# Patient Record
Sex: Female | Born: 1955 | Race: Black or African American | Hispanic: No | Marital: Single | Smoking: Never smoker
Health system: Southern US, Community
[De-identification: ages and names within clinical notes are randomized; demographics above are authoritative.]

## PROBLEM LIST (undated history)

## (undated) DIAGNOSIS — M1990 Unspecified osteoarthritis, unspecified site: Secondary | ICD-10-CM

## (undated) DIAGNOSIS — I10 Essential (primary) hypertension: Secondary | ICD-10-CM

## (undated) DIAGNOSIS — E119 Type 2 diabetes mellitus without complications: Secondary | ICD-10-CM

## (undated) HISTORY — PX: BREAST BIOPSY: SHX20

## (undated) HISTORY — PX: EYE SURGERY: SHX253

## (undated) HISTORY — PX: CHOLECYSTECTOMY: SHX55

## (undated) HISTORY — PX: HERNIA REPAIR: SHX51

---

## 1998-07-25 ENCOUNTER — Other Ambulatory Visit: Admission: RE | Admit: 1998-07-25 | Discharge: 1998-07-25 | Payer: Self-pay | Admitting: Obstetrics & Gynecology

## 2005-06-15 ENCOUNTER — Other Ambulatory Visit: Admission: RE | Admit: 2005-06-15 | Discharge: 2005-06-15 | Payer: Self-pay | Admitting: Internal Medicine

## 2006-06-17 ENCOUNTER — Other Ambulatory Visit: Admission: RE | Admit: 2006-06-17 | Discharge: 2006-06-17 | Payer: Self-pay | Admitting: Internal Medicine

## 2006-12-13 ENCOUNTER — Encounter: Admission: RE | Admit: 2006-12-13 | Discharge: 2006-12-13 | Payer: Self-pay | Admitting: Internal Medicine

## 2007-06-22 ENCOUNTER — Encounter: Admission: RE | Admit: 2007-06-22 | Discharge: 2007-06-22 | Payer: Self-pay | Admitting: Internal Medicine

## 2007-07-11 ENCOUNTER — Other Ambulatory Visit: Admission: RE | Admit: 2007-07-11 | Discharge: 2007-07-11 | Payer: Self-pay | Admitting: Internal Medicine

## 2008-06-25 ENCOUNTER — Encounter: Admission: RE | Admit: 2008-06-25 | Discharge: 2008-06-25 | Payer: Self-pay | Admitting: Internal Medicine

## 2008-07-11 ENCOUNTER — Other Ambulatory Visit: Admission: RE | Admit: 2008-07-11 | Discharge: 2008-07-11 | Payer: Self-pay | Admitting: Internal Medicine

## 2009-06-26 ENCOUNTER — Encounter: Admission: RE | Admit: 2009-06-26 | Discharge: 2009-06-26 | Payer: Self-pay | Admitting: Internal Medicine

## 2010-07-03 ENCOUNTER — Encounter: Admission: RE | Admit: 2010-07-03 | Discharge: 2010-07-03 | Payer: Self-pay | Admitting: Internal Medicine

## 2010-07-22 ENCOUNTER — Other Ambulatory Visit: Admission: RE | Admit: 2010-07-22 | Discharge: 2010-07-22 | Payer: Self-pay | Admitting: Internal Medicine

## 2011-05-16 ENCOUNTER — Inpatient Hospital Stay (INDEPENDENT_AMBULATORY_CARE_PROVIDER_SITE_OTHER)
Admission: RE | Admit: 2011-05-16 | Discharge: 2011-05-16 | Disposition: A | Discharge status: Home / Self Care | Payer: Self-pay | Source: Ambulatory Visit | Attending: Emergency Medicine | Admitting: Emergency Medicine

## 2011-05-16 ENCOUNTER — Ambulatory Visit (INDEPENDENT_AMBULATORY_CARE_PROVIDER_SITE_OTHER): Payer: Self-pay

## 2011-05-16 DIAGNOSIS — S7000XA Contusion of unspecified hip, initial encounter: Secondary | ICD-10-CM

## 2011-05-16 DIAGNOSIS — W19XXXA Unspecified fall, initial encounter: Secondary | ICD-10-CM

## 2011-06-22 ENCOUNTER — Other Ambulatory Visit: Payer: Self-pay | Admitting: Internal Medicine

## 2011-06-22 DIAGNOSIS — Z1231 Encounter for screening mammogram for malignant neoplasm of breast: Secondary | ICD-10-CM

## 2011-07-28 ENCOUNTER — Ambulatory Visit
Admission: RE | Admit: 2011-07-28 | Discharge: 2011-07-28 | Disposition: A | Discharge status: Home / Self Care | Payer: 59 | Source: Ambulatory Visit | Attending: Internal Medicine | Admitting: Internal Medicine

## 2011-07-28 DIAGNOSIS — Z1231 Encounter for screening mammogram for malignant neoplasm of breast: Secondary | ICD-10-CM

## 2012-06-19 ENCOUNTER — Other Ambulatory Visit: Payer: Self-pay | Admitting: Internal Medicine

## 2012-06-19 DIAGNOSIS — Z1231 Encounter for screening mammogram for malignant neoplasm of breast: Secondary | ICD-10-CM

## 2012-07-28 ENCOUNTER — Ambulatory Visit
Admission: RE | Admit: 2012-07-28 | Discharge: 2012-07-28 | Disposition: A | Discharge status: Home / Self Care | Payer: 59 | Source: Ambulatory Visit | Attending: Internal Medicine | Admitting: Internal Medicine

## 2012-07-28 DIAGNOSIS — Z1231 Encounter for screening mammogram for malignant neoplasm of breast: Secondary | ICD-10-CM

## 2012-08-17 ENCOUNTER — Other Ambulatory Visit: Payer: Self-pay | Admitting: Internal Medicine

## 2012-08-17 DIAGNOSIS — R928 Other abnormal and inconclusive findings on diagnostic imaging of breast: Secondary | ICD-10-CM

## 2012-08-25 ENCOUNTER — Other Ambulatory Visit: Payer: Self-pay | Admitting: Internal Medicine

## 2012-08-25 ENCOUNTER — Ambulatory Visit
Admission: RE | Admit: 2012-08-25 | Discharge: 2012-08-25 | Disposition: A | Discharge status: Home / Self Care | Payer: 59 | Source: Ambulatory Visit | Attending: Internal Medicine | Admitting: Internal Medicine

## 2012-08-25 DIAGNOSIS — R928 Other abnormal and inconclusive findings on diagnostic imaging of breast: Secondary | ICD-10-CM

## 2012-09-07 ENCOUNTER — Ambulatory Visit
Admission: RE | Admit: 2012-09-07 | Discharge: 2012-09-07 | Disposition: A | Discharge status: Home / Self Care | Payer: 59 | Source: Ambulatory Visit | Attending: Internal Medicine | Admitting: Internal Medicine

## 2012-09-07 ENCOUNTER — Other Ambulatory Visit: Payer: Self-pay | Admitting: Diagnostic Radiology

## 2012-09-07 DIAGNOSIS — R928 Other abnormal and inconclusive findings on diagnostic imaging of breast: Secondary | ICD-10-CM

## 2013-07-31 ENCOUNTER — Other Ambulatory Visit: Payer: Self-pay

## 2013-07-31 DIAGNOSIS — Z1231 Encounter for screening mammogram for malignant neoplasm of breast: Secondary | ICD-10-CM

## 2013-09-03 ENCOUNTER — Ambulatory Visit
Admission: RE | Admit: 2013-09-03 | Discharge: 2013-09-03 | Disposition: A | Discharge status: Home / Self Care | Payer: 59 | Source: Ambulatory Visit

## 2013-09-03 ENCOUNTER — Other Ambulatory Visit: Payer: Self-pay | Admitting: Internal Medicine

## 2013-09-03 ENCOUNTER — Other Ambulatory Visit (HOSPITAL_COMMUNITY)
Admission: RE | Admit: 2013-09-03 | Discharge: 2013-09-03 | Disposition: A | Discharge status: Home / Self Care | Payer: 59 | Source: Ambulatory Visit | Attending: Internal Medicine | Admitting: Internal Medicine

## 2013-09-03 DIAGNOSIS — Z1231 Encounter for screening mammogram for malignant neoplasm of breast: Secondary | ICD-10-CM

## 2013-09-03 DIAGNOSIS — Z01419 Encounter for gynecological examination (general) (routine) without abnormal findings: Secondary | ICD-10-CM | POA: Insufficient documentation

## 2013-09-03 DIAGNOSIS — Z1151 Encounter for screening for human papillomavirus (HPV): Secondary | ICD-10-CM | POA: Insufficient documentation

## 2013-09-04 ENCOUNTER — Ambulatory Visit: Payer: 59

## 2014-08-08 ENCOUNTER — Other Ambulatory Visit: Payer: Self-pay

## 2014-08-08 DIAGNOSIS — Z1231 Encounter for screening mammogram for malignant neoplasm of breast: Secondary | ICD-10-CM

## 2014-09-05 ENCOUNTER — Ambulatory Visit
Admission: RE | Admit: 2014-09-05 | Discharge: 2014-09-05 | Disposition: A | Discharge status: Home / Self Care | Payer: 59 | Source: Ambulatory Visit

## 2014-09-05 DIAGNOSIS — Z1231 Encounter for screening mammogram for malignant neoplasm of breast: Secondary | ICD-10-CM

## 2015-08-11 ENCOUNTER — Other Ambulatory Visit: Payer: Self-pay

## 2015-08-11 DIAGNOSIS — Z1231 Encounter for screening mammogram for malignant neoplasm of breast: Secondary | ICD-10-CM

## 2015-09-08 ENCOUNTER — Ambulatory Visit
Admission: RE | Admit: 2015-09-08 | Discharge: 2015-09-08 | Disposition: A | Discharge status: Home / Self Care | Payer: 59 | Source: Ambulatory Visit

## 2015-09-08 DIAGNOSIS — Z1231 Encounter for screening mammogram for malignant neoplasm of breast: Secondary | ICD-10-CM

## 2015-10-23 ENCOUNTER — Other Ambulatory Visit: Payer: Self-pay | Admitting: Gastroenterology

## 2015-12-02 ENCOUNTER — Ambulatory Visit (HOSPITAL_COMMUNITY): Admission: RE | Admit: 2015-12-02 | Payer: 59 | Source: Ambulatory Visit | Admitting: Gastroenterology

## 2015-12-02 ENCOUNTER — Encounter (HOSPITAL_COMMUNITY): Admission: RE | Payer: Self-pay | Source: Ambulatory Visit

## 2015-12-02 SURGERY — COLONOSCOPY WITH PROPOFOL
Anesthesia: Monitor Anesthesia Care

## 2016-08-03 ENCOUNTER — Other Ambulatory Visit: Payer: Self-pay | Admitting: Internal Medicine

## 2016-08-03 DIAGNOSIS — Z1231 Encounter for screening mammogram for malignant neoplasm of breast: Secondary | ICD-10-CM

## 2016-09-09 ENCOUNTER — Ambulatory Visit
Admission: RE | Admit: 2016-09-09 | Discharge: 2016-09-09 | Disposition: A | Discharge status: Home / Self Care | Payer: 59 | Source: Ambulatory Visit | Attending: Internal Medicine | Admitting: Internal Medicine

## 2016-09-09 DIAGNOSIS — Z1231 Encounter for screening mammogram for malignant neoplasm of breast: Secondary | ICD-10-CM

## 2016-10-19 ENCOUNTER — Other Ambulatory Visit: Payer: Self-pay | Admitting: Internal Medicine

## 2016-10-19 ENCOUNTER — Other Ambulatory Visit (HOSPITAL_COMMUNITY)
Admission: RE | Admit: 2016-10-19 | Discharge: 2016-10-19 | Disposition: A | Discharge status: Home / Self Care | Payer: 59 | Source: Ambulatory Visit | Attending: Internal Medicine | Admitting: Internal Medicine

## 2016-10-19 DIAGNOSIS — Z23 Encounter for immunization: Secondary | ICD-10-CM | POA: Diagnosis not present

## 2016-10-19 DIAGNOSIS — Z124 Encounter for screening for malignant neoplasm of cervix: Secondary | ICD-10-CM | POA: Diagnosis present

## 2016-10-19 DIAGNOSIS — E1165 Type 2 diabetes mellitus with hyperglycemia: Secondary | ICD-10-CM | POA: Diagnosis not present

## 2016-10-19 DIAGNOSIS — Z1151 Encounter for screening for human papillomavirus (HPV): Secondary | ICD-10-CM | POA: Diagnosis present

## 2016-10-19 DIAGNOSIS — Z0000 Encounter for general adult medical examination without abnormal findings: Secondary | ICD-10-CM | POA: Diagnosis not present

## 2016-10-22 LAB — CYTOLOGY - PAP
Diagnosis: NEGATIVE
HPV (WINDOPATH): NOT DETECTED

## 2016-11-02 ENCOUNTER — Other Ambulatory Visit: Payer: Self-pay | Admitting: Gastroenterology

## 2016-11-25 DIAGNOSIS — E1165 Type 2 diabetes mellitus with hyperglycemia: Secondary | ICD-10-CM | POA: Diagnosis not present

## 2016-11-25 DIAGNOSIS — Z800 Family history of malignant neoplasm of digestive organs: Secondary | ICD-10-CM | POA: Diagnosis not present

## 2016-11-25 DIAGNOSIS — Z1211 Encounter for screening for malignant neoplasm of colon: Secondary | ICD-10-CM | POA: Diagnosis not present

## 2016-11-30 DIAGNOSIS — H25812 Combined forms of age-related cataract, left eye: Secondary | ICD-10-CM | POA: Diagnosis not present

## 2016-11-30 DIAGNOSIS — E119 Type 2 diabetes mellitus without complications: Secondary | ICD-10-CM | POA: Diagnosis not present

## 2016-11-30 DIAGNOSIS — H2701 Aphakia, right eye: Secondary | ICD-10-CM | POA: Diagnosis not present

## 2016-12-07 ENCOUNTER — Ambulatory Visit (HOSPITAL_COMMUNITY): Admit: 2016-12-07 | Payer: 59 | Admitting: Gastroenterology

## 2016-12-07 ENCOUNTER — Encounter (HOSPITAL_COMMUNITY): Payer: Self-pay

## 2016-12-07 SURGERY — COLONOSCOPY WITH PROPOFOL
Anesthesia: Monitor Anesthesia Care

## 2017-01-05 DIAGNOSIS — Z800 Family history of malignant neoplasm of digestive organs: Secondary | ICD-10-CM | POA: Diagnosis not present

## 2017-01-05 DIAGNOSIS — Z1211 Encounter for screening for malignant neoplasm of colon: Secondary | ICD-10-CM | POA: Diagnosis not present

## 2017-01-05 DIAGNOSIS — D126 Benign neoplasm of colon, unspecified: Secondary | ICD-10-CM | POA: Diagnosis not present

## 2017-01-05 DIAGNOSIS — K635 Polyp of colon: Secondary | ICD-10-CM | POA: Diagnosis not present

## 2017-04-20 DIAGNOSIS — I10 Essential (primary) hypertension: Secondary | ICD-10-CM | POA: Diagnosis not present

## 2017-04-20 DIAGNOSIS — E1165 Type 2 diabetes mellitus with hyperglycemia: Secondary | ICD-10-CM | POA: Diagnosis not present

## 2017-04-20 DIAGNOSIS — E782 Mixed hyperlipidemia: Secondary | ICD-10-CM | POA: Diagnosis not present

## 2017-06-21 ENCOUNTER — Other Ambulatory Visit: Payer: Self-pay | Admitting: Internal Medicine

## 2017-06-21 DIAGNOSIS — Z1231 Encounter for screening mammogram for malignant neoplasm of breast: Secondary | ICD-10-CM

## 2017-09-13 ENCOUNTER — Ambulatory Visit
Admission: RE | Admit: 2017-09-13 | Discharge: 2017-09-13 | Disposition: A | Discharge status: Home / Self Care | Payer: 59 | Source: Ambulatory Visit | Attending: Internal Medicine | Admitting: Internal Medicine

## 2017-09-13 DIAGNOSIS — Z1231 Encounter for screening mammogram for malignant neoplasm of breast: Secondary | ICD-10-CM

## 2017-10-20 DIAGNOSIS — Z0000 Encounter for general adult medical examination without abnormal findings: Secondary | ICD-10-CM | POA: Diagnosis not present

## 2017-10-20 DIAGNOSIS — I10 Essential (primary) hypertension: Secondary | ICD-10-CM | POA: Diagnosis not present

## 2017-10-20 DIAGNOSIS — Z13220 Encounter for screening for lipoid disorders: Secondary | ICD-10-CM | POA: Diagnosis not present

## 2017-10-20 DIAGNOSIS — E1165 Type 2 diabetes mellitus with hyperglycemia: Secondary | ICD-10-CM | POA: Diagnosis not present

## 2017-10-23 DIAGNOSIS — Z0000 Encounter for general adult medical examination without abnormal findings: Secondary | ICD-10-CM | POA: Diagnosis not present

## 2017-12-01 DIAGNOSIS — H401122 Primary open-angle glaucoma, left eye, moderate stage: Secondary | ICD-10-CM | POA: Diagnosis not present

## 2017-12-01 DIAGNOSIS — E119 Type 2 diabetes mellitus without complications: Secondary | ICD-10-CM | POA: Diagnosis not present

## 2017-12-01 DIAGNOSIS — H2701 Aphakia, right eye: Secondary | ICD-10-CM | POA: Diagnosis not present

## 2018-04-20 DIAGNOSIS — E7800 Pure hypercholesterolemia, unspecified: Secondary | ICD-10-CM | POA: Diagnosis not present

## 2018-04-20 DIAGNOSIS — E1169 Type 2 diabetes mellitus with other specified complication: Secondary | ICD-10-CM | POA: Diagnosis not present

## 2018-04-20 DIAGNOSIS — I10 Essential (primary) hypertension: Secondary | ICD-10-CM | POA: Diagnosis not present

## 2018-09-28 ENCOUNTER — Other Ambulatory Visit: Payer: Self-pay | Admitting: Internal Medicine

## 2018-09-28 DIAGNOSIS — Z1231 Encounter for screening mammogram for malignant neoplasm of breast: Secondary | ICD-10-CM

## 2018-09-29 ENCOUNTER — Ambulatory Visit
Admission: RE | Admit: 2018-09-29 | Discharge: 2018-09-29 | Disposition: A | Discharge status: Home / Self Care | Payer: 59 | Source: Ambulatory Visit | Attending: Internal Medicine | Admitting: Internal Medicine

## 2018-09-29 DIAGNOSIS — Z1231 Encounter for screening mammogram for malignant neoplasm of breast: Secondary | ICD-10-CM | POA: Diagnosis not present

## 2018-11-16 DIAGNOSIS — E1169 Type 2 diabetes mellitus with other specified complication: Secondary | ICD-10-CM | POA: Diagnosis not present

## 2018-11-16 DIAGNOSIS — I10 Essential (primary) hypertension: Secondary | ICD-10-CM | POA: Diagnosis not present

## 2018-12-06 DIAGNOSIS — H2701 Aphakia, right eye: Secondary | ICD-10-CM | POA: Diagnosis not present

## 2018-12-06 DIAGNOSIS — H25812 Combined forms of age-related cataract, left eye: Secondary | ICD-10-CM | POA: Diagnosis not present

## 2018-12-06 DIAGNOSIS — H402222 Chronic angle-closure glaucoma, left eye, moderate stage: Secondary | ICD-10-CM | POA: Diagnosis not present

## 2018-12-12 DIAGNOSIS — H402222 Chronic angle-closure glaucoma, left eye, moderate stage: Secondary | ICD-10-CM | POA: Diagnosis not present

## 2019-01-18 DIAGNOSIS — R222 Localized swelling, mass and lump, trunk: Secondary | ICD-10-CM | POA: Diagnosis not present

## 2019-01-19 DIAGNOSIS — R222 Localized swelling, mass and lump, trunk: Secondary | ICD-10-CM | POA: Diagnosis not present

## 2019-08-08 ENCOUNTER — Other Ambulatory Visit: Payer: Self-pay | Admitting: Internal Medicine

## 2019-08-08 DIAGNOSIS — Z1231 Encounter for screening mammogram for malignant neoplasm of breast: Secondary | ICD-10-CM

## 2019-08-29 ENCOUNTER — Other Ambulatory Visit: Payer: Self-pay

## 2019-08-29 DIAGNOSIS — Z20822 Contact with and (suspected) exposure to covid-19: Secondary | ICD-10-CM

## 2019-09-01 LAB — NOVEL CORONAVIRUS, NAA: SARS-CoV-2, NAA: NOT DETECTED

## 2019-10-02 ENCOUNTER — Ambulatory Visit: Payer: 59

## 2019-10-19 ENCOUNTER — Ambulatory Visit
Admission: RE | Admit: 2019-10-19 | Discharge: 2019-10-19 | Disposition: A | Discharge status: Home / Self Care | Payer: Medicare Other | Source: Ambulatory Visit | Attending: Internal Medicine | Admitting: Internal Medicine

## 2019-10-19 ENCOUNTER — Other Ambulatory Visit: Payer: Self-pay

## 2019-10-19 DIAGNOSIS — Z1231 Encounter for screening mammogram for malignant neoplasm of breast: Secondary | ICD-10-CM

## 2020-10-08 ENCOUNTER — Other Ambulatory Visit: Payer: Self-pay | Admitting: Internal Medicine

## 2020-10-08 DIAGNOSIS — Z1231 Encounter for screening mammogram for malignant neoplasm of breast: Secondary | ICD-10-CM

## 2020-11-25 ENCOUNTER — Other Ambulatory Visit: Payer: Self-pay

## 2020-11-25 ENCOUNTER — Ambulatory Visit
Admission: RE | Admit: 2020-11-25 | Discharge: 2020-11-25 | Disposition: A | Discharge status: Home / Self Care | Payer: Medicare Other | Source: Ambulatory Visit | Attending: Internal Medicine | Admitting: Internal Medicine

## 2020-11-25 DIAGNOSIS — Z1231 Encounter for screening mammogram for malignant neoplasm of breast: Secondary | ICD-10-CM

## 2021-01-06 ENCOUNTER — Ambulatory Visit
Admission: RE | Admit: 2021-01-06 | Discharge: 2021-01-06 | Disposition: A | Discharge status: Home / Self Care | Payer: Medicare Other | Source: Ambulatory Visit | Attending: Internal Medicine | Admitting: Internal Medicine

## 2021-01-06 ENCOUNTER — Other Ambulatory Visit: Payer: Self-pay | Admitting: Internal Medicine

## 2021-01-06 DIAGNOSIS — M5441 Lumbago with sciatica, right side: Secondary | ICD-10-CM

## 2021-01-12 ENCOUNTER — Other Ambulatory Visit: Payer: Self-pay | Admitting: Internal Medicine

## 2021-01-12 DIAGNOSIS — M5442 Lumbago with sciatica, left side: Secondary | ICD-10-CM

## 2021-01-14 ENCOUNTER — Ambulatory Visit
Admission: RE | Admit: 2021-01-14 | Discharge: 2021-01-14 | Disposition: A | Discharge status: Home / Self Care | Payer: Medicare Other | Source: Ambulatory Visit | Attending: Internal Medicine | Admitting: Internal Medicine

## 2021-01-14 DIAGNOSIS — M5442 Lumbago with sciatica, left side: Secondary | ICD-10-CM

## 2021-02-17 ENCOUNTER — Ambulatory Visit: Payer: Medicare Other | Attending: Neurosurgery | Admitting: Physical Therapy

## 2021-02-17 ENCOUNTER — Other Ambulatory Visit: Payer: Self-pay

## 2021-02-17 ENCOUNTER — Encounter: Payer: Self-pay | Admitting: Physical Therapy

## 2021-02-17 DIAGNOSIS — M6281 Muscle weakness (generalized): Secondary | ICD-10-CM | POA: Diagnosis present

## 2021-02-17 DIAGNOSIS — G8929 Other chronic pain: Secondary | ICD-10-CM

## 2021-02-17 DIAGNOSIS — M5442 Lumbago with sciatica, left side: Secondary | ICD-10-CM | POA: Insufficient documentation

## 2021-02-17 DIAGNOSIS — M5441 Lumbago with sciatica, right side: Secondary | ICD-10-CM | POA: Insufficient documentation

## 2021-02-17 DIAGNOSIS — R262 Difficulty in walking, not elsewhere classified: Secondary | ICD-10-CM

## 2021-02-17 DIAGNOSIS — R252 Cramp and spasm: Secondary | ICD-10-CM | POA: Diagnosis present

## 2021-02-17 NOTE — Patient Instructions (Signed)
Access Code: [email or web address] URL: [email or web address] Date: 02/17/2021 Prepared by: Amador Cunas  Exercises Supine Posterior Pelvic Tilt - 1 x daily - 7 x weekly - 2 sets - 10 reps - 3 sec hold Supine Bridge - 1 x daily - 7 x weekly - 3 sets - 10 reps - 3 sec hold Hooklying Clamshell with Resistance - 1 x daily - 7 x weekly - 3 sets - 10 reps Supine 90/90 Alternating Toe Touch - 1 x daily - 7 x weekly - 3 sets - 10 reps

## 2021-02-17 NOTE — Therapy (Signed)
[place] [address]. [place], [place], [address] Phone: [phone]   Fax:  [phone]  Physical Therapy Evaluation  Patient Details  Name: Olivia Shah MRN: [id number] Date of Birth: 03-26-1956 Referring Provider (PT): Rico Ala Date: 02/17/2021   PT End of Session - 02/17/21 0915    Visit Number 1    Date for PT Re-Evaluation 04/19/21    PT Start Time 0837    PT Stop Time 0915    PT Time Calculation (min) 38 min    Activity Tolerance Patient tolerated treatment well    Behavior During Therapy [facility] for tasks assessed/performed           History reviewed. No pertinent past medical history.  Past Surgical History:  Procedure Laterality Date  . BREAST BIOPSY Right     There were no vitals filed for this visit.    Subjective Assessment - 02/17/21 0838    Subjective Pt reports that she has had LBP since last summer 2021 on a trip to [place]; did a lot of walking and has been having back pain since then. Pt reports that back pain feels sore and tight when she walks almost like she has been working out. States she has intermittent radiating pain in BLE, denies N/T and weakness. Pt reports that back pain bothers her most when up and walking, states no trouble with sitting. 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 Had MRI which she reports showed arthritis and spinal stenosis. States she would like to avoid surgery. Managing pain with ibuprofen and tylenol.    Pertinent History HTN    Limitations Standing;Walking    How long can you sit comfortably? unlimited    Diagnostic tests MRI    Patient Stated Goals get rid of the pain    Currently in Pain? Yes    Pain Score 4     Pain Location Back    Pain Orientation Lower    Pain Descriptors / Indicators Tightness    Pain Type Chronic pain    Pain Radiating Towards posterior  BLE    Pain Onset More than a month ago    Pain Frequency Constant    Aggravating Factors  prolonged standing/walking    Pain Relieving Factors sitting, rest              [facility] PT Assessment - 02/17/21 0001      Assessment   Medical Diagnosis LBP with radiating pain    Referring Provider (PT) Saintclair Halsted    Onset Date/Surgical Date --   July 2021   Next MD Visit --   early June 2022   Prior Therapy none      Precautions   Precautions None      Restrictions   Weight Bearing Restrictions No      Balance Screen   Has the patient fallen in the past 6 months No    Has the patient had a decrease in activity level because of a fear of falling?  No    Is the patient reluctant to leave their home because of a fear of falling?  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; mostly sitting, some walking around [employer]    Leisure walking      Observation/Other Assessments   Focus on Therapeutic Outcomes (FOTO)  53%      Sensation   Light Touch Appears Intact      Functional Tests   Functional tests Sit to Stand      Sit to Stand   Comments some difficulty with eccentric control      Posture/Postural Control   Posture/Postural Control Postural limitations    Postural Limitations Rounded Shoulders;Forward head      ROM / Strength   AROM / PROM / Strength AROM;Strength      AROM   Overall AROM Comments cervical AROM WFL but endorses some pain with cervical rotation    AROM Assessment Site Lumbar    Lumbar Flexion WFL, reports relief of pain    Lumbar Extension 75% limited    Lumbar - Right Side Bend 25% limited    Lumbar - Left Side Bend 25% limited    Lumbar - Right Rotation 50% limited    Lumbar - Left Rotation 50% limited      Strength    Strength Assessment Site Hip;Knee    Right/Left Hip Right;Left    Right Hip Flexion 5/5    Right Hip Extension 4/5    Right Hip ABduction 4/5    Left Hip Flexion 5/5    Left Hip Extension 4/5    Left Hip ABduction 4/5    Right/Left Knee Right;Left    Right Knee Flexion 5/5    Right Knee Extension 5/5    Left Knee Flexion 5/5    Left Knee Extension 5/5      Flexibility   Soft Tissue Assessment /Muscle Length yes    Hamstrings WFL    ITB WFL    Piriformis WFL      Palpation   Palpation comment ttp lumbar paraspinals      Ambulation/Gait   Gait Comments antalgic gait with widened BOS and decreased step length                      Objective measurements completed on examination: See above findings.       [place] Adult PT Treatment/Exercise - 02/17/21 0001      Exercises   Exercises Lumbar      Lumbar Exercises: Supine   Pelvic Tilt 10 reps    Bridge 10 reps    Bridge Limitations with PPT    Other Supine Lumbar Exercises clamshell with red TB    Other Supine Lumbar Exercises supine marching                  PT Education - 02/17/21 0915    Education Details Pt educated on POC and HEP    Person(s) Educated Patient    Methods Explanation;Demonstration;Handout    Comprehension Verbalized understanding;Returned demonstration            PT Short Term Goals - 02/17/21 0955      PT SHORT TERM GOAL #1   Title Pt will be I with initial HEP    Time 2    Period Weeks    Status New    Target Date 03/03/21             PT Long Term Goals - 02/17/21 0955      PT LONG TERM  GOAL #1   Title Pt will report 50% reduction in LBP    Time 8    Period Weeks    Status New    Target Date 04/14/21      PT LONG TERM GOAL #2   Title Pt will report resolution of BLE radiating pain    Time 8    Period Weeks    Status New    Target Date 04/14/21      PT LONG TERM GOAL #3   Title Pt will demo lumbar AROM <25% limited with no increase in LBP     Time 8    Period Weeks    Status New    Target Date 04/14/21      PT LONG TERM GOAL #4   Title Pt will report able to walk >20 minutes with no increase in LBP    Time 8    Period Weeks    Status New    Target Date 04/14/21      PT LONG TERM GOAL #5   Title Pt will demo increase in FOTO score to 68%    Baseline 53%    Time 8    Period Weeks    Status New    Target Date 04/14/21                  Plan - 02/17/21 0932    Clinical Impression Statement Pt presents to clinic with reports of chronic LBP with BLE radiating pain present since last July 2021. Pt MRI demos mod-severe stenosis T10-L4 with some signal changes at the cord level. MD recommended potential decompressive laminectomy; pt would like to avoid surgery if possible. Intact sensation BLE, no reports of weakness in LEs. Does demo hip abd/ext weakness, otherwise WFL BLE MMT. Lumbar flexion WFL, mod limitations otherwise in lumbar AROM with extension most limited. Demos core weakness/instability. Also notes occasional pain with cervical rotation. Pt would benefit from skilled PT to address the above impairments.    Personal Factors and Comorbidities Comorbidity 1    Comorbidities HTN    Examination-Activity Limitations Stairs;Stand;Lift;Locomotion Level    Examination-Participation Restrictions Community Activity;Interpersonal Relationship    Stability/Clinical Decision Making Stable/Uncomplicated    Clinical Decision Making Low    Rehab Potential Good    PT Frequency 2x / week    PT Duration 8 weeks    PT Treatment/Interventions ADLs/Self Care Home Management;Electrical Stimulation;Iontophoresis 4mg /ml Dexamethasone;Moist Heat;Neuromuscular re-education;Balance training;Therapeutic exercise;Therapeutic activities;Functional mobility training;Stair training;Gait training;Patient/family education;Manual techniques;Dry needling;Passive range of motion    PT Next Visit Plan review/progress HEP, gentle progression to TE,  core/lumbar stab, hip strengthening, manual/modalities as indicated    PT Home Exercise Plan see pt instructions    Consulted and Agree with Plan of Care Patient           Patient will benefit from skilled therapeutic intervention in order to improve the following deficits and impairments:  Abnormal gait,Decreased range of motion,Difficulty walking,Decreased endurance,Increased muscle spasms,Decreased activity tolerance,Pain,Decreased mobility,Decreased strength  Visit Diagnosis: Chronic bilateral low back pain with bilateral sciatica  Difficulty in walking, not elsewhere classified  Muscle weakness (generalized)  Cramp and spasm     Problem List There are no problems to display for this patient.  Amador Cunas, PT, DPT Donald Prose Lynisha Osuch 02/17/2021, 10:07 AM  [place]. [place], [place], [address] Phone: [phone]   Fax:  [phone]  Name: Olivia Shah MRN: [id number] Date of Birth: [birth year]

## 2021-03-03 ENCOUNTER — Ambulatory Visit: Payer: Medicare Other | Attending: Neurosurgery | Admitting: Physical Therapy

## 2021-03-03 ENCOUNTER — Other Ambulatory Visit: Payer: Self-pay

## 2021-03-03 ENCOUNTER — Encounter: Payer: Self-pay | Admitting: Physical Therapy

## 2021-03-03 DIAGNOSIS — R252 Cramp and spasm: Secondary | ICD-10-CM | POA: Diagnosis not present

## 2021-03-03 DIAGNOSIS — G8929 Other chronic pain: Secondary | ICD-10-CM | POA: Insufficient documentation

## 2021-03-03 DIAGNOSIS — M5442 Lumbago with sciatica, left side: Secondary | ICD-10-CM | POA: Insufficient documentation

## 2021-03-03 DIAGNOSIS — M6281 Muscle weakness (generalized): Secondary | ICD-10-CM | POA: Diagnosis present

## 2021-03-03 DIAGNOSIS — R262 Difficulty in walking, not elsewhere classified: Secondary | ICD-10-CM | POA: Insufficient documentation

## 2021-03-03 DIAGNOSIS — M5441 Lumbago with sciatica, right side: Secondary | ICD-10-CM | POA: Diagnosis present

## 2021-03-03 NOTE — Therapy (Signed)
[place]. [place], [place], [address] Phone: [phone]   Fax:  [phone]  Physical Therapy Treatment  Patient Details  Name: Olivia Shah MRN: [id number] Date of Birth: March 30, 1956 Referring Provider (PT): Rico Ala Date: 03/03/2021   PT End of Session - 03/03/21 1009    Visit Number 2    Date for PT Re-Evaluation 04/19/21    PT Start Time 0928    PT Stop Time 1010    PT Time Calculation (min) 42 min    Activity Tolerance Patient tolerated treatment well    Behavior During Therapy [facility] for tasks assessed/performed           History reviewed. No pertinent past medical history.  Past Surgical History:  Procedure Laterality Date  . BREAST BIOPSY Right     There were no vitals filed for this visit.   Subjective Assessment - 03/03/21 0929    Subjective "So, so"    Currently in Pain? No/denies                             [facility] Adult PT Treatment/Exercise - 03/03/21 0001      Lumbar Exercises: Stretches   Passive Hamstring Stretch Left;Right;3 reps;10 seconds;20 seconds    Single Knee to Chest Stretch Left;Right;2 reps;10 seconds    Lower Trunk Rotation 1 rep;10 seconds    Piriformis Stretch Right;Left;2 reps;10 seconds      Lumbar Exercises: Aerobic   Nustep L3 x 6 min      Lumbar Exercises: Standing   Row Theraband;20 reps;Strengthening;Both    Theraband Level (Row) Level 2 (Red)    Shoulder Extension Theraband;20 reps;Both;Strengthening    Theraband Level (Shoulder Extension) Level 2 (Red)      Lumbar Exercises: Seated   Long Arc Quad on Chair Strengthening;Both;2 sets;10 reps    LAQ on Chair Weights (lbs) 2    Sit to Stand 5 reps   x2   Other Seated Lumbar Exercises HS curls red 2x10      Lumbar Exercises: Supine   Bridge 10 reps                    PT Short Term Goals - 03/03/21 1010      PT SHORT TERM GOAL #1   Title Pt will be I with  initial HEP    Status Partially Met             PT Long Term Goals - 02/17/21 0955      PT LONG TERM GOAL #1   Title Pt will report 50% reduction in LBP    Time 8    Period Weeks    Status New    Target Date 04/14/21      PT LONG TERM GOAL #2   Title Pt will report resolution of BLE radiating pain    Time 8    Period Weeks    Status New    Target Date 04/14/21      PT LONG TERM GOAL #3   Title Pt will demo lumbar AROM <25% limited with no increase in LBP    Time 8    Period Weeks    Status New    Target Date 04/14/21      PT LONG TERM GOAL #4   Title Pt will report able to walk >20 minutes with no increase in LBP  Time 8    Period Weeks    Status New    Target Date 04/14/21      PT LONG TERM GOAL #5   Title Pt will demo increase in FOTO score to 68%    Baseline 53%    Time 8    Period Weeks    Status New    Target Date 04/14/21                 Plan - 03/03/21 1010    Clinical Impression Statement Pt tolerated an initial progression well. Postural cues needed with standing rows and extension, cues also needed for pacing not to perform exercises too fast. Pt has bilateral knee valgus with sit to stands and with gait. Some difficulty noted with sit to stand not using UE. Weakness and little ROM noted with supine bridges.    Personal Factors and Comorbidities Comorbidity 1    Comorbidities HTN    Examination-Activity Limitations Stairs;Stand;Lift;Locomotion Level    Examination-Participation Restrictions Community Activity;Interpersonal Relationship    Stability/Clinical Decision Making Stable/Uncomplicated    Rehab Potential Good    PT Frequency 2x / week    PT Duration 8 weeks    PT Treatment/Interventions ADLs/Self Care Home Management;Electrical Stimulation;Iontophoresis 35m/ml Dexamethasone;Moist Heat;Neuromuscular re-education;Balance training;Therapeutic exercise;Therapeutic activities;Functional mobility training;Stair training;Gait  training;Patient/family education;Manual techniques;Dry needling;Passive range of motion    PT Next Visit Plan gentle progression to TE, core/lumbar stab, hip strengthening, manual/modalities as indicated           Patient will benefit from skilled therapeutic intervention in order to improve the following deficits and impairments:  Abnormal gait,Decreased range of motion,Difficulty walking,Decreased endurance,Increased muscle spasms,Decreased activity tolerance,Pain,Decreased mobility,Decreased strength  Visit Diagnosis: Cramp and spasm  Muscle weakness (generalized)  Difficulty in walking, not elsewhere classified  Chronic bilateral low back pain with bilateral sciatica     Problem List There are no problems to display for this patient.   RScot Jun6/03/2021, 10:13 AM  [place] [address]Phone: [phone]  Fax:  [phone] Name: Olivia OVERHOLTMRN: [id number] of Birth: 703-26-1957

## 2021-03-05 ENCOUNTER — Encounter: Payer: Self-pay | Admitting: Physical Therapy

## 2021-03-05 ENCOUNTER — Other Ambulatory Visit: Payer: Self-pay

## 2021-03-05 ENCOUNTER — Ambulatory Visit: Payer: Medicare Other | Admitting: Physical Therapy

## 2021-03-05 DIAGNOSIS — R262 Difficulty in walking, not elsewhere classified: Secondary | ICD-10-CM

## 2021-03-05 DIAGNOSIS — M5442 Lumbago with sciatica, left side: Secondary | ICD-10-CM

## 2021-03-05 DIAGNOSIS — R252 Cramp and spasm: Secondary | ICD-10-CM | POA: Diagnosis not present

## 2021-03-05 DIAGNOSIS — M6281 Muscle weakness (generalized): Secondary | ICD-10-CM

## 2021-03-05 DIAGNOSIS — G8929 Other chronic pain: Secondary | ICD-10-CM

## 2021-03-05 NOTE — Therapy (Signed)
[place]. [place], [place], [address] Phone: [phone]   Fax:  [phone]  Physical Therapy Treatment  Patient Details  Name: Olivia Shah MRN: [id number] Date of Birth: 05/20/1956 Referring Provider (PT): Rico Ala Date: 03/05/2021   PT End of Session - 03/05/21 1741     Visit Number 3    Date for PT Re-Evaluation 04/19/21    PT Start Time 1640    PT Stop Time 6962    PT Time Calculation (min) 47 min    Activity Tolerance Patient tolerated treatment well    Behavior During Therapy [facility] for tasks assessed/performed             History reviewed. No pertinent past medical history.  Past Surgical History:  Procedure Laterality Date   BREAST BIOPSY Right     There were no vitals filed for this visit.   Subjective Assessment - 03/05/21 1642     Subjective Patient reports that she felt really good after the last treatment, less back paina nd moving better, reports that today with sitting at work she has been having pain in the back of both thighs    Currently in Pain? Yes    Pain Score 7     Pain Location Leg    Pain Orientation Left;Right;Upper;Mid;Posterior    Pain Descriptors / Indicators Aching;Sore    Pain Relieving Factors the exercises seemed to help                               [facility] Adult PT Treatment/Exercise - 03/05/21 0001       Lumbar Exercises: Stretches   Active Hamstring Stretch Right;Left;1 rep;30 seconds    Piriformis Stretch Right;Left;2 reps;30 seconds    Piriformis Stretch Limitations seated    Other Lumbar Stretch Exercise seated fwd/lat flexion with pball x5 with 5 sec hold      Lumbar Exercises: Aerobic   Nustep level 4 x 6 minutes      Lumbar Exercises: Machines for Strengthening   Cybex Knee Extension 5# 1x10    Cybex Knee Flexion 15# 2x10      Lumbar Exercises: Standing   Heel Raises 15 reps    Heel Raises Limitations 3#    Row  Theraband;20 reps;Strengthening;Both    Theraband Level (Row) Level 2 (Red)    Shoulder Extension Theraband;20 reps;Both;Strengthening    Theraband Level (Shoulder Extension) Level 2 (Red)    Other Standing Lumbar Exercises standing hip abd/ext/flex 3# x10 BLE      Lumbar Exercises: Seated   Long Arc Quad on Chair Both;1 set;10 reps    LAQ on Chair Weights (lbs) 3    Sit to Stand 20 reps   2x10 with 3# dumbbell chest press and OHP   Other Seated Lumbar Exercises seated marches 2x10 3#    Other Seated Lumbar Exercises iso abs x15 3 sec hold with pball                      PT Short Term Goals - 03/03/21 1010       PT SHORT TERM GOAL #1   Title Pt will be I with initial HEP    Status Partially Met               PT Long Term Goals - 03/05/21 1723       PT LONG TERM  GOAL #1   Title Pt will report 50% reduction in LBP    Baseline reports 10% better    Time 8    Period Weeks    Status On-going      PT LONG TERM GOAL #2   Title Pt will report resolution of BLE radiating pain    Baseline reports still having BLE radiating pain to posterior thigh; stops at knees    Time 8    Period Weeks    Status On-going      PT LONG TERM GOAL #3   Title Pt will demo lumbar AROM <25% limited with no increase in LBP    Time 8    Period Weeks    Status On-going      PT LONG TERM GOAL #4   Title Pt will report able to walk >20 minutes with no increase in LBP    Baseline reports walking about 5 minutes now    Time 8    Period Weeks    Status On-going      PT LONG TERM GOAL #5   Title Pt will demo increase in FOTO score to 68%    Baseline 53%    Time 8    Period Weeks    Status New                   Plan - 03/05/21 1741     Clinical Impression Statement Pt tolerated progression of TE well. No c/o of increased LBP this rx. Did have mild increased knee pain with machine knee extensions, so modified to LAQ with ankle weights. Able to tolerate increased number  of reps/weight with STS. Cues to avoid compensation with standing hip ex's. Continue to progress to tolerance.    PT Treatment/Interventions ADLs/Self Care Home Management;Electrical Stimulation;Iontophoresis 64m/ml Dexamethasone;Moist Heat;Neuromuscular re-education;Balance training;Therapeutic exercise;Therapeutic activities;Functional mobility training;Stair training;Gait training;Patient/family education;Manual techniques;Dry needling;Passive range of motion    PT Next Visit Plan gentle progression of TE, core/lumbar stab, hip strengthening, manual/modalities as indicated    Consulted and Agree with Plan of Care Patient             Patient will benefit from skilled therapeutic intervention in order to improve the following deficits and impairments:  Abnormal gait, Decreased range of motion, Difficulty walking, Decreased endurance, Increased muscle spasms, Decreased activity tolerance, Pain, Decreased mobility, Decreased strength  Visit Diagnosis: Cramp and spasm  Muscle weakness (generalized)  Difficulty in walking, not elsewhere classified  Chronic bilateral low back pain with bilateral sciatica     Problem List There are no problems to display for this patient.  AAmador Cunas PT, DPT ADonald ProseSugg 03/05/2021, 5:46 PM  [place] [address]Phone: [phone]  Fax:  [phone] Name: Olivia CINQUEMANIMRN: [id number] of Birth: 7Jul 03, 1957

## 2021-03-10 ENCOUNTER — Ambulatory Visit: Payer: Medicare Other | Admitting: Physical Therapy

## 2021-03-10 ENCOUNTER — Encounter: Payer: Self-pay | Admitting: Physical Therapy

## 2021-03-10 ENCOUNTER — Other Ambulatory Visit: Payer: Self-pay

## 2021-03-10 DIAGNOSIS — M5442 Lumbago with sciatica, left side: Secondary | ICD-10-CM

## 2021-03-10 DIAGNOSIS — R252 Cramp and spasm: Secondary | ICD-10-CM | POA: Diagnosis not present

## 2021-03-10 DIAGNOSIS — G8929 Other chronic pain: Secondary | ICD-10-CM

## 2021-03-10 DIAGNOSIS — R262 Difficulty in walking, not elsewhere classified: Secondary | ICD-10-CM

## 2021-03-10 NOTE — Therapy (Signed)
[place]. [place], [place], [address] Phone: [phone]   Fax:  [phone]  Physical Therapy Treatment  Patient Details  Name: Olivia Shah MRN: [id number] Date of Birth: 1956-06-13 Referring Provider (PT): Saintclair Halsted   Encounter Date: 03/10/2021   PT End of Session - 03/10/21 1007     Visit Number 4    Date for PT Re-Evaluation 04/19/21    PT Start Time 0930    PT Stop Time 1011    PT Time Calculation (min) 41 min    Activity Tolerance Patient tolerated treatment well    Behavior During Therapy [facility] for tasks assessed/performed             History reviewed. No pertinent past medical history.  Past Surgical History:  Procedure Laterality Date   BREAST BIOPSY Right     There were no vitals filed for this visit.   Subjective Assessment - 03/10/21 0933     Subjective Doing ok, feels a stretch on the back of her legs.    Currently in Pain? No/denies                               [facility] Adult PT Treatment/Exercise - 03/10/21 0001       Lumbar Exercises: Stretches   Active Hamstring Stretch Left;Right;3 reps;10 seconds;20 seconds      Lumbar Exercises: Aerobic   Nustep level 4 x 6 minutes      Lumbar Exercises: Machines for Strengthening   Cybex Knee Extension 5# 1x10    Cybex Knee Flexion 15# 2x10      Lumbar Exercises: Standing   Other Standing Lumbar Exercises Resisted gait froant & back 20lb x5 each      Lumbar Exercises: Seated   Sit to Stand 10 reps   x2 UE pushes from legs   Other Seated Lumbar Exercises HS curls red x15                      PT Short Term Goals - 03/03/21 1010       PT SHORT TERM GOAL #1   Title Pt will be I with initial HEP    Status Partially Met               PT Long Term Goals - 03/05/21 1723       PT LONG TERM GOAL #1   Title Pt will report 50% reduction in LBP    Baseline reports 10% better    Time 8    Period  Weeks    Status On-going      PT LONG TERM GOAL #2   Title Pt will report resolution of BLE radiating pain    Baseline reports still having BLE radiating pain to posterior thigh; stops at knees    Time 8    Period Weeks    Status On-going      PT LONG TERM GOAL #3   Title Pt will demo lumbar AROM <25% limited with no increase in LBP    Time 8    Period Weeks    Status On-going      PT LONG TERM GOAL #4   Title Pt will report able to walk >20 minutes with no increase in LBP    Baseline reports walking about 5 minutes now    Time 8    Period Weeks  Status On-going      PT LONG TERM GOAL #5   Title Pt will demo increase in FOTO score to 68%    Baseline 53%    Time 8    Period Weeks    Status New                   Plan - 03/10/21 1008     Clinical Impression Statement Pt did well with a progressed session. Some weakness noted with step ups. Cues needed to control the eccentric phase with resisted gait. PT has a better tolerance to leg extensions on machine increasing her number of sets. She continues to report a pull in the back of her legs with walking.    Personal Factors and Comorbidities Comorbidity 1    Comorbidities HTN    Examination-Activity Limitations Stairs;Stand;Lift;Locomotion Level    Examination-Participation Restrictions Community Activity;Interpersonal Relationship    Stability/Clinical Decision Making Stable/Uncomplicated    Rehab Potential Good    PT Frequency 2x / week    PT Treatment/Interventions ADLs/Self Care Home Management;Electrical Stimulation;Iontophoresis 65m/ml Dexamethasone;Moist Heat;Neuromuscular re-education;Balance training;Therapeutic exercise;Therapeutic activities;Functional mobility training;Stair training;Gait training;Patient/family education;Manual techniques;Dry needling;Passive range of motion    PT Next Visit Plan gentle progression of TE, core/lumbar stab, hip strengthening, manual/modalities as indicated              Patient will benefit from skilled therapeutic intervention in order to improve the following deficits and impairments:  Abnormal gait, Decreased range of motion, Difficulty walking, Decreased endurance, Increased muscle spasms, Decreased activity tolerance, Pain, Decreased mobility, Decreased strength  Visit Diagnosis: Chronic bilateral low back pain with bilateral sciatica  Difficulty in walking, not elsewhere classified  Cramp and spasm     Problem List There are no problems to display for this patient.   RScot Jun PTA 03/10/2021, 10:11 AM  [place] [address]Phone: [phone]  Fax:  [phone] Name: Olivia TULLOCHMRN: [id number] of Birth: [birth year]

## 2021-03-12 ENCOUNTER — Other Ambulatory Visit: Payer: Self-pay

## 2021-03-12 ENCOUNTER — Ambulatory Visit: Payer: Medicare Other | Admitting: Physical Therapy

## 2021-03-12 DIAGNOSIS — M6281 Muscle weakness (generalized): Secondary | ICD-10-CM

## 2021-03-12 DIAGNOSIS — R262 Difficulty in walking, not elsewhere classified: Secondary | ICD-10-CM

## 2021-03-12 DIAGNOSIS — M5442 Lumbago with sciatica, left side: Secondary | ICD-10-CM

## 2021-03-12 DIAGNOSIS — G8929 Other chronic pain: Secondary | ICD-10-CM

## 2021-03-12 DIAGNOSIS — R252 Cramp and spasm: Secondary | ICD-10-CM | POA: Diagnosis not present

## 2021-03-12 NOTE — Therapy (Signed)
[place]. [place], [place], [address] Phone: [phone]   Fax:  [phone]  Physical Therapy Treatment  Patient Details  Name: ARRYANNA HOLQUIN MRN: [id number] Date of Birth: [birth year] Referring Provider (PT): Rico Ala Date: 03/12/2021   PT End of Session - 03/12/21 1738     Visit Number 5    Date for PT Re-Evaluation 04/19/21    PT Start Time 6237    PT Stop Time 1742    PT Time Calculation (min) 47 min             No past medical history on file.  Past Surgical History:  Procedure Laterality Date   BREAST BIOPSY Right     There were no vitals filed for this visit.   Subjective Assessment - 03/12/21 1700     Subjective PT is really helping, tiny bit of pain in pain of legs    Currently in Pain? No/denies                               [facility] Adult PT Treatment/Exercise - 03/12/21 0001       Lumbar Exercises: Aerobic   Nustep L 5 6 min      Lumbar Exercises: Machines for Strengthening   Cybex Knee Extension 5# 2 sets 10    Cybex Knee Flexion 15# 2x10      Lumbar Exercises: Standing   Heel Raises 20 reps   black bar   Row Theraband;20 reps;Strengthening;Both    Theraband Level (Row) Level 2 (Red)    Shoulder Extension Theraband;20 reps;Both;Strengthening    Theraband Level (Shoulder Extension) Level 2 (Red)    Other Standing Lumbar Exercises standing hip abd/ext/flex 3# x15 BLE    Other Standing Lumbar Exercises Resisted gait front& back 20lb x5 each . 3 x each way   3# mod dead lift 3 sets 5     Lumbar Exercises: Seated   Sit to Stand 10 reps   without UE and wt ball   Other Seated Lumbar Exercises tband trunk ext 15x    Other Seated Lumbar Exercises iso abdominals 10x 3 sec hold                      PT Short Term Goals - 03/12/21 1729       PT SHORT TERM GOAL #1   Title Pt will be I with initial HEP    Status Achieved                PT Long Term Goals - 03/12/21 1729       PT LONG TERM GOAL #1   Title Pt will report 50% reduction in LBP    Baseline pt reports 100% in back and post legs 10-20% better    Status Partially Met      PT LONG TERM GOAL #2   Title Pt will report resolution of BLE radiating pain    Status Partially Met      PT LONG TERM GOAL #3   Title Pt will demo lumbar AROM <25% limited with no increase in LBP    Status Achieved      PT LONG TERM GOAL #4   Title Pt will report able to walk >20 minutes with no increase in LBP    Status Partially Met  Plan - 03/12/21 1741     Clinical Impression Statement pt needed postural cuing throughout session as she tends to hold herself in fwd flexion. weaknes noted in hips and needed cues to engage core. pt is progressing towards goals and sees benefits of PT    PT Treatment/Interventions ADLs/Self Care Home Management;Electrical Stimulation;Iontophoresis 59m/ml Dexamethasone;Moist Heat;Neuromuscular re-education;Balance training;Therapeutic exercise;Therapeutic activities;Functional mobility training;Stair training;Gait training;Patient/family education;Manual techniques;Dry needling;Passive range of motion    PT Next Visit Plan porgress HEP, core and hip strength             Patient will benefit from skilled therapeutic intervention in order to improve the following deficits and impairments:  Abnormal gait, Decreased range of motion, Difficulty walking, Decreased endurance, Increased muscle spasms, Decreased activity tolerance, Pain, Decreased mobility, Decreased strength  Visit Diagnosis: Chronic bilateral low back pain with bilateral sciatica  Difficulty in walking, not elsewhere classified  Muscle weakness (generalized)     Problem List There are no problems to display for this patient.   Mariadejesus Cade,ANGIE PTA 03/12/2021, 5:44 PM  [place] [address]Phone: [phone]  Fax:  [phone] Name: JSAMAYAH NOVINGERMRN: [id number] of Birth: [birth year]

## 2021-03-17 ENCOUNTER — Encounter: Payer: Self-pay | Admitting: Physical Therapy

## 2021-03-17 ENCOUNTER — Ambulatory Visit: Payer: Medicare Other | Admitting: Physical Therapy

## 2021-03-17 ENCOUNTER — Other Ambulatory Visit: Payer: Self-pay

## 2021-03-17 ENCOUNTER — Encounter: Payer: Medicare Other | Admitting: Physical Therapy

## 2021-03-17 DIAGNOSIS — R252 Cramp and spasm: Secondary | ICD-10-CM | POA: Diagnosis not present

## 2021-03-17 DIAGNOSIS — M5442 Lumbago with sciatica, left side: Secondary | ICD-10-CM

## 2021-03-17 DIAGNOSIS — G8929 Other chronic pain: Secondary | ICD-10-CM

## 2021-03-17 DIAGNOSIS — R262 Difficulty in walking, not elsewhere classified: Secondary | ICD-10-CM

## 2021-03-17 DIAGNOSIS — M6281 Muscle weakness (generalized): Secondary | ICD-10-CM

## 2021-03-17 NOTE — Therapy (Signed)
[place]. [place], [place], [address] Phone: [phone]   Fax:  [phone]  Physical Therapy Treatment  Patient Details  Name: Olivia Shah MRN: [id number] Date of Birth: May 30, 1956 Referring Provider (PT): Rico Ala Date: 03/17/2021   PT End of Session - 03/17/21 1142     Visit Number 6    Date for PT Re-Evaluation 04/19/21    PT Start Time 1100    PT Stop Time 1144    PT Time Calculation (min) 44 min    Activity Tolerance Patient tolerated treatment well    Behavior During Therapy [facility] for tasks assessed/performed             History reviewed. No pertinent past medical history.  Past Surgical History:  Procedure Laterality Date   BREAST BIOPSY Right     There were no vitals filed for this visit.   Subjective Assessment - 03/17/21 1103     Subjective "Believe it or not, I am getting better"    Currently in Pain? No/denies                               [facility] Adult PT Treatment/Exercise - 03/17/21 0001       Lumbar Exercises: Aerobic   Nustep L 5 6 min      Lumbar Exercises: Machines for Strengthening   Cybex Knee Extension 5# 2 sets 12    Cybex Knee Flexion 15# 2x12      Lumbar Exercises: Standing   Row Theraband;20 reps;Strengthening;Both    Theraband Level (Row) Level 3 (Green)    Shoulder Extension 20 reps;Both;Strengthening    Shoulder Extension Limitations 5    Other Standing Lumbar Exercises standing hip abd/ext/ 3# x10 BLE      Lumbar Exercises: Seated   Sit to Stand 5 reps   yellow ball x3, one set w/ chest press   Other Seated Lumbar Exercises ball squeezes 2x10                      PT Short Term Goals - 03/12/21 1729       PT SHORT TERM GOAL #1   Title Pt will be I with initial HEP    Status Achieved               PT Long Term Goals - 03/12/21 1729       PT LONG TERM GOAL #1   Title Pt will report 50% reduction  in LBP    Baseline pt reports 100% in back and post legs 10-20% better    Status Partially Met      PT LONG TERM GOAL #2   Title Pt will report resolution of BLE radiating pain    Status Partially Met      PT LONG TERM GOAL #3   Title Pt will demo lumbar AROM <25% limited with no increase in LBP    Status Achieved      PT LONG TERM GOAL #4   Title Pt will report able to walk >20 minutes with no increase in LBP    Status Partially Met                   Plan - 03/17/21 1142     Clinical Impression Statement Pt did well overall in today's session. Some weakness noted with sit to stands. Cues needed  to prevent forward trunk flexion with standing shoulder extensions. Cues for full ROM with seated curls and extensions. Some hip weakness noted with standing abduction.    Personal Factors and Comorbidities Comorbidity 1    Comorbidities HTN    Examination-Activity Limitations Stairs;Stand;Lift;Locomotion Level    Examination-Participation Restrictions Community Activity;Interpersonal Relationship    Stability/Clinical Decision Making Stable/Uncomplicated    Rehab Potential Good    PT Frequency 2x / week    PT Duration 8 weeks    PT Treatment/Interventions ADLs/Self Care Home Management;Electrical Stimulation;Iontophoresis 73m/ml Dexamethasone;Moist Heat;Neuromuscular re-education;Balance training;Therapeutic exercise;Therapeutic activities;Functional mobility training;Stair training;Gait training;Patient/family education;Manual techniques;Dry needling;Passive range of motion             Patient will benefit from skilled therapeutic intervention in order to improve the following deficits and impairments:  Abnormal gait, Decreased range of motion, Difficulty walking, Decreased endurance, Increased muscle spasms, Decreased activity tolerance, Pain, Decreased mobility, Decreased strength  Visit Diagnosis: Chronic bilateral low back pain with bilateral sciatica  Muscle weakness  (generalized)  Cramp and spasm  Difficulty in walking, not elsewhere classified     Problem List There are no problems to display for this patient.   RScot Jun PTA 03/17/2021, 11:46 AM  [place] [address]Phone: [phone]  Fax:  [phone] Name: JLILYAHNA SIRMONMRN: [id number] of Birth: [birth year]

## 2021-03-19 ENCOUNTER — Other Ambulatory Visit: Payer: Self-pay

## 2021-03-19 ENCOUNTER — Ambulatory Visit: Payer: Medicare Other | Admitting: Physical Therapy

## 2021-03-19 ENCOUNTER — Encounter: Payer: Self-pay | Admitting: Physical Therapy

## 2021-03-19 DIAGNOSIS — M6281 Muscle weakness (generalized): Secondary | ICD-10-CM

## 2021-03-19 DIAGNOSIS — R252 Cramp and spasm: Secondary | ICD-10-CM | POA: Diagnosis not present

## 2021-03-19 DIAGNOSIS — R262 Difficulty in walking, not elsewhere classified: Secondary | ICD-10-CM

## 2021-03-19 DIAGNOSIS — G8929 Other chronic pain: Secondary | ICD-10-CM

## 2021-03-19 NOTE — Therapy (Signed)
[place]. [place], [place], [address] Phone: [phone]   Fax:  [phone]  Physical Therapy Treatment  Patient Details  Name: Olivia Shah MRN: [id number] Date of Birth: 31-Aug-1956 Referring Provider (PT): Rico Ala Date: 03/19/2021   PT End of Session - 03/19/21 1743     Visit Number 7    Date for PT Re-Evaluation 04/19/21    PT Start Time 6219    PT Stop Time 1740    PT Time Calculation (min) 45 min    Activity Tolerance Patient tolerated treatment well    Behavior During Therapy [facility] for tasks assessed/performed             History reviewed. No pertinent past medical history.  Past Surgical History:  Procedure Laterality Date   BREAST BIOPSY Right     There were no vitals filed for this visit.   Subjective Assessment - 03/19/21 1654     Subjective Pt reports she is doing well.    Currently in Pain? Yes    Pain Score 5     Pain Location Leg    Pain Orientation Right;Left                               [facility] Adult PT Treatment/Exercise - 03/19/21 0001       Lumbar Exercises: Stretches   Active Hamstring Stretch Right;Left;2 reps;30 seconds      Lumbar Exercises: Aerobic   Nustep L 5 7 min      Lumbar Exercises: Standing   Row Theraband;Strengthening;Both   2x12   Theraband Level (Row) Level 3 (Green)    Shoulder Extension 20 reps;Both;Strengthening    Theraband Level (Shoulder Extension) Level 3 (Green)    Other Standing Lumbar Exercises standing hip abd/ext/  x10 BLE   with red tband for hip extension     Lumbar Exercises: Seated   Sit to Stand 20 reps   3# with chest press and OHP   Other Seated Lumbar Exercises tband trunk ext 20x    Other Seated Lumbar Exercises iso abdominals 2x12 5 sec hold, seated clams with green band 2x12, diagonal with yellow ball 2x10, scapular retraction 2x12 seated marching lifting opposite arm and leg 2x12   cues for  keeping shoulders from hiking throughout exercises                     PT Short Term Goals - 03/12/21 1729       PT SHORT TERM GOAL #1   Title Pt will be I with initial HEP    Status Achieved               PT Long Term Goals - 03/19/21 1742       PT LONG TERM GOAL #1   Title Pt will report 50% reduction in LBP    Baseline pt reports 100% in back and post legs 10-20% better    Time 8    Period Weeks    Status Partially Met      PT LONG TERM GOAL #2   Title Pt will report resolution of BLE radiating pain    Baseline reports still having BLE radiating pain to posterior thigh; stops at knees    Time 8    Period Weeks    Status Partially Met      PT LONG TERM GOAL #3  Title Pt will demo lumbar AROM <25% limited with no increase in LBP    Time 8    Period Weeks    Status Achieved      PT LONG TERM GOAL #4   Title Pt will report able to walk >20 minutes with no increase in LBP    Time 8    Period Weeks    Status Partially Met      PT LONG TERM GOAL #5   Title Pt will demo increase in FOTO score to 68%    Baseline 53%    Time 8    Period Weeks    Status On-going                   Plan - 03/19/21 1739     Clinical Impression Statement Pt did well overall and felt good following treatment. She has pain in the back of her legs while standing but a seated hamstring stretch helped with the pain. Pt continued to progress strengthening exercises. She required cues for preventing shoulder hiking while performing seated exercises. Hip abduction was noted while doing standing abduction.    PT Treatment/Interventions ADLs/Self Care Home Management;Electrical Stimulation;Iontophoresis 72m/ml Dexamethasone;Moist Heat;Neuromuscular re-education;Balance training;Therapeutic exercise;Therapeutic activities;Functional mobility training;Stair training;Gait training;Patient/family education;Manual techniques;Dry needling;Passive range of motion    PT Next Visit  Plan porgress HEP, core and hip strength    PT Home Exercise Plan see pt instructions             Patient will benefit from skilled therapeutic intervention in order to improve the following deficits and impairments:  Abnormal gait, Decreased range of motion, Difficulty walking, Decreased endurance, Increased muscle spasms, Decreased activity tolerance, Pain, Decreased mobility, Decreased strength  Visit Diagnosis: Chronic bilateral low back pain with bilateral sciatica  Muscle weakness (generalized)  Cramp and spasm  Difficulty in walking, not elsewhere classified     Problem List There are no problems to display for this patient.   EDawayne Cirri SPTA 03/19/2021, 5:45 PM  [place] [address]Phone: [phone]  Fax:  [phone] Name: Olivia LAHMANMRN: [id number] of Birth: 7August 15, 1957

## 2021-03-24 ENCOUNTER — Other Ambulatory Visit: Payer: Self-pay

## 2021-03-24 ENCOUNTER — Ambulatory Visit: Payer: Medicare Other | Admitting: Physical Therapy

## 2021-03-24 ENCOUNTER — Encounter: Payer: Self-pay | Admitting: Physical Therapy

## 2021-03-24 DIAGNOSIS — R252 Cramp and spasm: Secondary | ICD-10-CM | POA: Diagnosis not present

## 2021-03-24 DIAGNOSIS — M6281 Muscle weakness (generalized): Secondary | ICD-10-CM

## 2021-03-24 DIAGNOSIS — G8929 Other chronic pain: Secondary | ICD-10-CM

## 2021-03-24 DIAGNOSIS — R262 Difficulty in walking, not elsewhere classified: Secondary | ICD-10-CM

## 2021-03-24 NOTE — Therapy (Signed)
[place]. [place], [place], [address] Phone: [phone]   Fax:  [phone]  Physical Therapy Treatment  Patient Details  Name: Olivia Shah MRN: [id number] Date of Birth: Oct 15, 1955 Referring Provider (PT): Saintclair Halsted   Encounter Date: 03/24/2021   PT End of Session - 03/24/21 1016     Visit Number 8    Date for PT Re-Evaluation 04/19/21    PT Start Time 0930    PT Stop Time 1011    PT Time Calculation (min) 41 min    Activity Tolerance Patient tolerated treatment well    Behavior During Therapy [facility] for tasks assessed/performed             History reviewed. No pertinent past medical history.  Past Surgical History:  Procedure Laterality Date   BREAST BIOPSY Right     There were no vitals filed for this visit.   Subjective Assessment - 03/24/21 0930     Subjective Pt reports she is doing well. Her pain has been about the same.    Currently in Pain? Yes    Pain Score 3     Pain Location Leg                               [facility] Adult PT Treatment/Exercise - 03/24/21 0001       Lumbar Exercises: Stretches   Passive Hamstring Stretch 3 reps;30 seconds   bottom leg straight     Lumbar Exercises: Aerobic   Nustep L 5 7 min      Lumbar Exercises: Standing   Other Standing Lumbar Exercises forward step ups with hip extension x5 B lateral step ups with hip abduction x10    Other Standing Lumbar Exercises Resisted gait fwd/bwd 30#, lateral 20#      Lumbar Exercises: Seated   Sit to Stand 20 reps   yellow ball OHP 1 set, 1 set chest press   Other Seated Lumbar Exercises Hor. Abd with yellow band 2x10    Other Seated Lumbar Exercises diagonals with yellow ball 2x10 on dyna disc      Lumbar Exercises: Supine   Bridge 20 reps                      PT Short Term Goals - 03/12/21 1729       PT SHORT TERM GOAL #1   Title Pt will be I with initial HEP    Status  Achieved               PT Long Term Goals - 03/19/21 1742       PT LONG TERM GOAL #1   Title Pt will report 50% reduction in LBP    Baseline pt reports 100% in back and post legs 10-20% better    Time 8    Period Weeks    Status Partially Met      PT LONG TERM GOAL #2   Title Pt will report resolution of BLE radiating pain    Baseline reports still having BLE radiating pain to posterior thigh; stops at knees    Time 8    Period Weeks    Status Partially Met      PT LONG TERM GOAL #3   Title Pt will demo lumbar AROM <25% limited with no increase in LBP    Time 8    Period Weeks  Status Achieved      PT LONG TERM GOAL #4   Title Pt will report able to walk >20 minutes with no increase in LBP    Time 8    Period Weeks    Status Partially Met      PT LONG TERM GOAL #5   Title Pt will demo increase in FOTO score to 68%    Baseline 53%    Time 8    Period Weeks    Status On-going                   Plan - 03/24/21 1012     Clinical Impression Statement Pt continued to progress hip and core strenghtening exercises with noted weakness in hip extensors and abductors. She continues to have pain in the back of her legs while standing. Pt required cueing to decrease shoulder hiking during horizontal abduction. She reported feeling well following treatment.    PT Treatment/Interventions ADLs/Self Care Home Management;Electrical Stimulation;Iontophoresis 10m/ml Dexamethasone;Moist Heat;Neuromuscular re-education;Balance training;Therapeutic exercise;Therapeutic activities;Functional mobility training;Stair training;Gait training;Patient/family education;Manual techniques;Dry needling;Passive range of motion    PT Next Visit Plan porgress HEP, core and hip strength    PT Home Exercise Plan see pt instructions             Patient will benefit from skilled therapeutic intervention in order to improve the following deficits and impairments:  Abnormal gait,  Decreased range of motion, Difficulty walking, Decreased endurance, Increased muscle spasms, Decreased activity tolerance, Pain, Decreased mobility, Decreased strength  Visit Diagnosis: Chronic bilateral low back pain with bilateral sciatica  Muscle weakness (generalized)  Cramp and spasm  Difficulty in walking, not elsewhere classified     Problem List There are no problems to display for this patient.   EDawayne Cirri SPTA 03/24/2021, 10:17 AM  [place] [address]Phone: [phone]  Fax:  [phone] Name: Olivia WIRKKALAMRN: [id number] of Birth: 720-Nov-1957

## 2021-03-25 ENCOUNTER — Other Ambulatory Visit: Payer: Self-pay | Admitting: *Deleted

## 2021-03-25 DIAGNOSIS — D496 Neoplasm of unspecified behavior of brain: Secondary | ICD-10-CM

## 2021-03-25 NOTE — Progress Notes (Signed)
Per Dr Jaynee Eagles, referral order placed for Dr Zada Finders.

## 2021-03-27 ENCOUNTER — Ambulatory Visit: Payer: Medicare Other | Attending: Neurosurgery | Admitting: Physical Therapy

## 2021-03-27 ENCOUNTER — Encounter: Payer: Self-pay | Admitting: Physical Therapy

## 2021-03-27 ENCOUNTER — Other Ambulatory Visit: Payer: Self-pay

## 2021-03-27 DIAGNOSIS — M5442 Lumbago with sciatica, left side: Secondary | ICD-10-CM | POA: Diagnosis present

## 2021-03-27 DIAGNOSIS — D320 Benign neoplasm of cerebral meninges: Secondary | ICD-10-CM | POA: Insufficient documentation

## 2021-03-27 DIAGNOSIS — R262 Difficulty in walking, not elsewhere classified: Secondary | ICD-10-CM | POA: Diagnosis present

## 2021-03-27 DIAGNOSIS — M5441 Lumbago with sciatica, right side: Secondary | ICD-10-CM | POA: Insufficient documentation

## 2021-03-27 DIAGNOSIS — G8929 Other chronic pain: Secondary | ICD-10-CM | POA: Diagnosis present

## 2021-03-27 DIAGNOSIS — M6281 Muscle weakness (generalized): Secondary | ICD-10-CM

## 2021-03-27 DIAGNOSIS — R252 Cramp and spasm: Secondary | ICD-10-CM

## 2021-03-27 NOTE — Therapy (Signed)
[place]. [place], [place], [address] Phone: [phone]   Fax:  [phone]  Physical Therapy Treatment  Patient Details  Name: LAYLAA GUEVARRA MRN: [id number] Date of Birth: [birth year] Referring Provider (PT): Rico Ala Date: 03/27/2021   PT End of Session - 03/27/21 1109     Visit Number 9    Date for PT Re-Evaluation 04/19/21    PT Start Time 0927    PT Stop Time 1012    PT Time Calculation (min) 45 min    Activity Tolerance Patient tolerated treatment well    Behavior During Therapy [facility] for tasks assessed/performed             History reviewed. No pertinent past medical history.  Past Surgical History:  Procedure Laterality Date   BREAST BIOPSY Right     There were no vitals filed for this visit.   Subjective Assessment - 03/27/21 0930     Subjective Pt reports she is doing well with only a little bit of pain.    Currently in Pain? Yes    Pain Score 3     Pain Location Leg    Pain Orientation Right;Left                               [facility] Adult PT Treatment/Exercise - 03/27/21 0001       Lumbar Exercises: Stretches   Active Hamstring Stretch Right;Left;2 reps;30 seconds   seated     Lumbar Exercises: Aerobic   Recumbent Bike L1.5 x 6 min      Lumbar Exercises: Machines for Strengthening   Other Lumbar Machine Exercise Rows 2x10 10#, lumbar ext 2x10    Other Lumbar Machine Exercise AR 5# 2x10      Lumbar Exercises: Standing   Other Standing Lumbar Exercises lateral steps with red theraband 2x10, hip abduction 2x10 yellow theraband      Lumbar Exercises: Seated   Sit to Stand 15 reps   on airex   Other Seated Lumbar Exercises Hor. abd red theraband 2x10    Other Seated Lumbar Exercises clams 2x10 red theraband      Lumbar Exercises: Supine   Bridge 20 reps      Manual Therapy   Manual Therapy Passive ROM    Manual therapy comments LE, hamstring,  ITB, pirifomris                      PT Short Term Goals - 03/12/21 1729       PT SHORT TERM GOAL #1   Title Pt will be I with initial HEP    Status Achieved               PT Long Term Goals - 03/27/21 1105       PT LONG TERM GOAL #1   Title Pt will report 50% reduction in LBP    Baseline pt reports 100% in back and post legs 10-20% better    Time 8    Period Weeks    Status Partially Met      PT LONG TERM GOAL #2   Title Pt will report resolution of BLE radiating pain    Baseline reports still having BLE radiating pain to posterior thigh; stops at knees    Time 8    Period Weeks    Status Partially Met  PT LONG TERM GOAL #3   Title Pt will demo lumbar AROM <25% limited with no increase in LBP    Time 8    Period Weeks    Status Achieved      PT LONG TERM GOAL #4   Title Pt will report able to walk >20 minutes with no increase in LBP    Baseline reports walking about 5 minutes now    Time 8    Period Weeks    Status Partially Met      PT LONG TERM GOAL #5   Title Pt will demo increase in FOTO score to 68%    Baseline 53%    Time 8    Period Weeks    Status On-going                   Plan - 03/27/21 1106     Clinical Impression Statement Pt continued to progress strengthening exercises. She continues to have pain in the back of her legs that was relieved with hamstring stretching. Pt tighter on R than L.  She continues to have weakness in her hip abductors and extensors. Following treatment pt reported her pain decreased.    PT Treatment/Interventions ADLs/Self Care Home Management;Electrical Stimulation;Iontophoresis 58m/ml Dexamethasone;Moist Heat;Neuromuscular re-education;Balance training;Therapeutic exercise;Therapeutic activities;Functional mobility training;Stair training;Gait training;Patient/family education;Manual techniques;Dry needling;Passive range of motion    PT Next Visit Plan porgress HEP, core and hip strength     PT Home Exercise Plan see pt instructions             Patient will benefit from skilled therapeutic intervention in order to improve the following deficits and impairments:  Abnormal gait, Decreased range of motion, Difficulty walking, Decreased endurance, Increased muscle spasms, Decreased activity tolerance, Pain, Decreased mobility, Decreased strength  Visit Diagnosis: Chronic bilateral low back pain with bilateral sciatica  Muscle weakness (generalized)  Cramp and spasm  Difficulty in walking, not elsewhere classified     Problem List There are no problems to display for this patient.   EDawayne Cirri SPTA 03/27/2021, 11:10 AM  [place] [address]Phone: [phone]  Fax:  [phone] Name: JKAWENA LYDAYMRN: [id number] of Birth: 7August 16, 1957

## 2021-03-31 ENCOUNTER — Ambulatory Visit: Payer: Medicare Other | Admitting: Physical Therapy

## 2021-04-02 ENCOUNTER — Encounter: Payer: Self-pay | Admitting: Physical Therapy

## 2021-04-02 ENCOUNTER — Other Ambulatory Visit: Payer: Self-pay

## 2021-04-02 ENCOUNTER — Ambulatory Visit: Payer: Medicare Other | Admitting: Physical Therapy

## 2021-04-02 DIAGNOSIS — M5442 Lumbago with sciatica, left side: Secondary | ICD-10-CM | POA: Diagnosis not present

## 2021-04-02 DIAGNOSIS — M6281 Muscle weakness (generalized): Secondary | ICD-10-CM

## 2021-04-02 DIAGNOSIS — R252 Cramp and spasm: Secondary | ICD-10-CM

## 2021-04-02 DIAGNOSIS — R262 Difficulty in walking, not elsewhere classified: Secondary | ICD-10-CM

## 2021-04-02 DIAGNOSIS — G8929 Other chronic pain: Secondary | ICD-10-CM

## 2021-04-02 NOTE — Therapy (Signed)
[place]. [place], [place], [address] Phone: [phone]   Fax:  [phone]  Physical Therapy Treatment Progress Note Reporting Period 02/17/2021 to 04/02/2021  See note below for Objective Data and Assessment of Progress/Goals.     Patient Details  Name: Olivia Shah MRN: [id number] Date of Birth: 07-11-1956 Referring Provider (PT): Rico Ala Date: 04/02/2021   PT End of Session - 04/02/21 1722     Visit Number 10    Date for PT Re-Evaluation 04/19/21    PT Start Time 1642    PT Stop Time 1724    PT Time Calculation (min) 42 min    Activity Tolerance Patient tolerated treatment well    Behavior During Therapy [facility] for tasks assessed/performed             History reviewed. No pertinent past medical history.  Past Surgical History:  Procedure Laterality Date   BREAST BIOPSY Right     There were no vitals filed for this visit.   Subjective Assessment - 04/02/21 1645     Subjective Pt states no new changes                               [facility] Adult PT Treatment/Exercise - 04/02/21 0001       Lumbar Exercises: Aerobic   Recumbent Bike L2 x 5 min    Nustep L5 x 6 min      Lumbar Exercises: Machines for Strengthening   Cybex Lumbar Extension black tb 2x10    Cybex Knee Extension 5# 2x10 BLE    Cybex Knee Flexion 20# 2x10 BLE    Other Lumbar Machine Exercise rows and lats 20# 2x10    Other Lumbar Machine Exercise standing shoulder ext 5# 2x10      Lumbar Exercises: Standing   Heel Raises 15 reps                      PT Short Term Goals - 03/12/21 1729       PT SHORT TERM GOAL #1   Title Pt will be I with initial HEP    Status Achieved               PT Long Term Goals - 04/02/21 1722       PT LONG TERM GOAL #1   Title Pt will report 50% reduction in LBP    Baseline pt reports 100% in back and post legs 30-40% better    Time 8     Period Weeks    Status Partially Met      PT LONG TERM GOAL #2   Title Pt will report resolution of BLE radiating pain    Baseline reports still having some radiating pain but decreased severity    Time 8    Period Weeks    Status Partially Met      PT LONG TERM GOAL #3   Title Pt will demo lumbar AROM <25% limited with no increase in LBP    Time 8    Period Weeks    Status Achieved      PT LONG TERM GOAL #4   Title Pt will report able to walk >20 minutes with no increase in LBP    Baseline reports not limited by back pain now; limited by legs    Time 8    Period Weeks  Status Partially Met      PT LONG TERM GOAL #5   Title Pt will demo increase in FOTO score to 68%    Baseline 53%    Time 8    Period Weeks    Status On-going                   Plan - 04/02/21 1724     Clinical Impression Statement Pt is making progress toward LTGs. Demos mild leg pain with exercise and reports some residual BLE radiating pain. Pt states that overall she feels improvement in her pain. Demos core and hip weakness. Cues for form with standing AR press and shoulder extensions. Continue to progress to tolerance.    PT Treatment/Interventions ADLs/Self Care Home Management;Electrical Stimulation;Iontophoresis 69m/ml Dexamethasone;Moist Heat;Neuromuscular re-education;Balance training;Therapeutic exercise;Therapeutic activities;Functional mobility training;Stair training;Gait training;Patient/family education;Manual techniques;Dry needling;Passive range of motion    PT Next Visit Plan porgress HEP, core and hip strength    Consulted and Agree with Plan of Care Patient             Patient will benefit from skilled therapeutic intervention in order to improve the following deficits and impairments:  Abnormal gait, Decreased range of motion, Difficulty walking, Decreased endurance, Increased muscle spasms, Decreased activity tolerance, Pain, Decreased mobility, Decreased  strength  Visit Diagnosis: Chronic bilateral low back pain with bilateral sciatica  Muscle weakness (generalized)  Cramp and spasm  Difficulty in walking, not elsewhere classified     Problem List There are no problems to display for this patient.  AAmador Cunas PT, DPT ADonald ProseSugg 04/02/2021, 5:27 PM  [place] [address]Phone: [phone]  Fax:  [phone] Name: Olivia HEARNMRN: [id number] of Birth: 7Aug 12, 1957

## 2021-04-03 ENCOUNTER — Encounter: Payer: Self-pay | Admitting: Physical Therapy

## 2021-04-03 ENCOUNTER — Ambulatory Visit: Payer: Medicare Other | Admitting: Physical Therapy

## 2021-04-03 DIAGNOSIS — M5442 Lumbago with sciatica, left side: Secondary | ICD-10-CM

## 2021-04-03 DIAGNOSIS — G8929 Other chronic pain: Secondary | ICD-10-CM

## 2021-04-03 DIAGNOSIS — R262 Difficulty in walking, not elsewhere classified: Secondary | ICD-10-CM

## 2021-04-03 DIAGNOSIS — R252 Cramp and spasm: Secondary | ICD-10-CM

## 2021-04-03 DIAGNOSIS — M6281 Muscle weakness (generalized): Secondary | ICD-10-CM

## 2021-04-03 NOTE — Therapy (Signed)
[place]. [place], [place], [address] Phone: [phone]   Fax:  [phone]  Physical Therapy Treatment  Patient Details  Name: Olivia Shah MRN: [id number] Date of Birth: 02-06-1956 Referring Provider (PT): Saintclair Halsted   Encounter Date: 04/03/2021   PT End of Session - 04/03/21 1016     Visit Number 11    Date for PT Re-Evaluation 04/19/21    PT Start Time 0930    PT Stop Time 1011    PT Time Calculation (min) 41 min    Activity Tolerance Patient tolerated treatment well    Behavior During Therapy [facility] for tasks assessed/performed             History reviewed. No pertinent past medical history.  Past Surgical History:  Procedure Laterality Date   BREAST BIOPSY Right     There were no vitals filed for this visit.   Subjective Assessment - 04/03/21 0932     Subjective Pt states she is doing well but has some pain in her legs    Currently in Pain? Yes    Pain Score 3     Pain Location Leg    Pain Orientation Left;Right                               [facility] Adult PT Treatment/Exercise - 04/03/21 0001       Lumbar Exercises: Stretches   Active Hamstring Stretch Right;Left;2 reps;30 seconds    Passive Hamstring Stretch 3 reps;30 seconds   bottom leg straight     Lumbar Exercises: Aerobic   Recumbent Bike L2 x 6 min      Lumbar Exercises: Machines for Strengthening   Cybex Lumbar Extension black tb 2x10    Other Lumbar Machine Exercise Rows 2x10 10#, lumbar ext 2x10    Other Lumbar Machine Exercise AR press 10# x12 each, shoulder extension 5# 2x12      Lumbar Exercises: Standing   Other Standing Lumbar Exercises lateral step over 2 1/2 foam 2# x5, backward step overs x5      Lumbar Exercises: Seated   Sit to Stand 20 reps   yellow ball OHP 1 set, 1 set chest press   Other Seated Lumbar Exercises Hor. abd red theraband 2x10                      PT Short Term  Goals - 03/12/21 1729       PT SHORT TERM GOAL #1   Title Pt will be I with initial HEP    Status Achieved               PT Long Term Goals - 04/03/21 1011       PT LONG TERM GOAL #1   Title Pt will report 50% reduction in LBP    Baseline pt reports 100% in back and post legs 30-40% better    Time 8    Period Weeks    Status Partially Met      PT LONG TERM GOAL #2   Title Pt will report resolution of BLE radiating pain    Baseline reports still having some radiating pain but decreased severity    Time 8    Period Weeks    Status Partially Met      PT LONG TERM GOAL #3   Title Pt will demo lumbar AROM <25% limited  with no increase in LBP    Time 8    Period Weeks    Status Achieved      PT LONG TERM GOAL #4   Title Pt will report able to walk >20 minutes with no increase in LBP    Baseline reports not limited by back pain now; limited by legs    Time 8    Period Weeks    Status Partially Met      PT LONG TERM GOAL #5   Title Pt will demo increase in FOTO score to 68%    Baseline 53%    Time 8    Period Weeks    Status On-going                   Plan - 04/03/21 1019     Clinical Impression Statement Pt reports she is doing well but is having pain in the back of her legs. She reports that that pain starts when she is exercising and is relieved when doing hamstring stretching. Educated pt that the pain she is feeling might be her legs working since it is relieved after stretching. A seated hamstring stretch was given to the pt for at home. She continues to have hip weakness throughout standing exercises.    PT Treatment/Interventions ADLs/Self Care Home Management;Electrical Stimulation;Iontophoresis 62m/ml Dexamethasone;Moist Heat;Neuromuscular re-education;Balance training;Therapeutic exercise;Therapeutic activities;Functional mobility training;Stair training;Gait training;Patient/family education;Manual techniques;Dry needling;Passive range of motion     PT Next Visit Plan porgress HEP, core and hip strength    PT Home Exercise Plan see pt instructions, given seated hamstring stretch             Patient will benefit from skilled therapeutic intervention in order to improve the following deficits and impairments:  Abnormal gait, Decreased range of motion, Difficulty walking, Decreased endurance, Increased muscle spasms, Decreased activity tolerance, Pain, Decreased mobility, Decreased strength  Visit Diagnosis: Chronic bilateral low back pain with bilateral sciatica  Muscle weakness (generalized)  Cramp and spasm  Difficulty in walking, not elsewhere classified     Problem List There are no problems to display for this patient.   EDawayne Cirri SWoodstock7/04/2021, 10:26 AM  [place] [address]Phone: [phone]  Fax:  [phone] Name: Olivia FRAISERMRN: [id number] of Birth: 7Apr 22, 1957

## 2021-04-07 ENCOUNTER — Other Ambulatory Visit (HOSPITAL_COMMUNITY): Payer: Self-pay | Admitting: Neurological Surgery

## 2021-04-07 ENCOUNTER — Other Ambulatory Visit: Payer: Self-pay | Admitting: Neurological Surgery

## 2021-04-07 ENCOUNTER — Encounter: Payer: Self-pay | Admitting: Physical Therapy

## 2021-04-07 ENCOUNTER — Ambulatory Visit: Payer: Medicare Other | Admitting: Physical Therapy

## 2021-04-07 ENCOUNTER — Other Ambulatory Visit: Payer: Self-pay

## 2021-04-07 DIAGNOSIS — G8929 Other chronic pain: Secondary | ICD-10-CM

## 2021-04-07 DIAGNOSIS — R252 Cramp and spasm: Secondary | ICD-10-CM

## 2021-04-07 DIAGNOSIS — M5441 Lumbago with sciatica, right side: Secondary | ICD-10-CM

## 2021-04-07 DIAGNOSIS — D320 Benign neoplasm of cerebral meninges: Secondary | ICD-10-CM

## 2021-04-07 DIAGNOSIS — M5442 Lumbago with sciatica, left side: Secondary | ICD-10-CM | POA: Diagnosis not present

## 2021-04-07 DIAGNOSIS — R262 Difficulty in walking, not elsewhere classified: Secondary | ICD-10-CM

## 2021-04-07 DIAGNOSIS — M6281 Muscle weakness (generalized): Secondary | ICD-10-CM

## 2021-04-07 NOTE — Therapy (Signed)
[place]. [place], [place], [address] Phone: [phone]   Fax:  [phone]  Physical Therapy Treatment  Patient Details  Name: Olivia Shah MRN: [id number] Date of Birth: 29-Oct-1955 Referring Provider (PT): Rico Ala Date: 04/07/2021   PT End of Session - 04/07/21 1009     Visit Number 12    Date for PT Re-Evaluation 04/19/21    PT Start Time 0930    PT Stop Time 1013    PT Time Calculation (min) 43 min    Activity Tolerance Patient tolerated treatment well    Behavior During Therapy [facility] for tasks assessed/performed             History reviewed. No pertinent past medical history.  Past Surgical History:  Procedure Laterality Date   BREAST BIOPSY Right     There were no vitals filed for this visit.   Subjective Assessment - 04/07/21 0932     Subjective Fine, still pain it the back of leg    Currently in Pain? Yes    Pain Score 5     Pain Location Leg    Pain Orientation Left;Upper;Posterior                               [facility] Adult PT Treatment/Exercise - 04/07/21 0001       Ambulation/Gait   Ambulation/Gait Yes    Ambulation Distance (Feet) 200 Feet    Assistive device None    Gait Pattern Lateral trunk lean to left    Ambulation Surface Unlevel;Outdoor;Paved    Gait Comments outside up and down slope.      Lumbar Exercises: Stretches   Active Hamstring Stretch Right;Left;2 reps;20 seconds      Lumbar Exercises: Machines for Strengthening   Cybex Knee Extension 5# 2x10 BLE    Cybex Knee Flexion 20# 2x10 BLE    Other Lumbar Machine Exercise Rows & Lats 20lb 2x10      Lumbar Exercises: Standing   Shoulder Extension Power Tower;20 reps;Both    Shoulder Extension Limitations 5    Other Standing Lumbar Exercises $in step up x10 each HHA x2      Lumbar Exercises: Seated   Sit to Stand 10 reps   x2, 2nd set with red ball                      PT Short Term Goals - 03/12/21 1729       PT SHORT TERM GOAL #1   Title Pt will be I with initial HEP    Status Achieved               PT Long Term Goals - 04/03/21 1011       PT LONG TERM GOAL #1   Title Pt will report 50% reduction in LBP    Baseline pt reports 100% in back and post legs 30-40% better    Time 8    Period Weeks    Status Partially Met      PT LONG TERM GOAL #2   Title Pt will report resolution of BLE radiating pain    Baseline reports still having some radiating pain but decreased severity    Time 8    Period Weeks    Status Partially Met      PT LONG TERM GOAL #3   Title Pt will demo lumbar  AROM <25% limited with no increase in LBP    Time 8    Period Weeks    Status Achieved      PT LONG TERM GOAL #4   Title Pt will report able to walk >20 minutes with no increase in LBP    Baseline reports not limited by back pain now; limited by legs    Time 8    Period Weeks    Status Partially Met      PT LONG TERM GOAL #5   Title Pt will demo increase in FOTO score to 68%    Baseline 53%    Time 8    Period Weeks    Status On-going                   Plan - 04/07/21 1011     Clinical Impression Statement Progressed to outdoor ambulation with increase fatigue. She needed 3 standing rest breaks during the up hill section. Some increase in hamstring discomfort that was relieved by stretching. Increase resistance tolerated with seated rows and lats. Tactile cue's for posture needed with standing shoulder extensions.    Personal Factors and Comorbidities Comorbidity 1    Comorbidities HTN    Examination-Activity Limitations Stairs;Stand;Lift;Locomotion Level    Examination-Participation Restrictions Community Activity;Interpersonal Relationship    Rehab Potential Good    PT Frequency 2x / week    PT Duration 8 weeks    PT Treatment/Interventions ADLs/Self Care Home Management;Electrical Stimulation;Iontophoresis 80m/ml  Dexamethasone;Moist Heat;Neuromuscular re-education;Balance training;Therapeutic exercise;Therapeutic activities;Functional mobility training;Stair training;Gait training;Patient/family education;Manual techniques;Dry needling;Passive range of motion    PT Next Visit Plan porgress HEP, core and hip strength             Patient will benefit from skilled therapeutic intervention in order to improve the following deficits and impairments:  Abnormal gait, Decreased range of motion, Difficulty walking, Decreased endurance, Increased muscle spasms, Decreased activity tolerance, Pain, Decreased mobility, Decreased strength  Visit Diagnosis: Cramp and spasm  Muscle weakness (generalized)  Chronic bilateral low back pain with bilateral sciatica  Difficulty in walking, not elsewhere classified     Problem List There are no problems to display for this patient.   RScot Jun7/08/2021, 10:12 AM  [place] [address]Phone: [phone]  Fax:  [phone] Name: Olivia MCHANEYMRN: [id number] of Birth: 715-Aug-1957

## 2021-04-09 ENCOUNTER — Other Ambulatory Visit: Payer: Self-pay

## 2021-04-09 ENCOUNTER — Encounter: Payer: Self-pay | Admitting: Physical Therapy

## 2021-04-09 ENCOUNTER — Ambulatory Visit: Payer: Medicare Other | Admitting: Physical Therapy

## 2021-04-09 DIAGNOSIS — M5442 Lumbago with sciatica, left side: Secondary | ICD-10-CM | POA: Diagnosis not present

## 2021-04-09 DIAGNOSIS — R262 Difficulty in walking, not elsewhere classified: Secondary | ICD-10-CM

## 2021-04-09 DIAGNOSIS — R252 Cramp and spasm: Secondary | ICD-10-CM

## 2021-04-09 DIAGNOSIS — G8929 Other chronic pain: Secondary | ICD-10-CM

## 2021-04-09 DIAGNOSIS — M6281 Muscle weakness (generalized): Secondary | ICD-10-CM

## 2021-04-09 NOTE — Therapy (Signed)
[place]. [place], [place], [address] Phone: [phone]   Fax:  [phone]  Physical Therapy Treatment  Patient Details  Name: Olivia Shah MRN: [id number] Date of Birth: 11-09-1955 Referring Provider (PT): Rico Ala Date: 04/09/2021   PT End of Session - 04/09/21 1730     Visit Number 13    Date for PT Re-Evaluation 04/19/21    PT Start Time 1645    PT Stop Time 1728    PT Time Calculation (min) 43 min    Activity Tolerance Patient tolerated treatment well    Behavior During Therapy [facility] for tasks assessed/performed             History reviewed. No pertinent past medical history.  Past Surgical History:  Procedure Laterality Date   BREAST BIOPSY Right     There were no vitals filed for this visit.   Subjective Assessment - 04/09/21 1651     Subjective Pt reports increase pain in back of legs today.    Currently in Pain? Yes    Pain Score 5     Pain Location Leg    Pain Orientation Right;Left;Posterior                               [facility] Adult PT Treatment/Exercise - 04/09/21 0001       Lumbar Exercises: Stretches   Active Hamstring Stretch Right;Left;2 reps;20 seconds    Gastroc Stretch Right;Left;1 rep;20 seconds      Lumbar Exercises: Aerobic   Nustep L5 x 6 min      Lumbar Exercises: Machines for Strengthening   Cybex Lumbar Extension black tb 2x10    Cybex Knee Extension 5# 2x10 BLE    Cybex Knee Flexion 20# 2x10 BLE    Other Lumbar Machine Exercise Rows & Lats 20lb 2x10    Other Lumbar Machine Exercise AR press 10# x12 each, shoulder extension 5# 2x12      Lumbar Exercises: Standing   Heel Raises 15 reps      Lumbar Exercises: Seated   Sit to Stand 20 reps   2x10 with yellow ball chest press and OHP   Other Seated Lumbar Exercises seated iso abs with pball x15 3 sec hold                      PT Short Term Goals - 03/12/21 1729        PT SHORT TERM GOAL #1   Title Pt will be I with initial HEP    Status Achieved               PT Long Term Goals - 04/03/21 1011       PT LONG TERM GOAL #1   Title Pt will report 50% reduction in LBP    Baseline pt reports 100% in back and post legs 30-40% better    Time 8    Period Weeks    Status Partially Met      PT LONG TERM GOAL #2   Title Pt will report resolution of BLE radiating pain    Baseline reports still having some radiating pain but decreased severity    Time 8    Period Weeks    Status Partially Met      PT LONG TERM GOAL #3   Title Pt will demo lumbar AROM <25% limited with no  increase in LBP    Time 8    Period Weeks    Status Achieved      PT LONG TERM GOAL #4   Title Pt will report able to walk >20 minutes with no increase in LBP    Baseline reports not limited by back pain now; limited by legs    Time 8    Period Weeks    Status Partially Met      PT LONG TERM GOAL #5   Title Pt will demo increase in FOTO score to 68%    Baseline 53%    Time 8    Period Weeks    Status On-going                   Plan - 04/09/21 1731     Clinical Impression Statement Pt demos improved tolerance to exercise overall. Requires cues for form with standing shoulder ext for upright posture and with seated rows to avoid postural sway. LE pain relieved with hamstring/piriformis stretching. Discussed plans to continue PT for a few more weeks; pt has surgery scheduled for tumor resection 05/18/21.    PT Treatment/Interventions ADLs/Self Care Home Management;Electrical Stimulation;Iontophoresis 53m/ml Dexamethasone;Moist Heat;Neuromuscular re-education;Balance training;Therapeutic exercise;Therapeutic activities;Functional mobility training;Stair training;Gait training;Patient/family education;Manual techniques;Dry needling;Passive range of motion    PT Next Visit Plan porgress HEP, core and hip strength    Consulted and Agree with Plan of Care Patient              Patient will benefit from skilled therapeutic intervention in order to improve the following deficits and impairments:  Abnormal gait, Decreased range of motion, Difficulty walking, Decreased endurance, Increased muscle spasms, Decreased activity tolerance, Pain, Decreased mobility, Decreased strength  Visit Diagnosis: Cramp and spasm  Muscle weakness (generalized)  Chronic bilateral low back pain with bilateral sciatica  Difficulty in walking, not elsewhere classified     Problem List There are no problems to display for this patient.  AAmador Cunas PT, DPT ADonald ProseSugg 04/09/2021, 5:33 PM  [place] [address]Phone: [phone]  Fax:  [phone] Name: Olivia YASSINMRN: [id number] of Birth: [birth year]

## 2021-04-14 ENCOUNTER — Other Ambulatory Visit: Payer: Self-pay

## 2021-04-14 ENCOUNTER — Encounter: Payer: Self-pay | Admitting: Physical Therapy

## 2021-04-14 ENCOUNTER — Ambulatory Visit: Payer: Medicare Other | Admitting: Physical Therapy

## 2021-04-14 DIAGNOSIS — R252 Cramp and spasm: Secondary | ICD-10-CM

## 2021-04-14 DIAGNOSIS — R262 Difficulty in walking, not elsewhere classified: Secondary | ICD-10-CM

## 2021-04-14 DIAGNOSIS — M5442 Lumbago with sciatica, left side: Secondary | ICD-10-CM | POA: Diagnosis not present

## 2021-04-14 DIAGNOSIS — G8929 Other chronic pain: Secondary | ICD-10-CM

## 2021-04-14 DIAGNOSIS — M6281 Muscle weakness (generalized): Secondary | ICD-10-CM

## 2021-04-14 NOTE — Therapy (Signed)
[place]. [place], [place], [address] Phone: [phone]   Fax:  [phone]  Physical Therapy Treatment  Patient Details  Name: Olivia Shah MRN: [id number] Date of Birth: [birth year] Referring Provider (PT): Rico Ala Date: 04/14/2021   PT End of Session - 04/14/21 1010     Visit Number 14    Date for PT Re-Evaluation 04/19/21    PT Start Time 0930    PT Stop Time 1012    PT Time Calculation (min) 42 min    Activity Tolerance Patient tolerated treatment well    Behavior During Therapy [facility] for tasks assessed/performed             History reviewed. No pertinent past medical history.  Past Surgical History:  Procedure Laterality Date   BREAST BIOPSY Right     There were no vitals filed for this visit.   Subjective Assessment - 04/14/21 0933     Subjective "I feel pretty good" had a little pain in the back this morning    Currently in Pain? Yes    Pain Score 2     Pain Location Back    Pain Orientation Left;Right                               [facility] Adult PT Treatment/Exercise - 04/14/21 0001       Lumbar Exercises: Stretches   Active Hamstring Stretch Right;Left;2 reps;20 seconds      Lumbar Exercises: Aerobic   Nustep L5 x 6 min      Lumbar Exercises: Machines for Strengthening   Cybex Knee Extension 5# 2x10 BLE    Cybex Knee Flexion 20# 2x10 BLE    Other Lumbar Machine Exercise Rows & Lats 25lb 2x10      Lumbar Exercises: Standing   Shoulder Extension Power Tower;20 reps;Both;Strengthening    Shoulder Extension Limitations 5    Other Standing Lumbar Exercises 4in step up x5 each      Lumbar Exercises: Seated   Sit to Stand 10 reps   x2, LE on airex                     PT Short Term Goals - 03/12/21 1729       PT SHORT TERM GOAL #1   Title Pt will be I with initial HEP    Status Achieved               PT Long Term Goals -  04/14/21 1011       PT LONG TERM GOAL #1   Title Pt will report 50% reduction in LBP    Status Partially Met      PT LONG TERM GOAL #2   Title Pt will report resolution of BLE radiating pain    Status Partially Met                   Plan - 04/14/21 1012     Clinical Impression Statement No issues with today's activity. Increase resistance tolerated with seated rows and lats. Cues for core engagement needed with shoulder extensions. Some difficulty on airex with sit to stands. No reports of increase pain reported.    Personal Factors and Comorbidities Comorbidity 1    Comorbidities HTN    Examination-Activity Limitations Stairs;Stand;Lift;Locomotion Level    Examination-Participation Restrictions Community Activity;Interpersonal Relationship    Stability/Clinical Decision  Making Stable/Uncomplicated    Rehab Potential Good    PT Frequency 2x / week    PT Duration 8 weeks    PT Treatment/Interventions ADLs/Self Care Home Management;Electrical Stimulation;Iontophoresis 81m/ml Dexamethasone;Moist Heat;Neuromuscular re-education;Balance training;Therapeutic exercise;Therapeutic activities;Functional mobility training;Stair training;Gait training;Patient/family education;Manual techniques;Dry needling;Passive range of motion    PT Next Visit Plan porgress HEP, core and hip strength             Patient will benefit from skilled therapeutic intervention in order to improve the following deficits and impairments:  Abnormal gait, Decreased range of motion, Difficulty walking, Decreased endurance, Increased muscle spasms, Decreased activity tolerance, Pain, Decreased mobility, Decreased strength  Visit Diagnosis: Muscle weakness (generalized)  Cramp and spasm  Chronic bilateral low back pain with bilateral sciatica  Difficulty in walking, not elsewhere classified     Problem List There are no problems to display for this patient.   RScot Jun PTA 04/14/2021,  10:15 AM  [address] [place] [address]Phone: [phone]  Fax:  [phone] Name: Olivia BRASSFIELDMRN: [id number] of Birth: 703-04-1956

## 2021-04-17 ENCOUNTER — Ambulatory Visit: Payer: Medicare Other | Admitting: Physical Therapy

## 2021-04-21 ENCOUNTER — Ambulatory Visit: Payer: Medicare Other | Admitting: Physical Therapy

## 2021-04-21 ENCOUNTER — Encounter: Payer: Self-pay | Admitting: Physical Therapy

## 2021-04-21 ENCOUNTER — Other Ambulatory Visit: Payer: Self-pay

## 2021-04-21 ENCOUNTER — Ambulatory Visit (HOSPITAL_COMMUNITY)
Admission: RE | Admit: 2021-04-21 | Discharge: 2021-04-21 | Disposition: A | Discharge status: Home / Self Care | Payer: Medicare Other | Source: Ambulatory Visit | Attending: Neurological Surgery | Admitting: Neurological Surgery

## 2021-04-21 DIAGNOSIS — M5441 Lumbago with sciatica, right side: Secondary | ICD-10-CM

## 2021-04-21 DIAGNOSIS — R252 Cramp and spasm: Secondary | ICD-10-CM

## 2021-04-21 DIAGNOSIS — R262 Difficulty in walking, not elsewhere classified: Secondary | ICD-10-CM

## 2021-04-21 DIAGNOSIS — D320 Benign neoplasm of cerebral meninges: Secondary | ICD-10-CM

## 2021-04-21 DIAGNOSIS — M5442 Lumbago with sciatica, left side: Secondary | ICD-10-CM | POA: Diagnosis not present

## 2021-04-21 DIAGNOSIS — G8929 Other chronic pain: Secondary | ICD-10-CM

## 2021-04-21 DIAGNOSIS — M6281 Muscle weakness (generalized): Secondary | ICD-10-CM

## 2021-04-21 MED ORDER — GADOBUTROL 1 MMOL/ML IV SOLN
8.5000 mL | Freq: Once | INTRAVENOUS | Status: AC | PRN
  Administered 2021-04-21: 8.5 mL via INTRAVENOUS

## 2021-04-21 NOTE — Therapy (Signed)
[place]. [place], [place], [address] Phone: [phone]   Fax:  [phone]  Physical Therapy Treatment  Patient Details  Name: Olivia Shah MRN: [id number] Date of Birth: 13-Feb-1956 Referring Provider (PT): Rico Ala Date: 04/21/2021   PT End of Session - 04/21/21 1012     Visit Number 15    PT Start Time 0927    PT Stop Time 1013    PT Time Calculation (min) 46 min             History reviewed. No pertinent past medical history.  Past Surgical History:  Procedure Laterality Date   BREAST BIOPSY Right     There were no vitals filed for this visit.   Subjective Assessment - 04/21/21 0929     Subjective "I feel pretty good"  a little pain in the back of legs when she walks    Currently in Pain? Yes    Pain Score 4     Pain Location --   HS area   Pain Orientation Right;Left    Multiple Pain Sites No                               [facility] Adult PT Treatment/Exercise - 04/21/21 0001       Ambulation/Gait   Ambulation/Gait Yes    Assistive device None    Gait Pattern Lateral trunk lean to left    Ambulation Surface Unlevel;Outdoor    Gait Comments outside around [place] in the back, up and slope      Lumbar Exercises: Stretches   Active Hamstring Stretch 4 reps;10 seconds;20 seconds      Lumbar Exercises: Aerobic   Nustep L5 x 6 min      Lumbar Exercises: Machines for Strengthening   Cybex Knee Extension 5# 2x12 BLE    Cybex Knee Flexion 20# 2x12 BLE    Other Lumbar Machine Exercise Rows & Lats 25lb 2x10    Other Lumbar Machine Exercise Shoulder Ext 5lb 2x10                      PT Short Term Goals - 03/12/21 1729       PT SHORT TERM GOAL #1   Title Pt will be I with initial HEP    Status Achieved               PT Long Term Goals - 04/21/21 1018       PT LONG TERM GOAL #1   Title Pt will report 50% reduction in LBP    Status  Partially Met      PT LONG TERM GOAL #2   Title Pt will report resolution of BLE radiating pain    Status Partially Met      PT LONG TERM GOAL #3   Title Pt will demo lumbar AROM <25% limited with no increase in LBP    Status Achieved      PT LONG TERM GOAL #4   Title Pt will report able to walk >20 minutes with no increase in LBP    Status On-going                   Plan - 04/21/21 1013     Clinical Impression Statement Did more outdoor ambulation, again pt reports a pull in her Hs with ambulation. Some increase fatigue noted  with uphill ambulation. Tactile cue for posture needed with seated lat pull and standing shoulder extensions. Good HS flexibility with supine passive HS stretches.    Personal Factors and Comorbidities Comorbidity 1    Comorbidities HTN    Examination-Activity Limitations Stairs;Stand;Lift;Locomotion Level    Examination-Participation Restrictions Community Activity;Interpersonal Relationship    Stability/Clinical Decision Making Stable/Uncomplicated    Rehab Potential Good    PT Frequency 2x / week    PT Duration 8 weeks    PT Treatment/Interventions ADLs/Self Care Home Management;Electrical Stimulation;Iontophoresis 42m/ml Dexamethasone;Moist Heat;Neuromuscular re-education;Balance training;Therapeutic exercise;Therapeutic activities;Functional mobility training;Stair training;Gait training;Patient/family education;Manual techniques;Dry needling;Passive range of motion    PT Next Visit Plan porgress HEP, core and hip strength             Patient will benefit from skilled therapeutic intervention in order to improve the following deficits and impairments:  Abnormal gait, Decreased range of motion, Difficulty walking, Decreased endurance, Increased muscle spasms, Decreased activity tolerance, Pain, Decreased mobility, Decreased strength  Visit Diagnosis: Chronic bilateral low back pain with bilateral sciatica  Cramp and spasm  Muscle weakness  (generalized)  Difficulty in walking, not elsewhere classified     Problem List There are no problems to display for this patient.   RScot Jun7/26/2022, 10:19 AM  [place] [address]Phone: [phone]  Fax:  [phone] Name: Olivia PARDIMRN: [id number] of Birth: 730-Mar-1957

## 2021-04-24 ENCOUNTER — Ambulatory Visit: Payer: Medicare Other | Admitting: Physical Therapy

## 2021-04-24 ENCOUNTER — Encounter: Payer: Self-pay | Admitting: Physical Therapy

## 2021-04-24 ENCOUNTER — Other Ambulatory Visit: Payer: Self-pay

## 2021-04-24 DIAGNOSIS — G8929 Other chronic pain: Secondary | ICD-10-CM

## 2021-04-24 DIAGNOSIS — R252 Cramp and spasm: Secondary | ICD-10-CM

## 2021-04-24 DIAGNOSIS — M5442 Lumbago with sciatica, left side: Secondary | ICD-10-CM

## 2021-04-24 DIAGNOSIS — R262 Difficulty in walking, not elsewhere classified: Secondary | ICD-10-CM

## 2021-04-24 DIAGNOSIS — M6281 Muscle weakness (generalized): Secondary | ICD-10-CM

## 2021-04-24 NOTE — Therapy (Signed)
[place]. [place], [place], [address] Phone: [phone]   Fax:  [phone]  Physical Therapy Treatment  Patient Details  Name: Olivia Shah MRN: [id number] Date of Birth: 1956/06/25 Referring Provider (PT): Rico Ala Date: 04/24/2021   PT End of Session - 04/24/21 0925     Visit Number 16    Date for PT Re-Evaluation 05/22/21    PT Start Time 0845    PT Stop Time 0930    PT Time Calculation (min) 45 min    Activity Tolerance Patient tolerated treatment well    Behavior During Therapy [facility] for tasks assessed/performed             History reviewed. No pertinent past medical history.  Past Surgical History:  Procedure Laterality Date   BREAST BIOPSY Right     There were no vitals filed for this visit.   Subjective Assessment - 04/24/21 0843     Subjective Yesterday was pretty bad, pulling sensation in the hamstring when she walks.    Limitations Standing;Walking    Currently in Pain? No/denies                               [facility] Adult PT Treatment/Exercise - 04/24/21 0001       Lumbar Exercises: Stretches   Gastroc Stretch Right;Left;4 reps;10 seconds      Lumbar Exercises: Aerobic   Recumbent Bike L2 x 5 min    Nustep L5 x 5 min      Lumbar Exercises: Machines for Strengthening   Other Lumbar Machine Exercise Rows & Lats 20lb 2x12      Lumbar Exercises: Standing   Row Strengthening;20 reps;Both    Row Limitations 10    Shoulder Extension Power Tower;Both;Strengthening;15 reps   x2   Shoulder Extension Limitations 5    Other Standing Lumbar Exercises 4in step up x5 each      Lumbar Exercises: Seated   Sit to Stand 20 reps   OHP yellow ball                     PT Short Term Goals - 03/12/21 1729       PT SHORT TERM GOAL #1   Title Pt will be I with initial HEP    Status Achieved               PT Long Term Goals - 04/21/21 1018        PT LONG TERM GOAL #1   Title Pt will report 50% reduction in LBP    Status Partially Met      PT LONG TERM GOAL #2   Title Pt will report resolution of BLE radiating pain    Status Partially Met      PT LONG TERM GOAL #3   Title Pt will demo lumbar AROM <25% limited with no increase in LBP    Status Achieved      PT LONG TERM GOAL #4   Title Pt will report able to walk >20 minutes with no increase in LBP    Status On-going                   Plan - 04/24/21 0926     Clinical Impression Statement Added more aerobic warm up for LE endurance. Tactile cues needed to maintain erect posture with shoulder extensions. Pt did  report a pull in the LE with standing rows. Pt reports a good stretch with gastroc stretch utilizing black bar. No report of pain. Some fatigue with sit to stands    Personal Factors and Comorbidities Comorbidity 1    Comorbidities HTN    Examination-Activity Limitations Stairs;Stand;Lift;Locomotion Level    Examination-Participation Restrictions Community Activity;Interpersonal Relationship    Stability/Clinical Decision Making Stable/Uncomplicated    Rehab Potential Good    PT Frequency 2x / week    PT Duration 8 weeks    PT Treatment/Interventions ADLs/Self Care Home Management;Electrical Stimulation;Iontophoresis 30m/ml Dexamethasone;Moist Heat;Neuromuscular re-education;Balance training;Therapeutic exercise;Therapeutic activities;Functional mobility training;Stair training;Gait training;Patient/family education;Manual techniques;Dry needling;Passive range of motion    PT Next Visit Plan core and hip strength             Patient will benefit from skilled therapeutic intervention in order to improve the following deficits and impairments:  Abnormal gait, Decreased range of motion, Difficulty walking, Decreased endurance, Increased muscle spasms, Decreased activity tolerance, Pain, Decreased mobility, Decreased strength  Visit  Diagnosis: Chronic bilateral low back pain with bilateral sciatica  Cramp and spasm  Muscle weakness (generalized)  Difficulty in walking, not elsewhere classified     Problem List There are no problems to display for this patient.   RScot Jun7/29/2022, 9:29 AM  [place] [address]Phone: [phone]  Fax:  [phone] Name: Olivia SHACKETTMRN: [id number] of Birth: 7August 02, 1957

## 2021-04-28 ENCOUNTER — Ambulatory Visit: Payer: Medicare Other | Attending: Neurosurgery | Admitting: Physical Therapy

## 2021-04-28 ENCOUNTER — Other Ambulatory Visit: Payer: Self-pay

## 2021-04-28 ENCOUNTER — Encounter: Payer: Self-pay | Admitting: Physical Therapy

## 2021-04-28 DIAGNOSIS — R262 Difficulty in walking, not elsewhere classified: Secondary | ICD-10-CM | POA: Diagnosis present

## 2021-04-28 DIAGNOSIS — M5442 Lumbago with sciatica, left side: Secondary | ICD-10-CM | POA: Insufficient documentation

## 2021-04-28 DIAGNOSIS — M5441 Lumbago with sciatica, right side: Secondary | ICD-10-CM | POA: Diagnosis present

## 2021-04-28 DIAGNOSIS — M6281 Muscle weakness (generalized): Secondary | ICD-10-CM | POA: Diagnosis present

## 2021-04-28 DIAGNOSIS — G8929 Other chronic pain: Secondary | ICD-10-CM

## 2021-04-28 DIAGNOSIS — R252 Cramp and spasm: Secondary | ICD-10-CM | POA: Insufficient documentation

## 2021-04-28 NOTE — Therapy (Signed)
[place]. [place], [place], [address] Phone: [phone]   Fax:  [phone]  Physical Therapy Treatment  Patient Details  Name: Olivia Shah MRN: [id number] Date of Birth: 1956-03-10 Referring Provider (PT): Rico Ala Date: 04/28/2021   PT End of Session - 04/28/21 1010     Visit Number 17    Date for PT Re-Evaluation 05/22/21    PT Start Time 0930    PT Stop Time 1011    PT Time Calculation (min) 41 min    Activity Tolerance Patient tolerated treatment well    Behavior During Therapy [facility] for tasks assessed/performed             History reviewed. No pertinent past medical history.  Past Surgical History:  Procedure Laterality Date   BREAST BIOPSY Right     There were no vitals filed for this visit.   Subjective Assessment - 04/28/21 0930     Subjective "Feeling good" Some pain in the back of her legs but not as bad    Currently in Pain? Yes    Pain Score 4     Pain Orientation Right;Left;Upper;Posterior                               [facility] Adult PT Treatment/Exercise - 04/28/21 0001       Lumbar Exercises: Stretches   Passive Hamstring Stretch Left;Right;4 reps;10 seconds;20 seconds    Single Knee to Chest Stretch Left;Right;2 reps;10 seconds    Piriformis Stretch Right;Left;3 reps;10 seconds      Lumbar Exercises: Aerobic   Recumbent Bike L2 x 4 min    Nustep L4 x 5 min      Lumbar Exercises: Machines for Strengthening   Cybex Knee Flexion 20# 2x12 BLE    Other Lumbar Machine Exercise Rows & Lats 20lb 2x12      Lumbar Exercises: Standing   Row Strengthening;20 reps;Both    Row Limitations 10      Lumbar Exercises: Seated   Sit to Stand 10 reps   holding blue ball     Lumbar Exercises: Supine   Bridge Compliant;20 reps;2 seconds                      PT Short Term Goals - 03/12/21 1729       PT SHORT TERM GOAL #1   Title Pt will be  I with initial HEP    Status Achieved               PT Long Term Goals - 04/21/21 1018       PT LONG TERM GOAL #1   Title Pt will report 50% reduction in LBP    Status Partially Met      PT LONG TERM GOAL #2   Title Pt will report resolution of BLE radiating pain    Status Partially Met      PT LONG TERM GOAL #3   Title Pt will demo lumbar AROM <25% limited with no increase in LBP    Status Achieved      PT LONG TERM GOAL #4   Title Pt will report able to walk >20 minutes with no increase in LBP    Status On-going                   Plan - 04/28/21 1011  Clinical Impression Statement All interventions completed well. Added supine bridges but will little hip elevation, cues to engage core given before each rep. Some LE weakness noted with step ups L>R, pt unable to achieve full TKE when stepping up with LLE. Tactile cues for posture needed with standing rows.    Personal Factors and Comorbidities Comorbidity 1    Examination-Activity Limitations Stairs;Stand;Lift;Locomotion Level    Examination-Participation Restrictions Community Activity;Interpersonal Relationship    Stability/Clinical Decision Making Stable/Uncomplicated    Rehab Potential Good    PT Frequency 2x / week    PT Duration 8 weeks    PT Treatment/Interventions ADLs/Self Care Home Management;Electrical Stimulation;Iontophoresis 61m/ml Dexamethasone;Moist Heat;Neuromuscular re-education;Balance training;Therapeutic exercise;Therapeutic activities;Functional mobility training;Stair training;Gait training;Patient/family education;Manual techniques;Dry needling;Passive range of motion    PT Next Visit Plan core and hip strength             Patient will benefit from skilled therapeutic intervention in order to improve the following deficits and impairments:  Abnormal gait, Decreased range of motion, Difficulty walking, Decreased endurance, Increased muscle spasms, Decreased activity tolerance,  Pain, Decreased mobility, Decreased strength  Visit Diagnosis: Cramp and spasm  Muscle weakness (generalized)  Difficulty in walking, not elsewhere classified  Chronic bilateral low back pain with bilateral sciatica     Problem List There are no problems to display for this patient.   RScot Jun8/10/2020, 10:13 AM  [place] [address]Phone: [phone]  Fax:  [phone] Name: Olivia CARDERMRN: [id number] of Birth: 7May 12, 1957

## 2021-04-30 ENCOUNTER — Other Ambulatory Visit: Payer: Self-pay

## 2021-04-30 ENCOUNTER — Ambulatory Visit: Payer: Medicare Other | Admitting: Physical Therapy

## 2021-04-30 DIAGNOSIS — R252 Cramp and spasm: Secondary | ICD-10-CM

## 2021-04-30 DIAGNOSIS — M6281 Muscle weakness (generalized): Secondary | ICD-10-CM

## 2021-04-30 DIAGNOSIS — G8929 Other chronic pain: Secondary | ICD-10-CM

## 2021-04-30 DIAGNOSIS — R262 Difficulty in walking, not elsewhere classified: Secondary | ICD-10-CM

## 2021-04-30 NOTE — Therapy (Signed)
[place]. [place], [place], [address] Phone: [phone]   Fax:  [phone]  Physical Therapy Treatment  Patient Details  Name: Olivia Shah MRN: [id number] Date of Birth: 1956/02/16 Referring Provider (PT): Rico Ala Date: 04/30/2021   PT End of Session - 04/30/21 1744     Visit Number 18    Date for PT Re-Evaluation 05/22/21    PT Start Time 1700    PT Stop Time 1745    PT Time Calculation (min) 45 min             No past medical history on file.  Past Surgical History:  Procedure Laterality Date   BREAST BIOPSY Right     There were no vitals filed for this visit.   Subjective Assessment - 04/30/21 1653     Subjective doing okay. having brain surgery 8/22 would like to continue PT until then.    Currently in Pain? Yes    Pain Score 3     Pain Location Leg    Pain Orientation Posterior                               [facility] Adult PT Treatment/Exercise - 04/30/21 0001       Lumbar Exercises: Aerobic   Nustep L 5 6 min      Lumbar Exercises: Machines for Strengthening   Cybex Lumbar Extension black tband 2 sets 10   flex and ext   Other Lumbar Machine Exercise Rows & Lats 20lb 2x12      Lumbar Exercises: Standing   Heel Raises 20 reps   black bar   Other Standing Lumbar Exercises 10# AR press 10 x each way    Other Standing Lumbar Exercises 4in step up x 10 each opp hip ext   4 in step up laterally opp hip abd 10 x     Lumbar Exercises: Supine   Clam 20 reps   green tband   Bent Knee Raise 10 reps   green tband   Bridge with [employer] 10 reps;Compliant      Manual Therapy   Manual Therapy Passive ROM    Manual therapy comments LE, hamstring, ITB, pirifomris                      PT Short Term Goals - 03/12/21 1729       PT SHORT TERM GOAL #1   Title Pt will be I with initial HEP    Status Achieved               PT Long Term  Goals - 04/30/21 1744       PT LONG TERM GOAL #1   Title Pt will report 50% reduction in LBP    Status Partially Met      PT LONG TERM GOAL #2   Title Pt will report resolution of BLE radiating pain    Status Partially Met      PT LONG TERM GOAL #3   Title Pt will demo lumbar AROM <25% limited with no increase in LBP    Status Achieved      PT LONG TERM GOAL #4   Title Pt will report able to walk >20 minutes with no increase in LBP    Baseline reports not limited by back pain now; limited by legs    Status  Partially Met                   Plan - 04/30/21 1745     Clinical Impression Statement pt is making progress and getting stronger ,less pain and increased func reportedpt still having posterior leg pain and LLE weaker than RT . Cued with ex for core activation.    PT Treatment/Interventions ADLs/Self Care Home Management;Electrical Stimulation;Iontophoresis 44m/ml Dexamethasone;Moist Heat;Neuromuscular re-education;Balance training;Therapeutic exercise;Therapeutic activities;Functional mobility training;Stair training;Gait training;Patient/family education;Manual techniques;Dry needling;Passive range of motion    PT Next Visit Plan core and hip strength             Patient will benefit from skilled therapeutic intervention in order to improve the following deficits and impairments:  Abnormal gait, Decreased range of motion, Difficulty walking, Decreased endurance, Increased muscle spasms, Decreased activity tolerance, Pain, Decreased mobility, Decreased strength  Visit Diagnosis: Muscle weakness (generalized)  Cramp and spasm  Difficulty in walking, not elsewhere classified  Chronic bilateral low back pain with bilateral sciatica     Problem List There are no problems to display for this patient.   James Senn,ANGIE PTA 04/30/2021, 5:49 PM  [place] [address]Phone:  [phone]  Fax:  [phone] Name: JMARKELLA DAOMRN: [id number] of Birth: [birth year]

## 2021-05-07 ENCOUNTER — Ambulatory Visit: Payer: Medicare Other | Admitting: Physical Therapy

## 2021-05-07 ENCOUNTER — Other Ambulatory Visit: Payer: Self-pay

## 2021-05-07 ENCOUNTER — Encounter: Payer: Self-pay | Admitting: Physical Therapy

## 2021-05-07 DIAGNOSIS — R252 Cramp and spasm: Secondary | ICD-10-CM

## 2021-05-07 DIAGNOSIS — R262 Difficulty in walking, not elsewhere classified: Secondary | ICD-10-CM

## 2021-05-07 DIAGNOSIS — G8929 Other chronic pain: Secondary | ICD-10-CM

## 2021-05-07 DIAGNOSIS — M6281 Muscle weakness (generalized): Secondary | ICD-10-CM

## 2021-05-07 NOTE — Therapy (Signed)
[place]. [place], [place], [address] Phone: [phone]   Fax:  [phone]  Physical Therapy Treatment  Patient Details  Name: Olivia Shah MRN: [id number] Date of Birth: [birth year] Referring Provider (PT): Rico Ala Date: 05/07/2021   PT End of Session - 05/07/21 1556     Visit Number 19    Date for PT Re-Evaluation 05/22/21    PT Start Time 4196    PT Stop Time 1557    PT Time Calculation (min) 42 min    Activity Tolerance Patient tolerated treatment well    Behavior During Therapy [facility] for tasks assessed/performed             History reviewed. No pertinent past medical history.  Past Surgical History:  Procedure Laterality Date   BREAST BIOPSY Right     There were no vitals filed for this visit.   Subjective Assessment - 05/07/21 1516     Subjective Feeling fine    Currently in Pain? No/denies                               [facility] Adult PT Treatment/Exercise - 05/07/21 0001       Lumbar Exercises: Aerobic   Nustep L 5 6 min      Lumbar Exercises: Machines for Strengthening   Cybex Knee Extension 5# 2x12 BLE    Cybex Knee Flexion 25lb 2x10    Other Lumbar Machine Exercise Rows & Lats 25lb 2x10      Lumbar Exercises: Standing   Other Standing Lumbar Exercises Resisted gait 30lb    Other Standing Lumbar Exercises 4in step up HA x2 x10 each,      Lumbar Exercises: Seated   Sit to Stand 10 reps   x2 LE on airex                     PT Short Term Goals - 03/12/21 1729       PT SHORT TERM GOAL #1   Title Pt will be I with initial HEP    Status Achieved               PT Long Term Goals - 04/30/21 1744       PT LONG TERM GOAL #1   Title Pt will report 50% reduction in LBP    Status Partially Met      PT LONG TERM GOAL #2   Title Pt will report resolution of BLE radiating pain    Status Partially Met      PT LONG TERM GOAL #3    Title Pt will demo lumbar AROM <25% limited with no increase in LBP    Status Achieved      PT LONG TERM GOAL #4   Title Pt will report able to walk >20 minutes with no increase in LBP    Baseline reports not limited by back pain now; limited by legs    Status Partially Met                   Plan - 05/07/21 1557     Clinical Impression Statement Pt continues to make progress overall. No issues with resisted gait. UE assist needed with step ups. Increase resistance tolerated with all machine level interventions. Some instability with S2S on airex.    Personal Factors and Comorbidities Comorbidity 1  Comorbidities HTN    Examination-Activity Limitations Stairs;Stand;Lift;Locomotion Level    Examination-Participation Restrictions Community Activity;Interpersonal Relationship    Stability/Clinical Decision Making Stable/Uncomplicated    Rehab Potential Good    PT Frequency 2x / week    PT Treatment/Interventions ADLs/Self Care Home Management;Electrical Stimulation;Iontophoresis 37m/ml Dexamethasone;Moist Heat;Neuromuscular re-education;Balance training;Therapeutic exercise;Therapeutic activities;Functional mobility training;Stair training;Gait training;Patient/family education;Manual techniques;Dry needling;Passive range of motion    PT Next Visit Plan core and hip strength             Patient will benefit from skilled therapeutic intervention in order to improve the following deficits and impairments:  Abnormal gait, Decreased range of motion, Difficulty walking, Decreased endurance, Increased muscle spasms, Decreased activity tolerance, Pain, Decreased mobility, Decreased strength  Visit Diagnosis: Difficulty in walking, not elsewhere classified  Cramp and spasm  Muscle weakness (generalized)  Chronic bilateral low back pain with bilateral sciatica     Problem List There are no problems to display for this patient.   RScot Jun8/07/2021, 3:59  PM  [place] [address]Phone: [phone]  Fax:  [phone] Name: Olivia KUSHMRN: [id number] of Birth: [birth year]

## 2021-05-08 ENCOUNTER — Ambulatory Visit: Payer: Medicare Other | Admitting: Rehabilitative and Restorative Service Providers"

## 2021-05-08 NOTE — Progress Notes (Addendum)
Surgical Instructions    Your procedure is scheduled on Monday, August 22nd, 2022.   Report to [facility] Main Entrance "A" at 05:30 A.M., then check in with the Admitting office.  Call this number if you have problems the morning of surgery:  [phone]   If you have any questions prior to your surgery date call [phone]: Open Monday-Friday 8am-4pm    Remember:  Do not eat after midnight the night before your surgery  You may drink clear liquids until 04:30 the morning of your surgery.   Clear liquids allowed are: Water, Non-Citrus Juices (without pulp), Carbonated Beverages, Clear Tea, Black Coffee Only, and Gatorade    Take these medicines the morning of surgery with A SIP OF WATER:  amLODipine (NORVASC)  dorzolamide-timolol (COSOPT)   Follow your surgeon's instructions on when to stop Aspirin.  If no instructions were given by your surgeon then you will need to call the office to get those instructions.     As of today, STOP taking any Aspirin (unless otherwise instructed by your surgeon) Aleve, Naproxen, Ibuprofen, Motrin, Advil, Goody's, BC's, all herbal medications, fish oil, and all vitamins.  WHAT DO I DO ABOUT MY DIABETES MEDICATION?   Do not take metFORMIN (GLUCOPHAGE) the morning of surgery.  HOW TO MANAGE YOUR DIABETES BEFORE AND AFTER SURGERY  Why is it important to control my blood sugar before and after surgery? Improving blood sugar levels before and after surgery helps healing and can limit problems. A way of improving blood sugar control is eating a healthy diet by:  Eating less sugar and carbohydrates  Increasing activity/exercise  Talking with your doctor about reaching your blood sugar goals High blood sugars (greater than 180 mg/dL) can raise your risk of infections and slow your recovery, so you will need to focus on controlling your diabetes during the weeks before surgery. Make sure that the doctor who takes care of your diabetes knows about  your planned surgery including the date and location.  How do I manage my blood sugar before surgery? Check your blood sugar at least 4 times a day, starting 2 days before surgery, to make sure that the level is not too high or low.  Check your blood sugar the morning of your surgery when you wake up and every 2 hours until you get to the Short Stay unit.  If your blood sugar is less than 70 mg/dL, you will need to treat for low blood sugar: Do not take insulin. Treat a low blood sugar (less than 70 mg/dL) with  cup of clear juice (cranberry or apple), 4 glucose tablets, OR glucose gel. Recheck blood sugar in 15 minutes after treatment (to make sure it is greater than 70 mg/dL). If your blood sugar is not greater than 70 mg/dL on recheck, call [phone] for further instructions. Report your blood sugar to the short stay nurse when you get to Short Stay.  If you are admitted to the hospital after surgery: Your blood sugar will be checked by the staff and you will probably be given insulin after surgery (instead of oral diabetes medicines) to make sure you have good blood sugar levels. The goal for blood sugar control after surgery is 80-180 mg/dL.            Do not wear jewelry or makeup Do not wear lotions, powders, perfumes, or deodorant. Do not shave 48 hours prior to surgery.   Do not bring valuables to the hospital. DO Not wear nail  polish, gel polish, artificial nails, or any other type of covering on natural nails including finger and toenails. If patients have artificial nails, gel coating, etc. that need to be removed by a nail salon please have this removed prior to surgery or surgery may need to be canceled/delayed if the surgeon/ anesthesia feels like the patient is unable to be adequately monitored.             [facility] is not responsible for any belongings or valuables.  Do NOT Smoke (Tobacco/Vaping) or drink Alcohol 24 hours prior to your procedure If you use a CPAP  at night, you may bring all equipment for your overnight stay.   Contacts, glasses, dentures or bridgework may not be worn into surgery, please bring cases for these belongings   For patients admitted to the hospital, discharge time will be determined by your treatment team.   Patients discharged the day of surgery will not be allowed to drive home, and someone needs to stay with them for 24 hours.  ONLY 1 SUPPORT PERSON MAY BE PRESENT WHILE YOU ARE IN SURGERY. IF YOU ARE TO BE ADMITTED ONCE YOU ARE IN YOUR ROOM YOU WILL BE ALLOWED TWO (2) VISITORS.  Minor children may have two parents present. Special consideration for safety and communication needs will be reviewed on a case by case basis.  Special instructions:    Oral Hygiene is also important to reduce your risk of infection.  Remember - BRUSH YOUR TEETH THE MORNING OF SURGERY WITH YOUR REGULAR TOOTHPASTE   [place]- Preparing For Surgery  Before surgery, you can play an important role. Because skin is not sterile, your skin needs to be as free of germs as possible. You can reduce the number of germs on your skin by washing with CHG (chlorahexidine gluconate) Soap before surgery.  CHG is an antiseptic cleaner which kills germs and bonds with the skin to continue killing germs even after washing.     Please do not use if you have an allergy to CHG or antibacterial soaps. If your skin becomes reddened/irritated stop using the CHG.  Do not shave (including legs and underarms) for at least 48 hours prior to first CHG shower. It is OK to shave your face.  Please follow these instructions carefully.     Shower the NIGHT BEFORE SURGERY and the MORNING OF SURGERY with CHG Soap.   If you chose to wash your hair, wash your hair first as usual with your normal shampoo. After you shampoo, rinse your hair and body thoroughly to remove the shampoo.  Then [employer] and genitals (private parts) with your normal soap and rinse thoroughly to remove  soap.  After that Use CHG Soap as you would any other liquid soap. You can apply CHG directly to the skin and wash gently with a scrungie or a clean washcloth.   Apply the CHG Soap to your body ONLY FROM THE NECK DOWN.  Do not use on open wounds or open sores. Avoid contact with your eyes, ears, mouth and genitals (private parts). Wash Face and genitals (private parts)  with your normal soap.   Wash thoroughly, paying special attention to the area where your surgery will be performed.  Thoroughly rinse your body with warm water from the neck down.  DO NOT shower/wash with your normal soap after using and rinsing off the CHG Soap.  Pat yourself dry with a CLEAN TOWEL.  Wear CLEAN PAJAMAS to bed the night before  surgery  Place CLEAN SHEETS on your bed the night before your surgery  DO NOT SLEEP WITH PETS.   Day of Surgery:  Take a shower with CHG soap. Wear Clean/Comfortable clothing the morning of surgery Do not apply any deodorants/lotions.   Remember to brush your teeth WITH YOUR REGULAR TOOTHPASTE.   Please read over the following fact sheets that you were given.

## 2021-05-11 ENCOUNTER — Other Ambulatory Visit: Payer: Self-pay

## 2021-05-11 ENCOUNTER — Encounter (HOSPITAL_COMMUNITY)
Admission: RE | Admit: 2021-05-11 | Discharge: 2021-05-11 | Disposition: A | Discharge status: Home / Self Care | Payer: Medicare Other | Source: Ambulatory Visit | Attending: Neurological Surgery | Admitting: Neurological Surgery

## 2021-05-11 ENCOUNTER — Encounter (HOSPITAL_COMMUNITY): Payer: Self-pay

## 2021-05-11 DIAGNOSIS — Z01812 Encounter for preprocedural laboratory examination: Secondary | ICD-10-CM | POA: Insufficient documentation

## 2021-05-11 HISTORY — DX: Unspecified osteoarthritis, unspecified site: M19.90

## 2021-05-11 HISTORY — DX: Type 2 diabetes mellitus without complications: E11.9

## 2021-05-11 HISTORY — DX: Essential (primary) hypertension: I10

## 2021-05-11 LAB — CBC
HCT: 45.2 % (ref 36.0–46.0)
Hemoglobin: 14.5 g/dL (ref 12.0–15.0)
MCH: 28.3 pg (ref 26.0–34.0)
MCHC: 32.1 g/dL (ref 30.0–36.0)
MCV: 88.3 fL (ref 80.0–100.0)
Platelets: 324 10*3/uL (ref 150–400)
RBC: 5.12 MIL/uL — ABNORMAL HIGH (ref 3.87–5.11)
RDW: 14.4 % (ref 11.5–15.5)
WBC: 9.2 10*3/uL (ref 4.0–10.5)
nRBC: 0 % (ref 0.0–0.2)

## 2021-05-11 LAB — BASIC METABOLIC PANEL
Anion gap: 8 (ref 5–15)
BUN: 13 mg/dL (ref 8–23)
CO2: 25 mmol/L (ref 22–32)
Calcium: 9.1 mg/dL (ref 8.9–10.3)
Chloride: 102 mmol/L (ref 98–111)
Creatinine, Ser: 0.91 mg/dL (ref 0.44–1.00)
GFR, Estimated: >60 mL/min (ref >60)
Glucose, Bld: 151 mg/dL — ABNORMAL HIGH (ref 70–99)
Potassium: 3.9 mmol/L (ref 3.5–5.1)
Sodium: 135 mmol/L (ref 135–145)

## 2021-05-11 LAB — HEMOGLOBIN A1C
Hgb A1c MFr Bld: 6.6 % — ABNORMAL HIGH (ref 4.8–5.6)
Mean Plasma Glucose: 142.72 mg/dL

## 2021-05-11 LAB — TYPE AND SCREEN
ABO/RH(D): B POS
Antibody Screen: NEGATIVE

## 2021-05-11 LAB — GLUCOSE, CAPILLARY: Glucose-Capillary: 146 mg/dL — ABNORMAL HIGH (ref 70–99)

## 2021-05-11 NOTE — Progress Notes (Signed)
PCP - Polite, Runner, broadcasting/film/video - denies  PPM/ICD - denies Device Orders - N/A Rep Notified - N/A  Chest x-ray - N/A EKG - 05/11/2021 Stress Test - denies ECHO - denies Cardiac Cath - denies  Sleep Study - denies CPAP - N/A  Fasting Blood Sugar - 117 Checks Blood Sugar - patient is not checking CBG regularly CBG today - 146 A1C on 05/11/2021  Blood Thinner Instructions: N/A  Aspirin Instructions: Aspirin - last dose 05/04/2021 per patient  Patient was instructed: As of today, STOP taking any Aspirin (unless otherwise instructed by your surgeon) Aleve, Naproxen, Ibuprofen, Motrin, Advil, Goody's, BC's, all herbal medications, fish oil, and all vitamins.  ERAS Protcol - No  COVID TEST- Patient was instructed to go to the [facility] testing site on 05/14/2021   Anesthesia review: yes  Patient denies shortness of breath, fever, cough and chest pain at PAT appointment   All instructions explained to the patient, with a verbal understanding of the material. Patient agrees to go over the instructions while at home for a better understanding. Patient also instructed to self quarantine after being tested for COVID-19. The opportunity to ask questions was provided.

## 2021-05-12 ENCOUNTER — Ambulatory Visit: Payer: Medicare Other | Admitting: Physical Therapy

## 2021-05-12 ENCOUNTER — Encounter: Payer: Self-pay | Admitting: Physical Therapy

## 2021-05-12 DIAGNOSIS — R262 Difficulty in walking, not elsewhere classified: Secondary | ICD-10-CM

## 2021-05-12 DIAGNOSIS — R252 Cramp and spasm: Secondary | ICD-10-CM | POA: Diagnosis not present

## 2021-05-12 DIAGNOSIS — M5442 Lumbago with sciatica, left side: Secondary | ICD-10-CM

## 2021-05-12 DIAGNOSIS — G8929 Other chronic pain: Secondary | ICD-10-CM

## 2021-05-12 DIAGNOSIS — M6281 Muscle weakness (generalized): Secondary | ICD-10-CM

## 2021-05-12 NOTE — Therapy (Signed)
[address] [place], [place], [address] Phone: [phone]   Fax:  [phone] Progress Note Reporting Period 04/03/21 to 05/12/21 for visits 11-20  See note below for Objective Data and Assessment of Progress/Goals.     Physical Therapy Treatment  Patient Details  Name: Olivia Shah MRN: [id number] Date of Birth: 06-01-1956 Referring Provider (PT): Rico Ala Date: 05/12/2021   PT End of Session - 05/12/21 1008     Visit Number 20    Date for PT Re-Evaluation 05/22/21    PT Start Time 0927    PT Stop Time 1010    PT Time Calculation (min) 43 min    Activity Tolerance Patient tolerated treatment well    Behavior During Therapy [facility] for tasks assessed/performed             Past Medical History:  Diagnosis Date   Arthritis    Diabetes mellitus without complication ([place])    Hypertension     Past Surgical History:  Procedure Laterality Date   BREAST BIOPSY Right    CHOLECYSTECTOMY     EYE SURGERY     as a child   HERNIA REPAIR      There were no vitals filed for this visit.   Subjective Assessment - 05/12/21 0928     Subjective "All right" Haven't had any sleep thinking about the surgery    Currently in Pain? Yes    Pain Score 4     Pain Location Leg    Pain Orientation Right                               [facility] Adult PT Treatment/Exercise - 05/12/21 0001       Ambulation/Gait   Ambulation/Gait Yes    Assistive device None    Gait Pattern Lateral trunk lean to left    Ambulation Surface Unlevel;Outdoor;Paved    Gait Comments outside around side and front island  up and down slope      Lumbar Exercises: Stretches   Press photographer Left;Right;4 reps;20 seconds      Lumbar Exercises: Machines for Strengthening   Cybex Knee Extension 5# 2x12 BLE    Cybex Knee Flexion 20lb 2x12    Other Lumbar Machine Exercise Rows & Lats 25lb 2x10      Lumbar Exercises:  Standing   Shoulder Extension 20 reps;Both;Strengthening    Theraband Level (Shoulder Extension) Level 4 (Blue)    Other Standing Lumbar Exercises 10lb row pulley 2x10    Other Standing Lumbar Exercises 6in step up 2x5  HHA x2            AR pressed blue band x 10 each          PT Short Term Goals - 03/12/21 1729       PT SHORT TERM GOAL #1   Title Pt will be I with initial HEP    Status Achieved               PT Long Term Goals - 05/12/21 1008       PT LONG TERM GOAL #1   Title Pt will report 50% reduction in LBP    Status Partially Met      PT LONG TERM GOAL #2   Title Pt will report resolution of BLE radiating pain    Status Partially Met      PT  LONG TERM GOAL #3   Title Pt will demo lumbar AROM <25% limited with no increase in LBP    Status Achieved      PT LONG TERM GOAL #4   Title Pt will report able to walk >20 minutes with no increase in LBP    Status Partially Met                   Plan - 05/12/21 1009     Clinical Impression Statement Pt reports improvement overall but continues to have low back and hamstring pain/discomfort at times. Progressed with more outdoor ambulation today with a increase in symptoms. I have reinforced to pt a multiple of times to increase her activity at home. Does well with all machine level interventions. Some weakness noted with anti rotational presses.    Personal Factors and Co morbidities Comorbidity 1    Examination-Activity Limitations Stairs;Stand;Lift;Locomotion Level    Examination-Participation Restrictions Community Activity;Interpersonal Relationship    Stability/Clinical Decision Making Stable/Uncomplicated    Rehab Potential Good    PT Frequency 2x / week    PT Duration 8 weeks    PT Treatment/Interventions ADLs/Self Care Home Management;Electrical Stimulation;Iontophoresis 43m/ml Dexamethasone;Moist Heat;Neuromuscular re-education;Balance training;Therapeutic exercise;Therapeutic  activities;Functional mobility training;Stair training;Gait training;Patient/family education;Manual techniques;Dry needling;Passive range of motion    PT Next Visit Plan core and hip strength             Patient will benefit from skilled therapeutic intervention in order to improve the following deficits and impairments:  Abnormal gait, Decreased range of motion, Difficulty walking, Decreased endurance, Increased muscle spasms, Decreased activity tolerance, Pain, Decreased mobility, Decreased strength  Visit Diagnosis: Difficulty in walking, not elsewhere classified  Muscle weakness (generalized)  Cramp and spasm  Chronic bilateral low back pain with bilateral sciatica     Problem List There are no problems to display for this patient.   RScot Jun8/16/2022, 10:12 AM  [place] [address]Phone: [phone]  Fax:  [phone] Name: Olivia MAKAMRN: [id number] of Birth: 7Mar 15, 1957

## 2021-05-14 ENCOUNTER — Other Ambulatory Visit: Payer: Self-pay | Admitting: Neurological Surgery

## 2021-05-15 ENCOUNTER — Encounter: Payer: Self-pay | Admitting: Physical Therapy

## 2021-05-15 ENCOUNTER — Other Ambulatory Visit: Payer: Self-pay

## 2021-05-15 ENCOUNTER — Ambulatory Visit: Payer: Medicare Other | Admitting: Physical Therapy

## 2021-05-15 DIAGNOSIS — R252 Cramp and spasm: Secondary | ICD-10-CM

## 2021-05-15 DIAGNOSIS — M6281 Muscle weakness (generalized): Secondary | ICD-10-CM

## 2021-05-15 DIAGNOSIS — R262 Difficulty in walking, not elsewhere classified: Secondary | ICD-10-CM

## 2021-05-15 DIAGNOSIS — G8929 Other chronic pain: Secondary | ICD-10-CM

## 2021-05-15 LAB — SARS CORONAVIRUS 2 (TAT 6-24 HRS): SARS Coronavirus 2: NEGATIVE

## 2021-05-15 NOTE — Therapy (Signed)
[address]. [place], [place], [address] Phone: [phone]   Fax:  [phone]  Physical Therapy Treatment  Patient Details  Name: Olivia Shah MRN: [id number] Date of Birth: 1955-12-08 Referring Provider (PT): Saintclair Halsted   Encounter Date: 05/15/2021   PT End of Session - 05/15/21 1008     Visit Number 21    Date for PT Re-Evaluation 05/22/21    PT Start Time 0930    PT Stop Time 1010    PT Time Calculation (min) 40 min    Activity Tolerance Patient tolerated treatment well    Behavior During Therapy [facility] for tasks assessed/performed             Past Medical History:  Diagnosis Date   Arthritis    Diabetes mellitus without complication ([place])    Hypertension     Past Surgical History:  Procedure Laterality Date   BREAST BIOPSY Right    CHOLECYSTECTOMY     EYE SURGERY     as a child   HERNIA REPAIR      There were no vitals filed for this visit.   Subjective Assessment - 05/15/21 0933     Subjective "Feeling good"    Pertinent History HTN    Currently in Pain? Yes    Pain Score 2     Pain Location Leg   HS area   Pain Orientation Left;Right                               [facility] Adult PT Treatment/Exercise - 05/15/21 0001       Lumbar Exercises: Aerobic   Recumbent Bike L2 x 5 min    Nustep L 5 6 min      Lumbar Exercises: Machines for Strengthening   Cybex Knee Extension 5# 2x12 BLE    Cybex Knee Flexion 25lb 2x12    Other Lumbar Machine Exercise Rows & Lats 25lb 2x12      Lumbar Exercises: Standing   Shoulder Extension 20 reps;Both;Strengthening    Shoulder Extension Limitations 5      Lumbar Exercises: Seated   Sit to Stand 10 reps   x2 LE on airex                     PT Short Term Goals - 03/12/21 1729       PT SHORT TERM GOAL #1   Title Pt will be I with initial HEP    Status Achieved               PT Long Term Goals - 05/15/21  1011       PT LONG TERM GOAL #1   Title Pt will report 50% reduction in LBP    Status Partially Met      PT LONG TERM GOAL #2   Title Pt will report resolution of BLE radiating pain    Status Partially Met      PT LONG TERM GOAL #3   Title Pt will demo lumbar AROM <25% limited with no increase in LBP    Status Achieved      PT LONG TERM GOAL #4   Title Pt will report able to walk >20 minutes with no increase in LBP    Status Partially Met      PT LONG TERM GOAL #5   Title Pt will demo increase in FOTO score  to 68%    Status On-going                   Plan - 05/15/21 1008     Clinical Impression Statement Pt did well today, no issues completing the interventions. some postural weakness noted with standing shoulder extensions. Increase reps tolerated with all machine level interventions. Pt reports that's she is scheduled for surgery Monday and this would be her last day.    Personal Factors and Comorbidities Comorbidity 1    Comorbidities HTN    Examination-Activity Limitations Stairs;Stand;Lift;Locomotion Level    Examination-Participation Restrictions Community Activity;Interpersonal Relationship    Stability/Clinical Decision Making Stable/Uncomplicated    Rehab Potential Good    PT Frequency 2x / week    PT Next Visit Plan D/C PT             Patient will benefit from skilled therapeutic intervention in order to improve the following deficits and impairments:  Abnormal gait, Decreased range of motion, Difficulty walking, Decreased endurance, Increased muscle spasms, Decreased activity tolerance, Pain, Decreased mobility, Decreased strength  Visit Diagnosis: Muscle weakness (generalized)  Cramp and spasm  Difficulty in walking, not elsewhere classified  Chronic bilateral low back pain with bilateral sciatica     Problem List There are no problems to display for this patient.  PHYSICAL THERAPY DISCHARGE SUMMARY  Visits from Start of Care:  21   Patient agrees to discharge. Patient goals were partially met. Patient is being discharged due to having surgery   Scot Jun, PTA 05/15/2021, 10:12 AM  [address] [place], [place], [address] Phone: [phone]   Fax:  [phone]  Name: Olivia Shah MRN: [id number] Date of Birth: 20-Jan-1956

## 2021-05-18 ENCOUNTER — Other Ambulatory Visit: Payer: Self-pay

## 2021-05-18 ENCOUNTER — Inpatient Hospital Stay (HOSPITAL_COMMUNITY): Payer: Medicare Other

## 2021-05-18 ENCOUNTER — Inpatient Hospital Stay (HOSPITAL_COMMUNITY)
Admission: RE | Admit: 2021-05-18 | Discharge: 2021-06-04 | DRG: 003 | Disposition: A | Discharge status: Other Acute Inpatient Hospital | Payer: Medicare Other | Attending: Neurological Surgery | Admitting: Neurological Surgery

## 2021-05-18 ENCOUNTER — Encounter (HOSPITAL_COMMUNITY)
Admission: RE | Disposition: A | Discharge status: Nursing Home (Includes ICF) | Payer: Self-pay | Source: Home / Self Care | Attending: Neurological Surgery

## 2021-05-18 ENCOUNTER — Inpatient Hospital Stay (HOSPITAL_COMMUNITY): Payer: Medicare Other | Admitting: Physician Assistant

## 2021-05-18 ENCOUNTER — Encounter (HOSPITAL_COMMUNITY): Payer: Self-pay | Admitting: Neurological Surgery

## 2021-05-18 DIAGNOSIS — Z6835 Body mass index (BMI) 35.0-35.9, adult: Secondary | ICD-10-CM

## 2021-05-18 DIAGNOSIS — G40901 Epilepsy, unspecified, not intractable, with status epilepticus: Secondary | ICD-10-CM

## 2021-05-18 DIAGNOSIS — G40001 Localization-related (focal) (partial) idiopathic epilepsy and epileptic syndromes with seizures of localized onset, not intractable, with status epilepticus: Secondary | ICD-10-CM | POA: Diagnosis not present

## 2021-05-18 DIAGNOSIS — E669 Obesity, unspecified: Secondary | ICD-10-CM | POA: Diagnosis present

## 2021-05-18 DIAGNOSIS — G40101 Localization-related (focal) (partial) symptomatic epilepsy and epileptic syndromes with simple partial seizures, not intractable, with status epilepticus: Secondary | ICD-10-CM | POA: Diagnosis not present

## 2021-05-18 DIAGNOSIS — Z7984 Long term (current) use of oral hypoglycemic drugs: Secondary | ICD-10-CM | POA: Diagnosis not present

## 2021-05-18 DIAGNOSIS — J690 Pneumonitis due to inhalation of food and vomit: Secondary | ICD-10-CM | POA: Diagnosis not present

## 2021-05-18 DIAGNOSIS — E8809 Other disorders of plasma-protein metabolism, not elsewhere classified: Secondary | ICD-10-CM | POA: Diagnosis not present

## 2021-05-18 DIAGNOSIS — Z930 Tracheostomy status: Secondary | ICD-10-CM

## 2021-05-18 DIAGNOSIS — J9601 Acute respiratory failure with hypoxia: Secondary | ICD-10-CM

## 2021-05-18 DIAGNOSIS — E1165 Type 2 diabetes mellitus with hyperglycemia: Secondary | ICD-10-CM | POA: Diagnosis not present

## 2021-05-18 DIAGNOSIS — E778 Other disorders of glycoprotein metabolism: Secondary | ICD-10-CM | POA: Diagnosis not present

## 2021-05-18 DIAGNOSIS — G9389 Other specified disorders of brain: Secondary | ICD-10-CM | POA: Diagnosis not present

## 2021-05-18 DIAGNOSIS — E119 Type 2 diabetes mellitus without complications: Secondary | ICD-10-CM | POA: Diagnosis present

## 2021-05-18 DIAGNOSIS — H547 Unspecified visual loss: Secondary | ICD-10-CM | POA: Diagnosis present

## 2021-05-18 DIAGNOSIS — Z91018 Allergy to other foods: Secondary | ICD-10-CM

## 2021-05-18 DIAGNOSIS — D496 Neoplasm of unspecified behavior of brain: Secondary | ICD-10-CM | POA: Diagnosis present

## 2021-05-18 DIAGNOSIS — M1990 Unspecified osteoarthritis, unspecified site: Secondary | ICD-10-CM | POA: Diagnosis present

## 2021-05-18 DIAGNOSIS — Z4659 Encounter for fitting and adjustment of other gastrointestinal appliance and device: Secondary | ICD-10-CM | POA: Diagnosis not present

## 2021-05-18 DIAGNOSIS — H5461 Unqualified visual loss, right eye, normal vision left eye: Secondary | ICD-10-CM | POA: Diagnosis present

## 2021-05-18 DIAGNOSIS — I10 Essential (primary) hypertension: Secondary | ICD-10-CM | POA: Diagnosis present

## 2021-05-18 DIAGNOSIS — R061 Stridor: Secondary | ICD-10-CM | POA: Diagnosis not present

## 2021-05-18 DIAGNOSIS — R739 Hyperglycemia, unspecified: Secondary | ICD-10-CM | POA: Diagnosis not present

## 2021-05-18 DIAGNOSIS — R569 Unspecified convulsions: Secondary | ICD-10-CM | POA: Diagnosis not present

## 2021-05-18 DIAGNOSIS — L509 Urticaria, unspecified: Secondary | ICD-10-CM | POA: Diagnosis present

## 2021-05-18 DIAGNOSIS — Z01818 Encounter for other preprocedural examination: Secondary | ICD-10-CM

## 2021-05-18 DIAGNOSIS — I6389 Other cerebral infarction: Secondary | ICD-10-CM | POA: Diagnosis not present

## 2021-05-18 DIAGNOSIS — Z91013 Allergy to seafood: Secondary | ICD-10-CM | POA: Diagnosis not present

## 2021-05-18 DIAGNOSIS — G9340 Encephalopathy, unspecified: Secondary | ICD-10-CM | POA: Diagnosis not present

## 2021-05-18 DIAGNOSIS — Z978 Presence of other specified devices: Secondary | ICD-10-CM

## 2021-05-18 DIAGNOSIS — G40009 Localization-related (focal) (partial) idiopathic epilepsy and epileptic syndromes with seizures of localized onset, not intractable, without status epilepticus: Secondary | ICD-10-CM | POA: Diagnosis not present

## 2021-05-18 DIAGNOSIS — J189 Pneumonia, unspecified organism: Secondary | ICD-10-CM

## 2021-05-18 DIAGNOSIS — D320 Benign neoplasm of cerebral meninges: Principal | ICD-10-CM | POA: Diagnosis present

## 2021-05-18 DIAGNOSIS — R1310 Dysphagia, unspecified: Secondary | ICD-10-CM | POA: Diagnosis not present

## 2021-05-18 HISTORY — PX: CRANIOTOMY: SHX93

## 2021-05-18 HISTORY — PX: APPLICATION OF CRANIAL NAVIGATION: SHX6578

## 2021-05-18 LAB — CBC
HCT: 41.9 % (ref 36.0–46.0)
Hemoglobin: 13.4 g/dL (ref 12.0–15.0)
MCH: 28.3 pg (ref 26.0–34.0)
MCHC: 32 g/dL (ref 30.0–36.0)
MCV: 88.4 fL (ref 80.0–100.0)
Platelets: 214 10*3/uL (ref 150–400)
RBC: 4.74 MIL/uL (ref 3.87–5.11)
RDW: 14.8 % (ref 11.5–15.5)
WBC: 11.2 10*3/uL — ABNORMAL HIGH (ref 4.0–10.5)
nRBC: 0 % (ref 0.0–0.2)

## 2021-05-18 LAB — ABO/RH: ABO/RH(D): B POS

## 2021-05-18 LAB — CREATININE, SERUM
Creatinine, Ser: 0.73 mg/dL (ref 0.44–1.00)
GFR, Estimated: >60 mL/min (ref >60)

## 2021-05-18 LAB — MRSA NEXT GEN BY PCR, NASAL: MRSA by PCR Next Gen: NOT DETECTED

## 2021-05-18 LAB — GLUCOSE, CAPILLARY: Glucose-Capillary: 151 mg/dL — ABNORMAL HIGH (ref 70–99)

## 2021-05-18 SURGERY — CRANIOTOMY TUMOR EXCISION
Anesthesia: General | Laterality: Right

## 2021-05-18 MED ORDER — DOCUSATE SODIUM 100 MG PO CAPS
100.0000 mg | ORAL_CAPSULE | Freq: Two times a day (BID) | ORAL | Status: DC
  Administered 2021-05-18 – 2021-05-19 (×3): 100 mg via ORAL
  Filled 2021-05-18 (×5): qty 1

## 2021-05-18 MED ORDER — METFORMIN HCL 500 MG PO TABS: 500.0000 mg | ORAL_TABLET | ORAL | Status: DC

## 2021-05-18 MED ORDER — PHENYLEPHRINE HCL-NACL 20-0.9 MG/250ML-% IV SOLN
INTRAVENOUS | Status: DC | PRN
  Administered 2021-05-18: 30 ug/min via INTRAVENOUS

## 2021-05-18 MED ORDER — MANNITOL 25 % IV SOLN
INTRAVENOUS | Status: DC | PRN
  Administered 2021-05-18: 85 g via INTRAVENOUS

## 2021-05-18 MED ORDER — HYDROCHLOROTHIAZIDE 12.5 MG PO CAPS
12.5000 mg | ORAL_CAPSULE | Freq: Every day | ORAL | Status: DC
  Administered 2021-05-19: 12.5 mg via ORAL
  Filled 2021-05-18 (×2): qty 1

## 2021-05-18 MED ORDER — LACTATED RINGERS IV SOLN: INTRAVENOUS | Status: DC

## 2021-05-18 MED ORDER — METFORMIN HCL 500 MG PO TABS
1000.0000 mg | ORAL_TABLET | Freq: Every day | ORAL | Status: DC
  Administered 2021-05-19: 1000 mg via ORAL
  Filled 2021-05-18 (×2): qty 2

## 2021-05-18 MED ORDER — VITAMIN E 45 MG (100 UNIT) PO CAPS
400.0000 [IU] | ORAL_CAPSULE | Freq: Every day | ORAL | Status: DC
  Administered 2021-05-19: 400 [IU] via ORAL
  Filled 2021-05-18 (×3): qty 4

## 2021-05-18 MED ORDER — PROPOFOL 10 MG/ML IV BOLUS
INTRAVENOUS | Status: AC
  Filled 2021-05-18: qty 40

## 2021-05-18 MED ORDER — ROCURONIUM BROMIDE 10 MG/ML (PF) SYRINGE
PREFILLED_SYRINGE | INTRAVENOUS | Status: DC | PRN
  Administered 2021-05-18: 40 mg via INTRAVENOUS
  Administered 2021-05-18: 60 mg via INTRAVENOUS
  Administered 2021-05-18: 40 mg via INTRAVENOUS

## 2021-05-18 MED ORDER — SODIUM CHLORIDE 0.9 % IV SOLN: INTRAVENOUS | Status: DC

## 2021-05-18 MED ORDER — 0.9 % SODIUM CHLORIDE (POUR BTL) OPTIME
TOPICAL | Status: DC | PRN
  Administered 2021-05-18: 4000 mL

## 2021-05-18 MED ORDER — PHENYLEPHRINE 40 MCG/ML (10ML) SYRINGE FOR IV PUSH (FOR BLOOD PRESSURE SUPPORT)
PREFILLED_SYRINGE | INTRAVENOUS | Status: DC | PRN
  Administered 2021-05-18 (×6): 80 ug via INTRAVENOUS

## 2021-05-18 MED ORDER — LIDOCAINE 2% (20 MG/ML) 5 ML SYRINGE
INTRAMUSCULAR | Status: AC
  Filled 2021-05-18: qty 5

## 2021-05-18 MED ORDER — HEPARIN SODIUM (PORCINE) 5000 UNIT/ML IJ SOLN
5000.0000 [IU] | Freq: Three times a day (TID) | INTRAMUSCULAR | Status: DC
  Administered 2021-05-20 – 2021-06-04 (×47): 5000 [IU] via SUBCUTANEOUS
  Filled 2021-05-18 (×47): qty 1

## 2021-05-18 MED ORDER — FENTANYL CITRATE (PF) 100 MCG/2ML IJ SOLN
25.0000 ug | INTRAMUSCULAR | Status: DC | PRN
  Administered 2021-05-18: 25 ug via INTRAVENOUS
  Administered 2021-05-18: 50 ug via INTRAVENOUS
  Administered 2021-05-18: 25 ug via INTRAVENOUS

## 2021-05-18 MED ORDER — DORZOLAMIDE HCL-TIMOLOL MAL 2-0.5 % OP SOLN
1.0000 [drp] | Freq: Two times a day (BID) | OPHTHALMIC | Status: DC
  Administered 2021-05-18 – 2021-06-03 (×33): 1 [drp] via OPHTHALMIC
  Filled 2021-05-18: qty 10

## 2021-05-18 MED ORDER — METOPROLOL TARTRATE 50 MG PO TABS
200.0000 mg | ORAL_TABLET | Freq: Every day | ORAL | Status: DC
  Administered 2021-05-18: 200 mg via ORAL
  Filled 2021-05-18 (×3): qty 4

## 2021-05-18 MED ORDER — HYDROCODONE-ACETAMINOPHEN 5-325 MG PO TABS
1.0000 | ORAL_TABLET | ORAL | Status: DC | PRN
Start: 2021-05-18 — End: 2021-05-20
  Administered 2021-05-18 – 2021-05-19 (×3): 1 via ORAL
  Filled 2021-05-18 (×4): qty 1

## 2021-05-18 MED ORDER — ONDANSETRON HCL 4 MG/2ML IJ SOLN
INTRAMUSCULAR | Status: DC | PRN
  Administered 2021-05-18: 4 mg via INTRAVENOUS

## 2021-05-18 MED ORDER — AMLODIPINE BESYLATE 10 MG PO TABS
10.0000 mg | ORAL_TABLET | Freq: Every day | ORAL | Status: DC
  Administered 2021-05-19: 10 mg via ORAL
  Filled 2021-05-18 (×2): qty 1

## 2021-05-18 MED ORDER — POLYETHYLENE GLYCOL 3350 17 G PO PACK: 17.0000 g | PACK | Freq: Every day | ORAL | Status: DC | PRN

## 2021-05-18 MED ORDER — PROPOFOL 10 MG/ML IV BOLUS
INTRAVENOUS | Status: DC | PRN
  Administered 2021-05-18: 50 mg via INTRAVENOUS
  Administered 2021-05-18: 100 mg via INTRAVENOUS
  Administered 2021-05-18: 20 mg via INTRAVENOUS

## 2021-05-18 MED ORDER — PROMETHAZINE HCL 25 MG PO TABS: 12.5000 mg | ORAL_TABLET | ORAL | Status: DC | PRN

## 2021-05-18 MED ORDER — LATANOPROST 0.005 % OP SOLN
1.0000 [drp] | Freq: Every day | OPHTHALMIC | Status: DC
  Administered 2021-05-18 – 2021-06-03 (×17): 1 [drp] via OPHTHALMIC
  Filled 2021-05-18: qty 2.5

## 2021-05-18 MED ORDER — FENTANYL CITRATE (PF) 250 MCG/5ML IJ SOLN
INTRAMUSCULAR | Status: DC | PRN
  Administered 2021-05-18: 100 ug via INTRAVENOUS
  Administered 2021-05-18 (×3): 50 ug via INTRAVENOUS

## 2021-05-18 MED ORDER — ROCURONIUM BROMIDE 10 MG/ML (PF) SYRINGE
PREFILLED_SYRINGE | INTRAVENOUS | Status: AC
  Filled 2021-05-18: qty 10

## 2021-05-18 MED ORDER — CEFAZOLIN SODIUM-DEXTROSE 2-3 GM-%(50ML) IV SOLR
INTRAVENOUS | Status: DC | PRN
  Administered 2021-05-18: 2 g via INTRAVENOUS

## 2021-05-18 MED ORDER — IRBESARTAN 150 MG PO TABS
150.0000 mg | ORAL_TABLET | Freq: Every day | ORAL | Status: DC
  Administered 2021-05-19: 150 mg via ORAL
  Filled 2021-05-18 (×2): qty 1

## 2021-05-18 MED ORDER — ACETAMINOPHEN 650 MG RE SUPP: 650.0000 mg | RECTAL | Status: DC | PRN

## 2021-05-18 MED ORDER — CEFAZOLIN SODIUM-DEXTROSE 2-4 GM/100ML-% IV SOLN
2.0000 g | INTRAVENOUS | Status: DC
  Filled 2021-05-18: qty 100

## 2021-05-18 MED ORDER — ORAL CARE MOUTH RINSE
15.0000 mL | Freq: Two times a day (BID) | OROMUCOSAL | Status: DC
  Administered 2021-05-18 – 2021-05-20 (×5): 15 mL via OROMUCOSAL

## 2021-05-18 MED ORDER — PHENYLEPHRINE 40 MCG/ML (10ML) SYRINGE FOR IV PUSH (FOR BLOOD PRESSURE SUPPORT)
PREFILLED_SYRINGE | INTRAVENOUS | Status: AC
  Filled 2021-05-18: qty 20

## 2021-05-18 MED ORDER — DEXAMETHASONE SODIUM PHOSPHATE 10 MG/ML IJ SOLN
INTRAMUSCULAR | Status: AC
  Filled 2021-05-18: qty 1

## 2021-05-18 MED ORDER — SODIUM CHLORIDE 0.9 % IV SOLN: INTRAVENOUS | Status: DC | PRN

## 2021-05-18 MED ORDER — CHLORHEXIDINE GLUCONATE 0.12 % MT SOLN
15.0000 mL | Freq: Once | OROMUCOSAL | Status: AC
  Administered 2021-05-18: 15 mL via OROMUCOSAL

## 2021-05-18 MED ORDER — MANNITOL 20 % IV SOLN: INTRAVENOUS | Status: DC | PRN

## 2021-05-18 MED ORDER — MIDAZOLAM HCL 2 MG/2ML IJ SOLN
INTRAMUSCULAR | Status: AC
  Filled 2021-05-18: qty 2

## 2021-05-18 MED ORDER — ADULT MULTIVITAMIN W/MINERALS CH
1.0000 | ORAL_TABLET | Freq: Every day | ORAL | Status: DC
  Administered 2021-05-18 – 2021-05-19 (×2): 1 via ORAL
  Filled 2021-05-18 (×3): qty 1

## 2021-05-18 MED ORDER — LIDOCAINE 2% (20 MG/ML) 5 ML SYRINGE
INTRAMUSCULAR | Status: DC | PRN
  Administered 2021-05-18: 60 mg via INTRAVENOUS

## 2021-05-18 MED ORDER — THROMBIN 5000 UNITS EX SOLR
CUTANEOUS | Status: AC
  Filled 2021-05-18: qty 5000

## 2021-05-18 MED ORDER — METFORMIN HCL 500 MG PO TABS
500.0000 mg | ORAL_TABLET | Freq: Every day | ORAL | Status: DC
  Administered 2021-05-18: 500 mg via ORAL
  Filled 2021-05-18: qty 1

## 2021-05-18 MED ORDER — THROMBIN 20000 UNITS EX SOLR
CUTANEOUS | Status: AC
  Filled 2021-05-18: qty 20000

## 2021-05-18 MED ORDER — FENTANYL CITRATE (PF) 250 MCG/5ML IJ SOLN
INTRAMUSCULAR | Status: AC
  Filled 2021-05-18: qty 5

## 2021-05-18 MED ORDER — ACETAMINOPHEN 325 MG PO TABS
650.0000 mg | ORAL_TABLET | ORAL | Status: DC | PRN
  Administered 2021-05-19 (×2): 650 mg via ORAL
  Filled 2021-05-18 (×2): qty 2

## 2021-05-18 MED ORDER — IRBESARTAN-HYDROCHLOROTHIAZIDE 150-12.5 MG PO TABS: 1.0000 | ORAL_TABLET | Freq: Every day | ORAL | Status: DC

## 2021-05-18 MED ORDER — ONDANSETRON HCL 4 MG PO TABS: 4.0000 mg | ORAL_TABLET | ORAL | Status: DC | PRN

## 2021-05-18 MED ORDER — BACITRACIN ZINC 500 UNIT/GM EX OINT
TOPICAL_OINTMENT | CUTANEOUS | Status: DC | PRN
  Administered 2021-05-18 (×2): 1 via TOPICAL

## 2021-05-18 MED ORDER — THROMBIN 5000 UNITS EX SOLR
OROMUCOSAL | Status: DC | PRN
  Administered 2021-05-18: 5 mL via TOPICAL

## 2021-05-18 MED ORDER — ONDANSETRON HCL 4 MG/2ML IJ SOLN
4.0000 mg | INTRAMUSCULAR | Status: DC | PRN
  Administered 2021-05-18 – 2021-05-21 (×4): 4 mg via INTRAVENOUS
  Filled 2021-05-18 (×4): qty 2

## 2021-05-18 MED ORDER — ONDANSETRON HCL 4 MG/2ML IJ SOLN
INTRAMUSCULAR | Status: AC
  Filled 2021-05-18: qty 2

## 2021-05-18 MED ORDER — CEFAZOLIN SODIUM-DEXTROSE 2-4 GM/100ML-% IV SOLN
2.0000 g | Freq: Three times a day (TID) | INTRAVENOUS | Status: AC
Start: 2021-05-18 — End: 2021-05-19
  Administered 2021-05-18 – 2021-05-19 (×2): 2 g via INTRAVENOUS
  Filled 2021-05-18 (×2): qty 100

## 2021-05-18 MED ORDER — LABETALOL HCL 5 MG/ML IV SOLN
10.0000 mg | INTRAVENOUS | Status: DC | PRN
  Administered 2021-05-18 (×2): 20 mg via INTRAVENOUS
  Filled 2021-05-18 (×2): qty 4

## 2021-05-18 MED ORDER — MIDAZOLAM HCL 2 MG/2ML IJ SOLN
INTRAMUSCULAR | Status: DC | PRN
  Administered 2021-05-18: 1 mg via INTRAVENOUS

## 2021-05-18 MED ORDER — ONDANSETRON HCL 4 MG/2ML IJ SOLN
4.0000 mg | Freq: Once | INTRAMUSCULAR | Status: AC | PRN
  Administered 2021-05-18: 4 mg via INTRAVENOUS

## 2021-05-18 MED ORDER — AMISULPRIDE (ANTIEMETIC) 5 MG/2ML IV SOLN: 10.0000 mg | Freq: Once | INTRAVENOUS | Status: DC | PRN

## 2021-05-18 MED ORDER — BACITRACIN ZINC 500 UNIT/GM EX OINT
TOPICAL_OINTMENT | CUTANEOUS | Status: AC
  Filled 2021-05-18: qty 28.35

## 2021-05-18 MED ORDER — ACETAMINOPHEN 10 MG/ML IV SOLN: 1000.0000 mg | Freq: Once | INTRAVENOUS | Status: DC | PRN

## 2021-05-18 MED ORDER — LIDOCAINE-EPINEPHRINE 1 %-1:100000 IJ SOLN
INTRAMUSCULAR | Status: DC | PRN
  Administered 2021-05-18: 14 mL

## 2021-05-18 MED ORDER — CHLORHEXIDINE GLUCONATE CLOTH 2 % EX PADS: 6.0000 | MEDICATED_PAD | Freq: Once | CUTANEOUS | Status: DC

## 2021-05-18 MED ORDER — DEXAMETHASONE SODIUM PHOSPHATE 10 MG/ML IJ SOLN
INTRAMUSCULAR | Status: DC | PRN
  Administered 2021-05-18: 10 mg via INTRAVENOUS

## 2021-05-18 MED ORDER — LIDOCAINE-EPINEPHRINE 1 %-1:100000 IJ SOLN
INTRAMUSCULAR | Status: AC
  Filled 2021-05-18: qty 1

## 2021-05-18 MED ORDER — HYDROMORPHONE HCL 1 MG/ML IJ SOLN
0.5000 mg | INTRAMUSCULAR | Status: DC | PRN
  Administered 2021-05-18 – 2021-05-21 (×6): 0.5 mg via INTRAVENOUS
  Filled 2021-05-18 (×6): qty 1

## 2021-05-18 MED ORDER — EPHEDRINE SULFATE-NACL 50-0.9 MG/10ML-% IV SOSY
PREFILLED_SYRINGE | INTRAVENOUS | Status: DC | PRN
  Administered 2021-05-18 (×3): 5 mg via INTRAVENOUS

## 2021-05-18 MED ORDER — THROMBIN 20000 UNITS EX SOLR
CUTANEOUS | Status: DC | PRN
  Administered 2021-05-18: 20 mL via TOPICAL

## 2021-05-18 MED ORDER — CHLORHEXIDINE GLUCONATE CLOTH 2 % EX PADS
6.0000 | MEDICATED_PAD | Freq: Every day | CUTANEOUS | Status: DC
  Administered 2021-05-19 – 2021-06-04 (×12): 6 via TOPICAL

## 2021-05-18 MED ORDER — EPHEDRINE 5 MG/ML INJ
INTRAVENOUS | Status: AC
  Filled 2021-05-18: qty 5

## 2021-05-18 MED ORDER — CHLORHEXIDINE GLUCONATE 0.12 % MT SOLN
15.0000 mL | Freq: Once | OROMUCOSAL | Status: DC
  Filled 2021-05-18: qty 15

## 2021-05-18 MED ORDER — ORAL CARE MOUTH RINSE: 15.0000 mL | Freq: Once | OROMUCOSAL | Status: DC

## 2021-05-18 MED ORDER — ORAL CARE MOUTH RINSE: 15.0000 mL | Freq: Once | OROMUCOSAL | Status: AC

## 2021-05-18 MED ORDER — FENTANYL CITRATE (PF) 100 MCG/2ML IJ SOLN
INTRAMUSCULAR | Status: AC
  Filled 2021-05-18: qty 2

## 2021-05-18 SURGICAL SUPPLY — 95 items
APL SKNCLS STERI-STRIP NONHPOA (GAUZE/BANDAGES/DRESSINGS)
BAG COUNTER SPONGE SURGICOUNT (BAG) ×5 IMPLANT
BAG SPNG CNTER NS LX DISP (BAG) ×6
BAND INSRT 18 STRL LF DISP RB (MISCELLANEOUS) ×4
BAND RUBBER #18 3X1/16 STRL (MISCELLANEOUS) ×2 IMPLANT
BENZOIN TINCTURE PRP APPL 2/3 (GAUZE/BANDAGES/DRESSINGS) IMPLANT
BLADE CLIPPER SURG (BLADE) ×3 IMPLANT
BLADE SAW GIGLI 16 STRL (MISCELLANEOUS) IMPLANT
BLADE SURG 15 STRL LF DISP TIS (BLADE) IMPLANT
BLADE SURG 15 STRL SS (BLADE)
BNDG CMPR 75X41 PLY HI ABS (GAUZE/BANDAGES/DRESSINGS)
BNDG GAUZE ELAST 4 BULKY (GAUZE/BANDAGES/DRESSINGS) IMPLANT
BNDG STRETCH 4X75 STRL LF (GAUZE/BANDAGES/DRESSINGS) IMPLANT
BUR ACORN 9.0 PRECISION (BURR) ×3 IMPLANT
BUR PRECISION FLUTE 5.0 (BURR) ×1 IMPLANT
BUR ROUND FLUTED 4 SOFT TCH (BURR) IMPLANT
BUR SPIRAL ROUTER 2.3 (BUR) ×3 IMPLANT
CABLE BIPOLOR RESECTION CORD (MISCELLANEOUS) ×1 IMPLANT
CANISTER SUCT 3000ML PPV (MISCELLANEOUS) ×6 IMPLANT
CATH VENTRIC 35X38 W/TROCAR LG (CATHETERS) IMPLANT
CLIP VESOCCLUDE MED 6/CT (CLIP) IMPLANT
CNTNR URN SCR LID CUP LEK RST (MISCELLANEOUS) ×2 IMPLANT
CONT SPEC 4OZ STRL OR WHT (MISCELLANEOUS) ×3
COVER BURR HOLE 7 (Orthopedic Implant) ×1 IMPLANT
COVER MAYO STAND STRL (DRAPES) IMPLANT
DECANTER SPIKE VIAL GLASS SM (MISCELLANEOUS) ×2 IMPLANT
DRAIN SUBARACHNOID (WOUND CARE) IMPLANT
DRAPE HALF SHEET 40X57 (DRAPES) ×3 IMPLANT
DRAPE MICROSCOPE LEICA (MISCELLANEOUS) ×1 IMPLANT
DRAPE NEUROLOGICAL W/INCISE (DRAPES) ×3 IMPLANT
DRAPE STERI IOBAN 125X83 (DRAPES) IMPLANT
DRAPE SURG 17X23 STRL (DRAPES) IMPLANT
DRAPE WARM FLUID 44X44 (DRAPES) ×3 IMPLANT
DRSG ADAPTIC 3X8 NADH LF (GAUZE/BANDAGES/DRESSINGS) IMPLANT
DRSG TELFA 3X8 NADH (GAUZE/BANDAGES/DRESSINGS) IMPLANT
DURAPREP 6ML APPLICATOR 50/CS (WOUND CARE) ×4 IMPLANT
ELECT REM PT RETURN 9FT ADLT (ELECTROSURGICAL) ×3
ELECTRODE REM PT RTRN 9FT ADLT (ELECTROSURGICAL) ×2 IMPLANT
EVACUATOR 1/8 PVC DRAIN (DRAIN) IMPLANT
EVACUATOR SILICONE 100CC (DRAIN) IMPLANT
FORCEPS BIPOLAR SPETZLER 8 1.0 (NEUROSURGERY SUPPLIES) ×4 IMPLANT
GAUZE 4X4 16PLY ~~LOC~~+RFID DBL (SPONGE) ×2 IMPLANT
GAUZE SPONGE 4X4 12PLY STRL (GAUZE/BANDAGES/DRESSINGS) IMPLANT
GLOVE EXAM NITRILE LRG STRL (GLOVE) IMPLANT
GLOVE EXAM NITRILE XS STR PU (GLOVE) IMPLANT
GLOVE SURG ENC MOIS LTX SZ7 (GLOVE) IMPLANT
GLOVE SURG LTX SZ7.5 (GLOVE) ×3 IMPLANT
GLOVE SURG UNDER POLY LF SZ7 (GLOVE) IMPLANT
GLOVE SURG UNDER POLY LF SZ7.5 (GLOVE) ×3 IMPLANT
GOWN STRL REUS W/ TWL LRG LVL3 (GOWN DISPOSABLE) ×4 IMPLANT
GOWN STRL REUS W/ TWL XL LVL3 (GOWN DISPOSABLE) IMPLANT
GOWN STRL REUS W/TWL 2XL LVL3 (GOWN DISPOSABLE) IMPLANT
GOWN STRL REUS W/TWL LRG LVL3 (GOWN DISPOSABLE) ×6
GOWN STRL REUS W/TWL XL LVL3 (GOWN DISPOSABLE)
HEMOSTAT POWDER KIT SURGIFOAM (HEMOSTASIS) ×3 IMPLANT
HEMOSTAT SURGICEL 2X14 (HEMOSTASIS) ×2 IMPLANT
IV NS 1000ML (IV SOLUTION)
IV NS 1000ML BAXH (IV SOLUTION) ×2 IMPLANT
KIT BASIN OR (CUSTOM PROCEDURE TRAY) ×3 IMPLANT
KIT DRAIN CSF ACCUDRAIN (MISCELLANEOUS) IMPLANT
KIT TURNOVER KIT B (KITS) ×3 IMPLANT
MARKER SPHERE PSV REFLC 13MM (MARKER) ×8 IMPLANT
NDL SPNL 18GX3.5 QUINCKE PK (NEEDLE) IMPLANT
NEEDLE HYPO 22GX1.5 SAFETY (NEEDLE) ×3 IMPLANT
NEEDLE SPNL 18GX3.5 QUINCKE PK (NEEDLE) IMPLANT
NS IRRIG 1000ML POUR BTL (IV SOLUTION) ×10 IMPLANT
PACK CRANIOTOMY CUSTOM (CUSTOM PROCEDURE TRAY) ×3 IMPLANT
PAD DRESSING TELFA 3X8 NADH (GAUZE/BANDAGES/DRESSINGS) IMPLANT
PATTIES SURGICAL .25X.25 (GAUZE/BANDAGES/DRESSINGS) IMPLANT
PATTIES SURGICAL .5 X.5 (GAUZE/BANDAGES/DRESSINGS) IMPLANT
PATTIES SURGICAL .5 X3 (DISPOSABLE) IMPLANT
PATTIES SURGICAL 1/4 X 3 (GAUZE/BANDAGES/DRESSINGS) IMPLANT
PATTIES SURGICAL 1X1 (DISPOSABLE) IMPLANT
PIN MAYFIELD SKULL DISP (PIN) ×3 IMPLANT
PLATE DOUBLE Y CMF 6H (Plate) ×2 IMPLANT
SCREW UNIII AXS SD 1.5X4 (Screw) ×13 IMPLANT
SPECIMEN JAR SMALL (MISCELLANEOUS) IMPLANT
SPONGE NEURO XRAY DETECT 1X3 (DISPOSABLE) IMPLANT
SPONGE SURGIFOAM ABS GEL 100 (HEMOSTASIS) ×3 IMPLANT
SPONGE T-LAP 4X18 ~~LOC~~+RFID (SPONGE) ×1 IMPLANT
STAPLER VISISTAT 35W (STAPLE) ×3 IMPLANT
SUT ETHILON 3 0 FSL (SUTURE) IMPLANT
SUT ETHILON 3 0 PS 1 (SUTURE) IMPLANT
SUT MNCRL AB 3-0 PS2 18 (SUTURE) ×1 IMPLANT
SUT MON AB 3-0 SH 27 (SUTURE)
SUT MON AB 3-0 SH27 (SUTURE) IMPLANT
SUT NURALON 4 0 TR CR/8 (SUTURE) ×8 IMPLANT
SUT SILK 0 TIES 10X30 (SUTURE) IMPLANT
SUT VIC AB 2-0 CP2 18 (SUTURE) ×5 IMPLANT
TOWEL GREEN STERILE (TOWEL DISPOSABLE) ×3 IMPLANT
TOWEL GREEN STERILE FF (TOWEL DISPOSABLE) ×3 IMPLANT
TRAY FOLEY MTR SLVR 16FR STAT (SET/KITS/TRAYS/PACK) ×3 IMPLANT
TUBE CONNECTING 12X1/4 (SUCTIONS) ×3 IMPLANT
UNDERPAD 30X36 HEAVY ABSORB (UNDERPADS AND DIAPERS) ×3 IMPLANT
WATER STERILE IRR 1000ML POUR (IV SOLUTION) ×3 IMPLANT

## 2021-05-18 NOTE — Anesthesia Procedure Notes (Signed)
Arterial Line Insertion Start/End8/22/2022 8:03 AM, 05/18/2021 8:06 AM Performed by: Murvin Natal, MD, Bryson Corona, CRNA, CRNA  Preanesthetic checklist: patient identified, IV checked, site marked, risks and benefits discussed, surgical consent, monitors and equipment checked, pre-op evaluation, timeout performed and anesthesia consent Patient sedated Left, radial was placed Catheter size: 20 G Hand hygiene performed , maximum sterile barriers used  and Seldinger technique used Allen's test indicative of satisfactory collateral circulation Attempts: 1 Procedure performed without using ultrasound guided technique. Following insertion, dressing applied and Biopatch. Post procedure assessment: normal  Patient tolerated the procedure well with no immediate complications.

## 2021-05-18 NOTE — Op Note (Signed)
PATIENT: Olivia Shah  DAY OF SURGERY: 05/18/21   PRE-OPERATIVE DIAGNOSIS:  Brain tumor   POST-OPERATIVE DIAGNOSIS:  Same   PROCEDURE:  Right pterional craniotomy for tumor resection, use of frameless stereotaxy and operating microscope.   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 This was discussed with the patient as well as risks, benefits, and alternatives and wished to proceed with surgery.   OPERATIVE DETAIL: The patient was taken to the operating room and placed on the OR table in the supine position. A formal time out was performed with two patient identifiers and confirmed the operative site. Anesthesia was induced by the anesthesia team. The [place] head holder was applied to the head and a registration array was attached to the [place]. This was co-registered with the patient's preoperative imaging, the fit appeared to be acceptable. Using frameless stereotaxy, the operative trajectory was planned and the incision was marked. Hair was clipped with surgical clippers over the incision and the area was then prepped and draped in a sterile fashion.  An incision was placed on the right scalp for a standard pterional approach. A pterional craniotomy was created in the usual fashion, the pterion was drilled flat, the dura was opened and flapped anteriorly. The Sylvian fissure was split and the ipsilateral ICA was followed to the skull base with identification of the neurovascular anatomy. The tumor was incised and sent for frozen. I mobilized it off the skull base with bipolars to devascularize the tumor, then internally debulked it to allow for resection. I was able to dissect it off the bilateral ICAs, optic nerves, and chiasm. The  chiasm and contralateral nerve were severely compressed, sharp dissection was used whenever possible to avoid any extra manipulation.   With resection complete, the specimen was handed off and sent to pathology, hemostasis was obtained and confirmed, the dura was closed with suture, and the bone flap was replaced with titanium plates and screws.  All instrument and sponge counts were correct, the incision was then closed in layers. The patient was then returned to anesthesia for emergence. No apparent complications at the completion of the procedure.   EBL:  365m   DRAINS: none   SPECIMENS: Skull base meningioma   TJudith Part MD 05/18/21 7:30 AM

## 2021-05-18 NOTE — Progress Notes (Signed)
Attempted to reach Dr. Zada Finders regarding diet orders, maintenance fluids and the patient's urine output. Will reach out again. Thayer Ohm D

## 2021-05-18 NOTE — Anesthesia Preprocedure Evaluation (Signed)
Anesthesia Evaluation  Patient identified by MRN, date of birth, ID band Patient awake    Reviewed: Allergy & Precautions, NPO status , Patient's Chart, lab work & pertinent test results, reviewed documented beta blocker date and time   Airway Mallampati: II  TM Distance: >3 FB Neck ROM: Full    Dental  (+) Missing,    Pulmonary neg pulmonary ROS,    Pulmonary exam normal breath sounds clear to auscultation       Cardiovascular hypertension, Pt. on medications and Pt. on home beta blockers Normal cardiovascular exam Rhythm:Regular Rate:Normal  ECG: NSR, rate 62   Neuro/Psych negative psych ROS   GI/Hepatic negative GI ROS, Neg liver ROS,   Endo/Other  diabetes, Oral Hypoglycemic Agents  Renal/GU negative Renal ROS     Musculoskeletal  (+) Arthritis ,   Abdominal (+) + obese,   Peds  Hematology negative hematology ROS (+)   Anesthesia Other Findings Meningioma, Cerebral  Reproductive/Obstetrics                             Anesthesia Physical Anesthesia Plan  ASA: 3  Anesthesia Plan: General   Post-op Pain Management:    Induction: Intravenous  PONV Risk Score and Plan: 3 and Ondansetron, Dexamethasone and Treatment may vary due to age or medical condition  Airway Management Planned: Oral ETT  Additional Equipment: Arterial line  Intra-op Plan:   Post-operative Plan: Extubation in OR  Informed Consent: I have reviewed the patients History and Physical, chart, labs and discussed the procedure including the risks, benefits and alternatives for the proposed anesthesia with the patient or authorized representative who has indicated his/her understanding and acceptance.     Dental advisory given  Plan Discussed with: CRNA  Anesthesia Plan Comments: (PIV x 2 Potential central line placement discussed)       Anesthesia Quick Evaluation

## 2021-05-18 NOTE — Transfer of Care (Addendum)
Immediate Anesthesia Transfer of Care Note  Patient: Olivia Shah  Procedure(s) Performed: Right craniotomy for tumor resection (Right) APPLICATION OF CRANIAL NAVIGATION  Patient Location: PACU  Anesthesia Type:General  Level of Consciousness: drowsy and patient cooperative  Airway & Oxygen Therapy: Patient Spontanous Breathing and Patient connected to face mask oxygen  Post-op Assessment: Report given to RN, Post -op Vital signs reviewed and stable, Patient moving all extremities X 4 and Patient able to stick tongue midline  Post vital signs: Reviewed and stable  Last Vitals:  Vitals Value Taken Time  BP 110/67 05/18/21 1224  Temp    Pulse 67 05/18/21 1227  Resp 15 05/18/21 1227  SpO2 99 % 05/18/21 1227  Vitals shown include unvalidated device data.  Last Pain:  Vitals:   05/18/21 0603  TempSrc: Oral  PainSc:          Complications: No notable events documented.

## 2021-05-18 NOTE — Anesthesia Procedure Notes (Addendum)
Procedure Name: Intubation Date/Time: 05/18/2021 8:02 AM Performed by: Bryson Corona, CRNA Pre-anesthesia Checklist: Patient identified, Emergency Drugs available, Suction available and Patient being monitored Patient Re-evaluated:Patient Re-evaluated prior to induction Oxygen Delivery Method: Circle System Utilized Preoxygenation: Pre-oxygenation with 100% oxygen Induction Type: IV induction Ventilation: Mask ventilation without difficulty Laryngoscope Size: Mac and 3 Grade View: Grade I Tube type: Oral Tube size: 7.0 mm Number of attempts: 1 Airway Equipment and Method: Stylet and Oral airway Placement Confirmation: ETT inserted through vocal cords under direct vision, positive ETCO2 and breath sounds checked- equal and bilateral Secured at: 22 cm Tube secured with: Tape Dental Injury: Teeth and Oropharynx as per pre-operative assessment  Comments: Placed by Orland Mustard SRNA

## 2021-05-18 NOTE — H&P (Signed)
Surgical H&P Update  HPI: 65 y.o. woman with a history of congenital right sided blindness who presented with progressive loss of left sided vision. 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 I therefore recommended surgical resection. No changes in health since she was last seen.   PMHx:  Past Medical History:  Diagnosis Date   Arthritis    Diabetes mellitus without complication ([place])    Hypertension    FamHx:  Family History  Problem Relation Age of Onset   Breast cancer Neg Hx    SocHx:  reports that she has never smoked. She has never used smokeless tobacco. No history on file for alcohol use and drug use.  Physical Exam: AOx3, congenitally blind / dysconjugate OD, vision OS with temporal field cut, FS, TM  Strength 5/5 x4, SILTx4  Assesment/Plan: 65 y.o. 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 Risks, benefits, and alternatives discussed and the patient would like to continue with surgery.  -OR today -4N ICU post-op  Judith Part, MD 05/18/21 7:27 AM

## 2021-05-18 NOTE — Brief Op Note (Signed)
05/18/2021  12:19 PM  PATIENT:  Cindie Laroche  65 y.o. female  PRE-OPERATIVE DIAGNOSIS:  Meningioma, Cerebral  POST-OPERATIVE DIAGNOSIS:  Meningioma, Cerebral  PROCEDURE:  Procedure(s): Right craniotomy for tumor resection (Right) APPLICATION OF CRANIAL NAVIGATION (N/A)  SURGEON:  Surgeon(s) and Role:    * Judith Part, MD - Primary  PHYSICIAN ASSISTANT:   ASSISTANTS: none   ANESTHESIA:   general  EBL:  350 mL   BLOOD ADMINISTERED:none  DRAINS: none   LOCAL MEDICATIONS USED:  LIDOCAINE   SPECIMEN:  Source of Specimen:  Skull base meningioma  DISPOSITION OF SPECIMEN:  PATHOLOGY  COUNTS:  YES  TOURNIQUET:  * No tourniquets in log *  DICTATION: .Epic  PLAN OF CARE: Admit to inpatient   PATIENT DISPOSITION:  PACU - hemodynamically stable.   Delay start of Pharmacological VTE agent (>24hrs) due to surgical blood loss or risk of bleeding: yes

## 2021-05-19 ENCOUNTER — Inpatient Hospital Stay (HOSPITAL_COMMUNITY): Payer: Medicare Other

## 2021-05-19 ENCOUNTER — Encounter (HOSPITAL_COMMUNITY): Payer: Self-pay | Admitting: Neurological Surgery

## 2021-05-19 LAB — SURGICAL PATHOLOGY

## 2021-05-19 MED ORDER — LORAZEPAM 2 MG/ML IJ SOLN
INTRAMUSCULAR | Status: AC
  Filled 2021-05-19: qty 1

## 2021-05-19 MED ORDER — LEVETIRACETAM IN NACL 1000 MG/100ML IV SOLN
1000.0000 mg | Freq: Two times a day (BID) | INTRAVENOUS | Status: DC
  Administered 2021-05-19 (×2): 1000 mg via INTRAVENOUS
  Filled 2021-05-19: qty 100

## 2021-05-19 MED ORDER — SODIUM CHLORIDE 0.9 % IV SOLN
INTRAVENOUS | Status: DC | PRN
  Administered 2021-05-19: 250 mL via INTRAVENOUS

## 2021-05-19 MED ORDER — GADOBUTROL 1 MMOL/ML IV SOLN
8.5000 mL | Freq: Once | INTRAVENOUS | Status: AC | PRN
  Administered 2021-05-19: 8.5 mL via INTRAVENOUS

## 2021-05-19 NOTE — Progress Notes (Addendum)
Neurosurgery Service Progress Note  Subjective: Had a 30 second long GTC this morning by report, stopped w/o intervention, already back to baseline, vision is stable to improved from preop  Objective: Vitals:   05/19/21 0300 05/19/21 0400 05/19/21 0500 05/19/21 0600  BP: 96/63 (!) 93/59 (!) 96/54 (!) 78/55  Pulse: 78 71 65 63  Resp: '16 17 18 19  '$ Temp:  97.7 F (36.5 C)    TempSrc:  Oral    SpO2: 95% 96% 96% 95%  Weight:      Height:        Physical Exam: AOx3, congenitally blind OD, visual field OS stable from preop and able to count fingers well, speech fluent with normal content, affect full and reactive, FS, TM, Strength 5/5 x4, SILTx4  Assessment & Plan: 65 y.o. woman s/p resection of planum meningioma, recovering well.  -MRI today -PT/OT -started keppra 1g bid, keep in ICU given new seizure -SCDs/TEDs, [facility] tomorrow  Judith Part  05/19/21 7:06 AM

## 2021-05-19 NOTE — Progress Notes (Signed)
Patient had one small episode of emesis after receiving her 2200 meds. Given zofran to relieve nausea and vomiting. Will continue to monitor.  Hart Rochester, RN

## 2021-05-19 NOTE — Anesthesia Postprocedure Evaluation (Signed)
Anesthesia Post Note  Patient: Olivia Shah  Procedure(s) Performed: Right craniotomy for tumor resection (Right) APPLICATION OF CRANIAL NAVIGATION     Patient location during evaluation: PACU Anesthesia Type: General Level of consciousness: awake Pain management: pain level controlled Vital Signs Assessment: post-procedure vital signs reviewed and stable Respiratory status: spontaneous breathing, nonlabored ventilation, respiratory function stable and patient connected to nasal cannula oxygen Cardiovascular status: blood pressure returned to baseline and stable Postop Assessment: no apparent nausea or vomiting Anesthetic complications: no   No notable events documented.  Last Vitals:  Vitals:   05/19/21 0500 05/19/21 0600  BP: (!) 96/54 (!) 78/55  Pulse: 65 63  Resp: 18 19  Temp:    SpO2: 96% 95%    Last Pain:  Vitals:   05/19/21 0600  TempSrc:   PainSc: Asleep                 Eduardo Honor P Daquan Crapps

## 2021-05-19 NOTE — Progress Notes (Signed)
1030:  Patient had a witness seizure that lasted about 1 minute.  Symptoms include eye and mouth twitching with left gaze.  RN in room while seizure occurred.  Neurosurgeon notified and orders received.    1035:  Patient able to follow commands and appears to be back to baseline.

## 2021-05-19 NOTE — Progress Notes (Signed)
Patient had episode of vomiting after taking oral medications.  Some pills seen in emesis bag.  Pharmacist notified.

## 2021-05-19 NOTE — Progress Notes (Signed)
PT Cancellation Note  Patient Details Name: Olivia Shah MRN: [id number] DOB: 04-08-1956   Cancelled Treatment:    Reason Eval/Treat Not Completed: Patient at procedure or test/unavailable   Wyona Almas, PT, DPT Acute Rehabilitation Services Pager [phone] Office [phone]    Deno Etienne 05/19/2021, 10:33 AM

## 2021-05-19 NOTE — Progress Notes (Signed)
OT Cancellation Note  Patient Details Name: Olivia Shah MRN: [id number] DOB: 1955/10/16   Cancelled Treatment:    Reason Eval/Treat Not Completed: Patient at procedure or test/ unavailable  [facility], OT/L   Acute OT Clinical Specialist Acute Rehabilitation Services Pager [phone] Office [phone]  05/19/2021, 9:55 AM

## 2021-05-19 NOTE — Progress Notes (Signed)
OT Note  Began OT evaluation at bed level and completed intake of information. Left room to find recliner to mobilize pt OOB. When returning to room, pt had just had a seizure - nurses present. Will plan to hold today and attempt tomorrow as medically appropriate.   Maurie Boettcher, OT/L   Acute OT Clinical Specialist Acute Rehabilitation Services Pager [phone] Office [phone]

## 2021-05-20 ENCOUNTER — Inpatient Hospital Stay (HOSPITAL_COMMUNITY): Payer: Medicare Other

## 2021-05-20 DIAGNOSIS — D496 Neoplasm of unspecified behavior of brain: Secondary | ICD-10-CM

## 2021-05-20 DIAGNOSIS — G9340 Encephalopathy, unspecified: Secondary | ICD-10-CM

## 2021-05-20 DIAGNOSIS — G40901 Epilepsy, unspecified, not intractable, with status epilepticus: Secondary | ICD-10-CM

## 2021-05-20 LAB — GLUCOSE, CAPILLARY: Glucose-Capillary: 165 mg/dL — ABNORMAL HIGH (ref 70–99)

## 2021-05-20 LAB — PHOSPHORUS: Phosphorus: 2.1 mg/dL — ABNORMAL LOW (ref 2.5–4.6)

## 2021-05-20 LAB — MAGNESIUM: Magnesium: 1.8 mg/dL (ref 1.7–2.4)

## 2021-05-20 MED ORDER — VITAMIN E 45 MG (100 UNIT) PO CAPS
400.0000 [IU] | ORAL_CAPSULE | Freq: Every day | ORAL | Status: DC
  Administered 2021-05-22: 400 [IU]
  Filled 2021-05-20 (×5): qty 4

## 2021-05-20 MED ORDER — LEVETIRACETAM IN NACL 1500 MG/100ML IV SOLN
1500.0000 mg | Freq: Two times a day (BID) | INTRAVENOUS | Status: DC
  Administered 2021-05-21 – 2021-05-22 (×3): 1500 mg via INTRAVENOUS
  Filled 2021-05-20 (×3): qty 100

## 2021-05-20 MED ORDER — HYDROCHLOROTHIAZIDE 25 MG PO TABS
12.5000 mg | ORAL_TABLET | Freq: Every day | ORAL | Status: DC
  Administered 2021-05-21 – 2021-06-04 (×15): 12.5 mg
  Filled 2021-05-20 (×15): qty 1

## 2021-05-20 MED ORDER — CHLORHEXIDINE GLUCONATE 0.12 % MT SOLN
15.0000 mL | Freq: Two times a day (BID) | OROMUCOSAL | Status: DC
  Administered 2021-05-20 – 2021-05-25 (×4): 15 mL via OROMUCOSAL
  Filled 2021-05-20 (×4): qty 15

## 2021-05-20 MED ORDER — AMLODIPINE BESYLATE 10 MG PO TABS
10.0000 mg | ORAL_TABLET | Freq: Every day | ORAL | Status: DC
  Administered 2021-05-21 – 2021-06-04 (×15): 10 mg
  Filled 2021-05-20 (×15): qty 1

## 2021-05-20 MED ORDER — LEVETIRACETAM IN NACL 1000 MG/100ML IV SOLN
1000.0000 mg | Freq: Two times a day (BID) | INTRAVENOUS | Status: DC
  Administered 2021-05-20: 1000 mg via INTRAVENOUS
  Filled 2021-05-20: qty 100

## 2021-05-20 MED ORDER — JEVITY 1.2 CAL PO LIQD
1000.0000 mL | ORAL | Status: DC
  Administered 2021-05-20 – 2021-06-04 (×15): 1000 mL
  Filled 2021-05-20 (×26): qty 1000

## 2021-05-20 MED ORDER — PROMETHAZINE HCL 25 MG PO TABS: 12.5000 mg | ORAL_TABLET | ORAL | Status: DC | PRN

## 2021-05-20 MED ORDER — ORAL CARE MOUTH RINSE
15.0000 mL | Freq: Two times a day (BID) | OROMUCOSAL | Status: DC
  Administered 2021-05-21: 15 mL via OROMUCOSAL

## 2021-05-20 MED ORDER — HYDROCODONE-ACETAMINOPHEN 5-325 MG PO TABS
1.0000 | ORAL_TABLET | ORAL | Status: DC | PRN
  Administered 2021-05-20: 1

## 2021-05-20 MED ORDER — IRBESARTAN 150 MG PO TABS
150.0000 mg | ORAL_TABLET | Freq: Every day | ORAL | Status: DC
  Administered 2021-05-21 – 2021-06-04 (×15): 150 mg
  Filled 2021-05-20 (×15): qty 1

## 2021-05-20 MED ORDER — PROSOURCE TF PO LIQD: 45.0000 mL | Freq: Two times a day (BID) | ORAL | Status: DC

## 2021-05-20 MED ORDER — ACETAMINOPHEN 325 MG PO TABS
650.0000 mg | ORAL_TABLET | ORAL | Status: DC | PRN
  Administered 2021-05-22 – 2021-05-28 (×3): 650 mg
  Filled 2021-05-20 (×3): qty 2

## 2021-05-20 MED ORDER — INSULIN ASPART 100 UNIT/ML IJ SOLN
0.0000 [IU] | INTRAMUSCULAR | Status: DC
Start: 2021-05-20 — End: 2021-06-04
  Administered 2021-05-20: 3 [IU] via SUBCUTANEOUS
  Administered 2021-05-21: 5 [IU] via SUBCUTANEOUS
  Administered 2021-05-21: 3 [IU] via SUBCUTANEOUS
  Administered 2021-05-21 – 2021-05-22 (×8): 5 [IU] via SUBCUTANEOUS
  Administered 2021-05-23: 3 [IU] via SUBCUTANEOUS
  Administered 2021-05-23: 5 [IU] via SUBCUTANEOUS
  Administered 2021-05-23: 8 [IU] via SUBCUTANEOUS
  Administered 2021-05-23: 3 [IU] via SUBCUTANEOUS
  Administered 2021-05-23: 5 [IU] via SUBCUTANEOUS
  Administered 2021-05-23: 3 [IU] via SUBCUTANEOUS
  Administered 2021-05-24 (×2): 5 [IU] via SUBCUTANEOUS
  Administered 2021-05-24: 2 [IU] via SUBCUTANEOUS
  Administered 2021-05-24 (×2): 5 [IU] via SUBCUTANEOUS
  Administered 2021-05-24: 3 [IU] via SUBCUTANEOUS
  Administered 2021-05-25 (×2): 5 [IU] via SUBCUTANEOUS
  Administered 2021-05-25: 3 [IU] via SUBCUTANEOUS
  Administered 2021-05-25: 8 [IU] via SUBCUTANEOUS
  Administered 2021-05-25 (×2): 5 [IU] via SUBCUTANEOUS
  Administered 2021-05-26: 2 [IU] via SUBCUTANEOUS
  Administered 2021-05-26: 5 [IU] via SUBCUTANEOUS
  Administered 2021-05-26: 3 [IU] via SUBCUTANEOUS
  Administered 2021-05-26: 5 [IU] via SUBCUTANEOUS
  Administered 2021-05-26: 2 [IU] via SUBCUTANEOUS
  Administered 2021-05-26: 5 [IU] via SUBCUTANEOUS
  Administered 2021-05-26: 8 [IU] via SUBCUTANEOUS
  Administered 2021-05-27 (×2): 3 [IU] via SUBCUTANEOUS
  Administered 2021-05-27: 5 [IU] via SUBCUTANEOUS
  Administered 2021-05-27 – 2021-05-28 (×7): 3 [IU] via SUBCUTANEOUS
  Administered 2021-05-28: 8 [IU] via SUBCUTANEOUS
  Administered 2021-05-29 (×2): 3 [IU] via SUBCUTANEOUS
  Administered 2021-05-30: 2 [IU] via SUBCUTANEOUS
  Administered 2021-05-30: 5 [IU] via SUBCUTANEOUS
  Administered 2021-05-30 (×2): 3 [IU] via SUBCUTANEOUS
  Administered 2021-05-30 – 2021-05-31 (×4): 2 [IU] via SUBCUTANEOUS
  Administered 2021-05-31: 3 [IU] via SUBCUTANEOUS
  Administered 2021-05-31: 2 [IU] via SUBCUTANEOUS
  Administered 2021-06-01: 3 [IU] via SUBCUTANEOUS
  Administered 2021-06-01 (×2): 2 [IU] via SUBCUTANEOUS
  Administered 2021-06-01: 3 [IU] via SUBCUTANEOUS
  Administered 2021-06-02: 2 [IU] via SUBCUTANEOUS
  Administered 2021-06-02: 3 [IU] via SUBCUTANEOUS
  Administered 2021-06-02: 2 [IU] via SUBCUTANEOUS
  Administered 2021-06-03 (×2): 3 [IU] via SUBCUTANEOUS
  Administered 2021-06-03: 2 [IU] via SUBCUTANEOUS
  Administered 2021-06-03 – 2021-06-04 (×6): 3 [IU] via SUBCUTANEOUS
  Administered 2021-06-04: 2 [IU] via SUBCUTANEOUS
  Administered 2021-06-04: 3 [IU] via SUBCUTANEOUS

## 2021-05-20 MED ORDER — DOCUSATE SODIUM 50 MG/5ML PO LIQD
100.0000 mg | Freq: Two times a day (BID) | ORAL | Status: DC
  Administered 2021-05-20 – 2021-06-03 (×21): 100 mg
  Filled 2021-05-20 (×23): qty 10

## 2021-05-20 MED ORDER — METOPROLOL TARTRATE 50 MG PO TABS
200.0000 mg | ORAL_TABLET | Freq: Every day | ORAL | Status: DC
  Administered 2021-05-20 – 2021-05-26 (×7): 200 mg
  Filled 2021-05-20 (×7): qty 4

## 2021-05-20 MED ORDER — LORAZEPAM 2 MG/ML IJ SOLN
INTRAMUSCULAR | Status: AC
  Filled 2021-05-20: qty 1

## 2021-05-20 MED ORDER — METFORMIN HCL 500 MG PO TABS: 1000.0000 mg | ORAL_TABLET | Freq: Every day | ORAL | Status: DC

## 2021-05-20 MED ORDER — PROSOURCE TF PO LIQD
45.0000 mL | Freq: Three times a day (TID) | ORAL | Status: DC
  Administered 2021-05-20 – 2021-06-04 (×44): 45 mL
  Filled 2021-05-20 (×45): qty 45

## 2021-05-20 MED ORDER — LEVETIRACETAM 500 MG PO TABS
1000.0000 mg | ORAL_TABLET | Freq: Two times a day (BID) | ORAL | Status: DC
  Filled 2021-05-20: qty 2

## 2021-05-20 MED ORDER — ACETAMINOPHEN 650 MG RE SUPP: 650.0000 mg | RECTAL | Status: DC | PRN

## 2021-05-20 MED ORDER — POLYETHYLENE GLYCOL 3350 17 G PO PACK: 17.0000 g | PACK | Freq: Every day | ORAL | Status: DC | PRN

## 2021-05-20 MED ORDER — VITAL HIGH PROTEIN PO LIQD: 1000.0000 mL | ORAL | Status: DC

## 2021-05-20 MED ORDER — METFORMIN HCL 500 MG PO TABS
500.0000 mg | ORAL_TABLET | Freq: Every day | ORAL | Status: DC
  Administered 2021-05-20: 500 mg

## 2021-05-20 MED ORDER — ADULT MULTIVITAMIN W/MINERALS CH
1.0000 | ORAL_TABLET | Freq: Every day | ORAL | Status: DC
  Administered 2021-05-21 – 2021-06-04 (×15): 1
  Filled 2021-05-20 (×16): qty 1

## 2021-05-20 NOTE — Progress Notes (Signed)
Initial Nutrition Assessment  DOCUMENTATION CODES:   Obesity unspecified  INTERVENTION:   Initiate tube feeding via Cortrak tube: Jevity 1.2 at 55 ml/h (1320 ml per day) Prosource TF 45 ml TID  Provides 1704 kcal, 106 gm protein, 1070 ml free water daily   NUTRITION DIAGNOSIS:   Inadequate oral intake related to inability to eat as evidenced by NPO status.  GOAL:   Patient will meet greater than or equal to 90% of their needs  MONITOR:   TF tolerance  REASON FOR ASSESSMENT:   Consult, Ventilator Enteral/tube feeding initiation and management  ASSESSMENT:   Pt with PMH of DM, HTN, arthritis, congenital R sided blindness who had progressive visual loss in her L eye and found to have a planum meningioma admitted for a craniotomy with resection.    Pt discussed during ICU rounds and with RN.  Pt lethargic today, unable to take medications. SLP consult pending.  O2: 2L nasal cannula  Per LTM EEG pt with ongoing seizures.    8/24 s/p cortrak placement; tip gastric   Medications reviewed and include: colace, glocophage, MVI with minerals, vitamin E  Keppra  Labs reviewed: A1C: 6.6 CBG's: 151   Diet Order:   Diet Order     None       EDUCATION NEEDS:   Not appropriate for education at this time  Skin:  Skin Assessment: Reviewed RN Assessment  Last BM:  unknown  Height:   Ht Readings from Last 1 Encounters:  05/18/21 '5\' 2"'$  (1.575 m)    Weight:   Wt Readings from Last 1 Encounters:  05/18/21 85.5 kg    BMI:  Body mass index is 34.48 kg/m.  Estimated Nutritional Needs:   Kcal:  1700-1900  Protein:  100-110 grams  Fluid:  >1.7 L/day  Lockie Pares., RD, LDN, CNSC See AMiON for contact information

## 2021-05-20 NOTE — Progress Notes (Signed)
Inpatient Rehab Admissions Coordinator:   Per therapy recommendations,  patient was screened for CIR candidacy by Clemens Catholic, MS, CCC-SLP. At this time, Pt. Appears to demonstrate medical necessity, functional decline, and ability to tolerate intensity of CIR. Pt. is a potential candidate for CIR. I will place   order for rehab consult per protocol for full assessment. Please contact me any with questions.  Clemens Catholic, [place], [place] Admissions Coordinator  [phone] ([place]) [phone] (office)

## 2021-05-20 NOTE — Progress Notes (Signed)
EEG complete - results pending 

## 2021-05-20 NOTE — Procedures (Signed)
Cortrak  Person Inserting Tube:  Reanne Nellums, RD Tube Type:  Cortrak - 43 inches Tube Size:  10 Tube Location:  Right nare Initial Placement:  Stomach Secured by: Bridle Technique Used to Measure Tube Placement:  Marking at nare/corner of mouth Cortrak Secured At:  60 cm Procedure Comments:  Cortrak Tube Team Note:  Consult received to place a Cortrak feeding tube.   X-ray is required, abdominal x-ray has been ordered by the Cortrak team. Please confirm tube placement before using the Cortrak tube.   If the tube becomes dislodged please keep the tube and contact the Cortrak team at [email or web address] (password [email or web address]) for replacement.  If after hours and replacement cannot be delayed, place a NG tube and confirm placement with an abdominal x-ray.    Mariana Single MS, RD, LDN, CNSC Clinical Nutrition Pager listed in [place]

## 2021-05-20 NOTE — Procedures (Signed)
Patient Name: Olivia Shah  MRN: [id number]  Epilepsy Attending: Lora Havens  Referring Physician/Provider: Dr Emelda Brothers Date: 05/20/2021 Duration: 27.03 mins  Patient history: 65 y.o. woman s/p resection of planum meningioma, recovering well. 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 Post-op course w/ GTC on POD1, started keppra 8/23.  Continues to be encephalopathic.  EEG to evaluate for seizures.  Level of alertness: Awake,asleep  AEDs during EEG study: LEV  Technical aspects: This EEG study was done with scalp electrodes positioned according to the 10-20 International system of electrode placement. Electrical activity was acquired at a sampling rate of '500Hz'$  and reviewed with a high frequency filter of '70Hz'$  and a low frequency filter of '1Hz'$ . EEG data were recorded continuously and digitally stored.   Description: No clear posterior dominant rhythm was seen.  Sleep was characterized by sleep spindles (12 to 14 Hz), maximal frontocentral region.  EEG showed continuous generalized and maximal right frontotemporal 5 to 6 Hz theta as well as intermittent generalized 2 to 3 Hz delta slowing.  Hyperventilation and photic stimulation were not performed.     ABNORMALITY -Continuous slow, generalized and maximal right frontotemporal region  IMPRESSION: This study is suggestive of cortical dysfunction in right frontotemporal region likely secondary to underlying structural abnormality.  There is also moderate diffuse encephalopathy, nonspecific etiology.  No seizures or definite epileptiform discharges were seen throughout the recording.  [name]

## 2021-05-20 NOTE — Progress Notes (Signed)
Neurosurgery Service Progress Note  Subjective: No further seizures overnight / yesterday, but more somnolent, did start keppra yseterday  Objective: Vitals:   05/20/21 0300 05/20/21 0400 05/20/21 0500 05/20/21 0600  BP: 119/64 107/87 130/72 120/66  Pulse: 94 70 84 76  Resp: (!) 21 (!) 21 (!) 21 19  Temp:  98.9 F (37.2 C)    TempSrc:  Axillary    SpO2: 96% 96% 97% 98%  Weight:      Height:        Physical Exam: Awake but definitely globally slower & less bright than yesterday, Ox1, R eye swollen shut, L nasal field intact, speech fluent but with some dysarthria, mild left sided hemineglect but Fcx4 without preference with strength 5/5x4  Assessment & Plan: 65 y.o. woman s/p resection of planum meningioma, recovering well. 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 Post-op course w/ GTC on POD1, started keppra 8/23  -continue keppra, will see if encephalopathy is 2/2 subclinical seizures vs keppra, will keep in ICU and start continuous EEG -SCDs/TEDs, [facility]  Judith Part  05/20/21 8:38 AM

## 2021-05-20 NOTE — Evaluation (Signed)
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 Pt currently very lethargic and noted to have answer "yes" to most questions. Pt verbalized with slurred speech head hurting and location hospital/[place]. Pt powered up from chair mod (A) and pivoted to chair mod (A) at this time.  Patient will benefit from skilled OT acutely to increase independence and safety with ADLS to allow discharge CIR.     Follow Up Recommendations  CIR    Equipment Recommendations  3 in 1 bedside commode    Recommendations for Other Services Rehab consult     Precautions / Restrictions Precautions Precautions: Fall Precaution Comments: seizures Restrictions Weight Bearing Restrictions: No      Mobility Bed Mobility Overal bed mobility: Needs Assistance Bed Mobility: Sit to Supine       Sit to supine: +2 for physical assistance;Mod assist   General bed mobility comments: in recliner on arrival pt requires (A) to control trunk and lift bil LE onto bed surface. pt immediately going back to sleep upon supine    Transfers Overall transfer level: Needs assistance Equipment used: 2 person hand held assist Transfers: Sit to/from Stand Sit to Stand: Mod assist         General transfer comment: modA+2 to rise and steady. Requires assist to initiate movement and repeated cues to perform    Balance Overall balance assessment: Needs  assistance Sitting-balance support: No upper extremity supported;Feet supported Sitting balance-Leahy Scale: Fair     Standing balance support: Bilateral upper extremity supported;During functional activity Standing balance-Leahy Scale: Poor Standing balance comment: reliant on UE support and external assist +2                           ADL either performed or assessed with clinical judgement   ADL Overall ADL's : Needs assistance/impaired   Eating/Feeding Details (indicate cue type and reason): breakfast present without intake. RN aware   Grooming Details (indicate cue type and reason): max (A)                 Toilet Transfer: Moderate assistance;Stand-pivot             General ADL Comments: pt progressed from chair to bed with counting "1--2--3" two person (A) avaiable but power up mod (A) and stand pivot mod (A)     Vision Baseline Vision/History: 1 Wears glasses Ability to See in Adequate Light: 2 Moderately impaired Patient Visual Report: Other (comment) Vision Assessment?: Vision impaired- to be further tested in functional context Additional Comments: R eye completely swollen and able to partially open at this time. noted to have R eye blindness per chart. Pt attending to staff on R side but not L during session. Pt needs cues to visually track L     Perception Perception Perception Tested?: Yes Perception Deficits: Inattention/neglect Inattention/Neglect: Impaired- to be further tested in functional context Comments: noted this session to attend less to L  visual field   Praxis      Pertinent Vitals/Pain Pain Assessment: Faces Faces Pain Scale: Hurts a little bit Pain Location: HA Pain Descriptors / Indicators: Headache Pain Intervention(s): Monitored during session;Repositioned;RN gave pain meds during session     Hand Dominance Right   Extremity/Trunk Assessment Upper Extremity Assessment Upper Extremity Assessment: RUE  deficits/detail;LUE deficits/detail RUE Deficits / Details: WFL LUE Deficits / Details: reports numbness at hand. pt answer yes to most questions so will need to be further assessed. pt holding in a flexion pattern. Pt reports hand during questioning but doesnt give details pt able to show two fingers on command LUE Coordination: decreased fine motor   Lower Extremity Assessment Lower Extremity Assessment: Defer to PT evaluation   Cervical / Trunk Assessment Cervical / Trunk Assessment: Kyphotic   Communication Communication Communication: Expressive difficulties;Receptive difficulties   Cognition Arousal/Alertness: Lethargic Behavior During Therapy: Flat affect Overall Cognitive Status: Impaired/Different from baseline Area of Impairment: Orientation;Attention                 Orientation Level: Disoriented to;Time Current Attention Level: Focused   Following Commands: Follows one step commands inconsistently       General Comments: pt reports hospital, [place]. "my head hurts" points toward incision.   General Comments  HR 61 RA98% pt lethargic with all VSS. RN in room to help with medications. EEG arriving after patient position in bed for continuous    Exercises     Shoulder Instructions      Home Living Family/patient expects to be discharged to:: Private residence Living Arrangements: Alone Available Help at Discharge: Family;Available 24 hours/day Type of Home: House Home Access: Stairs to enter [employer] of Steps: 3 Entrance Stairs-Rails: Right Home Layout: One level     Bathroom Shower/Tub: Corporate investment banker: Standard Bathroom Accessibility: Yes How Accessible: Accessible via walker Home Equipment: Toilet riser          Prior Functioning/Environment Level of Independence: Independent        Comments: drove        OT Problem List: Decreased activity tolerance;Decreased range of motion;Impaired  balance (sitting and/or standing);Impaired vision/perception;Decreased coordination;Decreased cognition;Decreased safety awareness;Decreased knowledge of use of DME or AE;Decreased knowledge of precautions;Cardiopulmonary status limiting activity;Impaired sensation;Obesity;Impaired UE functional use      OT Treatment/Interventions: Self-care/ADL training;Therapeutic exercise;Neuromuscular education;Energy conservation;DME and/or AE instruction;Manual therapy;Modalities;Therapeutic activities;Cognitive remediation/compensation;Visual/perceptual remediation/compensation;Patient/family education;Balance training    OT Goals(Current goals can be found in the care plan section) Acute Rehab OT Goals Patient Stated Goal: head hurts OT Goal Formulation: Patient unable to participate in goal setting Time For Goal Achievement: 06/03/21 Potential to Achieve Goals: Good  OT Frequency: Min 2X/week   Barriers to D/C: Other (comment) (unknown at this time)          Co-evaluation              AM-PAC OT "6 Clicks" Daily Activity     Outcome Measure Help from another person eating meals?: A Lot Help from another person taking care of personal grooming?: A Lot Help from another person toileting, which includes using toliet, bedpan, or urinal?: A Lot Help from another person bathing (including washing, rinsing, drying)?: A Lot Help from another person to put on and taking off regular upper body clothing?: A Lot Help from another person to put on and taking off regular lower body clothing?: Total 6 Click Score: 11   End of Session Equipment Utilized During Treatment: Gait belt  Nurse Communication: Mobility status;Precautions  Activity Tolerance: Patient limited by lethargy Patient left: in bed;with call bell/phone within reach;with bed alarm set;with nursing/sitter in room  OT Visit Diagnosis: Unsteadiness on feet (R26.81);Muscle weakness (generalized) (M62.81)                Time: 0941-1000 OT  Time Calculation (min): 19 min Charges:  OT General Charges $OT Visit: 1 Visit OT Evaluation $OT Eval Moderate Complexity: 1 Mod   Brynn, OTR/L  Acute Rehabilitation Services Pager: [phone] Office: [phone] .   Jeri Modena 05/20/2021, 10:04 AM

## 2021-05-20 NOTE — Progress Notes (Signed)
LTM EEG hooked up and running - no initial skin breakdown - push button tested - neuro notified. [facility] monitoring.  

## 2021-05-20 NOTE — Progress Notes (Signed)
Inpatient Rehabilitation Admissions Coordinator   Inpatient rehab consult received. I met with pt's friend, Malachy Mood , at bedside as patient was asleep. I then contacted pt's Niece, Cheron Schaumann, by phone. She is pt's POA. I discussed that patient has the potential need for a CIR admit pending her progress with therapies over the next several days. We discussed goals and expectations of a CIR admit. Family had planned for patient to go directly home after the surgery with family providing 24/7 assist. Jonelle Sidle is open to further discussion of a possible Cir admit if she is in need. I will follow her progress.  Danne Baxter, RN, MSN Rehab Admissions Coordinator [phone] 05/20/2021 2:19 PM

## 2021-05-20 NOTE — Consult Note (Signed)
NAME:  Olivia Shah, MRN:  [id number], DOB:  1956/05/01, LOS: 2 ADMISSION DATE:  05/18/2021, CONSULTATION DATE:  05/20/21 REFERRING MD:  Zada Finders CHIEF COMPLAINT:  AMS, seizures   History of Present Illness:  Olivia Shah is a 65 y.o. female who has PMH including congenital right-sided blindness, HTN, DM, arthritis.  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  She was taken to the OR for right pterional craniotomy which was successful.  The following morning, she was noted to have a 30 second GTC seizure that resolved spontaneously.  She then returned to baseline; however, she was started on Keppra 1g twice daily as prophylaxis.  Unfortunately, later that morning, she had recurrent seizure lasting about 1 minute again resolving spontaneously.  EEG 8/24 AM demonstrated cortical dysfunction in the right frontotemporal region, moderate diffuse encephalopathy, no seizures or definite epileptiform discharges. EEG 8/24 PM was reviewed and showed 5 seizures arising in the right hemisphere which were subclinical.  Due to recurrent seizures, PCCM asked to see in consultation.  Pertinent  Medical History:  has Brain tumor [facility]) on their problem list.  Significant Hospital Events: Including procedures, antibiotic start and stop dates in addition to other pertinent events   8/22 > OR for right craniotomy. 8/23 > 2 separate seizures that resolved spontaneously. 8/24 > subclinical seizures (5 noted on EEG).  Interim History / Subjective:    Objective:  Blood pressure 137/68, pulse 85, temperature 100.3 F (37.9 C), temperature source Oral, resp. rate 16, height '5\' 2"'$  (1.575 m), weight 85.5 kg, SpO2 96 %.        Intake/Output Summary (Last 24 hours) at 05/20/2021 1725 Last data filed at 05/20/2021 0800 Gross per 24 hour  Intake 218.71 ml  Output --  Net 218.71 ml   Filed Weights   05/18/21 0603 05/18/21 1500  Weight:  86.2 kg 85.5 kg    Examination: General: overweight elderly appearing female in NAD Neuro: Lethargic. Opens eyes to tactile stimuli.  HEENT: [place]/AT, R periorbital edema. L pupil responsive.  Cardiovascular: RRR, no MRG Lungs: Clear, bilateral, breath sounds Abdomen: Soft, non-tender, non-distended.   Musculoskeletal: No acute deformity Skin: Areas of urticaria over her anterior abdomen, which family tells me are not unusual      Labs/imaging personally reviewed:  MRI brain 8/24 > interval right pterional craniotomy and resection of an anterior skull base meningioma.  Acute infarct in the right anterior temporal lobe, inferior frontal lobe, lentiform nucleus and posterior limb of the right internal capsule with mild associated edema.  Moderate R > L antedependent pneumocephalus and small volume right sided extra-axial hemorrhage  Resolved Hospital Problem list:    Assessment & Plan:   Acute encephalopathy secondary to Sz, stroke - Close monitoring of mental/respiratory status - Will need intubation if worsens - place on end tidal CO2 montoring  Anterior skull base meningioma (POA) - s/p right pterional craniotomy (Dr. Zada Finders). - Post op care per neurosurgery. - PT / OT.  Status epilepticus - 2/2 above with recent surgery and also possibly in part due to new acute infarcts (see below). - Continue Keppra at increased dosing. - Continue EEG. - Recommend neurology consult for AED management.  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 - PT / OT. - Recommend neurology consult  Urticaria: upper abdomen: likely related to tape during Cortrak placement - Monitor.   Hx  HTN. - Home Amlodipine, Avalide, Metoprolol per tube  Hx DM. - SSI.  - Hold home Metformin.  Best practice (evaluated daily):  Diet/type: NPO DVT prophylaxis: prophylactic heparin  GI prophylaxis: N/A Lines:  N/A Foley:  N/A Code Status:  full code Last date of multidisciplinary goals of care discussion:   Labs   CBC: Recent Labs  Lab 05/18/21 1453  WBC 11.2*  HGB 13.4  HCT 41.9  MCV 88.4  PLT [id number]    Basic Metabolic Panel: Recent Labs  Lab 05/18/21 1453  CREATININE 0.73   GFR: Estimated Creatinine Clearance: 69.3 mL/min (by C-G formula based on SCr of 0.73 mg/dL). Recent Labs  Lab 05/18/21 1453  WBC 11.2*    Liver Function Tests: No results for input(s): AST, ALT, ALKPHOS, BILITOT, PROT, ALBUMIN in the last 168 hours. No results for input(s): LIPASE, AMYLASE in the last 168 hours. No results for input(s): AMMONIA in the last 168 hours.  ABG No results found for: PHART, PCO2ART, PO2ART, HCO3, TCO2, ACIDBASEDEF, O2SAT   Coagulation Profile: No results for input(s): INR, PROTIME in the last 168 hours.  Cardiac Enzymes: No results for input(s): CKTOTAL, CKMB, CKMBINDEX, TROPONINI in the last 168 hours.  HbA1C: Hgb A1c MFr Bld  Date/Time Value Ref Range Status  05/11/2021 08:31 AM 6.6 (H) 4.8 - 5.6 % Final    Comment:    (NOTE) Pre diabetes:          5.7%-6.4%  Diabetes:              >6.4%  Glycemic control for   <7.0% adults with diabetes     CBG: Recent Labs  Lab 05/18/21 1225  GLUCAP 151*    Review of Systems:   Patient is encephalopathic and/or intubated. Therefore history has been obtained from chart review.   Past Medical History:  She,  has a past medical history of Arthritis, Diabetes mellitus without complication ([place]), and Hypertension.   Surgical History:   Past Surgical History:  Procedure Laterality Date   APPLICATION OF CRANIAL NAVIGATION N/A 05/18/2021   Procedure: APPLICATION OF CRANIAL NAVIGATION;  Surgeon: Judith Part, MD;  Location: [place];  Service: Neurosurgery;  Laterality: N/A;   BREAST BIOPSY Right    CHOLECYSTECTOMY     CRANIOTOMY Right 05/18/2021   Procedure: Right craniotomy for tumor resection;  Surgeon:  Judith Part, MD;  Location: [facility] [place];  Service: Neurosurgery;  Laterality: Right;   EYE SURGERY     as a child   HERNIA REPAIR       Social History:   reports that she has never smoked. She has never used smokeless tobacco.   Family History:  Her family history is negative for Breast cancer.   Allergies Allergies  Allergen Reactions   Lisinopril Cough     Home Medications  Prior to Admission medications   Medication Sig Start Date End Date Taking? Authorizing Provider  amLODipine (NORVASC) 10 MG tablet Take 10 mg by mouth daily.   Yes [provider]  Ascorbic Acid (VITAMIN C) 1000 MG tablet Take 1,000 mg by mouth daily.   Yes [provider]  aspirin EC 81 MG tablet Take 81 mg by mouth daily.   Yes [provider]  Calcium Carb-Cholecalciferol (CALCIUM 600 + D PO) Take 1 tablet by mouth 2 (two) times daily.   Yes [provider]  dorzolamide-timolol (COSOPT) 22.3-6.8 MG/ML ophthalmic solution Place 1 drop into the left eye 2 (two) times daily. 03/06/21  Yes [provider]  Garlic 123XX123 MG CAPS Take 1,000 mg by mouth daily.   Yes [provider]  irbesartan-hydrochlorothiazide (AVALIDE) 150-12.5 MG tablet Take 1 tablet by mouth daily. 03/06/21  Yes [provider]  latanoprost (XALATAN) 0.005 % ophthalmic solution Place 1 drop into the left eye at bedtime. 04/24/21  Yes [provider]  metFORMIN (GLUCOPHAGE) 500 MG tablet Take 500-1,000 mg by mouth See admin instructions. Take 1000 mg by mouth in the morning and 500 mg in the evening   Yes [provider]  metoprolol (LOPRESSOR) 100 MG tablet Take 200 mg by mouth at bedtime.   Yes [provider]  Multiple Vitamin (MULTIVITAMIN WITH MINERALS) TABS tablet Take 1 tablet by mouth daily.   Yes [provider]  vitamin E 180 MG (400 UNITS) capsule Take 400 Units by mouth daily.   Yes [provider]     Critical care time:      Georgann Housekeeper, AGACNP-BC [place]  See Amion for personal pager PCCM on call pager [phone] until 7pm. Please call Elink 7p-7a. [phone]  05/20/2021 5:34 PM

## 2021-05-20 NOTE — Evaluation (Signed)
Physical Therapy Evaluation Patient Details Name: Olivia Shah MRN: [id number] DOB: 10/13/1955 Today's Date: 05/20/2021   History of Present Illness  17 female s/p 8/23 resection of planum meningioma R craniotomy and noted to have seizure after procedure MRI (+) acute infarcts R anterior temporal lobe inferior frontal lobe, lentiform nucleus and posterior limb of the R internal capsule  PMH congenitally blind OD, arthritis, DM HTN  Clinical Impression  PTA, patient lives alone and per chart, she was independent prior to surgery. Patient presents with generalized weakness, impaired balance, impaired communication and dysarthria, impaired cognition, decreased activity tolerance, and slow processing. Patient required modA+2 for sit to stand and short ambulation distance with HHAx2. Patient following ~50% of 1 step commands with repetition and increased time to process. Patient will benefit from skilled PT services during acute stay to address listed deficits. Recommend CIR at discharge for intensive therapies to maximize functional independence.     Follow Up Recommendations CIR    Equipment Recommendations  Other (comment) (TBD pending progress)    Recommendations for Other Services Rehab consult     Precautions / Restrictions Precautions Precautions: Fall Precaution Comments: seizures Restrictions Weight Bearing Restrictions: No      Mobility  Bed Mobility               General bed mobility comments: in recliner on arrival    Transfers Overall transfer level: Needs assistance Equipment used: 2 person hand held assist Transfers: Sit to/from Stand Sit to Stand: Mod assist;+2 physical assistance;+2 safety/equipment         General transfer comment: modA+2 to rise and steady. Requires assist to initiate movement and repeated cues to perform  Ambulation/Gait Ambulation/Gait assistance: Mod assist;+2 physical assistance;+2 safety/equipment Gait Distance (Feet): 2  Feet Assistive device: 2 person hand held assist Gait Pattern/deviations: Step-to pattern;Decreased stride length;Decreased weight shift to right;Decreased weight shift to left;Wide base of support;Trunk flexed Gait velocity: decreased   General Gait Details: Short steps with decreased stance time bilaterally requiring modA+2 for balance and support. Close chair follow for safety. Patient with uncontrolled descent without warning during mobility.  Stairs            Wheelchair Mobility    Modified Rankin (Stroke Patients Only) Modified Rankin (Stroke Patients Only) Pre-Morbid Rankin Score: No symptoms Modified Rankin: Moderately severe disability     Balance Overall balance assessment: Needs assistance Sitting-balance support: No upper extremity supported;Feet supported Sitting balance-Leahy Scale: Fair     Standing balance support: Bilateral upper extremity supported;During functional activity Standing balance-Leahy Scale: Poor Standing balance comment: reliant on UE support and external assist +2                             Pertinent Vitals/Pain Pain Assessment: Faces Faces Pain Scale: Hurts even more Pain Location: headache Pain Descriptors / Indicators: Headache Pain Intervention(s): Monitored during session;Repositioned    Home Living Family/patient expects to be discharged to:: Private residence Living Arrangements: Alone Available Help at Discharge: Family;Available 24 hours/day (initially) Type of Home: House Home Access: Stairs to enter Entrance Stairs-Rails: Right Entrance Stairs-Number of Steps: 3 Home Layout: One level Home Equipment: Toilet riser      Prior Function Level of Independence: Independent         Comments: drove     Hand Dominance        Extremity/Trunk Assessment   Upper Extremity Assessment Upper Extremity Assessment: Defer to OT evaluation  Lower Extremity Assessment Lower Extremity Assessment: Difficult  to assess due to impaired cognition (Difficult to assess due to patient not following commands consistently. Patient demos generalized weakness functionally with mobility.)    Cervical / Trunk Assessment Cervical / Trunk Assessment: Kyphotic  Communication   Communication: Expressive difficulties;Receptive difficulties  Cognition Arousal/Alertness: Lethargic Behavior During Therapy: Flat affect Overall Cognitive Status: Impaired/Different from baseline Area of Impairment: Orientation;Attention;Following commands                 Orientation Level: Disoriented to;Time Current Attention Level: Sustained   Following Commands: Follows one step commands inconsistently;Follows one step commands with increased time       General Comments: Difficult to fully assess due to lethargy. Patient following ~50% of commands with increased time and repetition. Disoriented to time with patient shaking head "no" to both "Is it October?" and "Is it August?". Patient with decreased attention requiring frequent cueing and tactile stimulation to attend to task      General Comments General comments (skin integrity, edema, etc.): At end of session, BP 130/87 and spO2 92-94% on RA    Exercises     Assessment/Plan    PT Assessment Patient needs continued PT services  PT Problem List Decreased strength;Decreased activity tolerance;Decreased balance;Decreased mobility;Decreased coordination;Decreased cognition;Decreased safety awareness       PT Treatment Interventions DME instruction;Gait training;Functional mobility training;Stair training;Therapeutic activities;Therapeutic exercise;Balance training;Neuromuscular re-education;Patient/family education    PT Goals (Current goals can be found in the Care Plan section)  Acute Rehab PT Goals Patient Stated Goal: did not state PT Goal Formulation: Patient unable to participate in goal setting Time For Goal Achievement: 06/03/21 Potential to Achieve  Goals: Fair    Frequency Min 4X/week   Barriers to discharge        Co-evaluation               AM-PAC PT "6 Clicks" Mobility  Outcome Measure Help needed turning from your back to your side while in a flat bed without using bedrails?: A Lot Help needed moving from lying on your back to sitting on the side of a flat bed without using bedrails?: A Lot Help needed moving to and from a bed to a chair (including a wheelchair)?: Total Help needed standing up from a chair using your arms (e.g., wheelchair or bedside chair)?: Total Help needed to walk in hospital room?: Total Help needed climbing 3-5 steps with a railing? : Total 6 Click Score: 8    End of Session Equipment Utilized During Treatment: Gait belt Activity Tolerance: Patient limited by lethargy Patient left: in chair;with call bell/phone within reach;with chair alarm set Nurse Communication: Mobility status PT Visit Diagnosis: Unsteadiness on feet (R26.81);Muscle weakness (generalized) (M62.81);Difficulty in walking, not elsewhere classified (R26.2);Other abnormalities of gait and mobility (R26.89);Other symptoms and signs involving the nervous system [id number])    Time: [id number] PT Time Calculation (min) (ACUTE ONLY): 22 min   Charges:   PT Evaluation $PT Eval Moderate Complexity: 1 Mod          [place] Athanas A. Gilford Rile PT, DPT Acute Rehabilitation Services Pager [phone] Office [phone]   Linna Hoff 05/20/2021, 9:37 AM

## 2021-05-20 NOTE — Progress Notes (Deleted)
EEG complete - results pending 

## 2021-05-20 NOTE — Evaluation (Signed)
Clinical/Bedside Swallow Evaluation Patient Details  Name: Olivia Shah MRN: [id number] Date of Birth: 1956-01-28  Today's Date: 05/20/2021 Time: SLP Start Time (ACUTE ONLY): 35 SLP Stop Time (ACUTE ONLY): 1230 SLP Time Calculation (min) (ACUTE ONLY): 15 min  Past Medical History:  Past Medical History:  Diagnosis Date   Arthritis    Diabetes mellitus without complication ([place])    Hypertension    Past Surgical History:  Past Surgical History:  Procedure Laterality Date   APPLICATION OF CRANIAL NAVIGATION N/A 8/Olivia/2022   Procedure: APPLICATION OF CRANIAL NAVIGATION;  Surgeon: Judith Part, MD;  Location: [place];  Service: Neurosurgery;  Laterality: N/A;   BREAST BIOPSY Right    CHOLECYSTECTOMY     CRANIOTOMY Right 8/Olivia/2022   Procedure: Right craniotomy for tumor resection;  Surgeon: Judith Part, MD;  Location: [place];  Service: Neurosurgery;  Laterality: Right;   EYE SURGERY     as a child   HERNIA REPAIR     HPI:  Olivia Shah s/p 8/23 resection of planum meningioma R craniotomy and noted to have seizure after procedure MRI (+) acute infarcts R anterior temporal lobe inferior frontal lobe, lentiform nucleus and posterior limb of the R internal capsule  PMH congenitally blind OD, arthritis, DM HTN   Assessment / Plan / Recommendation Clinical Impression  Pt lethargic, opened eyes briefly and spontanesously, not necessarily related to stimulus. She followed 1 of 5 commands; no focal oral-motor weakness on observation. She did not vocalize during assessment. Her labial seal, manipulation of ice chip impaired and no observable attempts to transit given additional time. Oral cavity cleared with oral suctioning. Recommend continue NPO, oral care and RN notifying Cortrak for alterante means nutrition. ST will continue to diagnostically treat swallow with appropriate recommendations. SLP Visit Diagnosis: Dysphagia, unspecified (R13.10)    Aspiration Risk  Moderate  aspiration risk    Diet Recommendation NPO   Medication Administration: Via alternative means    Other  Recommendations Oral Care Recommendations: Oral care QID   Follow up Recommendations  (TBA)      Frequency and Duration min 2x/week  2 weeks       Prognosis Prognosis for Safe Diet Advancement: Good Barriers to Reach Goals: Cognitive deficits      Swallow Study   General HPI: Olivia Shah s/p 8/23 resection of planum meningioma R craniotomy and noted to have seizure after procedure MRI (+) acute infarcts R anterior temporal lobe inferior frontal lobe, lentiform nucleus and posterior limb of the R internal capsule  PMH congenitally blind OD, arthritis, DM HTN Type of Study: Bedside Swallow Evaluation Previous Swallow Assessment: none Diet Prior to this Study: NPO Temperature Spikes Noted: No Respiratory Status: Nasal cannula History of Recent Intubation: No Behavior/Cognition: Lethargic/Drowsy;Requires cueing Oral Cavity Assessment: Dry Oral Care Completed by SLP: Yes Oral Cavity - Dentition: Adequate natural dentition Vision: Impaired for self-feeding Self-Feeding Abilities: Total assist Patient Positioning: Upright in bed Baseline Vocal Quality: Other (comment) (no vocalizations) Volitional Cough: Cognitively unable to elicit Volitional Swallow: Unable to elicit    Oral/Motor/Sensory Function Overall Oral Motor/Sensory Function:  (no focal weakness noted)   Ice Chips Ice chips: Impaired Presentation: Spoon Oral Phase Impairments: Reduced labial seal;Reduced lingual movement/coordination;Poor awareness of bolus Oral Phase Functional Implications: Oral holding;Oral residue Pharyngeal Phase Impairments:  (no swallow initiated)   Thin Liquid Thin Liquid: Not tested    Nectar Thick Nectar Thick Liquid: Not tested   Honey Thick Honey Thick Liquid: Not tested   Puree  Puree: Not tested   Solid     Solid: Not tested      Houston Siren 05/20/2021,1:43 PM  Orbie Pyo [place].Ed Risk analyst [phone] Office [phone]

## 2021-05-20 NOTE — Care Plan (Signed)
LTM EEG reviewed till 1610 and showed 5 seizures arising from right hemisphere.  Dr. Venetia Constable and patient's RN has been notified.  Please review final report tomorrow for more details.  Olivia Shah

## 2021-05-21 ENCOUNTER — Inpatient Hospital Stay (HOSPITAL_COMMUNITY): Payer: Medicare Other

## 2021-05-21 DIAGNOSIS — G9340 Encephalopathy, unspecified: Secondary | ICD-10-CM | POA: Diagnosis not present

## 2021-05-21 DIAGNOSIS — D496 Neoplasm of unspecified behavior of brain: Secondary | ICD-10-CM | POA: Diagnosis not present

## 2021-05-21 DIAGNOSIS — G40901 Epilepsy, unspecified, not intractable, with status epilepticus: Secondary | ICD-10-CM | POA: Diagnosis not present

## 2021-05-21 DIAGNOSIS — R569 Unspecified convulsions: Secondary | ICD-10-CM

## 2021-05-21 LAB — BASIC METABOLIC PANEL
Anion gap: 8 (ref 5–15)
BUN: 20 mg/dL (ref 8–23)
CO2: 26 mmol/L (ref 22–32)
Calcium: 8.5 mg/dL — ABNORMAL LOW (ref 8.9–10.3)
Chloride: 107 mmol/L (ref 98–111)
Creatinine, Ser: 0.74 mg/dL (ref 0.44–1.00)
GFR, Estimated: >60 mL/min (ref >60)
Glucose, Bld: 227 mg/dL — ABNORMAL HIGH (ref 70–99)
Potassium: 3.4 mmol/L — ABNORMAL LOW (ref 3.5–5.1)
Sodium: 141 mmol/L (ref 135–145)

## 2021-05-21 LAB — CBC
HCT: 35.7 % — ABNORMAL LOW (ref 36.0–46.0)
Hemoglobin: 11.4 g/dL — ABNORMAL LOW (ref 12.0–15.0)
MCH: 28.4 pg (ref 26.0–34.0)
MCHC: 31.9 g/dL (ref 30.0–36.0)
MCV: 89 fL (ref 80.0–100.0)
Platelets: 272 10*3/uL (ref 150–400)
RBC: 4.01 MIL/uL (ref 3.87–5.11)
RDW: 14.7 % (ref 11.5–15.5)
WBC: 8.3 10*3/uL (ref 4.0–10.5)
nRBC: 0 % (ref 0.0–0.2)

## 2021-05-21 LAB — GLUCOSE, CAPILLARY
Glucose-Capillary: 202 mg/dL — ABNORMAL HIGH (ref 70–99)
Glucose-Capillary: 207 mg/dL — ABNORMAL HIGH (ref 70–99)
Glucose-Capillary: 230 mg/dL — ABNORMAL HIGH (ref 70–99)
Glucose-Capillary: 153 mg/dL — ABNORMAL HIGH (ref 70–99)
Glucose-Capillary: 240 mg/dL — ABNORMAL HIGH (ref 70–99)

## 2021-05-21 LAB — HEPATIC FUNCTION PANEL
ALT: 11 U/L (ref 0–44)
AST: 17 U/L (ref 15–41)
Albumin: 2.6 g/dL — ABNORMAL LOW (ref 3.5–5.0)
Alkaline Phosphatase: 43 U/L (ref 38–126)
Bilirubin, Direct: 0.2 mg/dL (ref 0.0–0.2)
Indirect Bilirubin: 0.5 mg/dL (ref 0.3–0.9)
Total Bilirubin: 0.7 mg/dL (ref 0.3–1.2)
Total Protein: 5.5 g/dL — ABNORMAL LOW (ref 6.5–8.1)

## 2021-05-21 LAB — POCT I-STAT 7, (LYTES, BLD GAS, ICA,H+H)
Acid-Base Excess: 5 mmol/L — ABNORMAL HIGH (ref 0.0–2.0)
Bicarbonate: 29 mmol/L — ABNORMAL HIGH (ref 20.0–28.0)
Calcium, Ion: 1.14 mmol/L — ABNORMAL LOW (ref 1.15–1.40)
HCT: 32 % — ABNORMAL LOW (ref 36.0–46.0)
Hemoglobin: 10.9 g/dL — ABNORMAL LOW (ref 12.0–15.0)
O2 Saturation: 100 %
Patient temperature: 98.8
Potassium: 3.2 mmol/L — ABNORMAL LOW (ref 3.5–5.1)
Sodium: 142 mmol/L (ref 135–145)
TCO2: 30 mmol/L (ref 22–32)
pCO2 arterial: 38.4 mmHg (ref 32.0–48.0)
pH, Arterial: 7.487 — ABNORMAL HIGH (ref 7.350–7.450)
pO2, Arterial: 155 mmHg — ABNORMAL HIGH (ref 83.0–108.0)

## 2021-05-21 LAB — MAGNESIUM
Magnesium: 1.8 mg/dL (ref 1.7–2.4)
Magnesium: 1.8 mg/dL (ref 1.7–2.4)

## 2021-05-21 LAB — PHOSPHORUS
Phosphorus: 3.1 mg/dL (ref 2.5–4.6)
Phosphorus: 1.5 mg/dL — ABNORMAL LOW (ref 2.5–4.6)

## 2021-05-21 MED ORDER — ETOMIDATE 2 MG/ML IV SOLN
INTRAVENOUS | Status: AC
  Administered 2021-05-21: 20 mg via INTRAVENOUS
  Filled 2021-05-21: qty 20

## 2021-05-21 MED ORDER — SODIUM CHLORIDE 0.9 % IV SOLN
1500.0000 mg | Freq: Once | INTRAVENOUS | Status: AC
  Administered 2021-05-21: 1500 mg via INTRAVENOUS
  Filled 2021-05-21: qty 30

## 2021-05-21 MED ORDER — SODIUM CHLORIDE 0.9 % IV SOLN
1000.0000 mg | Freq: Once | INTRAVENOUS | Status: AC
  Administered 2021-05-21: 1000 mg via INTRAVENOUS
  Filled 2021-05-21: qty 7.69

## 2021-05-21 MED ORDER — FENTANYL CITRATE (PF) 100 MCG/2ML IJ SOLN
INTRAMUSCULAR | Status: AC
  Administered 2021-05-21: 100 ug via INTRAVENOUS
  Filled 2021-05-21: qty 2

## 2021-05-21 MED ORDER — PHENOBARBITAL SODIUM 130 MG/ML IJ SOLN
1000.0000 mg | INTRAMUSCULAR | Status: DC
  Filled 2021-05-21: qty 7.69

## 2021-05-21 MED ORDER — PHENYTOIN SODIUM 50 MG/ML IJ SOLN
100.0000 mg | Freq: Three times a day (TID) | INTRAMUSCULAR | Status: DC
  Administered 2021-05-21 – 2021-05-28 (×21): 100 mg via INTRAVENOUS
  Filled 2021-05-21 (×22): qty 2

## 2021-05-21 MED ORDER — ROCURONIUM BROMIDE 10 MG/ML (PF) SYRINGE
PREFILLED_SYRINGE | INTRAVENOUS | Status: AC
  Administered 2021-05-21: 50 mg via INTRAVENOUS
  Filled 2021-05-21: qty 10

## 2021-05-21 MED ORDER — FENTANYL CITRATE (PF) 100 MCG/2ML IJ SOLN
25.0000 ug | INTRAMUSCULAR | Status: DC | PRN
  Administered 2021-05-23 – 2021-05-24 (×2): 50 ug via INTRAVENOUS
  Filled 2021-05-21 (×2): qty 2

## 2021-05-21 MED ORDER — SODIUM CHLORIDE 0.9 % IV SOLN
INTRAVENOUS | Status: DC | PRN
  Administered 2021-05-21: 1000 mL via INTRAVENOUS
  Administered 2021-05-25 – 2021-05-27 (×2): 500 mL via INTRAVENOUS

## 2021-05-21 MED ORDER — FENTANYL CITRATE (PF) 100 MCG/2ML IJ SOLN
100.0000 ug | Freq: Once | INTRAMUSCULAR | Status: AC
Start: 2021-05-21 — End: 2021-05-21

## 2021-05-21 MED ORDER — SODIUM CHLORIDE 0.9 % IV SOLN
200.0000 mg | Freq: Two times a day (BID) | INTRAVENOUS | Status: DC
  Administered 2021-05-21 – 2021-06-04 (×28): 200 mg via INTRAVENOUS
  Filled 2021-05-21 (×31): qty 20

## 2021-05-21 MED ORDER — ORAL CARE MOUTH RINSE
15.0000 mL | OROMUCOSAL | Status: DC
  Administered 2021-05-21 – 2021-06-04 (×132): 15 mL via OROMUCOSAL

## 2021-05-21 MED ORDER — INSULIN DETEMIR 100 UNIT/ML ~~LOC~~ SOLN
5.0000 [IU] | Freq: Every day | SUBCUTANEOUS | Status: DC
  Administered 2021-05-21: 5 [IU] via SUBCUTANEOUS
  Filled 2021-05-21 (×2): qty 0.05

## 2021-05-21 MED ORDER — ETOMIDATE 2 MG/ML IV SOLN: 20.0000 mg | Freq: Once | INTRAVENOUS | Status: AC

## 2021-05-21 MED ORDER — ROCURONIUM BROMIDE 50 MG/5ML IV SOLN
50.0000 mg | Freq: Once | INTRAVENOUS | Status: AC
  Filled 2021-05-21: qty 5

## 2021-05-21 MED ORDER — BISACODYL 10 MG RE SUPP
10.0000 mg | Freq: Every day | RECTAL | Status: DC | PRN
  Administered 2021-05-23: 10 mg via RECTAL
  Filled 2021-05-21 (×2): qty 1

## 2021-05-21 MED ORDER — POLYETHYLENE GLYCOL 3350 17 G PO PACK
17.0000 g | PACK | Freq: Every day | ORAL | Status: DC
  Administered 2021-05-21 – 2021-05-31 (×7): 17 g via ORAL
  Filled 2021-05-21 (×8): qty 1

## 2021-05-21 MED ORDER — DOCUSATE SODIUM 100 MG PO CAPS: 100.0000 mg | ORAL_CAPSULE | Freq: Every day | ORAL | Status: DC | PRN

## 2021-05-21 MED ORDER — SODIUM CHLORIDE 0.9 % IV SOLN
400.0000 mg | INTRAVENOUS | Status: AC
  Administered 2021-05-21: 400 mg via INTRAVENOUS
  Filled 2021-05-21: qty 40

## 2021-05-21 MED ORDER — PROPOFOL 1000 MG/100ML IV EMUL
5.0000 ug/kg/min | INTRAVENOUS | Status: DC
Start: 2021-05-21 — End: 2021-05-25
  Administered 2021-05-21: 10 ug/kg/min via INTRAVENOUS
  Administered 2021-05-22 – 2021-05-23 (×4): 15 ug/kg/min via INTRAVENOUS
  Filled 2021-05-21 (×4): qty 100

## 2021-05-21 MED ORDER — CHLORHEXIDINE GLUCONATE 0.12% ORAL RINSE (MEDLINE KIT)
15.0000 mL | Freq: Two times a day (BID) | OROMUCOSAL | Status: DC
  Administered 2021-05-21 – 2021-06-03 (×26): 15 mL via OROMUCOSAL

## 2021-05-21 NOTE — Consult Note (Signed)
NEURO HOSPITALIST CONSULT NOTE   Requesting physician: Dr. Lucile Shutters   Reason for Consult:Seizures following meningioma resection  History obtained from:  Chart     HPI:                                                                                                                                          Olivia Shah is an 65 y.o. female with a history of DM2, HTN and congenital right sided blindness who presented with progressive loss of her left side vision. 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 The meningioma was resected on 8/22.   MRI was obtained at 0919 on 8/23, revealing expected post-op changes as well as unexpected finding of acute infarcts in the right anterior temporal lobe, inferior frontal lobe, lentiform nucleus and posterior limb of the right internal capsule with mild associated edema.  After her MRI, at about 1030 on 8/23 she had a GTC seizure lasting 30 seconds, which stopped spontaneously. Keppra 1000 mg BID was started. She had quickly recovered back to baseline after the seizure. However, she then progressively became encephalopathic.    LTM EEG was ordered. Tonight, LTM EEG revealed 2 seizures occurring at approximately 12:10-12:25 AM. Neurology was consulted by CCM to further evaluate.   Past Medical History:  Diagnosis Date   Arthritis    Diabetes mellitus without complication ([place])    Hypertension     Past Surgical History:  Procedure Laterality Date   APPLICATION OF CRANIAL NAVIGATION N/A 05/18/2021   Procedure: APPLICATION OF CRANIAL NAVIGATION;  Surgeon: Judith Part, MD;  Location: [place];  Service: Neurosurgery;  Laterality: N/A;   BREAST BIOPSY Right    CHOLECYSTECTOMY     CRANIOTOMY Right 05/18/2021   Procedure: Right craniotomy for tumor resection;  Surgeon: Judith Part, MD;  Location: [place];  Service: Neurosurgery;  Laterality: Right;   EYE SURGERY      as a child   HERNIA REPAIR      Family History  Problem Relation Age of Onset   Breast cancer Neg Hx               Social History:  reports that she has never smoked. She has never used smokeless tobacco. No history on file for alcohol use and drug use.  Allergies  Allergen Reactions   Cashew Nut (Anacardium Occidentale) Skin Test Hives    Hives to multiple nuts   Lisinopril Cough   Shellfish Allergy Hives    MEDICATIONS:  Prior to Admission:  Medications Prior to Admission  Medication Sig Dispense Refill Last Dose   amLODipine (NORVASC) 10 MG tablet Take 10 mg by mouth daily.   05/18/2021 at 0400   Ascorbic Acid (VITAMIN C) 1000 MG tablet Take 1,000 mg by mouth daily.   05/10/2021   aspirin EC 81 MG tablet Take 81 mg by mouth daily.   05/04/2021   Calcium Carb-Cholecalciferol (CALCIUM 600 + D PO) Take 1 tablet by mouth 2 (two) times daily.   05/10/2021   dorzolamide-timolol (COSOPT) 22.3-6.8 MG/ML ophthalmic solution Place 1 drop into the left eye 2 (two) times daily.   [id number] at [id number]   Garlic 123XX123 MG CAPS Take 1,000 mg by mouth daily.   05/10/2021   irbesartan-hydrochlorothiazide (AVALIDE) 150-12.5 MG tablet Take 1 tablet by mouth daily.   05/17/2021 at 1100   latanoprost (XALATAN) 0.005 % ophthalmic solution Place 1 drop into the left eye at bedtime.   05/17/2021 at 2300   metFORMIN (GLUCOPHAGE) 500 MG tablet Take 500-1,000 mg by mouth See admin instructions. Take 1000 mg by mouth in the morning and 500 mg in the evening   05/17/2021 at 2200   metoprolol (LOPRESSOR) 100 MG tablet Take 200 mg by mouth at bedtime.   05/17/2021 at 2200   Multiple Vitamin (MULTIVITAMIN WITH MINERALS) TABS tablet Take 1 tablet by mouth daily.   05/10/2021   vitamin E 180 MG (400 UNITS) capsule Take 400 Units by mouth daily.   05/04/2021   Scheduled:  amLODipine  10 mg Per Tube Daily    chlorhexidine  15 mL Mouth Rinse BID   Chlorhexidine Gluconate Cloth  6 each Topical Q0600   docusate  100 mg Per Tube BID   dorzolamide-timolol  1 drop Left Eye BID   feeding supplement (PROSource TF)  45 mL Per Tube TID   heparin injection (subcutaneous)  5,000 Units Subcutaneous Q8H   irbesartan  150 mg Per Tube Daily   And   hydrochlorothiazide  12.5 mg Per Tube Daily   insulin aspart  0-15 Units Subcutaneous Q4H   latanoprost  1 drop Left Eye QHS   mouth rinse  15 mL Mouth Rinse q12n4p   metoprolol tartrate  200 mg Per Tube QHS   multivitamin with minerals  1 tablet Per Tube Daily   phenytoin (DILANTIN) IV  100 mg Intravenous Q8H   vitamin E  400 Units Per Tube Daily   Continuous:  sodium chloride 10 mL/hr at 05/21/21 0100   feeding supplement (JEVITY 1.2 CAL) Stopped (05/21/21 0139)   levETIRAcetam       ROS:                                                                                                                                       Unable to obtain due to obtundation.    Blood pressure 122/68, pulse 79, temperature 98.6 F (37  C), temperature source Axillary, resp. rate 16, height '5\' 2"'$  (1.575 m), weight 85.5 kg, SpO2 97 %.   General Examination:                                                                                                       Physical Exam  HEENT-  Postoperative changes noted. Sutures appear C/D/I. Lungs- Sonorous respirations occur when attempting to arouse the patient from sleep.  Extremities- Warm and well perfused   Neurological Examination Mental Status: Lethargic with arousal to a mildly agitated somnolent state without eye opening following extended light noxious stimulus. Nonverbal. Not following any commands or answering any questions. Sonorous respiratoins. Will swat weakly in the vicinity of examiner's hand with her RUE during brief arousals, but does not otherwise localize.  Cranial Nerves: II: Right pupil small, irregular  and unreactive. Left pupil 3 mm and reactive.   III,IV, VI: With eyelids held open briefly by examiner, eyes are midline without forced deviation.  V: Turns head weakly in response to light brow ridge pressure VII: Face grossly symmetric, but not grimacing to noxious.  VIII: Not responding to voice IX,X: Unable to assess XI: Head midline XII: Unable to assess Motor/Sensory: Not following commands.  LUE falls to bed almost immediately after passive elevation and release by examiner. RUE also falls to bed but with a delay. Moves RUE to noxious. Minimal LUE movement to noxious.  Brisk withdrawal of RLE to noxious. Withdraws LLE to noxious after a significant delay.  Deep Tendon Reflexes: Crossed adductor in response to patellar reflex elicitation bilaterally. Lower amplitude upper an lower extremity reflexes on the left relative to the right.  Plantars: Right: downgoing   Left: downgoing Cerebellar/Gait: Unable to assess    Lab Results: Basic Metabolic Panel: Recent Labs  Lab 05/18/21 1453 05/20/21 1645  CREATININE 0.73  --   MG  --  1.8  PHOS  --  2.1*    CBC: Recent Labs  Lab 05/18/21 1453  WBC 11.2*  HGB 13.4  HCT 41.9  MCV 88.4  PLT 214    Cardiac Enzymes: No results for input(s): CKTOTAL, CKMB, CKMBINDEX, TROPONINI in the last 168 hours.  Lipid Panel: No results for input(s): CHOL, TRIG, HDL, CHOLHDL, VLDL, LDLCALC in the last 168 hours.  Imaging: MR BRAIN W WO CONTRAST  Result Date: 05/19/2021 EXAM: MRI HEAD WITHOUT AND WITH CONTRAST TECHNIQUE: Multiplanar, multiecho pulse sequences of the brain and surrounding structures were obtained without and with intravenous contrast. CONTRAST:  8.6m GADAVIST GADOBUTROL 1 MMOL/ML IV SOLN COMPARISON:  MRI 04/21/2021. FINDINGS: Brain: Right pterional craniotomy and resection an anterior skull base meningioma. No significant residual masslike enhancement. Acute infarcts in the right anterior temporal lobe, inferior frontal lobe,  lentiform nucleus, and posterior limb of the right internal capsule. Acute infarcts in the anterior right temporal lobe, inferior frontal lobe, right thalamus and posterior limb of the right internal capsule with mild associated edema. Moderate right greater than left antidependent pneumocephalus (measuring up to 1.8 cm in thickness on the right) and small volume right-sided extra-axial hemorrhage  and/or gelfoam (0.9 cm thick) subjacent to the craniotomy. Resulting 3 mm of leftward midline shift at the level of the foramen of Monroe (series 4, image 14) with effacement of the right lateral ventricle. No hydrocephalus. Basal cisterns are patent. Additional scattered mild T2 hyperintensities the white matter, nonspecific likely related to chronic microvascular ischemic disease Vascular: Major arterial voids are maintained at the skull base. Skull and upper cervical spine: Right pterional craniotomy. Expected overlying scalp. Otherwise, marrow signal. Sinuses/Orbits: Clear sinuses.  Unremarkable orbits. Other: No mastoid effusions. IMPRESSION: 1. Interval right pterional craniotomy and resection of an anterior skull base meningioma. No significant residual masslike enhancement. This study will serve as a new baseline future follow-up imaging. 2. Acute infarcts in the right anterior temporal lobe, inferior frontal lobe, lentiform nucleus and posterior limb of the right internal capsule with mild associated edema. 3. Moderate right greater than left antidependent pneumocephalus (up to 1.8 cm thick) and small volume right-sided extra-axial hemorrhage and/or gelfoam subjacent to the craniotomy (up to 0.9 cm thick) with resulting 3 mm of leftward midline shift and effacement of the right lateral ventricle. These results will be called to the ordering clinician or representative by the Radiologist Assistant, and communication documented in the PACS or [employer]. Electronically Signed   By: Margaretha Sheffield M.D.   On:  05/19/2021 10:35   DG Abd Portable 1V  Result Date: 05/20/2021 CLINICAL DATA:  Feeding tube placement EXAM: PORTABLE ABDOMEN - 1 VIEW COMPARISON:  None. FINDINGS: Limited radiograph of the lower chest and upper abdomen was obtained for the purposes of enteric tube localization. Large-bore enteric tube is seen coursing below the diaphragm with distal tip terminating within the expected location of the distal stomach. IMPRESSION: Large bore enteric tube with distal tip terminating within the expected location of the distal stomach. Electronically Signed   By: Davina Poke D.O.   On: 05/20/2021 14:36   EEG adult  Result Date: 05/20/2021 Lora Havens, MD     05/20/2021 11:42 AM Patient Name: Olivia Shah MRN: [id number] Epilepsy Attending: Lora Havens Referring Physician/Provider: Dr Emelda Brothers Date: 05/20/2021 Duration: 27.03 mins Patient history: 65 y.o. woman s/p resection of planum meningioma, recovering well. 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 Post-op course w/ GTC on POD1, started keppra 8/23.  Continues to be encephalopathic.  EEG to evaluate for seizures. Level of alertness: Awake,asleep AEDs during EEG study: LEV Technical aspects: This EEG study was done with scalp electrodes positioned according to the 10-20 International system of electrode placement. Electrical activity was acquired at a sampling rate of '500Hz'$  and reviewed with a high frequency filter of '70Hz'$  and a low frequency filter of '1Hz'$ . EEG data were recorded continuously and digitally stored. Description: No clear posterior dominant rhythm was seen.  Sleep was characterized by sleep spindles (12 to 14 Hz), maximal frontocentral region.  EEG showed continuous generalized and maximal right frontotemporal 5 to 6 Hz theta as well as intermittent generalized 2 to 3 Hz delta slowing.  Hyperventilation and photic stimulation were not performed.   ABNORMALITY -Continuous slow, generalized and maximal  right frontotemporal region IMPRESSION: This study is suggestive of cortical dysfunction in right frontotemporal region likely secondary to underlying structural abnormality.  There is also moderate diffuse encephalopathy, nonspecific etiology.  No seizures or definite epileptiform discharges were seen throughout the recording. Lora Havens     Assessment: 65 year old female on the Neurosurgery service, with new onset seizures  s/p right pterional craniotomy for meningioma resection which was also complicated by right hemispheric strokes.  1. EEG report from 11:27 AM today: Continuous slow, generalized and maximal right frontotemporal region. Findings suggestive of cortical dysfunction in right frontotemporal region likely secondary to underlying structural abnormality.  There is also moderate diffuse encephalopathy, nonspecific etiology.  No seizures or definite epileptiform discharges were seen throughout the recording. 2. LTM EEG, which is currently ongoing. 2 seizures occurring at approximately 12:10-12:25 AM.  3. MRI brain: Interval right pterional craniotomy and resection of an anterior skull base meningioma. No significant residual masslike enhancement. This study will serve as a new baseline future follow-up imaging. Acute infarcts in the right anterior temporal lobe, inferior frontal lobe, lentiform nucleus and posterior limb of the right internal capsule with mild associated edema. Moderate right greater than left antidependent pneumocephalus (up to 1.8 cm thick) and small volume right-sided extra-axial hemorrhage and/or gelfoam subjacent to the craniotomy (up to 0.9 cm thick) with resulting 3 mm of leftward midline shift and effacement of the right lateral ventricle. 4. Exam reveals a lethargic patient with findings suggestive of [facility] defict on the left, most likely corresponding to the acute right cerebral hemisphere strokes seen on MRI.  5. Currently on Keppra 1500 mg IV  BID  Recommendations: 1. Continue Keppra 1500 mg IV BID 2. Loading with fosphenytoin, 20 mg PE/kg x 1 now. Continue with scheduled phenytoin at 100 mg IV TID 3. Continue LTM EEG 4. Inpatient seizure precautions.  5. Stroke was most likely perioperative, but possibly predisposing cardiac or vascular factors. Recommend obtaining a basic stroke work up.   40 minutes spent in the neurological evaluation and management of this critically ill patient.     Electronically signed: Dr. Kerney Elbe 05/21/2021, 12:24 AM

## 2021-05-21 NOTE — Progress Notes (Signed)
Subjective: Seizures briefly improved yesterday but then worsened again overnight and have now progressed to status epilepticus. Patient's sister and friend at bedside.  ROS: Unable to obtain due to poor mental status  Examination  Vital signs in last 24 hours: Temp:  [98 F (36.7 C)-98.9 F (37.2 C)] 98.4 F (36.9 C) (08/25 0700) Pulse Rate:  [60-90] 90 (08/25 1000) Resp:  [13-20] 18 (08/25 1000) BP: (99-154)/(60-83) 144/79 (08/25 1000) SpO2:  [89 %-99 %] 90 % (08/25 1000)  General: lying in bed, NAD CVS: pulse-normal rate and rhythm RS: breathing comfortably, symmetric expansion Extremities: normal, warm Neuro: Awake, does not follow commands, does not track examiner in room, appears to have left hemineglect, no forced gaze deviation, spontaneously moving all 4 extremities right more than left, had multiple episodes of left facial twitching during my assessment consistent with focal motor status epilepticus.   Basic Metabolic Panel: Recent Labs  Lab 05/18/21 1453 05/20/21 1645 05/21/21 0223  CREATININE 0.73  --   --   MG  --  1.8 1.8  PHOS  --  2.1* 3.1    CBC: Recent Labs  Lab 05/18/21 1453  WBC 11.2*  HGB 13.4  HCT 41.9  MCV 88.4  PLT 214     Coagulation Studies: No results for input(s): LABPROT, INR in the last 72 hours.  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 No significant residual masslike enhancement.This study will serve as a new baseline future follow-up imaging. 2. Acute infarcts in the right anterior temporal lobe, inferior frontal lobe, lentiform nucleus and posterior limb of the right internal capsule with mild associated edema. 3. Moderate right greater than left antidependent pneumocephalus (up to 1.8 cm thick) and small volume right-sided extra-axial hemorrhage and/or gelfoam subjacent to the craniotomy (up to 0.9 cm thick) withresulting  3 mm of leftward midline shift and effacement of the right lateral ventricle.             ASSESSMENT AND PLAN: 65 year old female on the Neurosurgery service, with new onset seizures s/p right pterional craniotomy for meningioma resection which was also complicated by right hemispheric strokes.   Focal convulsive status epilepticus, refractory. Meningioma status post right pterional craniotomy Acute right hemispheric strokes  Hypoproteinemia with hypoalbuminemia Acute encephalopathy, postictal -LTM EEG showed focal convulsive status epilepticus  Recommendations -Already on Keppra 1500 mg twice daily, was loaded with fosphenytoin last night and currently on phenytoin 100 mg every 8 hours -We will load with Vimpat 400 mg once and start 200 mg twice daily -If seizures continue, will load with phenobarbital followed by perampanel - Discussed with patient's family at bedside as well as niece on phone that if seizures continue we will eventually need to proceed with intubation, start propofol for burst suppression -Patient family is agreement with the plan.  -Updated critical care team about my plan -Management of rest of comorbidities per primary team  CRITICAL CARE Performed by: Lora Havens   Total critical care time: 35 minutes  Critical care time was exclusive of separately billable procedures and treating other patients.  Critical care was necessary to treat or prevent imminent or life-threatening deterioration.  Critical care was time spent personally by me on the following activities: development of treatment plan with patient and/or surrogate as well as nursing, discussions with consultants, evaluation of patient's response to treatment, examination of patient, obtaining history from patient or surrogate, ordering and performing  treatments and interventions, ordering and review of laboratory studies, ordering and review of radiographic studies, pulse oximetry and  re-evaluation of patient's condition.     Zeb Comfort Epilepsy [facility] For questions after 5pm please refer to AMION to reach the Neurologist on call

## 2021-05-21 NOTE — Progress Notes (Signed)
Neurosurgery Service Progress Note  Subjective: Unfortunately progressed to clinical status overnight with focal L facial twitching and some automatisms  Objective: Vitals:   05/21/21 0629 05/21/21 0700 05/21/21 0800 05/21/21 0900  BP:   119/64 113/66  Pulse: 67  69 70  Resp: '14  15 16  '$ Temp:  98.4 F (36.9 C)    TempSrc:  Axillary    SpO2: 95%  94% 95%  Weight:      Height:        Physical Exam: Eyes open, not responding to commands, moving R side spontaneously, obvious L facial twitching that appears to correlated with L muscle artifact & R sided spikes on EMG   Assessment & Plan: 65 y.o. woman s/p resection of planum meningioma, recovering well. 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 Post-op course w/ GTC on POD1, started keppra 8/23  -in status epilepticus, f/u CCM & neurology recs, on high dose keppra + PHT -SCDs/TEDs, SQH  Darryll Raju A Shakthi Scipio  05/21/21 10:00 AM

## 2021-05-21 NOTE — Care Plan (Signed)
EEG reviewed.  Sequence of seizures has improved compared to earlier this morning but continues to have multiple seizures per hour.  We will load with IV phenobarb 1000 mg once as discussed with family earlier today.  Notified critical care team and Dr. Venetia Constable.  Leeana Creer Barbra Sarks

## 2021-05-21 NOTE — Progress Notes (Addendum)
NAME:  Olivia Shah, MRN:  [id number], DOB:  1956-03-28, LOS: 3 ADMISSION DATE:  05/18/2021, CONSULTATION DATE:  05/20/21 REFERRING MD:  Zada Finders, CHIEF COMPLAINT:  AMS, seizures   History of Present Illness:  Olivia Shah is a 65 y.o. female who has PMH including congenital right-sided blindness, HTN, DM, arthritis.  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  She was taken to the OR for right pterional craniotomy which was successful.   The following morning, she was noted to have a 30 second GTC seizure that resolved spontaneously.  She then returned to baseline; however, she was started on Keppra 1g twice daily as prophylaxis.  Unfortunately, later that morning, she had recurrent seizure lasting about 1 minute again resolving spontaneously.   EEG 8/24 AM demonstrated cortical dysfunction in the right frontotemporal region, moderate diffuse encephalopathy, no seizures or definite epileptiform discharges. EEG 8/24 PM was reviewed and showed 5 seizures arising in the right hemisphere which were subclinical.   Due to recurrent seizures, PCCM asked to see in consultation  Pertinent  Medical History  Meningioma HTN DM  Significant Hospital Events: Including procedures, antibiotic start and stop dates in addition to other pertinent events   8/22 > OR for right craniotomy. 8/23 > 2 separate seizures that resolved spontaneously. 8/24 > subclinical seizures (5 noted on EEG). 8/25 > frequency of seizures increasing despite keppra and phenytoin  Interim History / Subjective:  Patient had no issues overnight though seizure activity continued. She was minimally responsive on exam but was able to follow command to squeeze hand though no other commands. She would withdraw to pain but would not open eyes. Left pupil reactive to light. Right eye swollen. Left lower jaw fasciculations noted during exam.  Objective   Blood  pressure 118/75, pulse 67, temperature 98.4 F (36.9 C), temperature source Axillary, resp. rate 14, height '5\' 2"'$  (1.575 m), weight 85.5 kg, SpO2 95 %.        Intake/Output Summary (Last 24 hours) at 05/21/2021 0836 Last data filed at 05/21/2021 0600 Gross per 24 hour  Intake 303.47 ml  Output 700 ml  Net -396.53 ml   Filed Weights   05/18/21 0603 05/18/21 1500  Weight: 86.2 kg 85.5 kg    Examination: General: sleeping in bed, would not awake to tactile or verbal stimuli HENT: Nasal canula and EG tube in place, right eye swollen, craniotomy incision without discharge Lungs: Rhonchi present bilaterally transmitted from upper airway, normal respiratory effort Cardiovascular: regular rate and rhythm, no murmurs Abdomen: soft, non tender, no distention Extremities: warm, no peripheral edema Neuro: would not open eyes to vocal or tactile stimuli, could complete one simple command but nothing besides "squeeze my hand", somnolent, left pupil reactive, withdraws to pain  Labs and imaging personally reviewed CBGs are high LFTs wnl BMP 8/15 wnl - repeat pending today CBC 8/22 wnl - repeat pending today  Resolved Hospital Problem list     Assessment & Plan:   Acute encephalopathy Status epilepticus Impending respiratory failure POD 3 from Anterior skull base meningioma resection Patient has had worsening mentation following her skull base meningioma resection and now new acute right sided infarct with ongoing seizures. She is no longer opening her eyes to voice or stimulation but still withdraws from pain with some vocalization. Given worsening mentation with continued seizures will need to intubate. GCS is 7 and she is no longer consistently withdrawing to pain.  -  currently on keppra, phosphenytoin, vimpat. Phenobarbital added this afternoon per Dr. Hortense Ramal for ongoing seizures.   DM2 with Hyperglycemia - start levemir 5 units daily - continue SSI - holding metformin  HTN -  continue home meds   Best Practice (right click and "Reselect all SmartList Selections" daily)   Diet/type: tubefeeds and NPO DVT prophylaxis: prophylactic heparin  GI prophylaxis: N/A Lines: N/A Foley:  Yes, and it is still needed Code Status:  full code Last date of multidisciplinary goals of care discussion:  Labs   CBC: Recent Labs  Lab 05/18/21 1453  WBC 11.2*  HGB 13.4  HCT 41.9  MCV 88.4  PLT [id number]    Basic Metabolic Panel: Recent Labs  Lab 05/18/21 1453 05/20/21 1645 05/21/21 0223  CREATININE 0.73  --   --   MG  --  1.8 1.8  PHOS  --  2.1* 3.1   GFR: Estimated Creatinine Clearance: 69.3 mL/min (by C-G formula based on SCr of 0.73 mg/dL). Recent Labs  Lab 05/18/21 1453  WBC 11.2*    Liver Function Tests: Recent Labs  Lab 05/21/21 0223  AST 17  ALT 11  ALKPHOS 43  BILITOT 0.7  PROT 5.5*  ALBUMIN 2.6*   No results for input(s): LIPASE, AMYLASE in the last 168 hours. No results for input(s): AMMONIA in the last 168 hours.  ABG No results found for: PHART, PCO2ART, PO2ART, HCO3, TCO2, ACIDBASEDEF, O2SAT   Coagulation Profile: No results for input(s): INR, PROTIME in the last 168 hours.  Cardiac Enzymes: No results for input(s): CKTOTAL, CKMB, CKMBINDEX, TROPONINI in the last 168 hours.  HbA1C: Hgb A1c MFr Bld  Date/Time Value Ref Range Status  05/11/2021 08:31 AM 6.6 (H) 4.8 - 5.6 % Final    Comment:    (NOTE) Pre diabetes:          5.7%-6.4%  Diabetes:              >6.4%  Glycemic control for   <7.0% adults with diabetes     CBG: Recent Labs  Lab 05/18/21 1225 05/20/21 2125 05/21/21 0405 05/21/21 0811  GLUCAP 151* 165* 202* 207*    Critical care time:     Reece Agar, MS4  Patient seen and examined with medical student. The above reflects my impression and plan.    The patient is critically ill due to clinical coma, encephalopathy, respiratory failure status epilepticus.  Critical care was necessary to treat or  prevent imminent or life-threatening deterioration.  Critical care was time spent personally by me on the following activities: development of treatment plan with patient and/or surrogate as well as nursing, discussions with consultants, evaluation of patient's response to treatment, examination of patient, obtaining history from patient or surrogate, ordering and performing treatments and interventions, ordering and review of laboratory studies, ordering and review of radiographic studies, pulse oximetry, re-evaluation of patient's condition and participation in multidisciplinary rounds.   Critical Care Time devoted to patient care services described in this note is 75 minutes. This time reflects time of care of this [place] . This critical care time does not reflect separately billable procedures or procedure time, teaching time or supervisory time of PA/NP/Med student/Med Resident etc but could involve care discussion time.       Spero Geralds [place] [facility] 05/21/2021 2:52 PM  Pager: see AMION  If no response to pager , please call critical care on call (see AMION) until 7pm After 7:00 pm  call Elink

## 2021-05-21 NOTE — Progress Notes (Signed)
OT Cancellation Note  Patient Details Name: Olivia Shah MRN: [id number] DOB: 1956-07-06   Cancelled Treatment:    Reason Eval/Treat Not Completed: Medical issues which prohibited therapy. TPt in focal status epilepticus.  Nilsa Nutting., OTR/L Acute Rehabilitation Services Pager [phone] Office [phone]   Lucille Passy M 05/21/2021, 10:43 AM

## 2021-05-21 NOTE — Procedures (Signed)
Intubation Procedure Note  Olivia Shah  [id number]  09/12/1956  Date:05/21/21  Time:2:29 PM   Provider Performing:Hennie Gosa Mauricio Po    Procedure: Intubation ([id number])  Indication(s) Respiratory Failure  Consent Unable to obtain consent due to emergent nature of procedure.   Anesthesia Etomidate, Fentanyl, and Rocuronium   Time Out Verified patient identification, verified procedure, site/side was marked, verified correct patient position, special equipment/implants available, medications/allergies/relevant history reviewed, required imaging and test results available.   Sterile Technique Usual hand hygeine, masks, and gloves were used   Procedure Description Patient positioned in bed supine.  Sedation given as noted above.  Patient was intubated with endotracheal tube using Glidescope.  View was Grade 1 full glottis .  Number of attempts was 1.  Colorimetric CO2 detector was consistent with tracheal placement.   Complications/Tolerance None; patient tolerated the procedure well. Chest X-ray is ordered to verify placement.   EBL Minimal  Lenice Llamas, MD Pulmonary and [place]

## 2021-05-21 NOTE — Progress Notes (Signed)
SLP Cancellation Note  Patient Details Name: Olivia Shah MRN: [id number] DOB: 12/18/1955   Cancelled treatment:       Reason Eval/Treat Not Completed: Lethargy limiting ability to participate. D/W RN. Will continue efforts.  Olivia Shah, [place] Office number [phone] Pager [phone]    Juan Quam Laurice 05/21/2021, 10:59 AM

## 2021-05-21 NOTE — Procedures (Addendum)
Patient Name: Olivia Shah  MRN: [id number]  Epilepsy Attending: Lora Havens  Referring Physician/Provider: Dr Emelda Brothers Duration: 05/20/2021 1052 to 05/21/2021 1052   Patient history: 65 y.o. woman s/p resection of planum meningioma, recovering well. 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 Post-op course w/ GTC on POD1, started keppra 8/23.  Continues to be encephalopathic.  EEG to evaluate for seizures.   Level of alertness: lethargic   AEDs during EEG study: LEV, PHT, LCM   Technical aspects: This EEG study was done with scalp electrodes positioned according to the 10-20 International system of electrode placement. Electrical activity was acquired at a sampling rate of '500Hz'$  and reviewed with a high frequency filter of '70Hz'$  and a low frequency filter of '1Hz'$ . EEG data were recorded continuously and digitally stored.    Description:  EEG initially showed continuous generalized and maximal right frontotemporal 5 to 6 Hz theta as well as intermittent generalized 2 to 3 Hz delta slowing. At around 1443 on 05/20/2021, patient started having seizures, during which either she was noted to have left facial twitching or no clinical signs were noted.  Concomitant EEG initially showed lateralized periodic discharges arising from right frontotemporal region, maximal F8/T8 which gradually evolved into 5 to 6 Hz theta slowing admixed with sharp waves. EEG then involved the left hemisphere and eventually showed 2 to 3 Hz delta slowing.  She had around 9 seizures between 1443 to 1649, average duration about 2 to 3 minutes. Seizure medications were adjusted after which seizures improved.  However, seizures recurred at around 2332 on 05/20/2021 and patient has had average 12 seizures per hour, lasting average 1 minute.  This EEG pattern is consistent with focal status epilepticus.  Lateralized periodic discharges with overriding fast activity was also noted in right hemisphere, maximal  right frontotemporal region.   ABNORMALITY -Focal status epilepticus, right frontotemporal region, maximal F8/T8 -Lateralized periodic discharges with overriding fast activity, right hemisphere, maximal right frontotemporal region (LPD +) -Continuous slow, generalized and maximal right frontotemporal region   IMPRESSION: This study was initially suggestive of cortical dysfunction in right frontotemporal region likely secondary to underlying structural abnormality.  At around 1443 on 05/20/2021, patient started having seizures during which EKG was noted to have left facial twitching and no clinical signs were noted.  Seizures arising from left frontotemporal region, average duration about 2 to 3 minutes.  Patient had around 9 seizures between 40 43-16 49 on 05/20/2021.  Seizure medications were adjusted after which seizures improved.  However seizures recorded on 2332 on 05/20/2021 and patient had average 12 seizures per hour, lasting only 1 minute.  This EEG pattern is consistent with focal status epilepticus.  There is also evidence of moderate diffuse encephalopathy, nonspecific etiology but likely due to seizures.  Neurologist was notified overnight.   Olivia Shah

## 2021-05-21 NOTE — Progress Notes (Signed)
PT Cancellation Note  Patient Details Name: Olivia Shah MRN: [id number] DOB: [birth year]   Cancelled Treatment:    Reason Eval/Treat Not Completed: Medical issues which prohibited therapy this morning. Per discussion with RN and chart review, pt with continued seizure activity, recommends holding therapies this morning. Will continue to follow and attempt as able.   Inocencio Homes, PT, DPT   Acute Rehabilitation Department Pager #: [phone]   Sandra Cockayne 05/21/2021, 8:49 AM

## 2021-05-21 NOTE — Progress Notes (Signed)
EEG maintenance complete. No skin breakdown at FP1 FP2 Fz A2. Continue to monitor

## 2021-05-22 DIAGNOSIS — Z4659 Encounter for fitting and adjustment of other gastrointestinal appliance and device: Secondary | ICD-10-CM | POA: Diagnosis not present

## 2021-05-22 DIAGNOSIS — G40001 Localization-related (focal) (partial) idiopathic epilepsy and epileptic syndromes with seizures of localized onset, not intractable, with status epilepticus: Secondary | ICD-10-CM

## 2021-05-22 LAB — PHENYTOIN LEVEL, TOTAL: Phenytoin Lvl: 13.6 ug/mL (ref 10.0–20.0)

## 2021-05-22 LAB — CBC
HCT: 32.6 % — ABNORMAL LOW (ref 36.0–46.0)
Hemoglobin: 10.6 g/dL — ABNORMAL LOW (ref 12.0–15.0)
MCH: 28.6 pg (ref 26.0–34.0)
MCHC: 32.5 g/dL (ref 30.0–36.0)
MCV: 87.9 fL (ref 80.0–100.0)
Platelets: 235 10*3/uL (ref 150–400)
RBC: 3.71 MIL/uL — ABNORMAL LOW (ref 3.87–5.11)
RDW: 14.8 % (ref 11.5–15.5)
WBC: 8.5 10*3/uL (ref 4.0–10.5)
nRBC: 0 % (ref 0.0–0.2)

## 2021-05-22 LAB — PHENOBARBITAL LEVEL: Phenobarbital: 21.8 ug/mL (ref 15.0–30.0)

## 2021-05-22 LAB — GLUCOSE, CAPILLARY
Glucose-Capillary: 196 mg/dL — ABNORMAL HIGH (ref 70–99)
Glucose-Capillary: 213 mg/dL — ABNORMAL HIGH (ref 70–99)
Glucose-Capillary: 216 mg/dL — ABNORMAL HIGH (ref 70–99)
Glucose-Capillary: 217 mg/dL — ABNORMAL HIGH (ref 70–99)
Glucose-Capillary: 218 mg/dL — ABNORMAL HIGH (ref 70–99)
Glucose-Capillary: 230 mg/dL — ABNORMAL HIGH (ref 70–99)
Glucose-Capillary: 243 mg/dL — ABNORMAL HIGH (ref 70–99)

## 2021-05-22 LAB — BASIC METABOLIC PANEL
Anion gap: 9 (ref 5–15)
BUN: 24 mg/dL — ABNORMAL HIGH (ref 8–23)
CO2: 25 mmol/L (ref 22–32)
Calcium: 8 mg/dL — ABNORMAL LOW (ref 8.9–10.3)
Chloride: 106 mmol/L (ref 98–111)
Creatinine, Ser: 0.76 mg/dL (ref 0.44–1.00)
GFR, Estimated: >60 mL/min (ref >60)
Glucose, Bld: 256 mg/dL — ABNORMAL HIGH (ref 70–99)
Potassium: 3.6 mmol/L (ref 3.5–5.1)
Sodium: 140 mmol/L (ref 135–145)

## 2021-05-22 LAB — TRIGLYCERIDES: Triglycerides: 59 mg/dL (ref <150)

## 2021-05-22 MED ORDER — PANTOPRAZOLE SODIUM 40 MG IV SOLR
40.0000 mg | Freq: Every day | INTRAVENOUS | Status: DC
  Administered 2021-05-22 – 2021-05-30 (×9): 40 mg via INTRAVENOUS
  Filled 2021-05-22 (×9): qty 40

## 2021-05-22 MED ORDER — SODIUM CHLORIDE 0.9 % IV SOLN
2000.0000 mg | Freq: Two times a day (BID) | INTRAVENOUS | Status: DC
  Administered 2021-05-22 – 2021-06-04 (×26): 2000 mg via INTRAVENOUS
  Filled 2021-05-22 (×31): qty 20

## 2021-05-22 MED ORDER — INSULIN DETEMIR 100 UNIT/ML ~~LOC~~ SOLN
8.0000 [IU] | Freq: Every day | SUBCUTANEOUS | Status: DC
  Administered 2021-05-22 – 2021-05-24 (×3): 8 [IU] via SUBCUTANEOUS
  Filled 2021-05-22 (×3): qty 0.08

## 2021-05-22 MED ORDER — PHENOBARBITAL SODIUM 130 MG/ML IJ SOLN
65.0000 mg | Freq: Two times a day (BID) | INTRAMUSCULAR | Status: DC
  Administered 2021-05-22 – 2021-05-23 (×4): 65 mg via INTRAVENOUS
  Filled 2021-05-22 (×4): qty 1

## 2021-05-22 NOTE — Progress Notes (Signed)
LTM EEG reviewed from 6:30 PM to 2:00 AM. Right sided tracings higher amplitude than left with intermittent slow waves on relatively low amplitude background. Some artifactual-appearing sharply contoured waves were seen in runs of slightly higher amplitude background activity intermittently, centered at P8, starting at about 1:10 AM until about 1:40 AM but these did not spread and were non-rhythmic. No event button pushes during the recording.   Will continue to monitor.   Electronically signed: Dr. Kerney Elbe

## 2021-05-22 NOTE — Progress Notes (Signed)
LTM maint complete - no skin breakdown under: A2 T8 F8 Fp2 Maintenance A2  [facility] monitored, Event button test confirmed by [facility].

## 2021-05-22 NOTE — Progress Notes (Signed)
OT Cancellation Note  Patient Details Name: Olivia Shah MRN: [id number] DOB: 1956-09-20   Cancelled Treatment:    Reason Eval/Treat Not Completed: Patient not medically ready (intubated / sedating medications for seizure control) Rn requesting to hold at this time  Billey Chang, OTR/L  Acute Rehabilitation Services Pager: [phone] Office: [phone] .  05/22/2021, 2:53 PM

## 2021-05-22 NOTE — Progress Notes (Signed)
Speech Language Pathology Discharge Patient Details Name: Olivia Shah MRN: [id number] DOB: 1956/04/13 Today's Date: 05/22/2021 Time:  -     Patient discharged from SLP services secondary to medical decline - will need to re-order SLP to resume therapy services. Intubated. ST to sign off. Please reorder if/when appropriate  Please see latest therapy progress note for current level of functioning and progress toward goals.    Progress and discharge plan discussed with patient and/or caregiver: Patient unable to participate in discharge planning and no caregivers available  GO     Houston Siren 05/22/2021, 9:31 AM   Orbie Pyo Colvin Caroli.Ed Risk analyst [phone] Office [phone]

## 2021-05-22 NOTE — Progress Notes (Signed)
Inpatient Rehabilitation Admissions Coordinator   Noted patient intubated. I will follow her progress at a distance.  Danne Baxter, RN, MSN Rehab Admissions Coordinator [phone] 05/22/2021 11:02 AM

## 2021-05-22 NOTE — Procedures (Addendum)
Patient Name: Olivia Shah  MRN: [id number]  Epilepsy Attending: Lora Havens  Referring Physician/Provider: Dr Emelda Brothers Duration: 05/21/2021 1052 to 05/22/2021 1052   Patient history: 65 y.o. woman s/p resection of planum meningioma, recovering well. 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 Post-op course w/ GTC on POD1, started keppra 8/23.  Continues to be encephalopathic.  EEG to evaluate for seizures.   Level of alertness: comatose   AEDs during EEG study: LEV, PHT, LCM, PB, Propofol   Technical aspects: This EEG study was done with scalp electrodes positioned according to the 10-20 International system of electrode placement. Electrical activity was acquired at a sampling rate of '500Hz'$  and reviewed with a high frequency filter of '70Hz'$  and a low frequency filter of '1Hz'$ . EEG data were recorded continuously and digitally stored.    Description:  EEG initially showed seizures, during which either she was noted to have left facial twitching or no clinical signs were noted.  Concomitant EEG initially showed lateralized periodic discharges arising from right frontotemporal region, maximal F8/T8 which gradually evolved into 5 to 6 Hz theta slowing admixed with sharp waves. EEG then involved the left hemisphere and eventually showed 2 to 3 Hz delta slowing.  She had average 12-15 seizures per hour, lasting average 1 minute. This EEG pattern is consistent with focal status epilepticus. Lateralized periodic discharges with overriding fast activity was also noted in right hemisphere, maximal right frontotemporal region.  Patient was eventually intubated on 05/21/2021 at around 1422 and propofol was started after which seizure frequency improved, last seizure was noted on 05/21/2021 at 1651. EEG also showed continuous generalized and maximal right frontotemporal region 3 to 6 Hz theta-delta slowing admixed with sharp waves in right frontotemporal region.  Patient event button  was pressed on 05/21/2021 at 2132 and on 05/22/2021 at 0735. RN reported facial twitching which was difficult to visualize on camera. Concomitant EEG before, during and after the event showed generalized and lateralized right hemisphere 3 to 6 Hz he definitely admixed with abundant sharp waves in right frontotemporal region.  Patient event button was pressed on 05/22/2021 at 1026 and 1032. Patient was noted to have rhythmic left upper extremity jerking. Concomitant EEG before, during and after the event showed generalized and lateralized right hemisphere 3 to 6 Hz  theta-delta slowing.    ABNORMALITY -Focal status epilepticus, right frontotemporal region, maximal F8/T8 -Lateralized periodic discharges with overriding fast activity, right hemisphere, maximal right frontotemporal region (LPD +) -Continuous slow, generalized and maximal right frontotemporal region   IMPRESSION: This study was initially suggestive of focal status epilepticus arising from right frontotemporal region. Patient was intubated and propofol was started on 05/21/2021 at around 1422 after which seizure frequency improved. Last seizure was noted on 05/21/2021 at 1651.  There is also evidence of moderate to severe diffuse encephalopathy, nonspecific etiology but likely due to seizures, sedation.  Patient event button was pressed on 05/21/2021 at 2132 and 05/22/2021 at 0735 during which RN reported facial twitching without concomitant EEG change. However focal motor seizures may not be seen on scalp EEG.  Therefore clinical correlation is recommended.  Patient event button was pressed on 05/22/2021 at 1026 and 1032 for left upper extremity rhythmic jerking. without concomitant EEG change. However the semiology was consistent with focal motor seizure which may not be seen on scalp EEG.  Therefore clinical correlation is recommended.  EEG appears to be improving compared to previous day.   Cashton Hosley Barbra Sarks

## 2021-05-22 NOTE — Progress Notes (Signed)
LTM maint complete - no skin breakdown under:  Fp1 F 7 F3 [facility] monitored, Event button test confirmed by [facility].

## 2021-05-22 NOTE — Progress Notes (Signed)
Neurology progress note  Major Interval events/Subjective:  Seizures continued, patient was loaded with phenobarb and intubated   ROS: Unable to obtain due to poor mental status  Examination  Vital signs in last 24 hours: Temp:  [98.4 F (36.9 C)-99.4 F (37.4 C)] 98.6 F (37 C) (08/26 0800) Pulse Rate:  [71-113] 85 (08/26 0900) Resp:  [13-20] 16 (08/26 0900) BP: (93-146)/(61-124) 112/66 (08/26 0900) SpO2:  [89 %-100 %] 98 % (08/26 0900) FiO2 (%):  [50 %-60 %] 50 % (08/26 0838)  General: lying in bed, NAD CVS: pulse-normal rate and rhythm RS: Comfortable on the ventilator without audible wheezing or clear dyssynchrony Extremities: normal, warm  Neuro:  Mental status: Very sleepy, does not follow commands, does not have any verbal output, does not track examiner. Cranial nerves: Right eye postsurgical, left eye sluggishly reactive, corneals intact, face symmetric within limits of ET tube cough intact per nursing, Sensory/motor: Withdraws in all 4 extremities more briskly on the right than the left to noxious stimulation.  Tremulous quality is noted to her movements  Basic Metabolic Panel: Recent Labs  Lab 05/18/21 1453 05/20/21 1645 05/21/21 0223 05/21/21 1548 05/21/21 1650 05/22/21 0512  NA  --   --   --  142 141 140  K  --   --   --  3.2* 3.4* 3.6  CL  --   --   --   --  107 106  CO2  --   --   --   --  26 25  GLUCOSE  --   --   --   --  227* 256*  BUN  --   --   --   --  20 24*  CREATININE 0.73  --   --   --  0.74 0.76  CALCIUM  --   --   --   --  8.5* 8.0*  MG  --  1.8 1.8  --  1.8  --   PHOS  --  2.1* 3.1  --  1.5*  --      CBC: Recent Labs  Lab 05/18/21 1453 05/21/21 1548 05/21/21 1650 05/22/21 0512  WBC 11.2*  --  8.3 8.5  HGB 13.4 10.9* 11.4* 10.6*  HCT 41.9 32.0* 35.7* 32.6*  MCV 88.4  --  89.0 87.9  PLT 214  --  272 235      Coagulation Studies: No results for input(s): LABPROT, INR in the last 72 hours.  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 No significant residual masslike enhancement.This study will serve as a new baseline future follow-up imaging. 2. Acute infarcts in the right anterior temporal lobe, inferior frontal lobe, lentiform nucleus and posterior limb of the right internal capsule with mild associated edema. 3. Moderate right greater than left antidependent pneumocephalus (up to 1.8 cm thick) and small volume right-sided extra-axial hemorrhage and/or gelfoam subjacent to the craniotomy (up to 0.9 cm thick) withresulting 3 mm of leftward midline shift and effacement of the right lateral ventricle.            EEG  05/21/2021 1052 to 05/22/2021 [id number]:  Description:  EEG initially showed seizures, during which either she was noted to have left facial twitching or no clinical signs were noted.  Concomitant EEG initially showed lateralized periodic discharges arising from right frontotemporal region, maximal F8/T8 which gradually evolved into 5 to 6 Hz theta slowing admixed with  sharp waves. EEG then involved the left hemisphere and eventually showed 2 to 3 Hz delta slowing.  She had average 12-15 seizures per hour, lasting average 1 minute. This EEG pattern is consistent with focal status epilepticus. Lateralized periodic discharges with overriding fast activity was also noted in right hemisphere, maximal right frontotemporal region.  Patient was eventually intubated on 05/21/2021 at around 1422 and propofol was started after which seizure frequency improved, last seizure was noted on 05/21/2021 at 1651. EEG also showed continuous generalized and maximal right frontotemporal region 3 to 6 Hz theta-delta slowing admixed with sharp waves in right frontotemporal region.   Patient event button was pressed on 05/21/2021 at 2132 and on 05/22/2021 at 0735. RN reported facial twitching which was difficult to visualize on  camera. Concomitant EEG before, during and after the event showed generalized and lateralized right hemisphere 3 to 6 Hz he definitely admixed with abundant sharp waves in right frontotemporal region.   ABNORMALITY -Focal status epilepticus, right frontotemporal region, maximal F8/T8 -Lateralized periodic discharges with overriding fast activity, right hemisphere, maximal right frontotemporal region (LPD +) -Continuous slow, generalized and maximal right frontotemporal region   IMPRESSION: This study was initially suggestive of focal status epilepticus arising from right frontotemporal region. Patient was intubated and propofol was started on 05/21/2021 at around 1422 after which seizure frequency improved. Last seizure was noted on 05/21/2021 at 1651.  There is also evidence of moderate to severe diffuse encephalopathy, nonspecific etiology but likely due to seizures, sedation.   Patient event button was pressed on 05/21/2021 at 2132 and 05/22/2021 at 0735 during which RN reported facial twitching without concomitant EEG change. However focal motor seizures may not be seen on scalp EEG.  Therefore clinical correlation is recommended.   EEG appears to be improving compared to previous day.   ASSESSMENT AND PLAN: 65 year old female on the Neurosurgery service, with new onset seizures s/p right pterional craniotomy for meningioma resection which was also complicated by right hemispheric strokes.  Focal motor status is improved with intubation and addition of propofol.  Would holding current dose today and then attempting to reduce this medication tomorrow, with addition of perampanel if needed.  Focal convulsive status epilepticus, refractory, now improving. Meningioma status post right pterional craniotomy Acute right hemispheric strokes  Hypoproteinemia with hypoalbuminemia Acute encephalopathy, postictal -LTM EEG showed focal convulsive status epilepticus, now controlled    Recommendations -Already on Keppra 1500 mg twice daily, which will be continued but may need to be decreased if her renal function worsens -Loaded with fosphenytoin 8/24, currently on phenytoin 100 mg every 8 hours, continue current dose for now  -Level to be obtained today prior to next dose at 13:00 resulted at 13.6 -Loaded with Vimpat 400 mg 8/25, and started 200 mg twice daily, continue current dose for now -Loaded with 1 g phenobarbital 8/26, on 65 mg twice daily, continue current dose for now  -Level to be obtained today prior to next dose at 21:00, to be followed up -Intubated 8/26 and additional on propofol titrated to burst suppression (15 mcg/kg/min) -If seizures recur, when propofol weaned 8/27 will start perampanel - Discussed with patient's family at bedside  -Patient family is agreement with the plan.  -Updated critical care team about my plan -Management of rest of comorbidities per primary team  CRITICAL CARE Performed by: Enis Gash MD-PhD [facility] [phone]  Available 7 AM to 7 PM, outside these hours please contact Neurologist on call listed on  AMION   Total critical care time: 35 minutes  Critical care time was exclusive of separately billable procedures and treating other patients.  Critical care was necessary to treat or prevent imminent or life-threatening deterioration.  Critical care was time spent personally by me on the following activities: development of treatment plan with patient and/or surrogate as well as nursing, discussions with consultants, evaluation of patient's response to treatment, examination of patient, obtaining history from patient or surrogate, ordering and performing treatments and interventions, ordering and review of laboratory studies, ordering and review of radiographic studies, pulse oximetry and re-evaluation of patient's condition.

## 2021-05-22 NOTE — Progress Notes (Signed)
Neurosurgery Service Progress Note  Subjective: Continued status, had to be intubated yesterday afternoon  Objective: Vitals:   05/22/21 0700 05/22/21 0800 05/22/21 0838 05/22/21 0900  BP: 116/63 118/79 118/79 112/66  Pulse: 79 90 82 85  Resp: '16 16 16 16  '$ Temp:  98.6 F (37 C)    TempSrc:  Axillary    SpO2: 100% 100% 99% 98%  Weight:      Height:        Physical Exam: Intubated and sedated, EEG with diffuse slowing, nearing burst suppression  Assessment & Plan: 65 y.o. woman s/p resection of planum meningioma, recovering well. 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 Post-op course w/ GTC on POD1, started keppra 8/23, status epilepticus, 8/25 intubation 2/2 status epilepticus  -in status epilepticus, f/u CCM & neurology recs, on lacosamide 200bid, keppra 2gbid, phenobarb 65bid s/p load, phenytoin 100tid, propofol gtt, overnight EEG shows no seizures after starting propofol -SCDs/TEDs, SQH  Judith Part  05/22/21 9:25 AM

## 2021-05-22 NOTE — Progress Notes (Addendum)
NAME:  Olivia Shah, MRN:  [id number], DOB:  06/07/1956, LOS: 4 ADMISSION DATE:  05/18/2021, CONSULTATION DATE:  05/20/21 REFERRING MD:  Zada Finders, CHIEF COMPLAINT:  AMS, seizures   History of Present Illness:  Olivia Shah is a 65 y.o. female who has PMH including congenital right-sided blindness, HTN, DM, arthritis.  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  She was taken to the OR for right pterional craniotomy which was successful.   The following morning, she was noted to have a 30 second GTC seizure that resolved spontaneously.  She then returned to baseline; however, she was started on Keppra 1g twice daily as prophylaxis.  Unfortunately, later that morning, she had recurrent seizure lasting about 1 minute again resolving spontaneously.   EEG 8/24 AM demonstrated cortical dysfunction in the right frontotemporal region, moderate diffuse encephalopathy, no seizures or definite epileptiform discharges. EEG 8/24 PM was reviewed and showed 5 seizures arising in the right hemisphere which were subclinical.   Due to recurrent seizures, PCCM asked to see in consultation  Pertinent  Medical History  Meningioma HTN DM  Significant Hospital Events: Including procedures, antibiotic start and stop dates in addition to other pertinent events   8/22 > OR for right craniotomy. 8/23 > 2 separate seizures that resolved spontaneously. 8/24 > subclinical seizures (5 noted on EEG). 8/25 > frequency of seizures increasing despite keppra and phenytoin, started vimpat and phenobarbital, Intubated for GCS of 7.  Interim History / Subjective:  Patient had no issues overnight though seizure activity continued. Patient sedated with propofol on mechanical ventilation. She withdraws from pain and does not open eyes. Pupils unequal but congenitally blind in R eye. Left pupil sluggishly reactive to light. Negative babinski.  Objective    Blood pressure 106/88, pulse 91, temperature 98.6 F (37 C), temperature source Axillary, resp. rate 16, height '5\' 2"'$  (1.575 m), weight 85.5 kg, SpO2 98 %.    Vent Mode: PRVC FiO2 (%):  [40 %-60 %] 40 % Set Rate:  [16 bmp] 16 bmp Vt Set:  [400 mL] 400 mL PEEP:  [5 cmH20] 5 cmH20 Plateau Pressure:  [15 cmH20-18 cmH20] 17 cmH20   Intake/Output Summary (Last 24 hours) at 05/22/2021 1059 Last data filed at 05/22/2021 1000 Gross per 24 hour  Intake 1895.45 ml  Output 1300 ml  Net 595.45 ml   Filed Weights   05/18/21 0603 05/18/21 1500  Weight: 86.2 kg 85.5 kg    Examination: General: sleeping in bed, would not awake to tactile or verbal stimuli HENT: Nasal canula and EG tube in place, right eye swollen, craniotomy incision without discharge Lungs: decreased bs bilaterally.  Cardiovascular: regular rate and rhythm, no murmurs Abdomen: soft, non tender, no distention Extremities: warm, no peripheral edema Neuro: unresponsive on propofol. With some bilateral upper extremity twitching.   Labs and imaging personally reviewed CBGs are high LFTs wnl BMP 8/15 wnl - repeat pending today CBC 8/22 wnl - repeat pending today  Resolved Hospital Problem list     Assessment & Plan:   Acute encephalopathy Status epilepticus Impending respiratory failure Sedation required with mechanical ventilation POD 4 from Anterior skull base meningioma resection Patient has had worsening mentation following her skull base meningioma resection and with development of new acute right sided infarct and ongoing seizures. Given worsening mentation with multiple AEDs with continued seizures, she was intubated on 8/26. Currently sedated with propfol - Continue propofol sedation until seizures frequency  improves - Monitor lipids q3days while on propofol - currently on keppra, phosphenytoin, vimpat, and phenobarbital. Management per neurology - Decrease FIO2 as tolerated by O2 sats  DM2 with  Hyperglycemia Glucose remains elevated on levemir 5 units daily and SSI. Required 23 units of SSI in 24 hours. - Increase levemir to 8 units daily - continue SSI - holding metformin  History of Hypertension Normotensive with home medications on propofol.  -Continue home medications. Can stop if blood pressures drop.   Best Practice (right click and "Reselect all SmartList Selections" daily)   Diet/type: tubefeeds and NPO DVT prophylaxis: prophylactic heparin  GI prophylaxis: N/A Lines: N/A Foley:  N/A Code Status:  full code Last date of multidisciplinary goals of care discussion:  Labs   CBC: Recent Labs  Lab 05/18/21 1453 05/21/21 1548 05/21/21 1650 05/22/21 0512  WBC 11.2*  --  8.3 8.5  HGB 13.4 10.9* 11.4* 10.6*  HCT 41.9 32.0* 35.7* 32.6*  MCV 88.4  --  89.0 87.9  PLT 214  --  272 [id number]    Basic Metabolic Panel: Recent Labs  Lab 05/18/21 1453 05/20/21 1645 05/21/21 0223 05/21/21 1548 05/21/21 1650 05/22/21 0512  NA  --   --   --  142 141 140  K  --   --   --  3.2* 3.4* 3.6  CL  --   --   --   --  107 106  CO2  --   --   --   --  26 25  GLUCOSE  --   --   --   --  227* 256*  BUN  --   --   --   --  20 24*  CREATININE 0.73  --   --   --  0.74 0.76  CALCIUM  --   --   --   --  8.5* 8.0*  MG  --  1.8 1.8  --  1.8  --   PHOS  --  2.1* 3.1  --  1.5*  --    GFR: Estimated Creatinine Clearance: 69.3 mL/min (by C-G formula based on SCr of 0.76 mg/dL). Recent Labs  Lab 05/18/21 1453 05/21/21 1650 05/22/21 0512  WBC 11.2* 8.3 8.5    Liver Function Tests: Recent Labs  Lab 05/21/21 0223  AST 17  ALT 11  ALKPHOS 43  BILITOT 0.7  PROT 5.5*  ALBUMIN 2.6*   No results for input(s): LIPASE, AMYLASE in the last 168 hours. No results for input(s): AMMONIA in the last 168 hours.  ABG    Component Value Date/Time   PHART 7.487 (H) 05/21/2021 1548   PCO2ART 38.4 05/21/2021 1548   PO2ART 155 (H) 05/21/2021 1548   HCO3 29.0 (H) 05/21/2021 1548   TCO2  30 05/21/2021 1548   O2SAT 100.0 05/21/2021 1548     Coagulation Profile: No results for input(s): INR, PROTIME in the last 168 hours.  Cardiac Enzymes: No results for input(s): CKTOTAL, CKMB, CKMBINDEX, TROPONINI in the last 168 hours.  HbA1C: Hgb A1c MFr Bld  Date/Time Value Ref Range Status  05/11/2021 08:31 AM 6.6 (H) 4.8 - 5.6 % Final    Comment:    (NOTE) Pre diabetes:          5.7%-6.4%  Diabetes:              >6.4%  Glycemic control for   <7.0% adults with diabetes     CBG: Recent Labs  Lab 05/21/21 1556 05/21/21 1951  05/21/21 2357 05/22/21 0304 05/22/21 0826  GLUCAP 153* 240* 196* 217* 243*    Critical care time: The patient is critically ill with multiple organ systems failure and requires high complexity decision making for assessment and support, frequent evaluation and titration of therapies, application of advanced monitoring technologies and extensive interpretation of multiple databases.  Critical care time 39 mins. This represents my time independent of the NPs time taking care of the pt. This is excluding procedures.    Audria Nine DO [facility] 05/22/2021, 1:59 PM See Amion for pager If no response to pager, please call [phone] until 1900 After 1900 please call [facility] [phone]

## 2021-05-22 NOTE — Progress Notes (Signed)
EEG maint complete.  ?

## 2021-05-22 NOTE — Progress Notes (Signed)
PT Cancellation Note  Patient Details Name: Olivia Shah MRN: [id number] DOB: Feb 18, 1956   Cancelled Treatment:    Reason Eval/Treat Not Completed: Medical issues which prohibited therapy at this time. Pt remains intubated and sedated by medications for seizure control. RN asking PT to hold at this time, will continue to follow and progress as appropriate.   West Carbo, PT, DPT   Acute Rehabilitation Department Pager #: [phone]   Sandra Cockayne 05/22/2021, 3:15 PM

## 2021-05-22 NOTE — Progress Notes (Signed)
Nutrition Follow-up  DOCUMENTATION CODES:   Obesity unspecified  INTERVENTION:   Tube feeding via Cortrak tube: Jevity 1.2 at 55 ml/h (1320 ml per day) Prosource TF 45 ml TID   Provides 1704 kcal, 106 gm protein, 1070 ml free water daily   NUTRITION DIAGNOSIS:   Inadequate oral intake related to inability to eat as evidenced by NPO status.  Ongoing.   GOAL:   Patient will meet greater than or equal to 90% of their needs  Met with TF.   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  Pt discussed during ICU rounds and with RN. Pt lethargic and had cortrak placed. LTM EEG showed ongoing seizures requiring burst suppression and intubation.  Levemir increased due to ongoing high blood sugars  Spoke with nephew at bedside. He reports weight loss prior to surgery which was intentional but no recent unintentional weight loss. 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 She lives alone and does not use a walker or cane at home.   8/22 s/p R crani 8/24 cortrak placed; tip gastric, subclinical seizures per EEG 8/25 pt intubated due to ongoing seizures that were increasing   Patient is currently intubated on ventilator support MV: 7.9 L/min Temp (24hrs), Avg:98.6 F (37 C), Min:97.7 F (36.5 C), Max:99.4 F (37.4 C)  Propofol: 7 ml/hr provides: 184 kcal  Medications reviewed and include: colace, SSI, levemir, MVI with minerals, protonix, vitamin E  Labs reviewed:  CBG's: 196-243    NUTRITION - FOCUSED PHYSICAL EXAM:  Flowsheet Row Most Recent Value  Orbital Region No depletion  Upper Arm Region No depletion  Thoracic and Lumbar Region No depletion  Buccal Region No depletion  Temple Region No depletion  Clavicle  Bone Region No depletion  Clavicle and Acromion Bone Region No depletion  Scapular Bone Region No depletion  Dorsal Hand Unable to assess  Patellar Region No depletion  Anterior Thigh Region No depletion  Posterior Calf Region Mild depletion  Edema (RD Assessment) None  Hair Reviewed  Eyes Unable to assess  Mouth Unable to assess  Skin Reviewed  Nails Other (Comment)       Diet Order:   Diet Order     None       EDUCATION NEEDS:   Not appropriate for education at this time  Skin:  Skin Assessment: Reviewed RN Assessment  Last BM:  unknown  Height:   Ht Readings from Last 1 Encounters:  05/18/21 _0  (1.575 m)    Weight:   Wt Readings from Last 1 Encounters:  05/18/21 85.5 kg    BMI:  Body mass index is 34.48 kg/m.  Estimated Nutritional Needs:   Kcal:  1700-1900  Protein:  100-110 grams  Fluid:  >1.7 L/day  Lockie Pares., RD, LDN, CNSC See AMiON for contact information

## 2021-05-23 ENCOUNTER — Inpatient Hospital Stay: Payer: Self-pay

## 2021-05-23 DIAGNOSIS — Z4659 Encounter for fitting and adjustment of other gastrointestinal appliance and device: Secondary | ICD-10-CM | POA: Diagnosis not present

## 2021-05-23 LAB — GLUCOSE, CAPILLARY
Glucose-Capillary: 180 mg/dL — ABNORMAL HIGH (ref 70–99)
Glucose-Capillary: 180 mg/dL — ABNORMAL HIGH (ref 70–99)
Glucose-Capillary: 197 mg/dL — ABNORMAL HIGH (ref 70–99)
Glucose-Capillary: 215 mg/dL — ABNORMAL HIGH (ref 70–99)
Glucose-Capillary: 234 mg/dL — ABNORMAL HIGH (ref 70–99)
Glucose-Capillary: 251 mg/dL — ABNORMAL HIGH (ref 70–99)

## 2021-05-23 LAB — BASIC METABOLIC PANEL
Anion gap: 8 (ref 5–15)
BUN: 29 mg/dL — ABNORMAL HIGH (ref 8–23)
CO2: 25 mmol/L (ref 22–32)
Calcium: 7.9 mg/dL — ABNORMAL LOW (ref 8.9–10.3)
Chloride: 106 mmol/L (ref 98–111)
Creatinine, Ser: 0.8 mg/dL (ref 0.44–1.00)
GFR, Estimated: >60 mL/min (ref >60)
Glucose, Bld: 243 mg/dL — ABNORMAL HIGH (ref 70–99)
Potassium: 4.5 mmol/L (ref 3.5–5.1)
Sodium: 139 mmol/L (ref 135–145)

## 2021-05-23 LAB — CBC
HCT: 34.1 % — ABNORMAL LOW (ref 36.0–46.0)
Hemoglobin: 10.7 g/dL — ABNORMAL LOW (ref 12.0–15.0)
MCH: 28.5 pg (ref 26.0–34.0)
MCHC: 31.4 g/dL (ref 30.0–36.0)
MCV: 90.7 fL (ref 80.0–100.0)
Platelets: 238 10*3/uL (ref 150–400)
RBC: 3.76 MIL/uL — ABNORMAL LOW (ref 3.87–5.11)
RDW: 15.1 % (ref 11.5–15.5)
WBC: 7.8 10*3/uL (ref 4.0–10.5)
nRBC: 0 % (ref 0.0–0.2)

## 2021-05-23 MED ORDER — SODIUM CHLORIDE 0.9% FLUSH
10.0000 mL | Freq: Two times a day (BID) | INTRAVENOUS | Status: DC
  Administered 2021-05-23 – 2021-06-03 (×23): 10 mL

## 2021-05-23 MED ORDER — SODIUM CHLORIDE 0.9% FLUSH: 10.0000 mL | INTRAVENOUS | Status: DC | PRN

## 2021-05-23 NOTE — Progress Notes (Signed)
Neurosurgery Service Progress Note  Subjective: No clinical seizures overnight, no other events / issues  Objective: Vitals:   05/23/21 0700 05/23/21 0745 05/23/21 0800 05/23/21 0900  BP: 124/75 124/75 (!) 154/88 137/82  Pulse: 76 74 86 79  Resp: 16 16 (!) 25 17  Temp:   98.5 F (36.9 C)   TempSrc:   Axillary   SpO2: 100% 100% 99% 100%  Weight:      Height:        Physical Exam: Intubated and sedated, EEG with diffuse slowing  Assessment & Plan: 65 y.o. woman s/p resection of planum meningioma, recovering well. 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 Post-op course w/ GTC on POD1, started keppra 8/23, status epilepticus, 8/25 intubation 2/2 status epilepticus  -in status epilepticus, f/u CCM & neurology recs, on lacosamide 200bid, keppra 2gbid, phenobarb 65bid s/p load, phenytoin 100tid, propofol gtt, overnight EEG shows no further seizures -SCDs/TEDs, Olivia Shah  05/23/21 9:33 AM

## 2021-05-23 NOTE — Progress Notes (Signed)
Peripherally Inserted Central Catheter Placement  The IV Nurse has discussed with the patient and/or persons authorized to consent for the patient, the purpose of this procedure and the potential benefits and risks involved with this procedure.  The benefits include less needle sticks, lab draws from the catheter, and the patient may be discharged home with the catheter. Risks include, but not limited to, infection, bleeding, blood clot (thrombus formation), and puncture of an artery; nerve damage and irregular heartbeat and possibility to perform a PICC exchange if needed/ordered by physician.  Alternatives to this procedure were also discussed.  Bard Power PICC patient education guide, fact sheet on infection prevention and patient information card has been provided to patient /or left at bedside.  Consent obtained with POA at bedside   PICC Placement Documentation  PICC Double Lumen [id number] PICC Right Basilic 39 cm 0 cm (Active)  Indication for Insertion or Continuance of Line Poor Vasculature-patient has had multiple peripheral attempts or PIVs lasting less than 24 hours 05/23/21 1300  Exposed Catheter (cm) 0 cm 05/23/21 1300  Site Assessment Clean;Dry;Intact 05/23/21 1300  Lumen #1 Status Flushed;Saline locked;Blood return noted 05/23/21 1300  Lumen #2 Status Flushed;Saline locked;Blood return noted 05/23/21 1300  Dressing Type Transparent;Securing device 05/23/21 1300  Dressing Status Clean;Dry;Intact 05/23/21 1300  Antimicrobial disc in place? Yes 05/23/21 1300  Safety Lock Not Applicable [id number]  Line Care Connections checked and tightened 05/23/21 1300  Dressing Intervention New dressing 05/23/21 1300  Dressing Change Due 05/30/21 05/23/21 1300       Holley Bouche Renee 05/23/2021, 1:21 PM

## 2021-05-23 NOTE — Progress Notes (Signed)
NAME:  KD BORUCH, MRN:  [id number], DOB:  1955/10/07, LOS: 5 ADMISSION DATE:  05/18/2021, CONSULTATION DATE:  05/20/21 REFERRING MD:  Zada Finders, CHIEF COMPLAINT:  AMS, seizures   History of Present Illness:  Olivia Shah is a 65 y.o. female who has PMH including congenital right-sided blindness, HTN, DM, arthritis.  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  She was taken to the OR for right pterional craniotomy which was successful.   The following morning, she was noted to have a 30 second GTC seizure that resolved spontaneously.  She then returned to baseline; however, she was started on Keppra 1g twice daily as prophylaxis.  Unfortunately, later that morning, she had recurrent seizure lasting about 1 minute again resolving spontaneously.   EEG 8/24 AM demonstrated cortical dysfunction in the right frontotemporal region, moderate diffuse encephalopathy, no seizures or definite epileptiform discharges. EEG 8/24 PM was reviewed and showed 5 seizures arising in the right hemisphere which were subclinical.   Due to recurrent seizures, PCCM asked to see in consultation  Pertinent  Medical History  Meningioma HTN DM  Significant Hospital Events: Including procedures, antibiotic start and stop dates in addition to other pertinent events   8/22 > OR for right craniotomy. 8/23 > 2 separate seizures that resolved spontaneously. 8/24 > subclinical seizures (5 noted on EEG). 8/25 > frequency of seizures increasing despite keppra and phenytoin, started vimpat and phenobarbital, Intubated for GCS of 7.  Interim History / Subjective:  On wean from vent this am 10/5 doing ok. Mental status precludes extubation.  Objective   Blood pressure (!) 134/94, pulse 89, temperature 98.5 F (36.9 C), temperature source Axillary, resp. rate 19, height '5\' 2"'$  (1.575 m), weight 85.5 kg, SpO2 99 %.    Vent Mode: CPAP;PSV FiO2 (%):  [40  %] 40 % Set Rate:  [16 bmp] 16 bmp Vt Set:  [400 mL] 400 mL PEEP:  [5 cmH20] 5 cmH20 Pressure Support:  [10 cmH20] 10 cmH20 Plateau Pressure:  [9 cmH20-16 cmH20] 16 cmH20   Intake/Output Summary (Last 24 hours) at 05/23/2021 1244 Last data filed at 05/23/2021 1100 Gross per 24 hour  Intake 1951.81 ml  Output 500 ml  Net 1451.81 ml   Filed Weights   05/18/21 0603 05/18/21 1500  Weight: 86.2 kg 85.5 kg    Examination: General: sleeping in bed, would not awake to tactile or verbal stimuli HENT: Nasal cannula and EG tube in place, right eye swollen, craniotomy incision without discharge Lungs: decreased bs bilaterally.  Cardiovascular: regular rate and rhythm, no murmurs Abdomen: soft, non tender, no distention Extremities: warm, no peripheral edema Neuro: unresponsive on propofol.     Resolved Hospital Problem list     Assessment & Plan:   Acute encephalopathy Status epilepticus Impending respiratory failure Sedation required with mechanical ventilation POD 4 from Anterior skull base meningioma resection Patient has had worsening mentation following her skull base meningioma resection and with development of new acute right sided infarct and ongoing seizures. Given worsening mentation with multiple AEDs with continued seizures, she was intubated on 8/26. Currently sedated with propfol - Continue propofol but attempt wean today in light of no sz on eeg obvernight - currently on keppra, phosphenytoin, vimpat, and phenobarbital. Management per neurology - Decrease FIO2 as tolerated by O2 sats  DM2 with Hyperglycemia Glucose remains elevated on levemir 5 units daily and SSI. Required 23 units of SSI in 24 hours. -  Increase levemir to 8 units daily - continue SSI - holding metformin  History of Hypertension Normotensive with home medications on propofol.  -Continue home medications. Can stop if blood pressures drop.   Best Practice (right click and "Reselect all  SmartList Selections" daily)   Diet/type: tubefeeds and NPO DVT prophylaxis: prophylactic heparin  GI prophylaxis: N/A Lines: N/A Foley:  N/A Code Status:  full code Last date of multidisciplinary goals of care discussion:  Labs   CBC: Recent Labs  Lab 05/18/21 1453 05/21/21 1548 05/21/21 1650 05/22/21 0512 05/23/21 0426  WBC 11.2*  --  8.3 8.5 7.8  HGB 13.4 10.9* 11.4* 10.6* 10.7*  HCT 41.9 32.0* 35.7* 32.6* 34.1*  MCV 88.4  --  89.0 87.9 90.7  PLT 214  --  272 235 [id number]    Basic Metabolic Panel: Recent Labs  Lab 05/18/21 1453 05/20/21 1645 05/21/21 0223 05/21/21 1548 05/21/21 1650 05/22/21 0512 05/23/21 0426  NA  --   --   --  142 141 140 139  K  --   --   --  3.2* 3.4* 3.6 4.5  CL  --   --   --   --  107 106 106  CO2  --   --   --   --  '26 25 25  '$ GLUCOSE  --   --   --   --  227* 256* 243*  BUN  --   --   --   --  20 24* 29*  CREATININE 0.73  --   --   --  0.74 0.76 0.80  CALCIUM  --   --   --   --  8.5* 8.0* 7.9*  MG  --  1.8 1.8  --  1.8  --   --   PHOS  --  2.1* 3.1  --  1.5*  --   --    GFR: Estimated Creatinine Clearance: 69.3 mL/min (by C-G formula based on SCr of 0.8 mg/dL). Recent Labs  Lab 05/18/21 1453 05/21/21 1650 05/22/21 0512 05/23/21 0426  WBC 11.2* 8.3 8.5 7.8    Liver Function Tests: Recent Labs  Lab 05/21/21 0223  AST 17  ALT 11  ALKPHOS 43  BILITOT 0.7  PROT 5.5*  ALBUMIN 2.6*   No results for input(s): LIPASE, AMYLASE in the last 168 hours. No results for input(s): AMMONIA in the last 168 hours.  ABG    Component Value Date/Time   PHART 7.487 (H) 05/21/2021 1548   PCO2ART 38.4 05/21/2021 1548   PO2ART 155 (H) 05/21/2021 1548   HCO3 29.0 (H) 05/21/2021 1548   TCO2 30 05/21/2021 1548   O2SAT 100.0 05/21/2021 1548     Coagulation Profile: No results for input(s): INR, PROTIME in the last 168 hours.  Cardiac Enzymes: No results for input(s): CKTOTAL, CKMB, CKMBINDEX, TROPONINI in the last 168 hours.  HbA1C: Hgb  A1c MFr Bld  Date/Time Value Ref Range Status  05/11/2021 08:31 AM 6.6 (H) 4.8 - 5.6 % Final    Comment:    (NOTE) Pre diabetes:          5.7%-6.4%  Diabetes:              >6.4%  Glycemic control for   <7.0% adults with diabetes     CBG: Recent Labs  Lab 05/22/21 1941 05/22/21 2333 05/23/21 0416 05/23/21 0829 05/23/21 1240  GLUCAP 213* 216* 251* 215* 180*    Critical care time: The patient is critically ill  with multiple organ systems failure and requires high complexity decision making for assessment and support, frequent evaluation and titration of therapies, application of advanced monitoring technologies and extensive interpretation of multiple databases.  Critical care time 32 mins. This represents my time independent of the NPs time taking care of the pt. This is excluding procedures.    Audria Nine DO [facility] 05/23/2021, 12:44 PM See Amion for pager If no response to pager, please call [phone] until 1900 After 1900 please call [facility] [phone]

## 2021-05-23 NOTE — Progress Notes (Signed)
Neurology progress note  Major Interval events/Subjective:  Propofol has been weaned over the course of the day  ROS: Unable to obtain due to poor mental status  Examination  Vital signs in last 24 hours: Temp:  [97.7 F (36.5 C)-101.1 F (38.4 C)] 99.9 F (37.7 C) (08/27 0400) Pulse Rate:  [68-100] 76 (08/27 0700) Resp:  [15-20] 16 (08/27 0700) BP: (99-128)/(58-88) 124/75 (08/27 0700) SpO2:  [97 %-100 %] 100 % (08/27 0700) FiO2 (%):  [40 %-50 %] 40 % (08/27 0347)  General: lying in bed, NAD CVS: pulse-normal rate and rhythm RS: Comfortable on the ventilator without audible wheezing or clear dyssynchrony Extremities: normal, warm  Neuro:  Mental status: Very sleepy, does not follow commands, does not have any verbal output, does not track examiner. Cranial nerves: Right eye postsurgical, left eye sluggishly reactive, corneals intact, face symmetric within limits of ET tube cough intact per nursing, she occasionally has some movements of her face that appear to be mouthing the tube, not rhythmic and not concerning for seizure activity as they appear to be triggered by stimulation Sensory/motor: Today more quickly withdraws in all 4 extremities compared to yesterday, remains more brisk on the right than the left.   Basic Metabolic Panel: Recent Labs  Lab 05/18/21 1453 05/20/21 1645 05/21/21 0223 05/21/21 1548 05/21/21 1650 05/22/21 0512 05/23/21 0426  NA  --   --   --  142 141 140 139  K  --   --   --  3.2* 3.4* 3.6 4.5  CL  --   --   --   --  107 106 106  CO2  --   --   --   --  '26 25 25  '$ GLUCOSE  --   --   --   --  227* 256* 243*  BUN  --   --   --   --  20 24* 29*  CREATININE 0.73  --   --   --  0.74 0.76 0.80  CALCIUM  --   --   --   --  8.5* 8.0* 7.9*  MG  --  1.8 1.8  --  1.8  --   --   PHOS  --  2.1* 3.1  --  1.5*  --   --      CBC: Recent Labs  Lab 05/18/21 1453 05/21/21 1548 05/21/21 1650 05/22/21 0512 05/23/21 0426  WBC 11.2*  --  8.3 8.5 7.8   HGB 13.4 10.9* 11.4* 10.6* 10.7*  HCT 41.9 32.0* 35.7* 32.6* 34.1*  MCV 88.4  --  89.0 87.9 90.7  PLT 214  --  272 235 238    Results for PRIYAH, OTTESON (MRN [id number]) as of 05/23/2021 07:42  Ref. Range 05/22/2021 12:59 05/22/2021 21:39  PHENOBARBITAL Latest Ref Range: 15.0 - 30.0 ug/mL  21.8  Phenytoin Lvl Latest Ref Range: 10.0 - 20.0 ug/mL 13.6     Coagulation Studies: No results for input(s): LABPROT, INR in the last 72 hours.  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 No significant residual masslike enhancement.This study will serve as a new baseline future follow-up imaging. 2. Acute infarcts in the right anterior temporal lobe, inferior frontal lobe, lentiform nucleus and posterior limb of the right internal capsule with mild associated edema. 3. Moderate right greater than left antidependent pneumocephalus (up to 1.8 cm thick) and small volume right-sided extra-axial hemorrhage and/or  gelfoam subjacent to the craniotomy (up to 0.9 cm thick) withresulting 3 mm of leftward midline shift and effacement of the right lateral ventricle.            EEG  05/21/2021 1052 to 05/22/2021 [id number]:   IMPRESSION: This study was initially suggestive of focal status epilepticus arising from right frontotemporal region. Patient was intubated and propofol was started on 05/21/2021 at around 1422 after which seizure frequency improved. Last seizure was noted on 05/21/2021 at 1651.  There is also evidence of moderate to severe diffuse encephalopathy, nonspecific etiology but likely due to seizures, sedation.  Patient event button was pressed on 05/21/2021 at 2132 and 05/22/2021 at 0735 during which RN reported facial twitching without concomitant EEG change. However focal motor seizures may not be seen on scalp EEG.  Therefore clinical correlation is recommended.  EEG appears to be improving  compared to previous day.   ASSESSMENT AND PLAN: 65 year old female on the Neurosurgery service, with new onset seizures s/p right pterional craniotomy for meningioma resection which was also complicated by right hemispheric strokes.  Focal motor status is improved with intubation and addition of propofol.  Would holding current dose today and then attempting to reduce this medication tomorrow, with addition of perampanel if needed.  Focal convulsive status epilepticus, refractory, now improving. Meningioma status post right pterional craniotomy Acute right hemispheric strokes  Hypoproteinemia with hypoalbuminemia Acute encephalopathy, postictal -LTM EEG showed focal convulsive status epilepticus, now controlled   Recommendations -Already on Keppra 1500 mg twice daily, which will be continued but may need to be decreased if her renal function worsens -Loaded with fosphenytoin 8/24, currently on phenytoin 100 mg every 8 hours, continue current dose for now  -Level, trough, on 8/26 at 13:00 resulted at 13.6 -Loaded with Vimpat 400 mg 8/25, and started 200 mg twice daily, continue current dose for now -Loaded with 1 g phenobarbital 8/26, on 65 mg twice daily, continue current dose for now  -Level, trough, on 21:00 on 8/26, 21.8 -Intubated 8/26 and additional on propofol titrated to seizure suppression (15 mcg/kg/min), propofol subsequently weaned on 8/27 -If she remains seizure-free will next consider discontinuing phenobarbital - Discussed with patient's family at bedside  -Patient family is agreement with the plan.  -Updated critical care team about my plan -Management of rest of comorbidities per primary team  CRITICAL CARE Performed by: Enis Gash MD-PhD [facility] [phone]  Available 7 AM to 7 PM, outside these hours please contact Neurologist on call listed on AMION   Total critical care time: 35 minutes  Critical care time was exclusive of  separately billable procedures and treating other patients.  Critical care was necessary to treat or prevent imminent or life-threatening deterioration.  Critical care was time spent personally by me on the following activities: development of treatment plan with patient and/or surrogate as well as nursing, discussions with consultants, evaluation of patient's response to treatment, examination of patient, obtaining history from patient or surrogate, ordering and performing treatments and interventions, ordering and review of laboratory studies, ordering and review of radiographic studies, pulse oximetry and re-evaluation of patient's condition.

## 2021-05-23 NOTE — Progress Notes (Signed)
EEG maintenance complete. Skin breakdown seen at electrode site Fp2 F8. Moved both electrodes and informed nurse in room. Moved electrode FP1 due to some redness. Continue to monitor

## 2021-05-23 NOTE — Procedures (Addendum)
Patient Name: Olivia Shah  MRN: [id number]  Epilepsy Attending: Lora Havens  Referring Physician/Provider: Dr Emelda Brothers Duration: 05/22/2021 1052 to 05/23/2021 1052   Patient history: 65 y.o. woman s/p resection of planum meningioma, recovering well. 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 Post-op course w/ GTC on POD1, started keppra 8/23.  Continues to be encephalopathic.  EEG to evaluate for seizures.   Level of alertness: comatose   AEDs during EEG study: LEV, PHT, LCM, PB, Propofol   Technical aspects: This EEG study was done with scalp electrodes positioned according to the 10-20 International system of electrode placement. Electrical activity was acquired at a sampling rate of '500Hz'$  and reviewed with a high frequency filter of '70Hz'$  and a low frequency filter of '1Hz'$ . EEG data were recorded continuously and digitally stored.    Description: EEG also showed continuous generalized and maximal right frontotemporal region 3 to 6 Hz theta-delta slowing.     ABNORMALITY -Continuous slow, generalized and maximal right frontotemporal region   IMPRESSION: This study is suggestive of cortical dysfunction arising from right frontotemporal region secondary to underlying structural abnormality. There is also evidence of moderate to severe diffuse encephalopathy, nonspecific etiology but likely due to sedation. No definite seizures were seen during this study   EEG appears to be improving compared to previous day.   [place] Barbra Sarks

## 2021-05-24 ENCOUNTER — Inpatient Hospital Stay (HOSPITAL_COMMUNITY): Payer: Medicare Other

## 2021-05-24 DIAGNOSIS — Z4659 Encounter for fitting and adjustment of other gastrointestinal appliance and device: Secondary | ICD-10-CM | POA: Diagnosis not present

## 2021-05-24 DIAGNOSIS — G40009 Localization-related (focal) (partial) idiopathic epilepsy and epileptic syndromes with seizures of localized onset, not intractable, without status epilepticus: Secondary | ICD-10-CM

## 2021-05-24 LAB — CBC
HCT: 30.5 % — ABNORMAL LOW (ref 36.0–46.0)
Hemoglobin: 9.6 g/dL — ABNORMAL LOW (ref 12.0–15.0)
MCH: 27.9 pg (ref 26.0–34.0)
MCHC: 31.5 g/dL (ref 30.0–36.0)
MCV: 88.7 fL (ref 80.0–100.0)
Platelets: 246 10*3/uL (ref 150–400)
RBC: 3.44 MIL/uL — ABNORMAL LOW (ref 3.87–5.11)
RDW: 14.8 % (ref 11.5–15.5)
WBC: 10.8 10*3/uL — ABNORMAL HIGH (ref 4.0–10.5)
nRBC: 0 % (ref 0.0–0.2)

## 2021-05-24 LAB — BASIC METABOLIC PANEL
Anion gap: 7 (ref 5–15)
BUN: 21 mg/dL (ref 8–23)
CO2: 29 mmol/L (ref 22–32)
Calcium: 8.2 mg/dL — ABNORMAL LOW (ref 8.9–10.3)
Chloride: 103 mmol/L (ref 98–111)
Creatinine, Ser: 0.54 mg/dL (ref 0.44–1.00)
GFR, Estimated: >60 mL/min (ref >60)
Glucose, Bld: 139 mg/dL — ABNORMAL HIGH (ref 70–99)
Potassium: 3.9 mmol/L (ref 3.5–5.1)
Sodium: 139 mmol/L (ref 135–145)

## 2021-05-24 LAB — GLUCOSE, CAPILLARY
Glucose-Capillary: 129 mg/dL — ABNORMAL HIGH (ref 70–99)
Glucose-Capillary: 192 mg/dL — ABNORMAL HIGH (ref 70–99)
Glucose-Capillary: 207 mg/dL — ABNORMAL HIGH (ref 70–99)
Glucose-Capillary: 215 mg/dL — ABNORMAL HIGH (ref 70–99)
Glucose-Capillary: 225 mg/dL — ABNORMAL HIGH (ref 70–99)

## 2021-05-24 LAB — URINALYSIS, DIPSTICK ONLY
Bilirubin Urine: NEGATIVE
Glucose, UA: 150 mg/dL — AB
Hgb urine dipstick: NEGATIVE
Ketones, ur: NEGATIVE mg/dL
Nitrite: NEGATIVE
Protein, ur: NEGATIVE mg/dL
Specific Gravity, Urine: 1.018 (ref 1.005–1.030)
pH: 6 (ref 5.0–8.0)

## 2021-05-24 MED ORDER — INSULIN DETEMIR 100 UNIT/ML ~~LOC~~ SOLN
12.0000 [IU] | Freq: Every day | SUBCUTANEOUS | Status: DC
  Administered 2021-05-25: 12 [IU] via SUBCUTANEOUS
  Filled 2021-05-24 (×2): qty 0.12

## 2021-05-24 NOTE — Progress Notes (Signed)
NAME:  Olivia Shah, MRN:  [id number], DOB:  08/09/1956, LOS: 6 ADMISSION DATE:  05/18/2021, CONSULTATION DATE:  05/20/21 REFERRING MD:  Zada Finders, CHIEF COMPLAINT:  AMS, seizures   History of Present Illness:  Olivia Shah is a 65 y.o. female who has PMH including congenital right-sided blindness, HTN, DM, arthritis.  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  She was taken to the OR for right pterional craniotomy which was successful.   The following morning, she was noted to have a 30 second GTC seizure that resolved spontaneously.  She then returned to baseline; however, she was started on Keppra 1g twice daily as prophylaxis.  Unfortunately, later that morning, she had recurrent seizure lasting about 1 minute again resolving spontaneously.   EEG 8/24 AM demonstrated cortical dysfunction in the right frontotemporal region, moderate diffuse encephalopathy, no seizures or definite epileptiform discharges. EEG 8/24 PM was reviewed and showed 5 seizures arising in the right hemisphere which were subclinical.   Due to recurrent seizures, PCCM asked to see in consultation  Pertinent  Medical History  Meningioma HTN DM  Significant Hospital Events: Including procedures, antibiotic start and stop dates in addition to other pertinent events   8/22 > OR for right craniotomy. 8/23 > 2 separate seizures that resolved spontaneously. 8/24 > subclinical seizures (5 noted on EEG). 8/25 > frequency of seizures increasing despite keppra and phenytoin, started vimpat and phenobarbital, Intubated for GCS of 7.  Interim History / Subjective:  8/28: tmax overnight 99.3 but increasing temps today to 100.2. stopping phenobarb and monitor for sz. Cont to wean propofol as pt tolerates. Wbc is up to 10.8 today.  Objective   Blood pressure (!) 157/83, pulse 88, temperature 99.2 F (37.3 C), temperature source Axillary, resp. rate (!) 23,  height '5\' 2"'$  (1.575 m), weight 85.5 kg, SpO2 99 %.    Vent Mode: CPAP;PSV FiO2 (%):  [40 %] 40 % Set Rate:  [16 bmp] 16 bmp Vt Set:  [400 mL] 400 mL PEEP:  [5 cmH20] 5 cmH20 Pressure Support:  [10 cmH20] 10 cmH20 Plateau Pressure:  [14 cmH20] 14 cmH20   Intake/Output Summary (Last 24 hours) at 05/24/2021 1206 Last data filed at 05/24/2021 0800 Gross per 24 hour  Intake 1672.12 ml  Output 700 ml  Net 972.12 ml   Filed Weights   05/18/21 0603 05/18/21 1500  Weight: 86.2 kg 85.5 kg    Examination: General: sleeping in bed, would not awake to tactile or verbal stimuli HENT: ett and og tube in place, eeg in place, right eye swollen, craniotomy incision without discharge Lungs: decreased bs bilaterally.  Cardiovascular: regular rate and rhythm, no murmurs Abdomen: soft, non tender, no distention Extremities: warm, no peripheral edema Neuro: unresponsive on low dose propofol.     Resolved Hospital Problem list     Assessment & Plan:   Acute encephalopathy Status epilepticus Impending respiratory failure Sedation required with mechanical ventilation POD 4 from Anterior skull base meningioma resection Patient has had worsening mentation following her skull base meningioma resection and with development of new acute right sided infarct and ongoing seizures. Given worsening mentation with multiple AEDs with continued seizures, she was intubated on 8/26. Currently sedated with propfol - Continue propofol but attempt wean today in light of no sz on eeg obvernight - currently on keppra, phosphenytoin, vimpat. Stopping phenobarb DM2 with Hyperglycemia - Increase levemir to 12 units daily - continue SSI -  holding metformin  History of Hypertension Normotensive with home medications on propofol.  -Continue home medications. Can stop if blood pressures drop.   Best Practice (right click and "Reselect all SmartList Selections" daily)   Diet/type: tubefeeds DVT prophylaxis:  prophylactic heparin  GI prophylaxis: PPI Lines: yes and it is still needed picc R arm placed 8/27 (only piv prior) Foley:  N/A Code Status:  full code Last date of multidisciplinary goals of care discussion: per primary  Labs   CBC: Recent Labs  Lab 05/18/21 1453 05/21/21 1548 05/21/21 1650 05/22/21 0512 05/23/21 0426 05/24/21 0537  WBC 11.2*  --  8.3 8.5 7.8 10.8*  HGB 13.4 10.9* 11.4* 10.6* 10.7* 9.6*  HCT 41.9 32.0* 35.7* 32.6* 34.1* 30.5*  MCV 88.4  --  89.0 87.9 90.7 88.7  PLT 214  --  272 235 238 0000000    Basic Metabolic Panel: Recent Labs  Lab 05/18/21 1453 05/20/21 1645 05/21/21 0223 05/21/21 1548 05/21/21 1650 05/22/21 0512 05/23/21 0426 05/24/21 0537  NA  --   --   --  142 141 140 139 139  K  --   --   --  3.2* 3.4* 3.6 4.5 3.9  CL  --   --   --   --  107 106 106 103  CO2  --   --   --   --  '26 25 25 29  '$ GLUCOSE  --   --   --   --  227* 256* 243* 139*  BUN  --   --   --   --  20 24* 29* 21  CREATININE 0.73  --   --   --  0.74 0.76 0.80 0.54  CALCIUM  --   --   --   --  8.5* 8.0* 7.9* 8.2*  MG  --  1.8 1.8  --  1.8  --   --   --   PHOS  --  2.1* 3.1  --  1.5*  --   --   --    GFR: Estimated Creatinine Clearance: 69.3 mL/min (by C-G formula based on SCr of 0.54 mg/dL). Recent Labs  Lab 05/21/21 1650 05/22/21 0512 05/23/21 0426 05/24/21 0537  WBC 8.3 8.5 7.8 10.8*    Liver Function Tests: Recent Labs  Lab 05/21/21 0223  AST 17  ALT 11  ALKPHOS 43  BILITOT 0.7  PROT 5.5*  ALBUMIN 2.6*   No results for input(s): LIPASE, AMYLASE in the last 168 hours. No results for input(s): AMMONIA in the last 168 hours.  ABG    Component Value Date/Time   PHART 7.487 (H) 05/21/2021 1548   PCO2ART 38.4 05/21/2021 1548   PO2ART 155 (H) 05/21/2021 1548   HCO3 29.0 (H) 05/21/2021 1548   TCO2 30 05/21/2021 1548   O2SAT 100.0 05/21/2021 1548     Coagulation Profile: No results for input(s): INR, PROTIME in the last 168 hours.  Cardiac Enzymes: No  results for input(s): CKTOTAL, CKMB, CKMBINDEX, TROPONINI in the last 168 hours.  HbA1C: Hgb A1c MFr Bld  Date/Time Value Ref Range Status  05/11/2021 08:31 AM 6.6 (H) 4.8 - 5.6 % Final    Comment:    (NOTE) Pre diabetes:          5.7%-6.4%  Diabetes:              >6.4%  Glycemic control for   <7.0% adults with diabetes     CBG: Recent Labs  Lab 05/23/21 1618  05/23/21 1938 05/23/21 2354 05/24/21 0756 05/24/21 1143  GLUCAP 197* 180* 234* 129* 207*    Critical care time: The patient is critically ill with multiple organ systems failure and requires high complexity decision making for assessment and support, frequent evaluation and titration of therapies, application of advanced monitoring technologies and extensive interpretation of multiple databases.  Critical care time 35 mins. This represents my time independent of the NPs time taking care of the pt. This is excluding procedures.    Audria Nine DO [facility] 05/24/2021, 12:06 PM See Amion for pager If no response to pager, please call [phone] until 1900 After 1900 please call [facility] [phone]

## 2021-05-24 NOTE — Progress Notes (Signed)
EEG maintenance complete. No skin breakdown at FP1 FP2 F8 A1 F4 continue to monitor

## 2021-05-24 NOTE — Progress Notes (Addendum)
Neurology progress note  Major Interval events/Subjective:  Propofol has been weaned over the course of the day  ROS: Unable to obtain due to poor mental status  Examination  Vital signs in last 24 hours: Temp:  [98.1 F (36.7 C)-99.3 F (37.4 C)] 98.7 F (37.1 C) (08/28 0000) Pulse Rate:  [72-97] 74 (08/28 0700) Resp:  [16-25] 16 (08/28 0700) BP: (120-154)/(65-108) 152/81 (08/28 0700) SpO2:  [97 %-100 %] 100 % (08/28 0700) FiO2 (%):  [40 %] 40 % (08/28 0358)  General: lying in bed, NAD CVS: pulse-normal rate and rhythm RS: Comfortable on the ventilator without audible wheezing or clear dyssynchrony Extremities: normal, warm  Neuro:  Mental status: Very sleepy, does not follow commands, does not have any verbal output, does not track examiner. Cranial nerves: Right eye postsurgical, left eye sluggishly reactive, corneals intact, face symmetric within limits of ET tube cough intact per nursing, she occasionally has some movements of her face that appear to be mouthing the tube, not rhythmic and not concerning for seizure activity as they appear to be triggered by stimulation Sensory/motor: Withdraws in all 4 extremities, generally more brisk on the right than the left, though today the relative difference is fluctuating a bit   Basic Metabolic Panel: Recent Labs  Lab 05/18/21 1453 05/20/21 1645 05/21/21 0223 05/21/21 1548 05/21/21 1650 05/21/21 1650 05/22/21 0512 05/23/21 0426 05/24/21 0537  NA  --   --   --  142 141  --  140 139 139  K  --   --   --  3.2* 3.4*  --  3.6 4.5 3.9  CL  --   --   --   --  107  --  106 106 103  CO2  --   --   --   --  26  --  '25 25 29  '$ GLUCOSE  --   --   --   --  227*  --  256* 243* 139*  BUN  --   --   --   --  20  --  24* 29* 21  CREATININE 0.73  --   --   --  0.74  --  0.76 0.80 0.54  CALCIUM  --   --   --   --  8.5*   < > 8.0* 7.9* 8.2*  MG  --  1.8 1.8  --  1.8  --   --   --   --   PHOS  --  2.1* 3.1  --  1.5*  --   --   --   --     < > = values in this interval not displayed.     CBC: Recent Labs  Lab 05/18/21 1453 05/21/21 1548 05/21/21 1650 05/22/21 0512 05/23/21 0426 05/24/21 0537  WBC 11.2*  --  8.3 8.5 7.8 10.8*  HGB 13.4 10.9* 11.4* 10.6* 10.7* 9.6*  HCT 41.9 32.0* 35.7* 32.6* 34.1* 30.5*  MCV 88.4  --  89.0 87.9 90.7 88.7  PLT 214  --  272 235 238 246    Results for Olivia Shah, Olivia Shah (MRN [id number]) as of 05/23/2021 07:42  Ref. Range 05/22/2021 12:59 05/22/2021 21:39  PHENOBARBITAL Latest Ref Range: 15.0 - 30.0 ug/mL  21.8  Phenytoin Lvl Latest Ref Range: 10.0 - 20.0 ug/mL 13.6     Coagulation Studies: No results for input(s): LABPROT, INR in the last 72 hours.  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 No significant residual masslike enhancement.This study will serve as a new baseline future follow-up imaging. 2. Acute infarcts in the right anterior temporal lobe, inferior frontal lobe, lentiform nucleus and posterior limb of the right internal capsule with mild associated edema. 3. Moderate right greater than left antidependent pneumocephalus (up to 1.8 cm thick) and small volume right-sided extra-axial hemorrhage and/or gelfoam subjacent to the craniotomy (up to 0.9 cm thick) withresulting 3 mm of leftward midline shift and effacement of the right lateral ventricle.            EEG    05/21/2021 1052 to 05/22/2021 0924:  This study was initially suggestive of focal status epilepticus arising from right frontotemporal region. Patient was intubated and propofol was started on 05/21/2021 at around 1422 after which seizure frequency improved. Last seizure was noted on 05/21/2021 at 1651.  There is also evidence of moderate to severe diffuse encephalopathy, nonspecific etiology but likely due to seizures, sedation.  Patient event button was pressed on 05/21/2021 at 2132 and 05/22/2021 at 0735  during which RN reported facial twitching without concomitant EEG change. However focal motor seizures may not be seen on scalp EEG.  Therefore clinical correlation is recommended.  EEG appears to be improving compared to previous day.  05/23/2021 1052 to 05/24/2021 0845 This study is suggestive of independent non-specific cortical dysfunction in left hemisphere as well as right frontotemporal region. There is also moderate to severe diffuse encephalopathy, nonspecific etiology. No seizures were seen during this study  ASSESSMENT AND PLAN: 65 year old female on the Neurosurgery service, with new onset seizures s/p right pterional craniotomy for meningioma resection which was also complicated by right hemispheric strokes.  Focal motor status is improved with intubation and addition of propofol, susbequently successfully weaned 8/27. Now will wean phenobarb  Focal convulsive status epilepticus, refractory, now improving. Meningioma status post right pterional craniotomy Acute right hemispheric strokes  Hypoproteinemia with hypoalbuminemia Acute encephalopathy, postictal -LTM EEG showed focal convulsive status epilepticus, now controlled   Recommendations -Already on Keppra 1500 mg twice daily, which will be continued but may need to be decreased if her renal function worsens -Loaded with fosphenytoin 8/24, currently on phenytoin 100 mg every 8 hours, continue current dose for now  -Level, trough, on 8/26 at 13:00 resulted at 13.6 -Loaded with Vimpat 400 mg 8/25, and started 200 mg twice daily, continue current dose for now -Loaded with 1 g phenobarbital 8/26, on 65 mg twice daily, continue current dose for now  -Level, trough, on 21:00 on 8/26, 21.8 -Intubated 8/26 and additional on propofol titrated to seizure suppression (15 mcg/kg/min), propofol subsequently weaned on 8/27 - D/c phenobarbital today - Discussed with patient's family at bedside  -Patient family is agreement with the plan.   -Updated critical care team about my plan -Management of rest of comorbidities per primary team  It does appear that she has a worsening leukocytosis and a low-grade fever this morning at 100.2.  We will reach out to [facility] team for further work-up and management, note that infection can lower seizure threshold  CRITICAL CARE Performed by: Enis Gash MD-PhD [facility] [phone]  Available 7 AM to 7 PM, outside these hours please contact Neurologist on call listed on AMION   Total critical care time: 35 minutes  Critical care time was exclusive of separately billable procedures and treating other patients.  Critical care was necessary to treat or prevent imminent or life-threatening  deterioration.  Critical care was time spent personally by me on the following activities: development of treatment plan with patient and/or surrogate as well as nursing, discussions with consultants, evaluation of patient's response to treatment, examination of patient, obtaining history from patient or surrogate, ordering and performing treatments and interventions, ordering and review of laboratory studies, ordering and review of radiographic studies, pulse oximetry and re-evaluation of patient's condition.

## 2021-05-24 NOTE — Progress Notes (Signed)
PT Cancellation Note  Patient Details Name: Olivia Shah MRN: [id number] DOB: [birth year]   Cancelled Treatment:    Reason Eval/Treat Not Completed: Medical issues which prohibited therapy Pt still intubated; unresponsive per RN despite no sedation.  Wyona Almas, PT, DPT Acute Rehabilitation Services Pager [phone] Office [place] 05/24/2021, 2:12 PM

## 2021-05-24 NOTE — Progress Notes (Signed)
Inpatient Rehabilitation Admissions Coordinator   We will sign off at this time.  Danne Baxter, RN, MSN Rehab Admissions Coordinator [phone] 05/24/2021 7:17 PM

## 2021-05-24 NOTE — Procedures (Addendum)
Patient Name: Olivia Shah  MRN: [id number]  Epilepsy Attending: Lora Havens  Referring Physician/Provider: Dr Emelda Brothers Duration: 05/23/2021 1052 to 05/24/2021 1052   Patient history: 65 y.o. woman s/p resection of planum meningioma, recovering well. 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 Post-op course w/ GTC on POD1, started keppra 8/23.  Continues to be encephalopathic.  EEG to evaluate for seizures.   Level of alertness: awake, asleep   AEDs during EEG study: LEV, PHT, LCM, Phenobarb   Technical aspects: This EEG study was done with scalp electrodes positioned according to the 10-20 International system of electrode placement. Electrical activity was acquired at a sampling rate of '500Hz'$  and reviewed with a high frequency filter of '70Hz'$  and a low frequency filter of '1Hz'$ . EEG data were recorded continuously and digitally stored.    Description: No posterior dominant rhythm was seen. Sleep was characterized by sleep spindles (12-'14hz'$ ), maximal frontocentral region. EEG also showed continuous generalized 5-'9Hz'$  theta-alpha activity. There is also intermittent 2-3 Hz high amplitude delta slowing in left hemisphere as well as low amplitude 2-'3hz'$  delta slowign in right frontotemporal region     ABNORMALITY - Continuous slow, generalized  - Intermittent delta slow, left hemisphere - Intermittent slow, right frontotemporal region   IMPRESSION: This study is suggestive of independent non-specific cortical dysfunction in left hemisphere as well as right frontotemporal region. There is also moderate to severe diffuse encephalopathy, nonspecific etiology. No seizures were seen during this study   [place]

## 2021-05-24 NOTE — Progress Notes (Signed)
Patient ID: Olivia Shah, female   DOB: 1955-10-09, 65 y.o.   MRN: [id number] BP (!) 157/83   Pulse 88   Temp 99.2 F (37.3 C) (Axillary)   Resp (!) 23   Ht '5\' 2"'$  (1.575 m)   Wt 85.5 kg   SpO2 99%   BMI 34.48 kg/m  Not responsive, not following commands Will withdraw Remains connected to EEG  No changes since yesterday

## 2021-05-25 DIAGNOSIS — G40901 Epilepsy, unspecified, not intractable, with status epilepticus: Secondary | ICD-10-CM | POA: Diagnosis not present

## 2021-05-25 DIAGNOSIS — J9601 Acute respiratory failure with hypoxia: Secondary | ICD-10-CM

## 2021-05-25 DIAGNOSIS — D496 Neoplasm of unspecified behavior of brain: Secondary | ICD-10-CM | POA: Diagnosis not present

## 2021-05-25 LAB — BASIC METABOLIC PANEL
Anion gap: 8 (ref 5–15)
BUN: 21 mg/dL (ref 8–23)
CO2: 28 mmol/L (ref 22–32)
Calcium: 8.4 mg/dL — ABNORMAL LOW (ref 8.9–10.3)
Chloride: 100 mmol/L (ref 98–111)
Creatinine, Ser: 0.56 mg/dL (ref 0.44–1.00)
GFR, Estimated: >60 mL/min (ref >60)
Glucose, Bld: 175 mg/dL — ABNORMAL HIGH (ref 70–99)
Potassium: 4 mmol/L (ref 3.5–5.1)
Sodium: 136 mmol/L (ref 135–145)

## 2021-05-25 LAB — GLUCOSE, CAPILLARY
Glucose-Capillary: 206 mg/dL — ABNORMAL HIGH (ref 70–99)
Glucose-Capillary: 220 mg/dL — ABNORMAL HIGH (ref 70–99)
Glucose-Capillary: 210 mg/dL — ABNORMAL HIGH (ref 70–99)
Glucose-Capillary: 182 mg/dL — ABNORMAL HIGH (ref 70–99)
Glucose-Capillary: 252 mg/dL — ABNORMAL HIGH (ref 70–99)

## 2021-05-25 LAB — CBC
HCT: 28.3 % — ABNORMAL LOW (ref 36.0–46.0)
Hemoglobin: 9.4 g/dL — ABNORMAL LOW (ref 12.0–15.0)
MCH: 28.3 pg (ref 26.0–34.0)
MCHC: 33.2 g/dL (ref 30.0–36.0)
MCV: 85.2 fL (ref 80.0–100.0)
Platelets: 244 10*3/uL (ref 150–400)
RBC: 3.32 MIL/uL — ABNORMAL LOW (ref 3.87–5.11)
RDW: 14.6 % (ref 11.5–15.5)
WBC: 11.9 10*3/uL — ABNORMAL HIGH (ref 4.0–10.5)
nRBC: 0 % (ref 0.0–0.2)

## 2021-05-25 LAB — TRIGLYCERIDES: Triglycerides: 62 mg/dL (ref <150)

## 2021-05-25 NOTE — Progress Notes (Signed)
PT Cancellation Note  Patient Details Name: Olivia Shah MRN: [id number] DOB: [birth year]   Cancelled Treatment:    Reason Eval/Treat Not Completed: Patient not medically ready Potential extubation today.  Wyona Almas, PT, DPT Acute Rehabilitation Services Pager [phone] Office [phone]    Deno Etienne 05/25/2021, 12:50 PM

## 2021-05-25 NOTE — Progress Notes (Signed)
Neurology Attending f/u visit attempted but patient was off the floor. Will re-examine tomorrow. Per Dr. Hortense Ramal, no electrographic seizures on LTM overnight.  Su Monks, MD [facility] [phone]  If 7pm- 7am, please page neurology on call as listed in [place].

## 2021-05-25 NOTE — Procedures (Addendum)
Patient Name: Olivia Shah  MRN: [id number]  Epilepsy Attending: Lora Havens  Referring Physician/Provider: Dr Emelda Brothers Duration: 05/24/2021 1052 to 05/25/2021 1052   Patient history: 65 y.o. woman s/p resection of planum meningioma, recovering well. 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 Post-op course w/ GTC on POD1, started keppra 8/23.  Continues to be encephalopathic.  EEG to evaluate for seizures.   Level of alertness: awake, asleep   AEDs during EEG study: LEV, PHT, LCM   Technical aspects: This EEG study was done with scalp electrodes positioned according to the 10-20 International system of electrode placement. Electrical activity was acquired at a sampling rate of '500Hz'$  and reviewed with a high frequency filter of '70Hz'$  and a low frequency filter of '1Hz'$ . EEG data were recorded continuously and digitally stored.    Description: No posterior dominant rhythm was seen. Sleep was characterized by sleep spindles (12-'14hz'$ ), maximal frontocentral region. EEG also showed continuous generalized 5-'9Hz'$  theta-alpha activity. There is also intermittent 2-3 Hz high amplitude delta slowing in left hemisphere as well as continuous low amplitude 2-'3hz'$  delta slowign in right frontotemporal region     ABNORMALITY - Continuous slow, generalized and maximal right frontotemporal region - Intermittent delta slow, left hemisphere   IMPRESSION: This study is suggestive of cortical dysfunction in right frontotemporal region due to underlying structural abnormality. There is also non-specific cortical dysfunction in left temporal region. Additionally, there is moderate diffuse encephalopathy, nonspecific etiology. No seizures were seen during this study   [place]

## 2021-05-25 NOTE — Progress Notes (Signed)
LTM maintenance completed; no skin breakdown was seen under any frontal leads; event button tested.

## 2021-05-25 NOTE — Progress Notes (Signed)
LTM maint complete - no skin breakdown under: Fp2 F4 A2 F8 [facility] monitored, Event button test confirmed by [facility].

## 2021-05-25 NOTE — Progress Notes (Signed)
NAME:  Olivia Shah, MRN:  [id number], DOB:  08-Mar-1956, LOS: 7 ADMISSION DATE:  05/18/2021, CONSULTATION DATE:  05/20/21 REFERRING MD:  Zada Finders, CHIEF COMPLAINT:  AMS, seizures   History of Present Illness:  Olivia Shah is a 65 y.o. female who has PMH including congenital right-sided blindness, HTN, DM, arthritis.  She was admitted to [facility] on 05/18/21 for elective surgical resection of recently found skull base meningioma.  She was taken to the OR for right pterional craniotomy which was successful.   The following morning, she was noted to have a 30 second GTC seizure that resolved spontaneously.  She then returned to baseline; however, she was started on Keppra 1g twice daily as prophylaxis.  Unfortunately, later that morning, she had recurrent seizure lasting about 1 minute again resolving spontaneously.   EEG 8/24 AM demonstrated cortical dysfunction in the right frontotemporal region, moderate diffuse encephalopathy, no seizures or definite epileptiform discharges. EEG 8/24 PM was reviewed and showed 5 seizures arising in the right hemisphere which were subclinical.   Due to recurrent seizures, PCCM asked to see in consultation  Pertinent  Medical History  Meningioma HTN DM  Significant Hospital Events: Including procedures, antibiotic start and stop dates in addition to other pertinent events   8/22 > OR for right craniotomy. 8/23 > 2 separate seizures that resolved spontaneously. 8/24 > subclinical seizures (5 noted on EEG). 8/25 > frequency of seizures increasing despite keppra and phenytoin, started vimpat and phenobarbital, Intubated for GCS of 7.  Interim History / Subjective:  Propofol and phenobarbital were stopped yesterday, patient is waking up, now tolerating pressure support trial. remained afebrile  Objective   Blood pressure 131/67, pulse 83, temperature 98.8 F (37.1 C), temperature source Axillary, resp. rate (!) 22, height '5\' 2"'$  (1.575 m), weight 85.5 kg,  SpO2 99 %.    Vent Mode: CPAP;PSV FiO2 (%):  [40 %] 40 % Set Rate:  [16 bmp] 16 bmp Vt Set:  [400 mL] 400 mL PEEP:  [5 cmH20] 5 cmH20 Pressure Support:  [8 cmH20-10 cmH20] 8 cmH20 Plateau Pressure:  [13 cmH20-15 cmH20] 13 cmH20   Intake/Output Summary (Last 24 hours) at 05/25/2021 0825 Last data filed at 05/25/2021 0600 Gross per 24 hour  Intake 1525.05 ml  Output 1052 ml  Net 473.05 ml   Filed Weights   05/18/21 0603 05/18/21 1500  Weight: 86.2 kg 85.5 kg    Examination:   Physical exam: General: Crtitically ill-appearing elderly African-American female, orally intubated HEENT: [place]/AT, eyes anicteric.  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 Propofol and phenobarbital were stopped yesterday Patient is started waking up She is withdrawing in bilateral lower extremity with localizing on right upper extremity Avoid sedation with RASS goal 0/-1 Continue phenytoin, Vimpat and Keppra Appreciate neurology input Patient is tolerating pressure support trial, will try to extubate her today after EEG read  DM2 with Hyperglycemia Continue Levemir to 12 units daily with sliding scale insulin Holding metformin Monitor fingerstick with goal 140-180  Hypertension Blood pressure remained  stable Continue home meds  Best Practice (right click and "Reselect all SmartList Selections" daily)   Diet/type: tubefeeds DVT prophylaxis: prophylactic heparin   GI prophylaxis: PPI Lines: yes and it is still needed picc R arm placed 8/27 Foley:  N/A Code Status:  full code Last date of multidisciplinary goals of care discussion: per primary  Labs   CBC: Recent Labs  Lab 05/21/21 1650 05/22/21 0512 05/23/21 0426 05/24/21 0537 05/25/21 0516  WBC 8.3 8.5 7.8 10.8* 11.9*  HGB 11.4* 10.6* 10.7* 9.6* 9.4*  HCT 35.7* 32.6* 34.1* 30.5* 28.3*  MCV 89.0 87.9 90.7 88.7 85.2  PLT 272 235 238 246 [id number]    Basic Metabolic Panel: Recent Labs  Lab 05/20/21 1645 05/21/21 0223 05/21/21 1548 05/21/21 1650 05/22/21 0512 05/23/21 0426 05/24/21 0537 05/25/21 0516  NA  --   --    < > 141 140 139 139 136  K  --   --    < > 3.4* 3.6 4.5 3.9 4.0  CL  --   --   --  107 106 106 103 100  CO2  --   --   --  '26 25 25 29 28  '$ GLUCOSE  --   --   --  227* 256* 243* 139* 175*  BUN  --   --   --  20 24* 29* 21 21  CREATININE  --   --   --  0.74 0.76 0.80 0.54 0.56  CALCIUM  --   --   --  8.5* 8.0* 7.9* 8.2* 8.4*  MG 1.8 1.8  --  1.8  --   --   --   --   PHOS 2.1* 3.1  --  1.5*  --   --   --   --    < > = values in this interval not displayed.   GFR: Estimated Creatinine Clearance: 69.3 mL/min (by C-G formula based on SCr of 0.56 mg/dL). Recent Labs  Lab 05/22/21 0512 05/23/21 0426 05/24/21 0537 05/25/21 0516  WBC 8.5 7.8 10.8* 11.9*    Liver Function Tests: Recent Labs  Lab 05/21/21 0223  AST 17  ALT 11  ALKPHOS 43  BILITOT 0.7  PROT 5.5*  ALBUMIN 2.6*   No results for input(s): LIPASE, AMYLASE in the last 168 hours. No results for input(s): AMMONIA in the last 168 hours.  ABG    Component Value Date/Time   PHART 7.487 (H) 05/21/2021 1548   PCO2ART 38.4 05/21/2021 1548   PO2ART 155 (H) 05/21/2021 1548   HCO3 29.0 (H) 05/21/2021 1548   TCO2 30 05/21/2021 1548   O2SAT 100.0 05/21/2021 1548     Coagulation Profile: No results for input(s): INR, PROTIME in the last 168 hours.  Cardiac Enzymes: No results for input(s): CKTOTAL,  CKMB, CKMBINDEX, TROPONINI in the last 168 hours.  HbA1C: Hgb A1c MFr Bld  Date/Time Value Ref Range Status  05/11/2021 08:31 AM 6.6 (H) 4.8 - 5.6 % Final    Comment:    (NOTE) Pre diabetes:          5.7%-6.4%  Diabetes:              >6.4%  Glycemic control for   <7.0% adults with diabetes     CBG: Recent Labs  Lab 05/24/21 1143 05/24/21 1543 05/24/21 2031 05/24/21 2338 05/25/21 0358  GLUCAP 207* 192* 215* 225* 206*    Total critical care time: 43 minutes  Performed by: [place]  care time was exclusive of separately billable procedures and treating other patients.   Critical care was necessary to treat or prevent imminent or life-threatening deterioration.   Critical care was time spent personally by me on the following activities: development of treatment plan with patient and/or surrogate as well as nursing, discussions with consultants, evaluation of patient's response to treatment, examination of patient, obtaining history from patient or surrogate, ordering and performing treatments and interventions, ordering and review of laboratory studies, ordering and review of radiographic studies, pulse oximetry and re-evaluation of patient's condition.   Jacky Kindle MD [place] [facility] Critical [facility] See Amion for pager If no response to pager, please call [phone] until 7pm After 7pm, Please call E-link [phone]

## 2021-05-25 NOTE — Progress Notes (Signed)
Neurosurgery Service Progress Note  Subjective: No clinical seizures overnight, no other events / issues  Objective: Vitals:   05/25/21 1530 05/25/21 1600 05/25/21 1700 05/25/21 1708  BP:  1'22/88 99/61 99/61 '$  Pulse:  95 83 83  Resp:  (!) 25 (!) 24 (!) 23  Temp: (!) (P) 101.2 F (38.4 C)  98.8 F (37.1 C)   TempSrc: Axillary  Axillary   SpO2:  98% 97% 98%  Weight:      Height:        Physical Exam: Intubated and sedated, EEG with diffuse slowing, raising eyebrows but not opening eyes to painful stim, w/d x4, PERRL  Assessment & Plan: 65 y.o. woman s/p resection of planum meningioma, recovering well. 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 Post-op course w/ GTC on POD1, started keppra 8/23, status epilepticus, 8/25 intubation 2/2 status epilepticus  -improving, no sz, sedation lifting -SCDs/TEDs, Jackey Loge  05/25/21 5:39 PM

## 2021-05-26 ENCOUNTER — Inpatient Hospital Stay (HOSPITAL_COMMUNITY): Payer: Medicare Other

## 2021-05-26 DIAGNOSIS — G40901 Epilepsy, unspecified, not intractable, with status epilepticus: Secondary | ICD-10-CM | POA: Diagnosis not present

## 2021-05-26 DIAGNOSIS — J9601 Acute respiratory failure with hypoxia: Secondary | ICD-10-CM | POA: Diagnosis not present

## 2021-05-26 DIAGNOSIS — D496 Neoplasm of unspecified behavior of brain: Secondary | ICD-10-CM | POA: Diagnosis not present

## 2021-05-26 LAB — GLUCOSE, CAPILLARY
Glucose-Capillary: 130 mg/dL — ABNORMAL HIGH (ref 70–99)
Glucose-Capillary: 138 mg/dL — ABNORMAL HIGH (ref 70–99)
Glucose-Capillary: 200 mg/dL — ABNORMAL HIGH (ref 70–99)
Glucose-Capillary: 242 mg/dL — ABNORMAL HIGH (ref 70–99)
Glucose-Capillary: 242 mg/dL — ABNORMAL HIGH (ref 70–99)
Glucose-Capillary: 245 mg/dL — ABNORMAL HIGH (ref 70–99)
Glucose-Capillary: 257 mg/dL — ABNORMAL HIGH (ref 70–99)

## 2021-05-26 MED ORDER — DEXMEDETOMIDINE HCL IN NACL 400 MCG/100ML IV SOLN
0.4000 ug/kg/h | INTRAVENOUS | Status: DC
  Administered 2021-05-26: 0.4 ug/kg/h via INTRAVENOUS

## 2021-05-26 MED ORDER — ROCURONIUM BROMIDE 50 MG/5ML IV SOLN
100.0000 mg | Freq: Once | INTRAVENOUS | Status: AC
  Filled 2021-05-26: qty 10

## 2021-05-26 MED ORDER — ETOMIDATE 2 MG/ML IV SOLN
20.0000 mg | Freq: Once | INTRAVENOUS | Status: AC
  Administered 2021-05-26: 20 mg via INTRAVENOUS

## 2021-05-26 MED ORDER — RACEPINEPHRINE HCL 2.25 % IN NEBU
INHALATION_SOLUTION | RESPIRATORY_TRACT | Status: AC
  Administered 2021-05-26: 0.5 mL
  Filled 2021-05-26: qty 0.5

## 2021-05-26 MED ORDER — ETOMIDATE 2 MG/ML IV SOLN
INTRAVENOUS | Status: AC
  Filled 2021-05-26: qty 10

## 2021-05-26 MED ORDER — AMPICILLIN-SULBACTAM SODIUM 3 (2-1) G IJ SOLR
3.0000 g | Freq: Four times a day (QID) | INTRAMUSCULAR | Status: AC
  Administered 2021-05-26 – 2021-05-30 (×17): 3 g via INTRAVENOUS
  Filled 2021-05-26 (×17): qty 8

## 2021-05-26 MED ORDER — NOREPINEPHRINE 4 MG/250ML-% IV SOLN: 0.0000 ug/min | INTRAVENOUS | Status: DC

## 2021-05-26 MED ORDER — INSULIN DETEMIR 100 UNIT/ML ~~LOC~~ SOLN
8.0000 [IU] | Freq: Two times a day (BID) | SUBCUTANEOUS | Status: DC
  Administered 2021-05-26 (×2): 8 [IU] via SUBCUTANEOUS
  Filled 2021-05-26 (×4): qty 0.08

## 2021-05-26 MED ORDER — RACEPINEPHRINE HCL 2.25 % IN NEBU: 0.5000 mL | INHALATION_SOLUTION | Freq: Once | RESPIRATORY_TRACT | Status: DC

## 2021-05-26 MED ORDER — FUROSEMIDE 10 MG/ML IJ SOLN
40.0000 mg | Freq: Once | INTRAMUSCULAR | Status: AC
  Administered 2021-05-26: 40 mg via INTRAVENOUS
  Filled 2021-05-26: qty 4

## 2021-05-26 MED ORDER — NOREPINEPHRINE 4 MG/250ML-% IV SOLN
INTRAVENOUS | Status: AC
  Filled 2021-05-26: qty 250

## 2021-05-26 MED ORDER — DEXAMETHASONE SODIUM PHOSPHATE 4 MG/ML IJ SOLN
4.0000 mg | Freq: Four times a day (QID) | INTRAMUSCULAR | Status: AC
  Administered 2021-05-26 – 2021-05-27 (×4): 4 mg via INTRAVENOUS
  Filled 2021-05-26 (×4): qty 1

## 2021-05-26 MED ORDER — ROCURONIUM BROMIDE 10 MG/ML (PF) SYRINGE
PREFILLED_SYRINGE | INTRAVENOUS | Status: AC
  Administered 2021-05-26: 100 mg
  Filled 2021-05-26: qty 10

## 2021-05-26 NOTE — Progress Notes (Signed)
Pharmacy Antibiotic Note  Olivia Shah is a 65 y.o. female admitted on 05/18/2021 admitted to ICU s/p resection of meningioma, remains intubated d/t sz and concern for aspiration pna.  Pharmacy has been consulted for Unasyn dosing.  Plan: Unasyn 3g IV every 6 hours Monitor renal function, clinical progression and LOT  Height: '5\' 2"'$  (157.5 cm) Weight: 88.7 kg (195 lb 8.8 oz) IBW/kg (Calculated) : 50.1  Temp (24hrs), Avg:99.8 F (37.7 C), Min:98.8 F (37.1 C), Max:101.2 F (38.4 C)  Recent Labs  Lab 05/21/21 1650 05/22/21 0512 05/23/21 0426 05/24/21 0537 05/25/21 0516  WBC 8.3 8.5 7.8 10.8* 11.9*  CREATININE 0.74 0.76 0.80 0.54 0.56    Estimated Creatinine Clearance: 70.6 mL/min (by C-G formula based on SCr of 0.56 mg/dL).    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

## 2021-05-26 NOTE — Procedures (Signed)
Intubation Procedure Note  ODELL FASCHING  [id number]  21-Mar-1956  Date:05/26/21  Time:11:11 AM   Provider Performing:Chenoah Mcnally    Procedure: Intubation (44171)  Indication(s) Respiratory Failure  Consent Unable to obtain consent due to emergent nature of procedure.   Anesthesia Etomidate and Rocuronium   Time Out Verified patient identification, verified procedure, site/side was marked, verified correct patient position, special equipment/implants available, medications/allergies/relevant history reviewed, required imaging and test results available.   Sterile Technique Usual hand hygeine, masks, and gloves were used   Procedure Description Patient positioned in bed supine.  Sedation given as noted above.  Patient was intubated with endotracheal tube using  MAC 4 .  View was Grade 1 full glottis .  Number of attempts was 1.  Colorimetric CO2 detector was consistent with tracheal placement.   Complications/Tolerance None; patient tolerated the procedure well. Chest X-ray is ordered to verify placement.   EBL Minimal   Specimen(s) None

## 2021-05-26 NOTE — Progress Notes (Signed)
Neurosurgery Service Progress Note  Subjective: No clinical seizures overnight, no other events / issues  Objective: Vitals:   05/26/21 0700 05/26/21 0730 05/26/21 0800 05/26/21 0829  BP: 138/72 138/72 134/76   Pulse: 85 95 94   Resp: 16 15 (!) 25   Temp:    99.5 F (37.5 C)  TempSrc:      SpO2: 96% 99% 97%   Weight:      Height:        Physical Exam: Intubated and sedated, EEG with diffuse slowing, raising eyebrows but not opening eyes to painful stim, w/d x4, pupil reactive OS, NR / surgical OD  Assessment & Plan: 65 y.o. woman s/p resection of planum meningioma, recovering well. 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 Post-op course w/ GTC on POD1, started keppra 8/23, status epilepticus, 8/25 intubation 2/2 status epilepticus  -improving, no sz, sedation lifting but still on multiple AEDs with expected sedation -SCDs/TEDs, [facility]  Judith Part  05/26/21 9:48 AM

## 2021-05-26 NOTE — Procedures (Addendum)
Patient Name: Olivia Shah  MRN: [id number]  Epilepsy Attending: Lora Havens  Referring Physician/Provider: Dr Emelda Brothers Duration: 05/25/2021 1052 to 05/26/2021 0936   Patient history: 65 y.o. woman s/p resection of planum meningioma, recovering well. 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 Post-op course w/ GTC on POD1, started keppra 8/23.  Continues to be encephalopathic.  EEG to evaluate for seizures.   Level of alertness: awake, asleep   AEDs during EEG study: LEV, PHT, LCM   Technical aspects: This EEG study was done with scalp electrodes positioned according to the 10-20 International system of electrode placement. Electrical activity was acquired at a sampling rate of '500Hz'$  and reviewed with a high frequency filter of '70Hz'$  and a low frequency filter of '1Hz'$ . EEG data were recorded continuously and digitally stored.    Description: No posterior dominant rhythm was seen. Sleep was characterized by sleep spindles (12-'14hz'$ ), maximal frontocentral region. EEG also showed continuous generalized and maximal right frontotemporal 3-'6Hz'$  theta-delta slowing.     ABNORMALITY - Continuous slow, generalized and maximal right frontotemporal region   IMPRESSION: This study is suggestive of cortical dysfunction in right frontotemporal region due to underlying structural abnormality. Additionally, there is moderate diffuse encephalopathy, nonspecific etiology. No seizures were seen during this study   [place]

## 2021-05-26 NOTE — Progress Notes (Signed)
PT Cancellation Note  Patient Details Name: Olivia Shah MRN: [id number] DOB: [birth year]   Cancelled Treatment:    Reason Eval/Treat Not Completed: Patient not medically ready. Pt was extubated then immediately needed to be re-intubated. PT to hold today and return as able, as appropriate to complete PT eval.  Kittie Plater, PT, DPT Acute Rehabilitation Services Pager #: [phone] Office #: [phone]    Berline Lopes 05/26/2021, 10:59 AM

## 2021-05-26 NOTE — Progress Notes (Signed)
Patient tolerated spontaneous breathing trial, she was following intermittent commands Decision was made to extubate, post extubation patient was noted to have stridor She was nebulized with epinephrine, IV steroids were restarted After 15 minutes of being extubated, she started getting hypoxic, tachypneic and unresponsive.    Patient was reintubated using RSI method. Patient's family was updated about recent development     Jacky Kindle MD [facility] See Amion for pager If no response to pager, please call [phone] until 7pm After 7pm, Please call E-link [phone]

## 2021-05-26 NOTE — Procedures (Signed)
Extubation Procedure Note  Patient Details:   Name: Olivia Shah DOB: 06-02-1956 MRN: [id number]   Airway Documentation:    Vent end date: (not recorded) Vent end time: (not recorded)   Evaluation  O2 sats: stable throughout Complications: Complications of stridor Patient did not tolerate procedure well. Bilateral Breath Sounds: Stridor   No  RT extubated patient to 4L [place] per MD order with RN at bedside. No cuff leak prior to extubation. MD made aware and told RT to continue with extubation. Patient immediately developed stridor and RT gave racemic x2. RT and RN at bedside.   Fabiola Backer 05/26/2021, 10:42 AM

## 2021-05-26 NOTE — Progress Notes (Signed)
NAME:  Olivia Shah, MRN:  [id number], DOB:  11-23-1955, LOS: 8 ADMISSION DATE:  05/18/2021, CONSULTATION DATE:  05/20/21 REFERRING MD:  Zada Finders, CHIEF COMPLAINT:  AMS, seizures   History of Present Illness:  Olivia Shah is a 65 y.o. female who has PMH including congenital right-sided blindness, HTN, DM, arthritis.  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  She was taken to the OR for right pterional craniotomy which was successful.   The following morning, she was noted to have a 30 second GTC seizure that resolved spontaneously.  She then returned to baseline; however, she was started on Keppra 1g twice daily as prophylaxis.  Unfortunately, later that morning, she had recurrent seizure lasting about 1 minute again resolving spontaneously.   EEG 8/24 AM demonstrated cortical dysfunction in the right frontotemporal region, moderate diffuse encephalopathy, no seizures or definite epileptiform discharges. EEG 8/24 PM was reviewed and showed 5 seizures arising in the right hemisphere which were subclinical.   Due to recurrent seizures, PCCM asked to see in consultation  Pertinent  Medical History  Meningioma HTN DM  Significant Hospital Events: Including procedures, antibiotic start and stop dates in addition to other pertinent events   8/22 > OR for right craniotomy. 8/23 > 2 separate seizures that resolved spontaneously. 8/24 > subclinical seizures (5 noted on EEG). 8/25 > frequency of seizures increasing despite keppra and phenytoin, started vimpat and phenobarbital, Intubated for GCS of 7.  Interim History / Subjective:  No overnight event, white count is slightly trended up with T-max 99.5  Objective   Blood pressure (!) 143/82, pulse 93, temperature 99.5 F (37.5 C), resp. rate (!) 27, height '5\' 2"'$  (1.575 m), weight 88.7 kg, SpO2 95 %.    Vent Mode: CPAP;PSV FiO2 (%):  [40 %] 40 % Set Rate:  [16 bmp] 16  bmp Vt Set:  [400 mL] 400 mL PEEP:  [5 cmH20] 5 cmH20 Pressure Support:  [8 cmH20] 8 cmH20 Plateau Pressure:  [15 cmH20-16 cmH20] 15 cmH20   Intake/Output Summary (Last 24 hours) at 05/26/2021 1105 Last data filed at 05/26/2021 1000 Gross per 24 hour  Intake 1794.05 ml  Output 900 ml  Net 894.05 ml   Filed Weights   05/18/21 0603 05/18/21 1500 05/26/21 0500  Weight: 86.2 kg 85.5 kg 88.7 kg    Examination:   Physical exam: General: Crtitically ill-appearing elderly African-American female, orally intubated HEENT: Craniotomy scar looks clean and dry, there is swelling around right orbit, moist mucous membranes Neuro: Opens eyes with tactile stimuli, intermittently following simple commands, withdrawing in bilateral lower extremities, localizing right upper extremity Chest: Coarse breath sounds, no wheezes or rhonchi Heart: Regular rate and rhythm, no murmurs or gallops Abdomen: Soft, nontender, nondistended, bowel sounds present Skin: No rash   Resolved Hospital Problem list     Assessment & Plan:   Acute encephalopathy due to seizures Status epilepticus Acute hypoxic respiratory failure due to status epilepticus 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 Patient is off sedation but remains on multiple AEDs, still remain lethargic but intermittently following commands Tolerating SBT, will try to see if we can extubate her EEG is negative for active seizures Appreciate neurosurgery and neurology recommendations Avoid sedation with RASS goal 0/-1 Continue phenytoin, Vimpat and Keppra Appreciate neurology input  DM2 with Hyperglycemia Still blood sugars are not well controlled Change Levemir to 8 units twice daily, continue sliding scale insulin Holding metformin  Monitor fingerstick with goal 140-180  Hypertension Blood pressure remained stable Continue home meds  Best Practice (right click and "Reselect all SmartList Selections" daily)    Diet/type: tubefeeds DVT prophylaxis: prophylactic heparin  GI prophylaxis: PPI Lines: yes and it is still needed picc R arm placed 8/27 Foley:  N/A Code Status:  full code Last date of multidisciplinary goals of care discussion: per primary  Labs   CBC: Recent Labs  Lab 05/21/21 1650 05/22/21 0512 05/23/21 0426 05/24/21 0537 05/25/21 0516  WBC 8.3 8.5 7.8 10.8* 11.9*  HGB 11.4* 10.6* 10.7* 9.6* 9.4*  HCT 35.7* 32.6* 34.1* 30.5* 28.3*  MCV 89.0 87.9 90.7 88.7 85.2  PLT 272 235 238 246 [id number]    Basic Metabolic Panel: Recent Labs  Lab 05/20/21 1645 05/21/21 0223 05/21/21 1548 05/21/21 1650 05/22/21 0512 05/23/21 0426 05/24/21 0537 05/25/21 0516  NA  --   --    < > 141 140 139 139 136  K  --   --    < > 3.4* 3.6 4.5 3.9 4.0  CL  --   --   --  107 106 106 103 100  CO2  --   --   --  '26 25 25 29 28  '$ GLUCOSE  --   --   --  227* 256* 243* 139* 175*  BUN  --   --   --  20 24* 29* 21 21  CREATININE  --   --   --  0.74 0.76 0.80 0.54 0.56  CALCIUM  --   --   --  8.5* 8.0* 7.9* 8.2* 8.4*  MG 1.8 1.8  --  1.8  --   --   --   --   PHOS 2.1* 3.1  --  1.5*  --   --   --   --    < > = values in this interval not displayed.   GFR: Estimated Creatinine Clearance: 70.6 mL/min (by C-G formula based on SCr of 0.56 mg/dL). Recent Labs  Lab 05/22/21 0512 05/23/21 0426 05/24/21 0537 05/25/21 0516  WBC 8.5 7.8 10.8* 11.9*    Liver Function Tests: Recent Labs  Lab 05/21/21 0223  AST 17  ALT 11  ALKPHOS 43  BILITOT 0.7  PROT 5.5*  ALBUMIN 2.6*   No results for input(s): LIPASE, AMYLASE in the last 168 hours. No results for input(s): AMMONIA in the last 168 hours.  ABG    Component Value Date/Time   PHART 7.487 (H) 05/21/2021 1548   PCO2ART 38.4 05/21/2021 1548   PO2ART 155 (H) 05/21/2021 1548   HCO3 29.0 (H) 05/21/2021 1548   TCO2 30 05/21/2021 1548   O2SAT 100.0 05/21/2021 1548     Coagulation Profile: No results for input(s): INR, PROTIME in the last 168  hours.  Cardiac Enzymes: No results for input(s): CKTOTAL, CKMB, CKMBINDEX, TROPONINI in the last 168 hours.  HbA1C: Hgb A1c MFr Bld  Date/Time Value Ref Range Status  05/11/2021 08:31 AM 6.6 (H) 4.8 - 5.6 % Final    Comment:    (NOTE) Pre diabetes:          5.7%-6.4%  Diabetes:              >6.4%  Glycemic control for   <7.0% adults with diabetes     CBG: Recent Labs  Lab 05/25/21 1520 05/25/21 1951 05/26/21 0001 05/26/21 0346 05/26/21 0815  GLUCAP 182* 252* 242* 130* 138*    Total critical care time:  41 minutes  Performed by: [place] care time was exclusive of separately billable procedures and treating other patients.   Critical care was necessary to treat or prevent imminent or life-threatening deterioration.   Critical care was time spent personally by me on the following activities: development of treatment plan with patient and/or surrogate as well as nursing, discussions with consultants, evaluation of patient's response to treatment, examination of patient, obtaining history from patient or surrogate, ordering and performing treatments and interventions, ordering and review of laboratory studies, ordering and review of radiographic studies, pulse oximetry and re-evaluation of patient's condition.   Jacky Kindle MD  Pulmonary Critical Care See Amion for pager If no response to pager, please call [phone] until 7pm After 7pm, Please call E-link [phone]

## 2021-05-26 NOTE — Progress Notes (Signed)
OT Cancellation Note  Patient Details Name: Olivia Shah MRN: [id number] DOB: Sep 28, 1955   Cancelled Treatment:    Reason Eval/Treat Not Completed: Patient not medically ready. Pt re intubated and not appropriate for therapy at this time.   Billey Chang, OTR/L  Acute Rehabilitation Services Pager: [phone] Office: [phone] .  05/26/2021, 11:46 AM

## 2021-05-26 NOTE — Progress Notes (Signed)
[facility] notified. LTM removed. No skin breakdown seen.

## 2021-05-27 DIAGNOSIS — J9601 Acute respiratory failure with hypoxia: Secondary | ICD-10-CM | POA: Diagnosis not present

## 2021-05-27 DIAGNOSIS — G40901 Epilepsy, unspecified, not intractable, with status epilepticus: Secondary | ICD-10-CM | POA: Diagnosis not present

## 2021-05-27 DIAGNOSIS — D496 Neoplasm of unspecified behavior of brain: Secondary | ICD-10-CM | POA: Diagnosis not present

## 2021-05-27 LAB — GLUCOSE, CAPILLARY
Glucose-Capillary: 170 mg/dL — ABNORMAL HIGH (ref 70–99)
Glucose-Capillary: 175 mg/dL — ABNORMAL HIGH (ref 70–99)
Glucose-Capillary: 177 mg/dL — ABNORMAL HIGH (ref 70–99)
Glucose-Capillary: 189 mg/dL — ABNORMAL HIGH (ref 70–99)
Glucose-Capillary: 198 mg/dL — ABNORMAL HIGH (ref 70–99)
Glucose-Capillary: 215 mg/dL — ABNORMAL HIGH (ref 70–99)

## 2021-05-27 LAB — CULTURE, RESPIRATORY W GRAM STAIN

## 2021-05-27 MED ORDER — METOPROLOL TARTRATE 50 MG PO TABS
100.0000 mg | ORAL_TABLET | Freq: Every day | ORAL | Status: DC
  Administered 2021-05-27 – 2021-06-03 (×6): 100 mg
  Filled 2021-05-27 (×8): qty 2

## 2021-05-27 MED ORDER — FUROSEMIDE 10 MG/ML IJ SOLN
40.0000 mg | Freq: Once | INTRAMUSCULAR | Status: AC
  Administered 2021-05-27: 40 mg via INTRAVENOUS
  Filled 2021-05-27: qty 4

## 2021-05-27 MED ORDER — INSULIN DETEMIR 100 UNIT/ML ~~LOC~~ SOLN
10.0000 [IU] | Freq: Two times a day (BID) | SUBCUTANEOUS | Status: DC
  Administered 2021-05-27 – 2021-06-04 (×16): 10 [IU] via SUBCUTANEOUS
  Filled 2021-05-27 (×19): qty 0.1

## 2021-05-27 NOTE — Progress Notes (Addendum)
NAME:  Olivia Shah, MRN:  [id number], DOB:  21-Mar-1956, LOS: 9 ADMISSION DATE:  05/18/2021, CONSULTATION DATE:  05/20/21 REFERRING MD:  Zada Finders, CHIEF COMPLAINT:  AMS, seizures   History of Present Illness:  Olivia Shah is a 65 y.o. female who has PMH including congenital right-sided blindness, HTN, DM, arthritis.  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  She was taken to the OR for right pterional craniotomy which was successful.   The following morning, she was noted to have a 30 second GTC seizure that resolved spontaneously.  She then returned to baseline; however, she was started on Keppra 1g twice daily as prophylaxis.  Unfortunately, later that morning, she had recurrent seizure lasting about 1 minute again resolving spontaneously.   EEG 8/24 AM demonstrated cortical dysfunction in the right frontotemporal region, moderate diffuse encephalopathy, no seizures or definite epileptiform discharges. EEG 8/24 PM was reviewed and showed 5 seizures arising in the right hemisphere which were subclinical.   Due to recurrent seizures, PCCM asked to see in consultation  Pertinent  Medical History  Meningioma HTN DM  Significant Hospital Events: Including procedures, antibiotic start and stop dates in addition to other pertinent events   8/22 > OR for right craniotomy. 8/23 > 2 separate seizures that resolved spontaneously. 8/24 > subclinical seizures (5 noted on EEG). 8/25 > frequency of seizures increasing despite keppra and phenytoin, started vimpat and phenobarbital, Intubated for GCS of 7. 8/30: Extubated, failed, reintubated  Interim History / Subjective:  Patient failed extubation trial yesterday, had to be reintubated because of encephalopathy and unable to protect her airway Off sedation  Objective   Blood pressure (!) 101/54, pulse 79, temperature 99.7 F (37.6 C), temperature source Axillary, resp.  rate 16, height '5\' 2"'$  (1.575 m), weight 85.6 kg, SpO2 97 %.    Vent Mode: PSV;CPAP FiO2 (%):  [40 %-100 %] 40 % Set Rate:  [16 bmp] 16 bmp Vt Set:  [400 mL] 400 mL PEEP:  [5 cmH20] 5 cmH20 Pressure Support:  [10 cmH20] 10 cmH20 Plateau Pressure:  [17 cmH20-22 cmH20] 19 cmH20   Intake/Output Summary (Last 24 hours) at 05/27/2021 0836 Last data filed at 05/27/2021 0600 Gross per 24 hour  Intake 2499.34 ml  Output 2775 ml  Net -275.66 ml   Filed Weights   05/18/21 1500 05/26/21 0500 05/27/21 0414  Weight: 85.5 kg 88.7 kg 85.6 kg    Examination:   Physical exam: General: Crtitically ill-appearing elderly African-American female, orally intubated HEENT: Craniotomy scar looks clean and dry, there is swelling around right orbit, moist mucous membranes Neuro: Opens eyes with vocal stimuli, following simple commands, moving all 4 extremities Chest: Bilateral basal crackles, no wheezes or rhonchi Heart: Regular rate and rhythm, no murmurs or gallops Abdomen: Soft, nontender, nondistended, bowel sounds present Skin: No rash   Resolved Hospital Problem list     Assessment & Plan:   Acute encephalopathy due to seizures Status epilepticus Acute hypoxic respiratory failure due to status epilepticus 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 Patient failed extubation trial on 8/30 Had to be reintubated due to lethargy and encephalopathy No more seizures per EEG read Neurology is following, appreciate their input Currently tolerating SBT Appreciate neurosurgery and neurology recommendations Avoid sedation with RASS goal 0/-1 Continue phenytoin, Vimpat and Keppra  Probable aspiration pneumonia Patient started spiking fever with increasing white count Respiratory culture is pending Started on IV Unasyn  DM2 with Hyperglycemia Still blood sugars are not well controlled Increase Levemir to 10 units twice daily, continue sliding scale insulin Holding  metformin Monitor fingerstick with goal 140-180  Hypertension Blood pressure remained stable Continue home meds  Best Practice (right click and "Reselect all SmartList Selections" daily)   Diet/type: tubefeeds DVT prophylaxis: prophylactic heparin  GI prophylaxis: PPI Lines: yes and it is still needed picc R arm placed 8/27 Foley:  N/A Code Status:  full code Last date of multidisciplinary goals of care discussion: per primary  Labs   CBC: Recent Labs  Lab 05/21/21 1650 05/22/21 0512 05/23/21 0426 05/24/21 0537 05/25/21 0516  WBC 8.3 8.5 7.8 10.8* 11.9*  HGB 11.4* 10.6* 10.7* 9.6* 9.4*  HCT 35.7* 32.6* 34.1* 30.5* 28.3*  MCV 89.0 87.9 90.7 88.7 85.2  PLT 272 235 238 246 [id number]    Basic Metabolic Panel: Recent Labs  Lab 05/20/21 1645 05/21/21 0223 05/21/21 1548 05/21/21 1650 05/22/21 0512 05/23/21 0426 05/24/21 0537 05/25/21 0516  NA  --   --    < > 141 140 139 139 136  K  --   --    < > 3.4* 3.6 4.5 3.9 4.0  CL  --   --   --  107 106 106 103 100  CO2  --   --   --  '26 25 25 29 28  '$ GLUCOSE  --   --   --  227* 256* 243* 139* 175*  BUN  --   --   --  20 24* 29* 21 21  CREATININE  --   --   --  0.74 0.76 0.80 0.54 0.56  CALCIUM  --   --   --  8.5* 8.0* 7.9* 8.2* 8.4*  MG 1.8 1.8  --  1.8  --   --   --   --   PHOS 2.1* 3.1  --  1.5*  --   --   --   --    < > = values in this interval not displayed.   GFR: Estimated Creatinine Clearance: 69.3 mL/min (by C-G formula based on SCr of 0.56 mg/dL). Recent Labs  Lab 05/22/21 0512 05/23/21 0426 05/24/21 0537 05/25/21 0516  WBC 8.5 7.8 10.8* 11.9*    Liver Function Tests: Recent Labs  Lab 05/21/21 0223  AST 17  ALT 11  ALKPHOS 43  BILITOT 0.7  PROT 5.5*  ALBUMIN 2.6*   No results for input(s): LIPASE, AMYLASE in the last 168 hours. No results for input(s): AMMONIA in the last 168 hours.  ABG    Component Value Date/Time   PHART 7.487 (H) 05/21/2021 1548   PCO2ART 38.4 05/21/2021 1548   PO2ART 155  (H) 05/21/2021 1548   HCO3 29.0 (H) 05/21/2021 1548   TCO2 30 05/21/2021 1548   O2SAT 100.0 05/21/2021 1548     Coagulation Profile: No results for input(s): INR, PROTIME in the last 168 hours.  Cardiac Enzymes: No results for input(s): CKTOTAL, CKMB, CKMBINDEX, TROPONINI in the last 168 hours.  HbA1C: Hgb A1c MFr Bld  Date/Time Value Ref Range Status  05/11/2021 08:31 AM 6.6 (H) 4.8 - 5.6 % Final    Comment:    (NOTE) Pre diabetes:          5.7%-6.4%  Diabetes:              >6.4%  Glycemic control for   <7.0% adults with diabetes     CBG: Recent Labs  Lab 05/26/21  [place] 05/26/21 1956 05/26/21 2342 05/27/21 0400 05/27/21 0808  GLUCAP 245* 242* 257* 170* 215*    Total critical care time: 35 minutes  Performed by: [place] care time was exclusive of separately billable procedures and treating other patients.   Critical care was necessary to treat or prevent imminent or life-threatening deterioration.   Critical care was time spent personally by me on the following activities: development of treatment plan with patient and/or surrogate as well as nursing, discussions with consultants, evaluation of patient's response to treatment, examination of patient, obtaining history from patient or surrogate, ordering and performing treatments and interventions, ordering and review of laboratory studies, ordering and review of radiographic studies, pulse oximetry and re-evaluation of patient's condition.   Jacky Kindle MD [place] [facility] See Amion for pager If no response to pager, please call [phone] until 7pm After 7pm, Please call E-link [phone]

## 2021-05-27 NOTE — Progress Notes (Signed)
Neurosurgery Service Progress Note  Subjective: Was extubated yesterday but too somnolent 2/2 AEDs, had to be reintubated but was FC x4  Objective: Vitals:   05/27/21 0600 05/27/21 0700 05/27/21 0735 05/27/21 0800  BP: 111/66 120/69 (!) 101/54 119/63  Pulse: 79 78  87  Resp: 16 18  (!) 26  Temp:      TempSrc:      SpO2: 98% 96% 97% 97%  Weight:      Height:        Physical Exam: Intubated and sedated, raising eyebrows but not opening eyes to painful stim, w/d x4, pupil reactive OS, NR / surgical OD  Assessment & Plan: 65 y.o. woman s/p resection of planum meningioma, recovering well. 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 Post-op course w/ GTC on POD1, started keppra 8/23, status epilepticus, 8/25 intubation 2/2 status epilepticus, 8/30 extubated and reintubated  -thankfully improving, appreciate CCM care, no change in neurosurgical plan of care  Judith Part  05/27/21 8:58 AM

## 2021-05-27 NOTE — Progress Notes (Signed)
Neurology progress note  Major Interval events/Subjective:  Propofol weaned, continues on precedex, no clinical or electrographic seizures past 24 hrs  ROS: Unable to obtain due to poor mental status  Examination  Vital signs in last 24 hours: Temp:  [98.7 F (37.1 C)-100.1 F (37.8 C)] 99.7 F (37.6 C) (08/31 0400) Pulse Rate:  [78-129] 79 (08/31 0600) Resp:  [12-31] 16 (08/31 0600) BP: (69-156)/(51-87) 111/66 (08/31 0600) SpO2:  [82 %-100 %] 98 % (08/31 0600) FiO2 (%):  [40 %-100 %] 40 % (08/31 0349) Weight:  [85.6 kg] 85.6 kg (08/31 0414)  General: lying in bed, NAD CVS: pulse-normal rate and rhythm RS: Comfortable on the ventilator without audible wheezing or clear dyssynchrony Extremities: normal, warm  Neuro:  Mental status: Very sleepy, does not follow commands, does not have any verbal output, does not track examiner. Cranial nerves: Right eye postsurgical, left eye sluggishly reactive, corneals intact, face symmetric within limits of ET tube cough intact per nursing, she occasionally has some movements of her face that appear to be mouthing the tube, not rhythmic and not concerning for seizure activity as they appear to be triggered by stimulation Sensory/motor: Withdraws in all 4 extremities but not briskly   Basic Metabolic Panel: Recent Labs  Lab 05/20/21 1645 05/21/21 0223 05/21/21 1548 05/21/21 1650 05/22/21 0512 05/23/21 0426 05/24/21 0537 05/25/21 0516  NA  --   --    < > 141 140 139 139 136  K  --   --    < > 3.4* 3.6 4.5 3.9 4.0  CL  --   --   --  107 106 106 103 100  CO2  --   --   --  '26 25 25 29 28  '$ GLUCOSE  --   --   --  227* 256* 243* 139* 175*  BUN  --   --   --  20 24* 29* 21 21  CREATININE  --   --   --  0.74 0.76 0.80 0.54 0.56  CALCIUM  --   --    < > 8.5* 8.0* 7.9* 8.2* 8.4*  MG 1.8 1.8  --  1.8  --   --   --   --   PHOS 2.1* 3.1  --  1.5*  --   --   --   --    < > = values in this interval not displayed.    CBC: Recent Labs  Lab  05/21/21 1650 05/22/21 0512 05/23/21 0426 05/24/21 0537 05/25/21 0516  WBC 8.3 8.5 7.8 10.8* 11.9*  HGB 11.4* 10.6* 10.7* 9.6* 9.4*  HCT 35.7* 32.6* 34.1* 30.5* 28.3*  MCV 89.0 87.9 90.7 88.7 85.2  PLT 272 235 238 246 244   Results for ELIZ, VITATOE (MRN [id number]) as of 05/23/2021 07:42  Ref. Range 05/22/2021 12:59 05/22/2021 21:39  PHENOBARBITAL Latest Ref Range: 15.0 - 30.0 ug/mL  21.8  Phenytoin Lvl Latest Ref Range: 10.0 - 20.0 ug/mL 13.6     Coagulation Studies: No results for input(s): LABPROT, INR in the last 72 hours.  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 No significant residual masslike enhancement.This study will serve as a new baseline future follow-up imaging. 2. Acute infarcts in the right anterior temporal lobe, inferior frontal lobe, lentiform nucleus and posterior limb of the right internal capsule with mild associated edema. 3. Moderate right greater than left antidependent pneumocephalus (up  to 1.8 cm thick) and small volume right-sided extra-axial hemorrhage and/or gelfoam subjacent to the craniotomy (up to 0.9 cm thick) withresulting 3 mm of leftward midline shift and effacement of the right lateral ventricle.            EEG    05/21/2021 1052 to 05/22/2021 0924:  This study was initially suggestive of focal status epilepticus arising from right frontotemporal region. Patient was intubated and propofol was started on 05/21/2021 at around 1422 after which seizure frequency improved. Last seizure was noted on 05/21/2021 at 1651.  There is also evidence of moderate to severe diffuse encephalopathy, nonspecific etiology but likely due to seizures, sedation.  Patient event button was pressed on 05/21/2021 at 2132 and 05/22/2021 at 0735 during which RN reported facial twitching without concomitant EEG change. However focal motor seizures may not be seen on  scalp EEG.  Therefore clinical correlation is recommended.  EEG appears to be improving compared to previous day.  05/23/2021 - 05/25/21 This study is suggestive of independent non-specific cortical dysfunction in left hemisphere as well as right frontotemporal region. There is also moderate to severe diffuse encephalopathy, nonspecific etiology. No seizures were seen during this study  ASSESSMENT AND PLAN: 65 year old female on the Neurosurgery service, with new onset seizures s/p right pterional craniotomy for meningioma resection which was also complicated by right hemispheric strokes.  Focal motor status is improved with intubation and addition of propofol, susbequently successfully weaned 8/27. Now will wean phenobarb  Focal convulsive status epilepticus, refractory, now improving. Meningioma status post right pterional craniotomy Acute right hemispheric strokes  Hypoproteinemia with hypoalbuminemia Acute encephalopathy, postictal -LTM EEG showed focal convulsive status epilepticus, now controlled   Recommendations -Already on Keppra 1500 mg twice daily, which will be continued but may need to be decreased if her renal function worsens -Loaded with fosphenytoin 8/24, currently on phenytoin 100 mg every 8 hours, continue current dose for now  -Level, trough, on 8/26 at 13:00 resulted at 13.6 -Loaded with Vimpat 400 mg 8/25, and started 200 mg twice daily, continue current dose for now - Phenobarbital d/c'd 8/28 -Intubated 8/26 and additional on propofol titrated to seizure suppression (15 mcg/kg/min), propofol subsequently weaned on 8/27 - Consider phenytoin wean 8/31   Su Monks, MD [facility] [phone]  If 7pm- 7am, please page neurology on call as listed in [place].

## 2021-05-27 NOTE — Progress Notes (Signed)
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 Intubated 8/27. Attempt to extubate 8/30 unsuccessful. Reintubated 8/30. PMH: congenitally blind OD, arthritis, DM HTN   OT comments  Pt reevaluated this session due to changes from previous session.pt currently total care +2 for all aspects. Pt moving R UE toward face. Pt not following any commands. Pt responses to painful stimuli in all extremities. Recommendations updated due to current levels.    Follow Up Recommendations  LTACH    Equipment Recommendations  Wheelchair (measurements OT);Wheelchair cushion (measurements OT);Hospital bed    Recommendations for Other Services      Precautions / Restrictions Precautions Precautions: Fall Precaution Comments: seizures Restrictions Weight Bearing Restrictions: No       Mobility Bed Mobility Overal bed mobility: Needs Assistance Bed Mobility: Supine to Sit;Sit to Supine;Rolling Rolling: Total assist;+2 for physical assistance;+2 for safety/equipment   Supine to sit: Total assist;+2 for physical assistance;+2 for safety/equipment Sit to supine: Total assist;+2 for physical assistance;+2 for safety/equipment   General bed mobility comments: totalA+2 for all aspects of bed mobility due to decreased level of arousal. TotalA to maintain upright sitting EOB. Assist for eye opening    Transfers                 General transfer comment: Deferred    Balance Overall balance assessment: Needs assistance Sitting-balance support: No upper extremity supported;Feet unsupported Sitting balance-Leahy Scale: Zero Sitting balance -  Comments: totalA to maintain sitting EOB                                   ADL either performed or assessed with clinical judgement   ADL Overall ADL's : Needs assistance/impaired                                       General ADL Comments: total (A) for all no awareness to incontinence     Vision       Perception     Praxis      Cognition Arousal/Alertness: Lethargic Behavior During Therapy: Flat affect Overall Cognitive Status: Difficult to assess                                          Exercises Exercises: Other exercises Other Exercises Other Exercises: Seated trunk extension and rotation with totalA, encouraged WBing through bilateral UEs Other Exercises: cervical extension PROM   Shoulder Instructions       General Comments Vent settings: 40% FiO2 and PEEP 5    Pertinent Vitals/ Pain       Pain Assessment: Faces Faces Pain Scale: Hurts a little bit Pain Location: noxious stimuli x 4 extremities Pain Descriptors / Indicators: Grimacing  Home Living Family/patient expects to be discharged to:: Private residence Living Arrangements: Alone Available Help at Discharge: Family;Available 24 hours/day Type of Home: House Home Access: Stairs to enter [employer] of Steps: 3 Entrance Stairs-Rails: Right Home Layout: One  level     Bathroom Shower/Tub: Therapist, art: Yes   Home Equipment: Toilet riser          Prior Functioning/Environment Level of Independence: Independent        Comments: drove   Frequency  Min 2X/week        Progress Toward Goals  OT Goals(current goals can now be found in the care plan section)  Progress towards OT goals: Not progressing toward goals - comment  Acute Rehab OT Goals Patient Stated Goal: unable to state OT Goal Formulation: Patient unable to participate in goal setting Time For Goal  Achievement: 06/03/21 Potential to Achieve Goals: Good ADL Goals Pt Will Perform Grooming: sitting;with max assist Pt Will Perform Upper Body Bathing: sitting;with max assist Pt Will Transfer to Toilet: bedside commode;stand pivot transfer;with +2 assist;with max assist Additional ADL Goal #1: pt will complete bed mobility mma(A) as precursor to adls.  Plan Discharge plan needs to be updated;All goals met and education completed, patient discharged from OT services;Frequency needs to be updated    Co-evaluation    PT/OT/SLP Co-Evaluation/Treatment: Yes Reason for Co-Treatment: Complexity of the patient's impairments (multi-system involvement);Necessary to address cognition/behavior during functional activity;For patient/therapist safety;To address functional/ADL transfers PT goals addressed during session: Mobility/safety with mobility OT goals addressed during session: ADL's and self-care;Proper use of Adaptive equipment and DME;Strengthening/ROM      AM-PAC OT "6 Clicks" Daily Activity     Outcome Measure   Help from another person eating meals?: Total Help from another person taking care of personal grooming?: Total Help from another person toileting, which includes using toliet, bedpan, or urinal?: Total Help from another person bathing (including washing, rinsing, drying)?: Total Help from another person to put on and taking off regular upper body clothing?: Total Help from another person to put on and taking off regular lower body clothing?: Total 6 Click Score: 6    End of Session Equipment Utilized During Treatment: Oxygen  OT Visit Diagnosis: Unsteadiness on feet (R26.81);Muscle weakness (generalized) (M62.81)   Activity Tolerance Patient limited by lethargy   Patient Left in bed;with call bell/phone within reach;with bed alarm set;with nursing/sitter in room   Nurse Communication Mobility status;Precautions        Time: 0051-1021 OT Time Calculation (min): 33  min  Charges: OT General Charges $OT Visit: 1 Visit OT Evaluation $OT Re-eval: 1 Re-eval   Brynn, OTR/L  Acute Rehabilitation Services Pager: [phone] Office: [phone] .    Jeri Modena 05/27/2021, 3:07 PM

## 2021-05-27 NOTE — Progress Notes (Signed)
Neurology progress note  Major Interval events/Subjective:  EEG d/c'd after no seizures x24 hrs Patient passed SBT yesterday and was extubated but had to be reintubated 2/2 respiratory distress  ROS: Unable to obtain due to poor mental status  Examination  Vital signs in last 24 hours: Temp:  [99.3 F (37.4 C)-100.1 F (37.8 C)] 99.8 F (37.7 C) (08/31 1600) Pulse Rate:  [78-108] 93 (08/31 1600) Resp:  [16-28] 17 (08/31 1600) BP: (91-129)/(54-79) 111/69 (08/31 1600) SpO2:  [96 %-100 %] 99 % (08/31 1600) FiO2 (%):  [40 %] 40 % (08/31 1600) Weight:  [85.6 kg] 85.6 kg (08/31 0414)  General: lying in bed, NAD CVS: pulse-normal rate and rhythm RS: Comfortable on the ventilator without audible wheezing or clear dyssynchrony Extremities: normal, warm  Neuro:  Mental status: Very sleepy, does not follow commands, does not have any verbal output, does not track examiner. Cranial nerves: Right eye postsurgical, left eye sluggishly reactive, corneals intact, face symmetric within limits of ET tube cough intact per nursing, she occasionally has some movements of her face that appear to be mouthing the tube, not rhythmic and not concerning for seizure activity as they appear to be triggered by stimulation Sensory/motor: Withdraws in all 4 extremities but not briskly   Basic Metabolic Panel: Recent Labs  Lab 05/21/21 0223 05/21/21 1548 05/21/21 1650 05/22/21 0512 05/23/21 0426 05/24/21 0537 05/25/21 0516  NA  --    < > 141 140 139 139 136  K  --    < > 3.4* 3.6 4.5 3.9 4.0  CL  --   --  107 106 106 103 100  CO2  --   --  '26 25 25 29 28  '$ GLUCOSE  --   --  227* 256* 243* 139* 175*  BUN  --   --  20 24* 29* 21 21  CREATININE  --   --  0.74 0.76 0.80 0.54 0.56  CALCIUM  --    < > 8.5* 8.0* 7.9* 8.2* 8.4*  MG 1.8  --  1.8  --   --   --   --   PHOS 3.1  --  1.5*  --   --   --   --    < > = values in this interval not displayed.     CBC: Recent Labs  Lab 05/21/21 1650  05/22/21 0512 05/23/21 0426 05/24/21 0537 05/25/21 0516  WBC 8.3 8.5 7.8 10.8* 11.9*  HGB 11.4* 10.6* 10.7* 9.6* 9.4*  HCT 35.7* 32.6* 34.1* 30.5* 28.3*  MCV 89.0 87.9 90.7 88.7 85.2  PLT 272 235 238 246 244    Results for Olivia Shah, Olivia Shah (MRN [id number]) as of 05/23/2021 07:42  Ref. Range 05/22/2021 12:59 05/22/2021 21:39  PHENOBARBITAL Latest Ref Range: 15.0 - 30.0 ug/mL  21.8  Phenytoin Lvl Latest Ref Range: 10.0 - 20.0 ug/mL 13.6     Coagulation Studies: No results for input(s): LABPROT, INR in the last 72 hours.  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 No significant residual masslike enhancement.This study will serve as a new baseline future follow-up imaging. 2. Acute infarcts in the right anterior temporal lobe, inferior frontal lobe, lentiform nucleus and posterior limb of the right internal capsule with mild associated edema. 3. Moderate right greater than left antidependent pneumocephalus (up to 1.8 cm thick) and small volume right-sided extra-axial hemorrhage and/or gelfoam subjacent to the craniotomy (up to  0.9 cm thick) withresulting 3 mm of leftward midline shift and effacement of the right lateral ventricle.            EEG    05/21/2021 1052 to 05/22/2021 0924:  This study was initially suggestive of focal status epilepticus arising from right frontotemporal region. Patient was intubated and propofol was started on 05/21/2021 at around 1422 after which seizure frequency improved. Last seizure was noted on 05/21/2021 at 1651.  There is also evidence of moderate to severe diffuse encephalopathy, nonspecific etiology but likely due to seizures, sedation.  Patient event button was pressed on 05/21/2021 at 2132 and 05/22/2021 at 0735 during which RN reported facial twitching without concomitant EEG change. However focal motor seizures may not be seen on scalp EEG.   Therefore clinical correlation is recommended.  EEG appears to be improving compared to previous day.  05/23/2021 - 05/25/21 This study is suggestive of independent non-specific cortical dysfunction in left hemisphere as well as right frontotemporal region. There is also moderate to severe diffuse encephalopathy, nonspecific etiology. No seizures were seen during this study  ASSESSMENT AND PLAN: 65 year old female on the Neurosurgery service, with new onset seizures s/p right pterional craniotomy for meningioma resection which was also complicated by right hemispheric strokes.  Focal motor status is improved with intubation and addition of propofol, susbequently successfully weaned 8/27. Now will wean phenobarb  Focal convulsive status epilepticus, refractory, now improving. Meningioma status post right pterional craniotomy Acute right hemispheric strokes  Hypoproteinemia with hypoalbuminemia Acute encephalopathy, postictal -LTM EEG showed focal convulsive status epilepticus, now controlled   Recommendations -Already on Keppra 1500 mg twice daily, which will be continued but may need to be decreased if her renal function worsens -Loaded with fosphenytoin 8/24, currently on phenytoin 100 mg every 8 hours, continue current dose for now  -Level, trough, on 8/26 at 13:00 resulted at 13.6 -Loaded with Vimpat 400 mg 8/25, and started 200 mg twice daily, continue current dose for now - Phenobarbital d/c'd 8/28 -Intubated 8/26 and additional on propofol titrated to seizure suppression (15 mcg/kg/min), propofol subsequently weaned on 8/27   Su Monks, MD [facility] [phone]  If 7pm- 7am, please page neurology on call as listed in [place].

## 2021-05-27 NOTE — Evaluation (Addendum)
Physical Therapy Re-Evaluation Patient Details Name: Olivia Shah MRN: [id number] DOB: 12/13/1955 Today's Date: 05/27/2021   History of Present Illness  66 female s/p 8/23 resection of planum meningioma R craniotomy and noted to have seizure after procedure MRI (+) acute infarcts R anterior temporal lobe inferior frontal lobe, lentiform nucleus and posterior limb of the R internal capsule. Intubated 8/27. Attempt to extubate 8/30 unsuccessful. Reintubated 8/30. PMH: congenitally blind OD, arthritis, DM HTN  Clinical Impression  Patient seen in conjunction with OT for re-evaluation and to maximize patient's tolerance. Patient off sedation and intubated, however still lethargic and not following commands. Keeping eyes closed throughout session. TotalA+2 for all aspects of bed mobility. TotalA to maintain sitting EOB x ~10 minutes. Performed cervical extension and trunk extension and rotation seated EOB. Patient will benefit from skilled PT services during acute stay to address listed deficits. Recommend CIR at this time to maximize functional independence. Will continue to assess to determine best post-acute venue of care pending patient progress.     Follow Up Recommendations CIR    Equipment Recommendations  Other (comment) (TBD pending progress)    Recommendations for Other Services       Precautions / Restrictions Precautions Precautions: Fall Precaution Comments: seizures Restrictions Weight Bearing Restrictions: No      Mobility  Bed Mobility Overal bed mobility: Needs Assistance Bed Mobility: Supine to Sit;Sit to Supine;Rolling Rolling: Total assist;+2 for physical assistance;+2 for safety/equipment   Supine to sit: Total assist;+2 for physical assistance;+2 for safety/equipment Sit to supine: Total assist;+2 for physical assistance;+2 for safety/equipment   General bed mobility comments: totalA+2 for all aspects of bed mobility due to decreased level of arousal. TotalA  to maintain upright sitting EOB. Assist for eye opening    Transfers                 General transfer comment: Deferred  Ambulation/Gait                Stairs            Wheelchair Mobility    Modified Rankin (Stroke Patients Only) Modified Rankin (Stroke Patients Only) Pre-Morbid Rankin Score: No symptoms Modified Rankin: Severe disability     Balance Overall balance assessment: Needs assistance Sitting-balance support: No upper extremity supported;Feet unsupported Sitting balance-Leahy Scale: Zero Sitting balance - Comments: totalA to maintain sitting EOB                                     Pertinent Vitals/Pain Pain Assessment: Faces Faces Pain Scale: Hurts a little bit Pain Location: noxious stimuli x 4 extremities Pain Descriptors / Indicators: Grimacing    Home Living Family/patient expects to be discharged to:: Private residence Living Arrangements: Alone Available Help at Discharge: Family;Available 24 hours/day Type of Home: House Home Access: Stairs to enter Entrance Stairs-Rails: Right Entrance Stairs-Number of Steps: 3 Home Layout: One level Home Equipment: Toilet riser      Prior Function Level of Independence: Independent         Comments: drove     Hand Dominance        Extremity/Trunk Assessment   Upper Extremity Assessment Upper Extremity Assessment: Defer to OT evaluation    Lower Extremity Assessment Lower Extremity Assessment: Difficult to assess due to impaired cognition (Difficult to assess due to decreased level of arousal. able to withdrawal from painful stimuli.)    Cervical / Trunk  Assessment Cervical / Trunk Assessment: Kyphotic  Communication   Communication: Expressive difficulties;Receptive difficulties  Cognition Arousal/Alertness: Lethargic Behavior During Therapy: Flat affect Overall Cognitive Status: Difficult to assess                                         General Comments General comments (skin integrity, edema, etc.): Vent settings: 40% FiO2 and PEEP 5    Exercises Other Exercises Other Exercises: Seated trunk extension and rotation with totalA, encouraged WBing through bilateral UEs Other Exercises: cervical extension PROM   Assessment/Plan    PT Assessment Patient needs continued PT services  PT Problem List Decreased strength;Decreased activity tolerance;Decreased balance;Decreased mobility;Decreased coordination;Decreased cognition;Decreased safety awareness;Cardiopulmonary status limiting activity;Decreased range of motion       PT Treatment Interventions DME instruction;Gait training;Functional mobility training;Stair training;Therapeutic activities;Therapeutic exercise;Balance training;Neuromuscular re-education;Patient/family education    PT Goals (Current goals can be found in the Care Plan section)  Acute Rehab PT Goals Patient Stated Goal: unable to state PT Goal Formulation: Patient unable to participate in goal setting Time For Goal Achievement: 06/10/21 Potential to Achieve Goals: Fair    Frequency Min 3X/week   Barriers to discharge        Co-evaluation PT/OT/SLP Co-Evaluation/Treatment: Yes Reason for Co-Treatment: Complexity of the patient's impairments (multi-system involvement);For patient/therapist safety PT goals addressed during session: Mobility/safety with mobility         AM-PAC PT "6 Clicks" Mobility  Outcome Measure Help needed turning from your back to your side while in a flat bed without using bedrails?: Total Help needed moving from lying on your back to sitting on the side of a flat bed without using bedrails?: Total Help needed moving to and from a bed to a chair (including a wheelchair)?: Total Help needed standing up from a chair using your arms (e.g., wheelchair or bedside chair)?: Total Help needed to walk in hospital room?: Total Help needed climbing 3-5 steps with a railing? :  Total 6 Click Score: 6    End of Session Equipment Utilized During Treatment: Oxygen Activity Tolerance: Patient limited by lethargy Patient left: in bed;with call bell/phone within reach;with nursing/sitter in room Nurse Communication: Mobility status PT Visit Diagnosis: Unsteadiness on feet (R26.81);Muscle weakness (generalized) (M62.81);Difficulty in walking, not elsewhere classified (R26.2);Other abnormalities of gait and mobility (R26.89);Other symptoms and signs involving the nervous system [id number])    Time: [id number] PT Time Calculation (min) (ACUTE ONLY): 32 min   Charges:   PT Evaluation $PT Re-evaluation: 1 Re-eval          Saleem Coccia A. Gilford Rile PT, DPT Acute Rehabilitation Services Pager [phone] Office [phone]   Linna Hoff 05/27/2021, 1:34 PM

## 2021-05-28 ENCOUNTER — Inpatient Hospital Stay (HOSPITAL_COMMUNITY): Payer: Medicare Other

## 2021-05-28 DIAGNOSIS — D496 Neoplasm of unspecified behavior of brain: Secondary | ICD-10-CM | POA: Diagnosis not present

## 2021-05-28 DIAGNOSIS — J9601 Acute respiratory failure with hypoxia: Secondary | ICD-10-CM | POA: Diagnosis not present

## 2021-05-28 DIAGNOSIS — G40901 Epilepsy, unspecified, not intractable, with status epilepticus: Secondary | ICD-10-CM | POA: Diagnosis not present

## 2021-05-28 LAB — PHOSPHORUS: Phosphorus: 2.9 mg/dL (ref 2.5–4.6)

## 2021-05-28 LAB — POCT I-STAT 7, (LYTES, BLD GAS, ICA,H+H)
Acid-Base Excess: 3 mmol/L — ABNORMAL HIGH (ref 0.0–2.0)
Bicarbonate: 27.4 mmol/L (ref 20.0–28.0)
Calcium, Ion: 1.19 mmol/L (ref 1.15–1.40)
HCT: 29 % — ABNORMAL LOW (ref 36.0–46.0)
Hemoglobin: 9.9 g/dL — ABNORMAL LOW (ref 12.0–15.0)
O2 Saturation: 93 %
Potassium: 4.7 mmol/L (ref 3.5–5.1)
Sodium: 141 mmol/L (ref 135–145)
TCO2: 29 mmol/L (ref 22–32)
pCO2 arterial: 40.3 mmHg (ref 32.0–48.0)
pH, Arterial: 7.441 (ref 7.350–7.450)
pO2, Arterial: 63 mmHg — ABNORMAL LOW (ref 83.0–108.0)

## 2021-05-28 LAB — BASIC METABOLIC PANEL
Anion gap: 7 (ref 5–15)
BUN: 26 mg/dL — ABNORMAL HIGH (ref 8–23)
CO2: 22 mmol/L (ref 22–32)
Calcium: 6.8 mg/dL — ABNORMAL LOW (ref 8.9–10.3)
Chloride: 111 mmol/L (ref 98–111)
Creatinine, Ser: 0.58 mg/dL (ref 0.44–1.00)
GFR, Estimated: >60 mL/min (ref >60)
Glucose, Bld: 179 mg/dL — ABNORMAL HIGH (ref 70–99)
Potassium: 3.1 mmol/L — ABNORMAL LOW (ref 3.5–5.1)
Sodium: 140 mmol/L (ref 135–145)

## 2021-05-28 LAB — GLUCOSE, CAPILLARY
Glucose-Capillary: 191 mg/dL — ABNORMAL HIGH (ref 70–99)
Glucose-Capillary: 192 mg/dL — ABNORMAL HIGH (ref 70–99)
Glucose-Capillary: 199 mg/dL — ABNORMAL HIGH (ref 70–99)
Glucose-Capillary: 260 mg/dL — ABNORMAL HIGH (ref 70–99)
Glucose-Capillary: 166 mg/dL — ABNORMAL HIGH (ref 70–99)
Glucose-Capillary: 98 mg/dL (ref 70–99)

## 2021-05-28 LAB — CBC
HCT: 25.7 % — ABNORMAL LOW (ref 36.0–46.0)
Hemoglobin: 8.3 g/dL — ABNORMAL LOW (ref 12.0–15.0)
MCH: 28.8 pg (ref 26.0–34.0)
MCHC: 32.3 g/dL (ref 30.0–36.0)
MCV: 89.2 fL (ref 80.0–100.0)
Platelets: 316 10*3/uL (ref 150–400)
RBC: 2.88 MIL/uL — ABNORMAL LOW (ref 3.87–5.11)
RDW: 14.8 % (ref 11.5–15.5)
WBC: 9.7 10*3/uL (ref 4.0–10.5)
nRBC: 0 % (ref 0.0–0.2)

## 2021-05-28 LAB — MAGNESIUM: Magnesium: 1.8 mg/dL (ref 1.7–2.4)

## 2021-05-28 MED ORDER — RACEPINEPHRINE HCL 2.25 % IN NEBU
INHALATION_SOLUTION | RESPIRATORY_TRACT | Status: AC
  Filled 2021-05-28: qty 0.5

## 2021-05-28 MED ORDER — RACEPINEPHRINE HCL 2.25 % IN NEBU
0.5000 mL | INHALATION_SOLUTION | Freq: Once | RESPIRATORY_TRACT | Status: AC
  Administered 2021-05-28: 0.5 mL via RESPIRATORY_TRACT

## 2021-05-28 MED ORDER — PHENYTOIN SODIUM 50 MG/ML IJ SOLN
100.0000 mg | Freq: Two times a day (BID) | INTRAMUSCULAR | Status: AC
  Administered 2021-05-28 – 2021-05-31 (×6): 100 mg via INTRAVENOUS
  Filled 2021-05-28 (×7): qty 2

## 2021-05-28 MED ORDER — FUROSEMIDE 10 MG/ML IJ SOLN
40.0000 mg | Freq: Once | INTRAMUSCULAR | Status: AC
  Administered 2021-05-28: 40 mg via INTRAVENOUS
  Filled 2021-05-28: qty 4

## 2021-05-28 MED ORDER — ROCURONIUM BROMIDE 10 MG/ML (PF) SYRINGE
PREFILLED_SYRINGE | INTRAVENOUS | Status: AC
  Administered 2021-05-28: 100 mg via INTRAVENOUS
  Filled 2021-05-28: qty 10

## 2021-05-28 MED ORDER — MAGNESIUM SULFATE 2 GM/50ML IV SOLN
2.0000 g | Freq: Once | INTRAVENOUS | Status: AC
  Administered 2021-05-28: 2 g via INTRAVENOUS
  Filled 2021-05-28: qty 50

## 2021-05-28 MED ORDER — ROCURONIUM BROMIDE 50 MG/5ML IV SOLN: 100.0000 mg | Freq: Once | INTRAVENOUS | Status: AC

## 2021-05-28 MED ORDER — ETOMIDATE 2 MG/ML IV SOLN
INTRAVENOUS | Status: AC
  Administered 2021-05-28: 20 mg
  Filled 2021-05-28: qty 10

## 2021-05-28 MED ORDER — POTASSIUM CHLORIDE 10 MEQ/50ML IV SOLN
10.0000 meq | INTRAVENOUS | Status: AC
  Administered 2021-05-28 (×4): 10 meq via INTRAVENOUS
  Filled 2021-05-28 (×4): qty 50

## 2021-05-28 MED ORDER — POTASSIUM CHLORIDE 20 MEQ PO PACK
20.0000 meq | PACK | ORAL | Status: AC
Start: 2021-05-28 — End: 2021-05-28
  Administered 2021-05-28 (×2): 20 meq
  Filled 2021-05-28 (×2): qty 1

## 2021-05-28 MED ORDER — ETOMIDATE 2 MG/ML IV SOLN: 20.0000 mg | Freq: Once | INTRAVENOUS | Status: AC

## 2021-05-28 MED ORDER — INSULIN ASPART 100 UNIT/ML IJ SOLN
4.0000 [IU] | INTRAMUSCULAR | Status: DC
  Administered 2021-05-28 – 2021-06-04 (×33): 4 [IU] via SUBCUTANEOUS

## 2021-05-28 MED ORDER — METHYLPREDNISOLONE SODIUM SUCC 40 MG IJ SOLR
40.0000 mg | Freq: Two times a day (BID) | INTRAMUSCULAR | Status: AC
  Administered 2021-05-28 (×2): 40 mg via INTRAVENOUS
  Filled 2021-05-28 (×2): qty 1

## 2021-05-28 MED ORDER — DEXMEDETOMIDINE HCL IN NACL 400 MCG/100ML IV SOLN: 0.0000 ug/kg/h | INTRAVENOUS | Status: DC

## 2021-05-28 MED ORDER — PHENYTOIN SODIUM 50 MG/ML IJ SOLN
50.0000 mg | Freq: Two times a day (BID) | INTRAMUSCULAR | Status: AC
  Administered 2021-05-31 – 2021-06-03 (×6): 50 mg via INTRAVENOUS
  Filled 2021-05-28 (×6): qty 2

## 2021-05-28 NOTE — Progress Notes (Signed)
Nutrition Follow-up  DOCUMENTATION CODES:   Obesity unspecified  INTERVENTION:   Tube feeding via Cortrak tube: Jevity 1.2 at 55 ml/h (1320 ml per day) Prosource TF 45 ml TID   Provides 1704 kcal, 106 gm protein, 1070 ml free water daily   NUTRITION DIAGNOSIS:   Inadequate oral intake related to inability to eat as evidenced by NPO status.  Ongoing.   GOAL:   Patient will meet greater than or equal to 90% of their needs  Met with TF.   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  Pt discussed during ICU rounds and with RN. Pt extubated this am but had stridor requiring re-intubation a short time later.  MD discussing plans for trach.  Currently on no sedation but RN has orders for precedex if needed.   8/22 s/p R crani 8/24 cortrak placed; tip gastric, subclinical seizures per EEG 8/25 pt intubated due to ongoing seizures that were increasing   Patient is currently intubated on ventilator support MV: 6.7 L/min Temp (24hrs), Avg:99.2 F (37.3 C), Min:98 F (36.7 C), Max:99.8 F (37.7 C)   Medications reviewed and include: colace, SSI, levemir, MVI with minerals, protonix, miralax  Solu-medrol unit 9/2 Precedex  Labs reviewed:  CBG's: 191-199   Diet Order:   Diet Order     None       EDUCATION NEEDS:   Not appropriate for education at this time  Skin:  Skin Assessment: Reviewed RN Assessment  Last BM:  150 ml via rectal tube  Height:   Ht Readings from Last 1 Encounters:  05/18/21 _0  (1.575 m)    Weight:   Wt Readings from Last 1 Encounters:  05/28/21 86.6 kg    BMI:  Body mass index is 34.92 kg/m.  Estimated Nutritional Needs:   Kcal:  1700-1900  Protein:   100-110 grams  Fluid:  >1.7 L/day  Lockie Pares., RD, LDN, CNSC See AMiON for contact information

## 2021-05-28 NOTE — Procedures (Signed)
Pt quickly developed stridor post extubation. Racepinephrine was given and there was no change with the stridor. CCM MD made the decision to intubate and no complication was noted. Pt is stable at this time. RT will monitor.

## 2021-05-28 NOTE — Progress Notes (Addendum)
Neurosurgery Service Progress Note  Subjective: Extubated this morning and doing well when I was rounding this morning  Objective: Vitals:   05/28/21 0700 05/28/21 0727 05/28/21 0743 05/28/21 0855  BP: 104/60 104/60  108/66  Pulse: 85     Resp: 16     Temp:   99.5 F (37.5 C)   TempSrc:   Axillary   SpO2: 97% 97%    Weight:      Height:        Physical Exam: Isomnolent, but answers simple questions, Fcx4 without obvious preference, stable surgical pupil OD and reactive OS w/ conjugate gaze  Assessment & Plan: 65 y.o. woman s/p resection of planum meningioma, recovering well. 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 Post-op course w/ GTC on POD1, started keppra 8/23, status epilepticus, 8/25 intubation 2/2 status epilepticus, 8/30 extubated and reintubated  -thankfully improving, appreciate CCM and neurology care, no change in neurosurgical plan of care  Judith Part  05/28/21 9:12 AM

## 2021-05-28 NOTE — Progress Notes (Signed)
Patient was extubated this morning, after 10 minutes of being extubated she started with stridor, then he started getting tachypneic and became hypoxic with increasing lethargy.  Decision was made to reintubate using RSI method. Patient tolerated procedure well  Patient's family was updated and discussion is started regarding tracheostomy considering patient was intubated third time during this admission. Will continue with his steroid.    Jacky Kindle MD [facility] See Amion for pager If no response to pager, please call [phone] until 7pm After 7pm, Please call E-link [phone]

## 2021-05-28 NOTE — Progress Notes (Signed)
OT Cancellation Note  Patient Details Name: Olivia Shah MRN: [id number] DOB: 1956/01/10   Cancelled Treatment:    Reason Eval/Treat Not Completed: Medical issues which prohibited therapy. Pt extubated, but subsequently reintubated.  Will check back.  Nilsa Nutting., OTR/L Acute Rehabilitation Services Pager [phone] Office [phone]   Lucille Passy M 05/28/2021, 10:26 AM

## 2021-05-28 NOTE — Progress Notes (Signed)
[facility] ADULT ICU REPLACEMENT PROTOCOL   The patient does apply for the [facility] Adult ICU Electrolyte Replacment Protocol based on the criteria listed below:   1.Exclusion criteria: TCTS patients, ECMO patients and Hypothermia Protocol, and   Dialysis patients 2. Is GFR >/= 30 ml/min? Yes.    Patient's GFR today is > 60 3. Is SCr </= 2? Yes.   Patient's SCr is 0.58 mg/dL 4. Did SCr increase >/= 0.5 in 24 hours? No. 5.Pt's weight >40kg  Yes.   6. Abnormal electrolyte(s):  K 3.1, Mg 1.8  7. Electrolytes replaced per protocol 8.  Call MD STAT for K+ </= 2.5, Phos </= 1, or Mag </= 1 Physician:  Lowella Bandy R Khyrin Trevathan 05/28/2021 6:37 AM

## 2021-05-28 NOTE — Procedures (Addendum)
Extubation Procedure Note  Patient Details:   Name: Olivia Shah DOB: 11/09/1955 MRN: [id number]   Airway Documentation:    Vent end date: 05/28/21 Vent end time: 0855   Evaluation  O2 sats: stable throughout Complications: No apparent complications Patient did tolerate procedure well. Bilateral Breath Sounds: Clear, Diminished   Yes  Pt was extubated per MD order and placed on 3 L [place]. No cuff leak was heard prior to extubation, however CCM MD was at bedside to witness. Pt was alert and following commands. Pt was stable vitals at this time. RT and MD outside room to monitor.   Ronaldo Miyamoto 05/28/2021, 9:30 AM

## 2021-05-28 NOTE — Procedures (Signed)
Intubation Procedure Note  Olivia Shah  [id number]  1956-04-30  Date:05/28/21  Time:11:00 AM   Provider Performing:Gresham Caetano    Procedure: Intubation (31500)  Indication(s) Respiratory Failure  Consent Risks of the procedure as well as the alternatives and risks of each were explained to the patient and/or caregiver.  Consent for the procedure was obtained and is signed in the bedside chart   Anesthesia Etomidate and Rocuronium   Time Out Verified patient identification, verified procedure, site/side was marked, verified correct patient position, special equipment/implants available, medications/allergies/relevant history reviewed, required imaging and test results available.   Sterile Technique Usual hand hygeine, masks, and gloves were used   Procedure Description Patient positioned in bed supine.  Sedation given as noted above.  Patient was intubated with endotracheal tube using  MAC 4 .  View was Grade 1 full glottis .  Number of attempts was 1.  Colorimetric CO2 detector was consistent with tracheal placement.   Complications/Tolerance None; patient tolerated the procedure well. Chest X-ray is ordered to verify placement.   EBL Minimal   Specimen(s) None

## 2021-05-28 NOTE — Progress Notes (Signed)
NAME:  Olivia Shah, MRN:  [id number], DOB:  [birth year], LOS: 21 ADMISSION DATE:  05/18/2021, CONSULTATION DATE:  05/20/21 REFERRING MD:  Zada Finders, CHIEF COMPLAINT:  AMS, seizures   History of Present Illness:  Olivia Shah is a 65 y.o. female who has PMH including congenital right-sided blindness, HTN, DM, arthritis.  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  She was taken to the OR for right pterional craniotomy which was successful.   The following morning, she was noted to have a 30 second GTC seizure that resolved spontaneously.  She then returned to baseline; however, she was started on Keppra 1g twice daily as prophylaxis.  Unfortunately, later that morning, she had recurrent seizure lasting about 1 minute again resolving spontaneously.   EEG 8/24 AM demonstrated cortical dysfunction in the right frontotemporal region, moderate diffuse encephalopathy, no seizures or definite epileptiform discharges. EEG 8/24 PM was reviewed and showed 5 seizures arising in the right hemisphere which were subclinical.   Due to recurrent seizures, PCCM asked to see in consultation  Pertinent  Medical History  Meningioma HTN DM  Significant Hospital Events: Including procedures, antibiotic start and stop dates in addition to other pertinent events   8/22 > OR for right craniotomy. 8/23 > 2 separate seizures that resolved spontaneously. 8/24 > subclinical seizures (5 noted on EEG). 8/25 > frequency of seizures increasing despite keppra and phenytoin, started vimpat and phenobarbital, Intubated for GCS of 7. 8/30: Extubated, failed, reintubated  Interim History / Subjective:  No overnight issues, patient remained afebrile This morning she is tolerating spontaneous breathing trial and following commands in all 4 extremities  Objective   Blood pressure 104/60, pulse 85, temperature 99.5 F (37.5 C), temperature source  Axillary, resp. rate 16, height '5\' 2"'$  (1.575 m), weight 86.6 kg, SpO2 97 %.    Vent Mode: PSV;CPAP FiO2 (%):  [40 %] 40 % Set Rate:  [16 bmp] 16 bmp Vt Set:  [400 mL] 400 mL PEEP:  [5 cmH20] 5 cmH20 Pressure Support:  [8 cmH20] 8 cmH20 Plateau Pressure:  [15 cmH20-16 cmH20] 16 cmH20   Intake/Output Summary (Last 24 hours) at 05/28/2021 0856 Last data filed at 05/28/2021 0600 Gross per 24 hour  Intake 2676.62 ml  Output 2500 ml  Net 176.62 ml   Filed Weights   05/26/21 0500 05/27/21 0414 05/28/21 0500  Weight: 88.7 kg 85.6 kg 86.6 kg    Examination:   Physical exam: General: Crtitically ill-appearing elderly African-American female, orally intubated HEENT: Craniotomy scar looks clean and dry, t right orbit swelling is decreasing, moist mucous membranes Neuro: Opens eyes with vocal stimuli, following simple commands, moving all 4 extremities Chest: Reduced air entry at the bases, no wheezes or rhonchi Heart: Regular rate and rhythm, no murmurs or gallops Abdomen: Soft, nontender, nondistended, bowel sounds present Skin: No rash   Resolved Hospital Problem list     Assessment & Plan:   Acute encephalopathy due to seizures Status epilepticus, resolved Acute hypoxic respiratory failure due to status epilepticus 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 Patient failed extubation trial on 8/30 Patient tolerated spontaneous breathing trial this morning, extubated again So far tolerating well Neurology recommend continuing Keppra, Vimpat and phenytoin Avoid sedation with RASS goal 0/-1  Probable aspiration pneumonia Patient started spiking fever with increasing white count Respiratory culture is growing diphtheroids Continue Unasyn to complete 5 days therapy  DM2 with Hyperglycemia Still blood sugars are  are better now Continue Levemir to 10 units twice daily, continue sliding scale insulin Holding metformin Monitor fingerstick with goal  140-180  Hypertension Blood pressure remained stable Continue home meds  Best Practice (right click and "Reselect all SmartList Selections" daily)   Diet/type: tubefeeds DVT prophylaxis: prophylactic heparin  GI prophylaxis: PPI Lines: yes and it is still needed picc R arm placed 8/27 Foley:  N/A Code Status:  full code Last date of multidisciplinary goals of care discussion: per primary  Labs   CBC: Recent Labs  Lab 05/22/21 0512 05/23/21 0426 05/24/21 0537 05/25/21 0516 05/28/21 0509  WBC 8.5 7.8 10.8* 11.9* 9.7  HGB 10.6* 10.7* 9.6* 9.4* 8.3*  HCT 32.6* 34.1* 30.5* 28.3* 25.7*  MCV 87.9 90.7 88.7 85.2 89.2  PLT 235 238 246 244 [id number]    Basic Metabolic Panel: Recent Labs  Lab 05/21/21 1650 05/22/21 0512 05/23/21 0426 05/24/21 0537 05/25/21 0516 05/28/21 0509  NA 141 140 139 139 136 140  K 3.4* 3.6 4.5 3.9 4.0 3.1*  CL 107 106 106 103 100 111  CO2 '26 25 25 29 28 22  '$ GLUCOSE 227* 256* 243* 139* 175* 179*  BUN 20 24* 29* 21 21 26*  CREATININE 0.74 0.76 0.80 0.54 0.56 0.58  CALCIUM 8.5* 8.0* 7.9* 8.2* 8.4* 6.8*  MG 1.8  --   --   --   --  1.8  PHOS 1.5*  --   --   --   --  2.9   GFR: Estimated Creatinine Clearance: 69.7 mL/min (by C-G formula based on SCr of 0.58 mg/dL). Recent Labs  Lab 05/23/21 0426 05/24/21 0537 05/25/21 0516 05/28/21 0509  WBC 7.8 10.8* 11.9* 9.7    Liver Function Tests: No results for input(s): AST, ALT, ALKPHOS, BILITOT, PROT, ALBUMIN in the last 168 hours.  No results for input(s): LIPASE, AMYLASE in the last 168 hours. No results for input(s): AMMONIA in the last 168 hours.  ABG    Component Value Date/Time   PHART 7.487 (H) 05/21/2021 1548   PCO2ART 38.4 05/21/2021 1548   PO2ART 155 (H) 05/21/2021 1548   HCO3 29.0 (H) 05/21/2021 1548   TCO2 30 05/21/2021 1548   O2SAT 100.0 05/21/2021 1548     Coagulation Profile: No results for input(s): INR, PROTIME in the last 168 hours.  Cardiac Enzymes: No results for  input(s): CKTOTAL, CKMB, CKMBINDEX, TROPONINI in the last 168 hours.  HbA1C: Hgb A1c MFr Bld  Date/Time Value Ref Range Status  05/11/2021 08:31 AM 6.6 (H) 4.8 - 5.6 % Final    Comment:    (NOTE) Pre diabetes:          5.7%-6.4%  Diabetes:              >6.4%  Glycemic control for   <7.0% adults with diabetes     CBG: Recent Labs  Lab 05/27/21 1611 05/27/21 2000 05/27/21 2347 05/28/21 0358 05/28/21 0741  GLUCAP 175* 189* 177* 191* 192*    Total critical care time: 36 minutes  Performed by: [place] care time was exclusive of separately billable procedures and treating other patients.   Critical care was necessary to treat or prevent imminent or life-threatening deterioration.   Critical care was time spent personally by me on the following activities: development of treatment plan with patient and/or surrogate as well as nursing, discussions with consultants, evaluation of patient's response to treatment, examination of patient, obtaining history from patient or surrogate, ordering and performing  treatments and interventions, ordering and review of laboratory studies, ordering and review of radiographic studies, pulse oximetry and re-evaluation of patient's condition.   Jacky Kindle MD [place] [facility] See Amion for pager If no response to pager, please call [phone] until 7pm After 7pm, Please call E-link [phone]

## 2021-05-28 NOTE — Progress Notes (Addendum)
Neurology progress note  Major Interval events/Subjective:  Patient extubated and reintubated for the second day in a row 2/2 respiratory distress. CCM engaging family in discussions re: possible trach. No clinical activity c/f seizure. On examination she is following commands for me for the first time (look L, look R, raise arms (R stronger than L)  ROS: Unable to obtain due to poor mental status  Examination  Vital signs in last 24 hours: Temp:  [98 F (36.7 C)-99.8 F (37.7 C)] 98 F (36.7 C) (09/01 1118) Pulse Rate:  [79-109] 90 (09/01 1056) Resp:  [15-27] 16 (09/01 1000) BP: (96-131)/(58-81) 131/81 (09/01 1000) SpO2:  [94 %-100 %] 95 % (09/01 1056) FiO2 (%):  [40 %] 40 % (09/01 1056) Weight:  [86.6 kg] 86.6 kg (09/01 0500)  General: lying in bed, NAD CVS: pulse-normal rate and rhythm RS: Comfortable on the ventilator without audible wheezing or clear dyssynchrony Extremities: normal, warm  Neuro:  Mental status: awake but drowsy, follows simple commands, tracks examiner, looks to left and right on command Speech: intubated Cranial nerves: Right eye postsurgical, left eye sluggishly reactive, corneals intact, face symmetric within limits of ET tube cough intact per nursing, face symmetric Sensory/motor: Withdraws in all 4 extremities. Raises BUE on command, more drift in L than R)  Basic Metabolic Panel: Recent Labs  Lab 05/21/21 1650 05/22/21 0512 05/23/21 0426 05/24/21 0537 05/25/21 0516 05/28/21 0509 05/28/21 1115  NA 141 140 139 139 136 140 141  K 3.4* 3.6 4.5 3.9 4.0 3.1* 4.7  CL 107 106 106 103 100 111  --   CO2 '26 25 25 29 28 22  '$ --   GLUCOSE 227* 256* 243* 139* 175* 179*  --   BUN 20 24* 29* 21 21 26*  --   CREATININE 0.74 0.76 0.80 0.54 0.56 0.58  --   CALCIUM 8.5* 8.0* 7.9* 8.2* 8.4* 6.8*  --   MG 1.8  --   --   --   --  1.8  --   PHOS 1.5*  --   --   --   --  2.9  --      CBC: Recent Labs  Lab 05/22/21 0512 05/23/21 0426 05/24/21 0537  05/25/21 0516 05/28/21 0509 05/28/21 1115  WBC 8.5 7.8 10.8* 11.9* 9.7  --   HGB 10.6* 10.7* 9.6* 9.4* 8.3* 9.9*  HCT 32.6* 34.1* 30.5* 28.3* 25.7* 29.0*  MCV 87.9 90.7 88.7 85.2 89.2  --   PLT 235 238 246 244 316  --     Results for JAZMARI, COLLIE (MRN [id number]) as of 05/23/2021 07:42  Ref. Range 05/22/2021 12:59 05/22/2021 21:39  PHENOBARBITAL Latest Ref Range: 15.0 - 30.0 ug/mL  21.8  Phenytoin Lvl Latest Ref Range: 10.0 - 20.0 ug/mL 13.6     Coagulation Studies: No results for input(s): LABPROT, INR in the last 72 hours.  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 No significant residual masslike enhancement.This study will serve as a new baseline future follow-up imaging. 2. Acute infarcts in the right anterior temporal lobe, inferior frontal lobe, lentiform nucleus and posterior limb of the right internal capsule with mild associated edema. 3. Moderate right greater than left antidependent pneumocephalus (up to 1.8 cm thick) and small volume right-sided extra-axial hemorrhage and/or gelfoam subjacent to the craniotomy (up to 0.9 cm thick) withresulting 3 mm of leftward midline shift and effacement of the right lateral  ventricle.            EEG    05/21/2021 1052 to 05/22/2021 0924:  This study was initially suggestive of focal status epilepticus arising from right frontotemporal region. Patient was intubated and propofol was started on 05/21/2021 at around 1422 after which seizure frequency improved. Last seizure was noted on 05/21/2021 at 1651.  There is also evidence of moderate to severe diffuse encephalopathy, nonspecific etiology but likely due to seizures, sedation.  Patient event button was pressed on 05/21/2021 at 2132 and 05/22/2021 at 0735 during which RN reported facial twitching without concomitant EEG change. However focal motor seizures may not be seen on scalp  EEG.  Therefore clinical correlation is recommended.  EEG appears to be improving compared to previous day.  05/23/2021 - 05/25/21 This study is suggestive of independent non-specific cortical dysfunction in left hemisphere as well as right frontotemporal region. There is also moderate to severe diffuse encephalopathy, nonspecific etiology. No seizures were seen during this study  ASSESSMENT AND PLAN: 65 year old female on the Neurosurgery service, with new onset seizures s/p right pterional craniotomy for meningioma resection which was also complicated by right hemispheric strokes.  Focal motor status is improved with intubation and addition of propofol, susbequently successfully weaned 8/27. Now will wean phenobarb  Focal convulsive status epilepticus, refractory, resolved. Meningioma status post right pterional craniotomy Acute right hemispheric strokes  Hypoproteinemia with hypoalbuminemia   Recommendations -Currently on phenytoin '100mg'$  tid, start wean today: '100mg'$  bid x3 days f/b '50mg'$  bid x3 days then off - Continue keppra '1500mg'$  bid - Continue vimpat '200mg'$  bid - Phenobarbital d/c'd 8/28 - Propofol weaned 8/27 - No indication for LTM EEG at this time, follow clinically  Su Monks, MD [facility] [phone]  If 7pm- 7am, please page neurology on call as listed in [place].  This patient is critically ill and at significant risk of neurological worsening, death and care requires constant monitoring of vital signs, hemodynamics,respiratory and cardiac monitoring, neurological assessment, discussion with family, other specialists and medical decision making of high complexity. I spent 40 minutes of neurocritical care time  in the care of  this patient. This was time spent independent of any time provided by nurse practitioner or PA.  Su Monks, MD [facility] [phone]  If 7pm- 7am, please page neurology on call as listed in [place].

## 2021-05-28 NOTE — Progress Notes (Signed)
MEDICATION RELATED CONSULT NOTE - INITIAL   Pharmacy Consult for Phenytoin Wean Indication: Status Epi  Patient Measurements: Height: _0  (157.5 cm) Weight: 86.6 kg (190 lb 14.7 oz) IBW/kg (Calculated) : 50.1  Medications:  Scheduled:   amLODipine  10 mg Per Tube Daily   chlorhexidine gluconate (MEDLINE KIT)  15 mL Mouth Rinse BID   Chlorhexidine Gluconate Cloth  6 each Topical Q0600   docusate  100 mg Per Tube BID   dorzolamide-timolol  1 drop Left Eye BID   feeding supplement (PROSource TF)  45 mL Per Tube TID   heparin injection (subcutaneous)  5,000 Units Subcutaneous Q8H   irbesartan  150 mg Per Tube Daily   And   hydrochlorothiazide  12.5 mg Per Tube Daily   insulin aspart  0-15 Units Subcutaneous Q4H   insulin detemir  10 Units Subcutaneous BID   latanoprost  1 drop Left Eye QHS   mouth rinse  15 mL Mouth Rinse 10 times per day   methylPREDNISolone (SOLU-MEDROL) injection  40 mg Intravenous Q12H   metoprolol tartrate  100 mg Per Tube QHS   multivitamin with minerals  1 tablet Per Tube Daily   pantoprazole (PROTONIX) IV  40 mg Intravenous QHS   phenytoin (DILANTIN) IV  100 mg Intravenous Q8H   polyethylene glycol  17 g Oral Daily   potassium chloride  20 mEq Per Tube Q4H   sodium chloride flush  10-40 mL Intracatheter Q12H    Assessment: 61 YOF who presented on 8/22 for meningioma craniotomy and resection with post-op new seizure activity noted on 8/23. The patient's seizures were controlled with Vimpat, Keppra, Phenobarb, Phenytoin, and Propofol. Since then, phenobarb has been discontinued and propofol weaned off. Pharmacy consulted to wean phenytoin.   Given the short time of phenytoin use (~1 week) - will plan to drop the dose down every 72h until off - discussed plan with neurology Quinn Axe)  Plan:  - Reduce Phenytoin to 100 mg IV q12h for 72h then reduce to 50 mg IV q12h for 72h, then OFF - Monitor for seizure activity and need to titrate back up if occurs -  Pharmacy will sign off - please re-consult [place] if needed  Thank you for allowing pharmacy to be a part of this patient's care.  Alycia Rossetti, PharmD, BCPS Clinical Pharmacist Clinical phone for 05/28/2021: [phone] 05/28/2021 1:07 PM   **Pharmacist phone directory can now be found on [email or web address] ([email or web address]).  Listed under [place].

## 2021-05-29 ENCOUNTER — Inpatient Hospital Stay (HOSPITAL_COMMUNITY): Payer: Medicare Other

## 2021-05-29 DIAGNOSIS — D496 Neoplasm of unspecified behavior of brain: Secondary | ICD-10-CM | POA: Diagnosis not present

## 2021-05-29 DIAGNOSIS — J9601 Acute respiratory failure with hypoxia: Secondary | ICD-10-CM

## 2021-05-29 DIAGNOSIS — Z930 Tracheostomy status: Secondary | ICD-10-CM

## 2021-05-29 DIAGNOSIS — G40901 Epilepsy, unspecified, not intractable, with status epilepticus: Secondary | ICD-10-CM | POA: Diagnosis not present

## 2021-05-29 LAB — BASIC METABOLIC PANEL
Anion gap: 9 (ref 5–15)
BUN: 28 mg/dL — ABNORMAL HIGH (ref 8–23)
CO2: 24 mmol/L (ref 22–32)
Calcium: 8.2 mg/dL — ABNORMAL LOW (ref 8.9–10.3)
Chloride: 106 mmol/L (ref 98–111)
Creatinine, Ser: 0.74 mg/dL (ref 0.44–1.00)
GFR, Estimated: >60 mL/min (ref >60)
Glucose, Bld: 94 mg/dL (ref 70–99)
Potassium: 3.9 mmol/L (ref 3.5–5.1)
Sodium: 139 mmol/L (ref 135–145)

## 2021-05-29 LAB — GLUCOSE, CAPILLARY
Glucose-Capillary: 100 mg/dL — ABNORMAL HIGH (ref 70–99)
Glucose-Capillary: 111 mg/dL — ABNORMAL HIGH (ref 70–99)
Glucose-Capillary: 167 mg/dL — ABNORMAL HIGH (ref 70–99)
Glucose-Capillary: 171 mg/dL — ABNORMAL HIGH (ref 70–99)
Glucose-Capillary: 226 mg/dL — ABNORMAL HIGH (ref 70–99)
Glucose-Capillary: 89 mg/dL (ref 70–99)

## 2021-05-29 LAB — PHOSPHORUS: Phosphorus: 4 mg/dL (ref 2.5–4.6)

## 2021-05-29 LAB — MAGNESIUM: Magnesium: 2.3 mg/dL (ref 1.7–2.4)

## 2021-05-29 MED ORDER — VECURONIUM BROMIDE 10 MG IV SOLR
10.0000 mg | Freq: Once | INTRAVENOUS | Status: AC
  Administered 2021-05-29: 10 mg via INTRAVENOUS

## 2021-05-29 MED ORDER — FENTANYL CITRATE PF 50 MCG/ML IJ SOSY
PREFILLED_SYRINGE | INTRAMUSCULAR | Status: AC
  Filled 2021-05-29: qty 4

## 2021-05-29 MED ORDER — ROCURONIUM BROMIDE 10 MG/ML (PF) SYRINGE
PREFILLED_SYRINGE | INTRAVENOUS | Status: AC
  Filled 2021-05-29: qty 10

## 2021-05-29 MED ORDER — ETOMIDATE 2 MG/ML IV SOLN
INTRAVENOUS | Status: AC
  Filled 2021-05-29: qty 10

## 2021-05-29 MED ORDER — MIDAZOLAM HCL 2 MG/2ML IJ SOLN
5.0000 mg | Freq: Once | INTRAMUSCULAR | Status: AC
  Administered 2021-05-29: 4 mg via INTRAVENOUS

## 2021-05-29 MED ORDER — LORAZEPAM 2 MG/ML IJ SOLN: 2.0000 mg | Freq: Four times a day (QID) | INTRAMUSCULAR | Status: DC | PRN

## 2021-05-29 MED ORDER — BACITRACIN ZINC 500 UNIT/GM EX OINT
1.0000 "application " | TOPICAL_OINTMENT | CUTANEOUS | Status: DC | PRN
  Filled 2021-05-29: qty 28.4

## 2021-05-29 MED ORDER — ETOMIDATE 2 MG/ML IV SOLN
40.0000 mg | Freq: Once | INTRAVENOUS | Status: AC
  Administered 2021-05-29: 20 mg via INTRAVENOUS

## 2021-05-29 MED ORDER — VECURONIUM BROMIDE 10 MG IV SOLR
INTRAVENOUS | Status: AC
  Filled 2021-05-29: qty 10

## 2021-05-29 MED ORDER — MIDAZOLAM HCL 2 MG/2ML IJ SOLN
INTRAMUSCULAR | Status: AC
  Filled 2021-05-29: qty 6

## 2021-05-29 MED ORDER — FENTANYL CITRATE PF 50 MCG/ML IJ SOSY
200.0000 ug | PREFILLED_SYRINGE | Freq: Once | INTRAMUSCULAR | Status: AC
Start: 2021-05-29 — End: 2021-05-29
  Administered 2021-05-29: 200 ug via INTRAVENOUS

## 2021-05-29 NOTE — Progress Notes (Signed)
Subjective: No clinical seizures overnight.  Tracheostomy was placed today.  Per RN, prior to tracheostomy placement patient was awake, following commands and moving all 4 extremities  ROS: Unable to obtain due to poor mental status  Examination  Vital signs in last 24 hours: Temp:  [97.6 F (36.4 C)-99 F (37.2 C)] 98.9 F (37.2 C) (09/02 0800) Pulse Rate:  [77-194] 77 (09/02 0800) Resp:  [15-19] 19 (09/02 0800) BP: (94-135)/(62-90) 121/67 (09/02 0800) SpO2:  [76 %-100 %] 100 % (09/02 0800) FiO2 (%):  [40 %] 40 % (09/02 0920) Weight:  [86.8 kg] 86.8 kg (09/02 0500)  General: lying in bed, not in apparent distress CVS: pulse-normal rate and rhythm RS: +trach, coarse breath sounds bilaterally Extremities: warm, spontaneously moving all 4 extremities Neuro: Opens eyes to repeated tactile stimulation, follows simple commands like sticking out her tongue, able to track examiner in room, face appears symmetric, spontaneously moving all extremities L>R  Basic Metabolic Panel: Recent Labs  Lab 05/23/21 0426 05/24/21 0537 05/25/21 0516 05/28/21 0509 05/28/21 1115 05/29/21 0555  NA 139 139 136 140 141 139  K 4.5 3.9 4.0 3.1* 4.7 3.9  CL 106 103 100 111  --  106  CO2 '25 29 28 22  '$ --  24  GLUCOSE 243* 139* 175* 179*  --  94  BUN 29* 21 21 26*  --  28*  CREATININE 0.80 0.54 0.56 0.58  --  0.74  CALCIUM 7.9* 8.2* 8.4* 6.8*  --  8.2*  MG  --   --   --  1.8  --  2.3  PHOS  --   --   --  2.9  --  4.0    CBC: Recent Labs  Lab 05/23/21 0426 05/24/21 0537 05/25/21 0516 05/28/21 0509 05/28/21 1115  WBC 7.8 10.8* 11.9* 9.7  --   HGB 10.7* 9.6* 9.4* 8.3* 9.9*  HCT 34.1* 30.5* 28.3* 25.7* 29.0*  MCV 90.7 88.7 85.2 89.2  --   PLT 238 246 244 316  --      Coagulation Studies: No results for input(s): LABPROT, INR in the last 72 hours.  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 No significant residual masslike enhancement.This study will serve as a new baseline future follow-up imaging. 2. Acute infarcts in the right anterior temporal lobe, inferior frontal lobe, lentiform nucleus and posterior limb of the right internal capsule with mild associated edema. 3. Moderate right greater than left antidependent pneumocephalus (up to 1.8 cm thick) and small volume right-sided extra-axial hemorrhage and/or gelfoam subjacent to the craniotomy (up to 0.9 cm thick) with resulting 3 mm of leftward midline shift and effacement of the right lateral ventricle.   ASSESSMENT AND PLAN:  65 year old female on the Neurosurgery service, with new onset seizures s/p right pterional craniotomy for meningioma resection which was also complicated by right hemispheric strokes.  Focal motor status is improved with intubation and addition of propofol, susbequently successfully weaned 8/27. 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     Recommendations -Weaning phenytoin as follows '100mg'$  bid x3 days f/b '50mg'$  bid x3 days then off - Continue keppra '1500mg'$  bid, vimpat '200mg'$  bid -Continue seizure precautions -As needed IV Ativan 2 mg for clinical seizure-like activity -Management of rest of comorbidities per primary team  I have spent a total of 25  minutes with the patient reviewing hospital notes,  test results, labs and examining the patient as well as establishing an assessment and plan.  > 50% of time was spent in direct patient care.   Zeb Comfort Epilepsy [facility] For questions after 5pm please refer to AMION to reach the Neurologist on call

## 2021-05-29 NOTE — Procedures (Signed)
Diagnostic Bronchoscopy  Olivia Shah  [id number]  13-Dec-1955  Date:05/29/21  Time:9:38 AM   Provider Performing:Hugo Lybrand D Rollene Rotunda   Procedure: Diagnostic Bronchoscopy [phone])  Indication(s) Assist with direct visualization of tracheostomy placement  Consent Risks of the procedure as well as the alternatives and risks of each were explained to the patient and/or caregiver.  Consent for the procedure was obtained.   Anesthesia See separate tracheostomy note   Time Out Verified patient identification, verified procedure, site/side was marked, verified correct patient position, special equipment/implants available, medications/allergies/relevant history reviewed, required imaging and test results available.   Sterile Technique Usual hand hygiene, masks, gowns, and gloves were used   Procedure Description Bronchoscope advanced through endotracheal tube and into airway.  After suctioning out tracheal secretions, bronchoscope used to provide direct visualization of tracheostomy placement.   Complications/Tolerance None; patient tolerated the procedure well.   EBL None  Specimen(s) None  Olivia Shah [facility] 05/29/2021, 9:39 AM  Please see [email or web address] for pager details.  From 7A-7P if no response, please call [phone]. After hours, please call ELink [phone].

## 2021-05-29 NOTE — Procedures (Signed)
Percutaneous Tracheostomy Procedure Note   Olivia Shah  [id number]  [birth year]  Date:05/29/21  Time:9:39 AM   Provider Performing:Nevaen Tredway C Tamala Julian  Procedure: Percutaneous Tracheostomy with Bronchoscopic Guidance ([id number])  Indication(s) Persistent respiratory failure  Consent Risks of the procedure as well as the alternatives and risks of each were explained to the patient and/or caregiver.  Consent for the procedure was obtained.  Anesthesia Etomidate, Versed, Fentanyl, Vecuronium   Time Out Verified patient identification, verified procedure, site/side was marked, verified correct patient position, special equipment/implants available, medications/allergies/relevant history reviewed, required imaging and test results available.   Sterile Technique Maximal sterile technique including sterile barrier drape, hand hygiene, sterile gown, sterile gloves, mask, hair covering.    Procedure Description Appropriate anatomy identified by palpation.  Patient's neck prepped and draped in sterile fashion.  1% lidocaine with epinephrine was used to anesthetize skin overlying neck.  1.5cm incision made and blunt dissection performed until tracheal rings could be easily palpated.   Then a size 6-0 Shiley tracheostomy was placed under bronchoscopic visualization using usual Seldinger technique and serial dilation.   Bronchoscope confirmed placement above the carina.  Tracheostomy was sutured in place with adhesive pad to protect skin under pressure.    Patient connected to ventilator.   Complications/Tolerance None; patient tolerated the procedure well. Chest X-ray is ordered to confirm no post-procedural complication.   EBL Minimal   Specimen(s) None

## 2021-05-29 NOTE — Progress Notes (Signed)
PT Cancellation Note  Patient Details Name: Olivia Shah MRN: [id number] DOB: 27-Dec-1955   Cancelled Treatment:    Reason Eval/Treat Not Completed: Medical issues which prohibited therapy Patient extubated yesterday with subsequent reintubation. Awaiting trach placement. PT will re-attempt as patient becomes appropriate.   Chanelle Hodsdon A. Gilford Rile PT, DPT Acute Rehabilitation Services Pager [phone] Office [phone]   Linna Hoff 05/29/2021, 8:23 AM

## 2021-05-29 NOTE — Progress Notes (Signed)
Niece updated by phone.  Erskine Emery MD PCCM

## 2021-05-29 NOTE — Progress Notes (Signed)
Neurosurgery Service Progress Note  Subjective: Reintubated, required trach placement, which was performed today  Objective: Vitals:   05/29/21 1315 05/29/21 1330 05/29/21 1400 05/29/21 1420  BP: (!) 104/58 100/61 97/62   Pulse: 82 78 85   Resp: '15 15 17   '$ Temp:      TempSrc:      SpO2: 98% 97% 98% 98%  Weight:      Height:        Physical Exam: Trach in place, sedation off, somnolent, but opens eyes to voice and Fcx4 without preference, stable surgical pupil OD and reactive OS w/ conjugate gaze  Assessment & Plan: 65 y.o. woman s/p resection of planum meningioma, recovering well. 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 Post-op course w/ GTC on POD1, started keppra 8/23, status epilepticus, 8/25 intubation 2/2 status epilepticus, 8/30 extubated and reintubated, 9/1 extubated and reintubated (stridor again), trach placed 9/2  -no change in neurosurgical plan of care, f/u neurology / CCM recs, weaning PHT per neurology's recs, cont keppra + vimpat  Judith Part  05/29/21 3:47 PM

## 2021-05-29 NOTE — Progress Notes (Signed)
NAME:  Olivia Shah, MRN:  [id number], DOB:  February 27, 1956, LOS: 54 ADMISSION DATE:  05/18/2021, CONSULTATION DATE:  05/20/21 REFERRING MD:  Zada Finders, CHIEF COMPLAINT:  AMS, seizures   History of Present Illness:  Olivia Shah is a 65 y.o. female who has PMH including congenital right-sided blindness, HTN, DM, arthritis.  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  She was taken to the OR for right pterional craniotomy which was successful.   The following morning, she was noted to have a 30 second GTC seizure that resolved spontaneously.  She then returned to baseline; however, she was started on Keppra 1g twice daily as prophylaxis.  Unfortunately, later that morning, she had recurrent seizure lasting about 1 minute again resolving spontaneously.   EEG 8/24 AM demonstrated cortical dysfunction in the right frontotemporal region, moderate diffuse encephalopathy, no seizures or definite epileptiform discharges. EEG 8/24 PM was reviewed and showed 5 seizures arising in the right hemisphere which were subclinical.   Due to recurrent seizures, PCCM asked to see in consultation  Pertinent  Medical History  Meningioma HTN DM  Significant Hospital Events: Including procedures, antibiotic start and stop dates in addition to other pertinent events   8/22 > OR for right craniotomy. 8/23 > 2 separate seizures that resolved spontaneously. 8/24 > subclinical seizures (5 noted on EEG). 8/25 > frequency of seizures increasing despite keppra and phenytoin, started vimpat and phenobarbital, Intubated for GCS of 7. 8/30: Extubated, failed, reintubated 9/1: Failed extubation again due to stridor, reintubated  Interim History / Subjective:  Failed extubation again on 9/1 principally due to stridor Episodes of significant tachycardia Tolerates PSV, no continuous sedating infusions Phenytoin being weaned by neurology and pharmacy I/O+  3.4 L total   Objective   Blood pressure 121/67, pulse 77, temperature 97.6 F (36.4 C), temperature source Axillary, resp. rate 19, height '5\' 2"'$  (1.575 m), weight 86.8 kg, SpO2 100 %.    Vent Mode: PSV;CPAP FiO2 (%):  [40 %] 40 % Set Rate:  [16 bmp] 16 bmp Vt Set:  [400 mL] 400 mL PEEP:  [5 cmH20] 5 cmH20 Pressure Support:  [8 cmH20] 8 cmH20 Plateau Pressure:  [15 cmH20-17 cmH20] 15 cmH20   Intake/Output Summary (Last 24 hours) at 05/29/2021 [id number] Last data filed at 05/29/2021 0800 Gross per 24 hour  Intake 1908.59 ml  Output 2425 ml  Net -516.41 ml   Filed Weights   05/27/21 0414 05/28/21 0500 05/29/21 0500  Weight: 85.6 kg 86.6 kg 86.8 kg    Examination:   Physical exam: General: Critically ill appearing woman, overweight, intubated HEENT: Craniotomy scar CDI, some residual right orbital edema, ET tube in place, oropharynx clear  Neuro: Opens eyes to voice, pupils equal, follows simple commands, moves all extremities Chest: Decreased to both bases, few scattered rhonchi/secretions, no wheezing Heart: Regular, distant, no murmur Abdomen: Nondistended, positive bowel sounds Skin: No rash  Chest x-ray 9/1 reviewed, shows ET tube directed towards right mainstem, left perihilar right basilar opacities, consider atelectasis  Resolved Hospital Problem list     Assessment & Plan:   Acute encephalopathy due to seizures Status epilepticus, resolved 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  Hopefully will be able to push quickly for PSV and  then ATC once placed -Will attempt to visualize the upper airway with bronchoscope during tracheostomy procedure  Probable aspiration pneumonia -Planning for Unasyn, currently day 4 of 5 -Follow culture data  DM2 with Hyperglycemia -Metformin on hold -Levemir 10 units twice daily -Sliding scale insulin per protocol  Hypertension -Irbesartan/HCTZ, metoprolol, amlodipine per home regimen   Best Practice (right click and "Reselect all SmartList Selections" daily)   Diet/type: tubefeeds DVT prophylaxis: prophylactic heparin  GI prophylaxis: PPI Lines: yes and it is still needed picc R arm placed 8/27 Foley:  N/A Code Status:  full code Last date of multidisciplinary goals of care discussion: per primary  Labs   CBC: Recent Labs  Lab 05/23/21 0426 05/24/21 0537 05/25/21 0516 05/28/21 0509 05/28/21 1115  WBC 7.8 10.8* 11.9* 9.7  --   HGB 10.7* 9.6* 9.4* 8.3* 9.9*  HCT 34.1* 30.5* 28.3* 25.7* 29.0*  MCV 90.7 88.7 85.2 89.2  --   PLT 238 246 244 316  --     Basic Metabolic Panel: Recent Labs  Lab 05/23/21 0426 05/24/21 0537 05/25/21 0516 05/28/21 0509 05/28/21 1115 05/29/21 0555  NA 139 139 136 140 141 139  K 4.5 3.9 4.0 3.1* 4.7 3.9  CL 106 103 100 111  --  106  CO2 '25 29 28 22  '$ --  24  GLUCOSE 243* 139* 175* 179*  --  94  BUN 29* 21 21 26*  --  28*  CREATININE 0.80 0.54 0.56 0.58  --  0.74  CALCIUM 7.9* 8.2* 8.4* 6.8*  --  8.2*  MG  --   --   --  1.8  --  2.3  PHOS  --   --   --  2.9  --  4.0   GFR: Estimated Creatinine Clearance: 69.8 mL/min (by C-G formula based on SCr of 0.74 mg/dL). Recent Labs  Lab 05/23/21 0426 05/24/21 0537 05/25/21 0516 05/28/21 0509  WBC 7.8 10.8* 11.9* 9.7    Liver Function Tests: No results for input(s): AST, ALT, ALKPHOS, BILITOT, PROT, ALBUMIN in the last 168 hours.  No results for input(s): LIPASE, AMYLASE in the last 168 hours. No results for input(s): AMMONIA in the last 168 hours.  ABG    Component Value  Date/Time   PHART 7.441 05/28/2021 1115   PCO2ART 40.3 05/28/2021 1115   PO2ART 63 (L) 05/28/2021 1115   HCO3 27.4 05/28/2021 1115   TCO2 29 05/28/2021 1115   O2SAT 93.0 05/28/2021 1115     Coagulation Profile: No results for input(s): INR, PROTIME in the last 168 hours.  Cardiac Enzymes: No results for input(s): CKTOTAL, CKMB, CKMBINDEX, TROPONINI in the last 168 hours.  HbA1C: Hgb A1c MFr Bld  Date/Time Value Ref Range Status  05/11/2021 08:31 AM 6.6 (H) 4.8 - 5.6 % Final    Comment:    (NOTE) Pre diabetes:          5.7%-6.4%  Diabetes:              >6.4%  Glycemic control for   <7.0% adults with diabetes     CBG: Recent Labs  Lab 05/28/21 1115 05/28/21 1550 05/28/21 2035 05/28/21 2323 05/29/21 0319  GLUCAP 199* 260* 166* 98 100*    Total critical care time: 34 minutes  Performed by:  Baltazar Apo   Critical care time was exclusive of separately billable  procedures and treating other patients.   Critical care was necessary to treat or prevent imminent or life-threatening deterioration.   Critical care was time spent personally by me on the following activities: development of treatment plan with patient and/or surrogate as well as nursing, discussions with consultants, evaluation of patient's response to treatment, examination of patient, obtaining history from patient or surrogate, ordering and performing treatments and interventions, ordering and review of laboratory studies, ordering and review of radiographic studies, pulse oximetry and re-evaluation of patient's condition.    Baltazar Apo, MD, PhD 05/29/2021, 9:24 AM [place] [facility] [phone] or if no answer before 7:00PM call [phone] For any issues after 7:00PM please call eLink [phone]

## 2021-05-30 DIAGNOSIS — Z930 Tracheostomy status: Secondary | ICD-10-CM | POA: Diagnosis not present

## 2021-05-30 DIAGNOSIS — G40901 Epilepsy, unspecified, not intractable, with status epilepticus: Secondary | ICD-10-CM | POA: Diagnosis not present

## 2021-05-30 DIAGNOSIS — J9601 Acute respiratory failure with hypoxia: Secondary | ICD-10-CM | POA: Diagnosis not present

## 2021-05-30 DIAGNOSIS — D496 Neoplasm of unspecified behavior of brain: Secondary | ICD-10-CM | POA: Diagnosis not present

## 2021-05-30 LAB — GLUCOSE, CAPILLARY
Glucose-Capillary: 132 mg/dL — ABNORMAL HIGH (ref 70–99)
Glucose-Capillary: 113 mg/dL — ABNORMAL HIGH (ref 70–99)
Glucose-Capillary: 160 mg/dL — ABNORMAL HIGH (ref 70–99)
Glucose-Capillary: 147 mg/dL — ABNORMAL HIGH (ref 70–99)
Glucose-Capillary: 170 mg/dL — ABNORMAL HIGH (ref 70–99)
Glucose-Capillary: 186 mg/dL — ABNORMAL HIGH (ref 70–99)

## 2021-05-30 LAB — CBC
HCT: 30 % — ABNORMAL LOW (ref 36.0–46.0)
Hemoglobin: 9.4 g/dL — ABNORMAL LOW (ref 12.0–15.0)
MCH: 27.8 pg (ref 26.0–34.0)
MCHC: 31.3 g/dL (ref 30.0–36.0)
MCV: 88.8 fL (ref 80.0–100.0)
Platelets: 485 10*3/uL — ABNORMAL HIGH (ref 150–400)
RBC: 3.38 MIL/uL — ABNORMAL LOW (ref 3.87–5.11)
RDW: 14.8 % (ref 11.5–15.5)
WBC: 13.5 10*3/uL — ABNORMAL HIGH (ref 4.0–10.5)
nRBC: 0.2 % (ref 0.0–0.2)

## 2021-05-30 LAB — BASIC METABOLIC PANEL
Anion gap: 9 (ref 5–15)
BUN: 25 mg/dL — ABNORMAL HIGH (ref 8–23)
CO2: 26 mmol/L (ref 22–32)
Calcium: 8.6 mg/dL — ABNORMAL LOW (ref 8.9–10.3)
Chloride: 107 mmol/L (ref 98–111)
Creatinine, Ser: 0.77 mg/dL (ref 0.44–1.00)
GFR, Estimated: >60 mL/min (ref >60)
Glucose, Bld: 128 mg/dL — ABNORMAL HIGH (ref 70–99)
Potassium: 3.9 mmol/L (ref 3.5–5.1)
Sodium: 142 mmol/L (ref 135–145)

## 2021-05-30 LAB — MAGNESIUM: Magnesium: 2 mg/dL (ref 1.7–2.4)

## 2021-05-30 NOTE — Progress Notes (Signed)
NAME:  Olivia Shah, MRN:  [id number], DOB:  1955-10-27, LOS: 12 ADMISSION DATE:  05/18/2021, CONSULTATION DATE:  05/20/21 REFERRING MD:  Zada Finders, CHIEF COMPLAINT:  AMS, seizures   History of Present Illness:  Olivia Shah is a 65 y.o. female who has PMH including congenital right-sided blindness, HTN, DM, arthritis.  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  She was taken to the OR for right pterional craniotomy which was successful.   The following morning, she was noted to have a 30 second GTC seizure that resolved spontaneously.  She then returned to baseline; however, she was started on Keppra 1g twice daily as prophylaxis.  Unfortunately, later that morning, she had recurrent seizure lasting about 1 minute again resolving spontaneously.   EEG 8/24 AM demonstrated cortical dysfunction in the right frontotemporal region, moderate diffuse encephalopathy, no seizures or definite epileptiform discharges. EEG 8/24 PM was reviewed and showed 5 seizures arising in the right hemisphere which were subclinical.   Due to recurrent seizures, PCCM asked to see in consultation  Pertinent  Medical History  Meningioma HTN DM  Significant Hospital Events: Including procedures, antibiotic start and stop dates in addition to other pertinent events   8/22 > OR for right craniotomy. 8/23 > 2 separate seizures that resolved spontaneously. 8/24 > subclinical seizures (5 noted on EEG). 8/25 > frequency of seizures increasing despite keppra and phenytoin, started vimpat and phenobarbital, Intubated for GCS of 7. 8/30: Extubated, failed, reintubated 9/1: Failed extubation again due to stridor, reintubated 9/2 tracheostomy placed  Interim History / Subjective:   Tracheostomy placed yesterday 9/2 I/O+ 4.1 L total Off continuous sedation 0.40, PEEP 5   Objective   Blood pressure 111/70, pulse 80, temperature 98.8 F (37.1 C),  temperature source Oral, resp. rate 16, height '5\' 2"'$  (1.575 m), weight 87.9 kg, SpO2 100 %.    Vent Mode: PRVC FiO2 (%):  [40 %] 40 % Set Rate:  [12 bmp-16 bmp] 12 bmp Vt Set:  [400 mL] 400 mL PEEP:  [5 cmH20] 5 cmH20 Plateau Pressure:  [14 cmH20-15 cmH20] 15 cmH20   Intake/Output Summary (Last 24 hours) at 05/30/2021 0751 Last data filed at 05/30/2021 0600 Gross per 24 hour  Intake 2350.44 ml  Output 1600 ml  Net 750.44 ml   Filed Weights   05/28/21 0500 05/29/21 0500 05/30/21 0300  Weight: 86.6 kg 86.8 kg 87.9 kg    Examination:   Physical exam: General: Ill-appearing woman in bed HEENT: Craniotomy scar clean and dry, residual right overall edema.  Trach in good position Neuro: Eyes are open, nods to questions, follows commands, moderate cough strength.  Spontaneous dry debris Chest: Decreased in both bases but no wheezes or crackles Heart: Distant, regular Abdomen: Nondistended with positive bowel sounds Extremities: No edema Skin: No rash  Chest x-ray 9/2 reviewed by me.  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  Will be careful with PMV and capping trials  Probable aspiration pneumonia -Planning for Unasyn, currently day 5 of 5.  End today -Follow culture data >>  diphtheroids  DM2 with Hyperglycemia -Continue to hold metformin -Levemir 10 units twice daily -Sliding scale insulin moderate protocol  Hypertension -Continue irbesartan/HCTZ, metoprolol, amlodipine per home regimen   Best Practice (right click and "Reselect all SmartList Selections" daily)   Diet/type: tubefeeds DVT prophylaxis: prophylactic heparin  GI prophylaxis: PPI Lines: yes and it is still needed picc R arm placed 8/27 Foley:  N/A Code Status:  full code Last date of multidisciplinary goals of care discussion: per primary  Labs   CBC: Recent Labs  Lab 05/24/21 0537 05/25/21 0516 05/28/21 0509 05/28/21 1115 05/30/21 0500  WBC 10.8* 11.9* 9.7  --  13.5*  HGB 9.6* 9.4* 8.3* 9.9* 9.4*  HCT 30.5* 28.3* 25.7* 29.0* 30.0*  MCV 88.7 85.2 89.2  --  88.8  PLT 246 244 316  --  485*    Basic Metabolic Panel: Recent Labs  Lab 05/24/21 0537 05/25/21 0516 05/28/21 0509 05/28/21 1115 05/29/21 0555 05/30/21 0500  NA 139 136 140 141 139 142  K 3.9 4.0 3.1* 4.7 3.9 3.9  CL 103 100 111  --  106 107  CO2 '29 28 22  '$ --  24 26  GLUCOSE 139* 175* 179*  --  94 128*  BUN 21 21 26*  --  28* 25*  CREATININE 0.54 0.56 0.58  --  0.74 0.77  CALCIUM 8.2* 8.4* 6.8*  --  8.2* 8.6*  MG  --   --  1.8  --  2.3 2.0  PHOS  --   --  2.9  --  4.0  --    GFR: Estimated Creatinine Clearance: 70.2 mL/min (by C-G formula based on SCr of 0.77 mg/dL). Recent Labs  Lab 05/24/21 0537 05/25/21 0516 05/28/21 0509 05/30/21 0500  WBC 10.8* 11.9* 9.7 13.5*    Liver Function Tests: No results for input(s): AST, ALT, ALKPHOS, BILITOT, PROT, ALBUMIN in the last 168 hours.  No results for input(s): LIPASE, AMYLASE in the last 168 hours. No results for input(s): AMMONIA in the last 168 hours.  ABG    Component Value Date/Time   PHART 7.441 05/28/2021 1115   PCO2ART 40.3 05/28/2021 1115   PO2ART 63 (L) 05/28/2021 1115   HCO3 27.4 05/28/2021 1115   TCO2 29 05/28/2021 1115   O2SAT 93.0  05/28/2021 1115     Coagulation Profile: No results for input(s): INR, PROTIME in the last 168 hours.  Cardiac Enzymes: No results for input(s): CKTOTAL, CKMB, CKMBINDEX, TROPONINI in the last 168 hours.  HbA1C: Hgb A1c MFr Bld  Date/Time Value Ref Range Status  05/11/2021 08:31 AM 6.6 (H) 4.8 - 5.6 % Final    Comment:    (NOTE) Pre diabetes:          5.7%-6.4%  Diabetes:              >6.4%  Glycemic control for   <7.0% adults with diabetes     CBG: Recent Labs  Lab 05/29/21 1148 05/29/21 1546 05/29/21 1958 05/29/21 2338 05/30/21 0334  GLUCAP 111* 171* 167* 226* 132*    Total critical care time: 32 minutes  Performed by:  Baltazar Apo   Critical care time was exclusive of separately billable procedures and treating other patients.   Critical care was necessary to treat or prevent imminent or life-threatening  deterioration.   Critical care was time spent personally by me on the following activities: development of treatment plan with patient and/or surrogate as well as nursing, discussions with consultants, evaluation of patient's response to treatment, examination of patient, obtaining history from patient or surrogate, ordering and performing treatments and interventions, ordering and review of laboratory studies, ordering and review of radiographic studies, pulse oximetry and re-evaluation of patient's condition.    Baltazar Apo, MD, PhD 05/30/2021, 7:51 AM [place] [facility] [phone] or if no answer before 7:00PM call [phone] For any issues after 7:00PM please call eLink [phone]

## 2021-05-30 NOTE — Progress Notes (Signed)
Subjective: The patient is alert, trached, and in no apparent distress.  Objective: Vital signs in last 24 hours: Temp:  [98.7 F (37.1 C)-99.7 F (37.6 C)] 98.7 F (37.1 C) (09/03 0800) Pulse Rate:  [68-98] 81 (09/03 0900) Resp:  [12-32] 16 (09/03 0900) BP: (83-135)/(56-82) 116/82 (09/03 0900) SpO2:  [96 %-100 %] 100 % (09/03 0900) FiO2 (%):  [40 %] 40 % (09/03 0845) Weight:  [87.9 kg] 87.9 kg (09/03 0300) Estimated body mass index is 35.44 kg/m as calculated from the following:   Height as of this encounter: '5\' 2"'$  (1.575 m).   Weight as of this encounter: 87.9 kg.   Intake/Output from previous day: 09/02 0701 - 09/03 0700 In: 2415.4 [I.V.:230.1; NG/GT:1155; IV Piggyback:1030.3] Out: 1600 [Urine:1600] Intake/Output this shift: Total I/O In: 401 [I.V.:17.3; NG/GT:140; IV Piggyback:243.8] Out: -   Physical exam the patient is alert and follows commands.  Lab Results: Recent Labs    05/28/21 0509 05/28/21 1115 05/30/21 0500  WBC 9.7  --  13.5*  HGB 8.3* 9.9* 9.4*  HCT 25.7* 29.0* 30.0*  PLT 316  --  485*   BMET Recent Labs    05/29/21 0555 05/30/21 0500  NA 139 142  K 3.9 3.9  CL 106 107  CO2 24 26  GLUCOSE 94 128*  BUN 28* 25*  CREATININE 0.74 0.77  CALCIUM 8.2* 8.6*    Studies/Results: DG Chest Port 1 View  Result Date: 05/29/2021 CLINICAL DATA:  Status post tracheostomy. EXAM: PORTABLE CHEST 1 VIEW COMPARISON:  May 28, 2021. FINDINGS: Stable cardiomediastinal silhouette. Tracheostomy and feeding tubes are in grossly good position. No pneumothorax is noted. Left perihilar opacity noted on prior exam is no longer visualized. Stable right basilar atelectasis or infiltrate is noted. Bony thorax is unremarkable. IMPRESSION: Tracheostomy and feeding tubes are in grossly good position. Stable right basilar atelectasis or infiltrate. Electronically Signed   By: Marijo Conception M.D.   On: 05/29/2021 10:10   DG CHEST PORT 1 VIEW  Result Date:  05/28/2021 CLINICAL DATA:  Intubation. EXAM: PORTABLE CHEST 1 VIEW COMPARISON:  May 26, 2021. FINDINGS: Stable cardiomediastinal silhouette. Feeding tube is seen entering stomach. Endotracheal tube appears to be directed slightly into right mainstem bronchus; withdrawal by 2-3 cm is recommended. No pneumothorax is noted. Left perihilar and right basilar opacities are noted concerning for atelectasis or possibly infiltrates. Bony thorax is unremarkable. IMPRESSION: Endotracheal tube appears to be directed slightly into right mainstem bronchus; withdrawal by 2-3 cm is recommended. Left perihilar and right basilar opacities are noted concerning for atelectasis or possibly infiltrates. These results will be called to the ordering clinician or representative by the Radiologist Assistant, and communication documented in the PACS or zVision Dashboard. Electronically Signed   By: Marijo Conception M.D.   On: 05/28/2021 10:32    Assessment/Plan: Postop day 12: The patient is neurologically stable  Respiratory distress/stridor: The patient is trached.  LOS: 12 days     [name] 05/30/2021, 9:25 AM     Patient ID: Olivia Shah, female   DOB: 1955-11-05, 65 y.o.   MRN: [id number]

## 2021-05-30 NOTE — Progress Notes (Signed)
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 Intubated 8/27. Attempt to extubate 8/30 unsuccessful. Reintubated 8/30. Trach placed 9/2. PMH: congenitally blind OD, arthritis, DM HTN    PT Comments    Patient sleepy but arousable to auditory stimuli on arrival. RN present to assist. Placed patient in chair position. Patient demos poor trunk and neck control with progression to chair position. Patient able to extend neck on command. Patient following ~50% of commands with increased time. Patient able to move ankles but unable to extend knees bilaterally. Recommend SNF following discharge to maximize functional mobility and safety.     Follow Up Recommendations  SNF     Equipment Recommendations  Other (comment) (TBD pending progress)    Recommendations for Other Services       Precautions / Restrictions Precautions Precautions: Fall Precaution Comments: seizures, cortrak, trach, blind in R eye Restrictions Weight Bearing Restrictions: No    Mobility  Bed Mobility Overal bed mobility: Needs Assistance Bed Mobility: Rolling Rolling: Total assist;+2 for physical assistance         General bed mobility comments: totalA+2 to roll towards L to place pillow. Brought patient into chair position x ~10 minutes. Performing reaching with R UE but undershooting. Difficult to tell if visual deficits or UE strength deficits. Patient able to move ankles and toes but unable to extend bilateral knees. Worked on cervical extension and trunk extension due to poor postural control.    Transfers                    Ambulation/Gait                  Stairs             Wheelchair Mobility    Modified Rankin (Stroke Patients Only) Modified Rankin (Stroke Patients Only) Pre-Morbid Rankin Score: No symptoms Modified Rankin: Severe disability     Balance Overall balance assessment: Needs assistance Sitting-balance support: No upper extremity supported;Feet unsupported Sitting balance-Leahy Scale: Zero Sitting balance - Comments: unable to bring patient into unsupported sitting due to poor postural control in supported sitting. Patient with immediate trunk flexion with placing into chair position                                    Cognition Arousal/Alertness: Lethargic Behavior During Therapy: Flat affect Overall Cognitive Status: Difficult to assess                                 General Comments: Following 50% of commands with increased time. Nodding head yes/no to questions asked      Exercises      General Comments General comments (skin integrity, edema, etc.): VSS on trach 40% FiO2 and 5 PEEP      Pertinent Vitals/Pain Pain Assessment: Faces Faces Pain Scale: No hurt Pain Intervention(s): Monitored during session    Home Living                      Prior Function  PT Goals (current goals can now be found in the care plan section) Acute Rehab PT Goals Patient Stated Goal: unable to state PT Goal Formulation: Patient unable to participate in goal setting Time For Goal Achievement: 06/10/21 Potential to Achieve Goals: Fair Progress towards PT goals: Progressing toward goals    Frequency    Min 3X/week      PT Plan Current plan remains appropriate    Co-evaluation              AM-PAC PT "6 Clicks" Mobility   Outcome Measure  Help needed turning from your back to your side while in a flat bed without using bedrails?: Total Help needed moving from lying on your back to sitting on the side of a flat bed without using bedrails?:  Total Help needed moving to and from a bed to a chair (including a wheelchair)?: Total Help needed standing up from a chair using your arms (e.g., wheelchair or bedside chair)?: Total Help needed to walk in hospital room?: Total Help needed climbing 3-5 steps with a railing? : Total 6 Click Score: 6    End of Session Equipment Utilized During Treatment: Oxygen Activity Tolerance: Patient tolerated treatment well Patient left: in bed;with call bell/phone within reach Nurse Communication: Mobility status PT Visit Diagnosis: Unsteadiness on feet (R26.81);Muscle weakness (generalized) (M62.81);Difficulty in walking, not elsewhere classified (R26.2);Other abnormalities of gait and mobility (R26.89);Other symptoms and signs involving the nervous system (R29.898)     Time: 1445-1500 PT Time Calculation (min) (ACUTE ONLY): 15 min  Charges:  $Therapeutic Exercise: 8-22 mins                     Olivia Shah PT, DPT Acute Rehabilitation Services Pager [phone] Office [phone]    Olivia Shah 05/30/2021, 3:24 PM

## 2021-05-30 NOTE — Progress Notes (Signed)
Neurology Progress Note  Subjective: No clinical seizures overnight.  Trach placed yesterday. Asleep but arousable on exam, follows commands in all extremities.   ROS: Unable to obtain due to poor mental status  Examination  Vital signs in last 24 hours: Temp:  [98.7 F (37.1 C)-99.7 F (37.6 C)] 98.7 F (37.1 C) (09/03 0800) Pulse Rate:  [73-98] 73 (09/03 1400) Resp:  [9-23] 14 (09/03 1400) BP: (87-124)/(56-82) 110/67 (09/03 1400) SpO2:  [96 %-100 %] 96 % (09/03 1414) FiO2 (%):  [40 %] 40 % (09/03 1414) Weight:  [87.9 kg] 87.9 kg (09/03 0300)  General: lying in bed, not in apparent distress CVS: pulse-normal rate and rhythm RS: +trach, coarse breath sounds bilaterally Extremities: warm, spontaneously moving all 4 extremities Neuro: Opens eyes to repeated tactile stimulation, follows simple commands like sticking out her tongue, able to track examiner in room, face appears symmetric, spontaneously moving all extremities L>R. Squeezes bilat hands on command.  Basic Metabolic Panel: Recent Labs  Lab 05/24/21 0537 05/25/21 0516 05/28/21 0509 05/28/21 1115 05/29/21 0555 05/30/21 0500  NA 139 136 140 141 139 142  K 3.9 4.0 3.1* 4.7 3.9 3.9  CL 103 100 111  --  106 107  CO2 '29 28 22  '$ --  24 26  GLUCOSE 139* 175* 179*  --  94 128*  BUN 21 21 26*  --  28* 25*  CREATININE 0.54 0.56 0.58  --  0.74 0.77  CALCIUM 8.2* 8.4* 6.8*  --  8.2* 8.6*  MG  --   --  1.8  --  2.3 2.0  PHOS  --   --  2.9  --  4.0  --      CBC: Recent Labs  Lab 05/24/21 0537 05/25/21 0516 05/28/21 0509 05/28/21 1115 05/30/21 0500  WBC 10.8* 11.9* 9.7  --  13.5*  HGB 9.6* 9.4* 8.3* 9.9* 9.4*  HCT 30.5* 28.3* 25.7* 29.0* 30.0*  MCV 88.7 85.2 89.2  --  88.8  PLT 246 244 316  --  485*      Coagulation Studies: No results for input(s): LABPROT, INR in the last 72 hours.  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 No significant residual masslike enhancement.This study will serve as a new baseline future follow-up imaging. 2. Acute infarcts in the right anterior temporal lobe, inferior frontal lobe, lentiform nucleus and posterior limb of the right internal capsule with mild associated edema. 3. Moderate right greater than left antidependent pneumocephalus (up to 1.8 cm thick) and small volume right-sided extra-axial hemorrhage and/or gelfoam subjacent to the craniotomy (up to 0.9 cm thick) with resulting 3 mm of leftward midline shift and effacement of the right lateral ventricle.   ASSESSMENT AND PLAN:  65 year old female on the Neurosurgery service, with new onset seizures s/p right pterional craniotomy for meningioma resection which was also complicated by right hemispheric strokes.  Focal motor status is improved and we are currently weaning AEDs.   Focal convulsive status epilepticus, refractory, resolved. Meningioma status post right pterional craniotomy Acute right hemispheric strokes  -No further seizures overnight.     Recommendations -Weaning phenytoin as follows '100mg'$  bid x3 days f/b '50mg'$  bid x3 days then off - Continue keppra '1500mg'$  bid, vimpat '200mg'$  bid -Continue seizure precautions -As needed IV Ativan 2 mg for clinical seizure-like activity -Management of rest of comorbidities per primary team   Su Monks, MD [facility] [phone]  If 7pm- 7am, please page neurology on call as listed in [place].

## 2021-05-30 NOTE — Progress Notes (Signed)
  Speech Language Pathology  Patient Details Name: Olivia Shah MRN: [id number] DOB: [birth year] Today's Date: 05/30/2021 Time:  -     Received orders for PMV, BSE. Pt tracheotomy yesterday; currently vent on PSV.   Dr. Agustina Caroli note reads: - "Push for PSV and hopefully quickly to ATC today 9/3 -Given her upper airway edema, stridor will need to confirm patent upper airway before we would consider decannulation. Will be careful with PMV and capping trials"        ST will plan to follow up 9/5 as able.      Houston Siren 05/30/2021, 11:16 AM  Orbie Pyo Colvin Caroli.Ed Risk analyst [phone]; weekend [phone] Office [phone]

## 2021-05-31 DIAGNOSIS — J9601 Acute respiratory failure with hypoxia: Secondary | ICD-10-CM | POA: Diagnosis not present

## 2021-05-31 LAB — GLUCOSE, CAPILLARY
Glucose-Capillary: 108 mg/dL — ABNORMAL HIGH (ref 70–99)
Glucose-Capillary: 121 mg/dL — ABNORMAL HIGH (ref 70–99)
Glucose-Capillary: 124 mg/dL — ABNORMAL HIGH (ref 70–99)
Glucose-Capillary: 136 mg/dL — ABNORMAL HIGH (ref 70–99)
Glucose-Capillary: 147 mg/dL — ABNORMAL HIGH (ref 70–99)
Glucose-Capillary: 173 mg/dL — ABNORMAL HIGH (ref 70–99)

## 2021-05-31 MED ORDER — POLYETHYLENE GLYCOL 3350 17 G PO PACK
17.0000 g | PACK | Freq: Every day | ORAL | Status: DC
  Administered 2021-06-03 – 2021-06-04 (×2): 17 g
  Filled 2021-05-31 (×2): qty 1

## 2021-05-31 MED ORDER — PANTOPRAZOLE SODIUM 40 MG PO PACK
40.0000 mg | PACK | Freq: Every day | ORAL | Status: DC
  Administered 2021-05-31 – 2021-06-03 (×3): 40 mg
  Filled 2021-05-31 (×4): qty 20

## 2021-05-31 NOTE — Progress Notes (Signed)
Neurology Progress Note  Subjective: No clinical seizures overnight. Still drowsy but more alert than yesterday. Follows commands in all extremities.  ROS: Unable to obtain due to poor mental status  Examination  Vital signs in last 24 hours: Temp:  [97.7 F (36.5 C)-99.7 F (37.6 C)] 99.6 F (37.6 C) (09/04 1600) Pulse Rate:  [66-98] 98 (09/04 1900) Resp:  [11-30] 25 (09/04 1900) BP: (97-138)/(51-91) 122/77 (09/04 1800) SpO2:  [95 %-100 %] 96 % (09/04 1900) FiO2 (%):  [35 %-40 %] 35 % (09/04 1512) Weight:  [89.5 kg] 89.5 kg (09/04 0400)  General: lying in bed, not in apparent distress CVS: pulse-normal rate and rhythm RS: +trach, coarse breath sounds bilaterally Extremities: warm, spontaneously moving all 4 extremities Neuro: Opens eyes to repeated tactile stimulation, follows simple commands like sticking out her tongue, able to track examiner in room, face appears symmetric, spontaneously moving all extremities L>R. Squeezes bilat hands on command.  Basic Metabolic Panel: Recent Labs  Lab 05/25/21 0516 05/28/21 0509 05/28/21 1115 05/29/21 0555 05/30/21 0500  NA 136 140 141 139 142  K 4.0 3.1* 4.7 3.9 3.9  CL 100 111  --  106 107  CO2 28 22  --  24 26  GLUCOSE 175* 179*  --  94 128*  BUN 21 26*  --  28* 25*  CREATININE 0.56 0.58  --  0.74 0.77  CALCIUM 8.4* 6.8*  --  8.2* 8.6*  MG  --  1.8  --  2.3 2.0  PHOS  --  2.9  --  4.0  --      CBC: Recent Labs  Lab 05/25/21 0516 05/28/21 0509 05/28/21 1115 05/30/21 0500  WBC 11.9* 9.7  --  13.5*  HGB 9.4* 8.3* 9.9* 9.4*  HCT 28.3* 25.7* 29.0* 30.0*  MCV 85.2 89.2  --  88.8  PLT 244 316  --  485*      Coagulation Studies: No results for input(s): LABPROT, INR in the last 72 hours.  Imaging  MRI brain with and without contrast 05/19/2021:  1.Interval right pterional craniotomy and resection of an anterior skull base meningioma. No significant residual masslike enhancement.This study will serve as a new  baseline future follow-up imaging. 2. Acute infarcts in the right anterior temporal lobe, inferior frontal lobe, lentiform nucleus and posterior limb of the right internal capsule with mild associated edema. 3. Moderate right greater than left antidependent pneumocephalus (up to 1.8 cm thick) and small volume right-sided extra-axial hemorrhage and/or gelfoam subjacent to the craniotomy (up to 0.9 cm thick) with resulting 3 mm of leftward midline shift and effacement of the right lateral ventricle.   ASSESSMENT AND PLAN:  65 year old female on the Neurosurgery service, with new onset seizures s/p right pterional craniotomy for meningioma resection which was also complicated by right hemispheric strokes.  Focal motor status is resolved and we are currently weaning AEDs.   Focal convulsive status epilepticus, refractory, resolved. Meningioma status post right pterional craniotomy Acute right hemispheric strokes      Recommendations -Weaning phenytoin as follows '100mg'$  bid x3 days f/b '50mg'$  bid x3 days then off - Continue keppra '1500mg'$  bid, vimpat '200mg'$  bid -Continue seizure precautions -As needed IV Ativan 2 mg for clinical seizure-like activity -Management of rest of comorbidities per primary team   Su Monks, MD [facility] [phone]  If 7pm- 7am, please page neurology on call as listed in [place].

## 2021-05-31 NOTE — Progress Notes (Signed)
Pt placed on trach. collar per MD. Pt currently tolerating well, Vitals stable, spo2 100%. MD aware, RT will continue to monitor.   05/31/21 1357  Respiratory Assessment  Assessment Type Assess only  Respiratory Pattern Regular;Unlabored  Chest Assessment Chest expansion symmetrical  Cough Productive  Sputum Amount Small  Sputum Color Pink tinged;Clear  Sputum Consistency Thin  Sputum Specimen Source Tracheostomy tube  Bilateral Breath Sounds Clear;Diminished  Oxygen Therapy/Pulse Ox  O2 Device (S)  Tracheostomy Collar (per MD)  O2 Therapy Oxygen humidified  O2 Flow Rate (L/min) 8 L/min  FiO2 (%) 35 %  SpO2 100 %  Tracheostomy Shiley Flexible 6 mm Cuffed  Placement Date/Time: 05/29/21 0940   Brand: Shiley Flexible  Size (mm): 6 mm  Style: Cuffed  Status Secured  Site Assessment Clean;Dry  Ties Assessment Clean;Dry;Secure  Cuff Pressure (cm H2O) Green OR 18-26 CmH2O  Tracheostomy Equipment at bedside Yes and checklist posted at head of bed

## 2021-05-31 NOTE — Progress Notes (Signed)
   Providing Compassionate, Quality Care - Together  NEUROSURGERY PROGRESS NOTE   S: No issues overnight.   O: EXAM:  BP 124/78   Pulse 68   Temp 97.7 F (36.5 C) (Axillary)   Resp 13   Ht '5\' 2"'$  (1.575 m)   Wt 89.5 kg   SpO2 100%   BMI 36.09 kg/m    Sleepy, arouses to voice PERRL Moving all extremities Incision clean dry and intact, healing well Trach in place Follows commands  ASSESSMENT:  65 y.o. female with  Planum meningioma  Status post resection  PLAN: -Continue supportive care -Continue antiepileptics    Thank you for allowing me to participate in this patient's care.  Please do not hesitate to call with questions or concerns.   Elwin Sleight, [facility] Cell: [phone]

## 2021-05-31 NOTE — Progress Notes (Signed)
NAME:  Olivia Shah, MRN:  [id number], DOB:  03-21-1956, LOS: 10 ADMISSION DATE:  05/18/2021, CONSULTATION DATE:  05/20/21 REFERRING MD:  Zada Finders, CHIEF COMPLAINT:  AMS, seizures   History of Present Illness:  Olivia Shah is a 65 y.o. female who has PMH including congenital right-sided blindness, HTN, DM, arthritis.  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  She was taken to the OR for right pterional craniotomy which was successful.   The following morning, she was noted to have a 30 second GTC seizure that resolved spontaneously.  She then returned to baseline; however, she was started on Keppra 1g twice daily as prophylaxis.  Unfortunately, later that morning, she had recurrent seizure lasting about 1 minute again resolving spontaneously.   EEG 8/24 AM demonstrated cortical dysfunction in the right frontotemporal region, moderate diffuse encephalopathy, no seizures or definite epileptiform discharges. EEG 8/24 PM was reviewed and showed 5 seizures arising in the right hemisphere which were subclinical.   Due to recurrent seizures, PCCM asked to see in consultation  Pertinent  Medical History  Meningioma HTN DM  Significant Hospital Events: Including procedures, antibiotic start and stop dates in addition to other pertinent events   8/22 > OR for right craniotomy. 8/23 > 2 separate seizures that resolved spontaneously. 8/24 > subclinical seizures (5 noted on EEG). 8/25 > frequency of seizures increasing despite keppra and phenytoin, started vimpat and phenobarbital, Intubated for GCS of 7. 8/30: Extubated, failed, reintubated 9/1: Failed extubation again due to stridor, reintubated 9/2 tracheostomy placed  Interim History / Subjective:   Working with PT Weaning phenytoin, continuing Keppra and Vimpat I/O + 6L total Tolerated PSV for much of the day on 9/3  Objective   Blood pressure (!) 110/53, pulse  74, temperature 98.9 F (37.2 C), temperature source Oral, resp. rate 16, height '5\' 2"'$  (1.575 m), weight 89.5 kg, SpO2 100 %.    Vent Mode: PRVC FiO2 (%):  [40 %] 40 % Set Rate:  [12 bmp] 12 bmp Vt Set:  [400 mL] 400 mL PEEP:  [5 cmH20] 5 cmH20 Pressure Support:  [10 cmH20] 10 cmH20 Plateau Pressure:  [9 cmH20-13 cmH20] 13 cmH20   Intake/Output Summary (Last 24 hours) at 05/31/2021 0746 Last data filed at 05/31/2021 0600 Gross per 24 hour  Intake 2511.41 ml  Output 700 ml  Net 1811.41 ml   Filed Weights   05/29/21 0500 05/30/21 0300 05/31/21 0400  Weight: 86.8 kg 87.9 kg 89.5 kg    Examination:   Physical exam: General: Ill-appearing woman in bed HEENT: Craniotomy scar clean and dry, residual right orbital edema, trach CDI Neuro: A bit more sleepy, lethargic today.  She did open eyes to voice, nodded to questions.  Globally weak.  She does have a spontaneous drive to breathe Chest: Decreased bilaterally, no wheezes or crackles Heart: Distant regular Abdomen: Nondistended with positive bowel sounds Extremities: No edema Skin: No rash  Chest x-ray 9/2 reviewed by me.  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  We will continue 9/4 and try to push for ATC, hopefully liberate from MV -Pulmonary hygiene -Upper airway edema and stridor on previous extubation.  Will have to be careful with PMV and capping.  Question whether  she may need an upper airway inspection if no cuff leak as we progress  Probable aspiration pneumonia -completed 5 days Unasyn on 9/3 -Culture data > diphtheroids  DM2 with Hyperglycemia -Metformin is on hold -Levemir 10 units twice daily -Sliding-scale insulin moderate protocol  Hypertension -Continue irbesartan/HCTZ, metoprolol, amlodipine per home regimen   Best Practice (right click and "Reselect all SmartList Selections" daily)   Diet/type: tubefeeds SLP evaluation DVT prophylaxis: prophylactic heparin  GI prophylaxis: PPI Lines: yes and it is still needed picc R arm placed 8/27 Foley:  N/A Code Status:  full code Last date of multidisciplinary goals of care discussion: per primary  Labs   CBC: Recent Labs  Lab 05/25/21 0516 05/28/21 0509 05/28/21 1115 05/30/21 0500  WBC 11.9* 9.7  --  13.5*  HGB 9.4* 8.3* 9.9* 9.4*  HCT 28.3* 25.7* 29.0* 30.0*  MCV 85.2 89.2  --  88.8  PLT 244 316  --  485*    Basic Metabolic Panel: Recent Labs  Lab 05/25/21 0516 05/28/21 0509 05/28/21 1115 05/29/21 0555 05/30/21 0500  NA 136 140 141 139 142  K 4.0 3.1* 4.7 3.9 3.9  CL 100 111  --  106 107  CO2 28 22  --  24 26  GLUCOSE 175* 179*  --  94 128*  BUN 21 26*  --  28* 25*  CREATININE 0.56 0.58  --  0.74 0.77  CALCIUM 8.4* 6.8*  --  8.2* 8.6*  MG  --  1.8  --  2.3 2.0  PHOS  --  2.9  --  4.0  --    GFR: Estimated Creatinine Clearance: 71 mL/min (by C-G formula based on SCr of 0.77 mg/dL). Recent Labs  Lab 05/25/21 0516 05/28/21 0509 05/30/21 0500  WBC 11.9* 9.7 13.5*    Liver Function Tests: No results for input(s): AST, ALT, ALKPHOS, BILITOT, PROT, ALBUMIN in the last 168 hours.  No results for input(s): LIPASE, AMYLASE in the last 168 hours. No results for input(s): AMMONIA in the last 168 hours.  ABG    Component Value Date/Time   PHART 7.441 05/28/2021 1115   PCO2ART 40.3 05/28/2021 1115   PO2ART 63 (L) 05/28/2021 1115   HCO3 27.4 05/28/2021 1115    TCO2 29 05/28/2021 1115   O2SAT 93.0 05/28/2021 1115     Coagulation Profile: No results for input(s): INR, PROTIME in the last 168 hours.  Cardiac Enzymes: No results for input(s): CKTOTAL, CKMB, CKMBINDEX, TROPONINI in the last 168 hours.  HbA1C: Hgb A1c MFr Bld  Date/Time Value Ref Range Status  05/11/2021 08:31 AM 6.6 (H) 4.8 - 5.6 % Final    Comment:    (NOTE) Pre diabetes:          5.7%-6.4%  Diabetes:              >6.4%  Glycemic control for   <7.0% adults with diabetes     CBG: Recent Labs  Lab 05/30/21 1206 05/30/21 1607 05/30/21 1941 05/30/21 2319 05/31/21 0319  GLUCAP 160* 147* 170* 186* 147*    Total critical care time: 31 minutes  Performed by:  Baltazar Apo   Critical care time was exclusive of separately billable procedures and treating other  patients.   Critical care was necessary to treat or prevent imminent or life-threatening deterioration.   Critical care was time spent personally by me on the following activities: development of treatment plan with patient and/or surrogate as well as nursing, discussions with consultants, evaluation of patient's response to treatment, examination of patient, obtaining history from patient or surrogate, ordering and performing treatments and interventions, ordering and review of laboratory studies, ordering and review of radiographic studies, pulse oximetry and re-evaluation of patient's condition.    Baltazar Apo, MD, PhD 05/31/2021, 7:46 AM [facility] [phone] or if no answer before 7:00PM call [phone] For any issues after 7:00PM please call eLink [phone]

## 2021-06-01 DIAGNOSIS — J9601 Acute respiratory failure with hypoxia: Secondary | ICD-10-CM | POA: Diagnosis not present

## 2021-06-01 DIAGNOSIS — Z930 Tracheostomy status: Secondary | ICD-10-CM | POA: Diagnosis not present

## 2021-06-01 LAB — GLUCOSE, CAPILLARY
Glucose-Capillary: 108 mg/dL — ABNORMAL HIGH (ref 70–99)
Glucose-Capillary: 119 mg/dL — ABNORMAL HIGH (ref 70–99)
Glucose-Capillary: 138 mg/dL — ABNORMAL HIGH (ref 70–99)
Glucose-Capillary: 143 mg/dL — ABNORMAL HIGH (ref 70–99)
Glucose-Capillary: 164 mg/dL — ABNORMAL HIGH (ref 70–99)
Glucose-Capillary: 73 mg/dL (ref 70–99)

## 2021-06-01 LAB — PHENYTOIN LEVEL, TOTAL: Phenytoin Lvl: <2.5 ug/mL — ABNORMAL LOW (ref 10.0–20.0)

## 2021-06-01 NOTE — Evaluation (Signed)
[facility] Speaking Valve - Evaluation Patient Details  Name: Olivia Shah MRN: [id number] Date of Birth: Oct 04, 1955  Today's Date: 06/01/2021 Time: [id number] SLP Time Calculation (min) (ACUTE ONLY): 12 min  Past Medical History:  Past Medical History:  Diagnosis Date   Arthritis    Diabetes mellitus without complication ([place])    Hypertension    Past Surgical History:  Past Surgical History:  Procedure Laterality Date   APPLICATION OF CRANIAL NAVIGATION N/A 05/18/2021   Procedure: APPLICATION OF CRANIAL NAVIGATION;  Surgeon: Judith Part, MD;  Location: [facility];  Service: Neurosurgery;  Laterality: N/A;   BREAST BIOPSY Right    CHOLECYSTECTOMY     CRANIOTOMY Right 05/18/2021   Procedure: Right craniotomy for tumor resection;  Surgeon: Judith Part, MD;  Location: [facility];  Service: Neurosurgery;  Laterality: Right;   EYE SURGERY     as a child   HERNIA REPAIR     HPI:  34 female s/p 8/23 resection of planum meningioma R craniotomy and noted to have seizure after procedure MRI (+) acute infarcts R anterior temporal lobe inferior frontal lobe, lentiform nucleus and posterior limb of the R internal capsule  PMH congenitally blind OD, arthritis, DM HTN   Assessment / Plan / Recommendation Clinical Impression  Olivia Shah was mildly drowsy but adequatley awake for PMV assessment on trach collar. Cuff deflated at baseline and pt following commands throughout. Airway deemed adequately patent with subjective measures prior to valve placement. MD noted upper airway edema and stridor 9/3 and in agreement to assessment today. She does appear to have back pressure of exhaled air once valve in place for approximately 60-90 seconds with each trial. She was unable to achive vocalizaiton on attempts and suspect utilization of during attempts in sentences. She was stimulable to production of exaggerated inahalation and exhalation and needs continued therapy to coordinate  respiration/phonation and intelligibility.  All vitals remained in normal range. Recommend wear valve with ST only. SLP Visit Diagnosis: Aphonia (R49.1)    SLP Assessment  Patient needs continued Speech Lanaguage Pathology Services    Follow Up Recommendations  Inpatient Rehab    Frequency and Duration min 2x/week  2 weeks    PMSV Trial PMSV was placed for:  (20 min trial) Able to redirect subglottic air through upper airway: Yes Able to Attain Phonation: No (questionable x 1 brief/incomplete adduction) Voice Quality: Breathy;Low vocal intensity Able to Expectorate Secretions: Yes Level of Secretion Expectoration with PMSV: Tracheal Breath Support for Phonation: Moderately decreased Respirations During Trial:  (16-32) SpO2 During Trial:  (93-100) Pulse During Trial: 83 Behavior: Other (comment);Controlled;Cooperative (awake)   Tracheostomy Tube       Vent Dependency  FiO2 (%): 35 %    Cuff Deflation Trial  GO Tolerated Cuff Deflation:  (deflated at baseline)        Olivia Shah 06/01/2021, 10:33 AM   Orbie Pyo Colvin Caroli.Ed Risk analyst [phone] Office [phone]

## 2021-06-01 NOTE — Progress Notes (Signed)
Subjective: No clinical seizures overnight.  Patient's nephew at bedside states she has been making significant improvement, able to follow commands, able to mouth some words and asking when she can go home.  Denies any concerns.  ROS: Unable to obtain due to poor mental status, trach  Examination  Vital signs in last 24 hours: Temp:  [97.9 F (36.6 C)-99.6 F (37.6 C)] 98 F (36.7 C) (09/05 1139) Pulse Rate:  [74-98] 74 (09/05 1200) Resp:  [14-30] 26 (09/05 1200) BP: (96-135)/(51-94) 117/63 (09/05 1200) SpO2:  [95 %-100 %] 97 % (09/05 1200) FiO2 (%):  [35 %] 35 % (09/05 1133)  General: lying in bed, NAD CVS: pulse-normal rate and rhythm RS:+trach, CTAB Extremities: normal, warm Neuro: Opens eyes to repeated verbal stimulation, follows simple one-step commands, PERRLA, right gaze preference but EOMI and blinks to threat bilaterally, difficult to assess visual fields as patient cannot follow complicated commands, antigravity strength in all 4 extremities right more than left  Basic Metabolic Panel: Recent Labs  Lab 05/28/21 0509 05/28/21 1115 05/29/21 0555 05/30/21 0500  NA 140 141 139 142  K 3.1* 4.7 3.9 3.9  CL 111  --  106 107  CO2 22  --  24 26  GLUCOSE 179*  --  94 128*  BUN 26*  --  28* 25*  CREATININE 0.58  --  0.74 0.77  CALCIUM 6.8*  --  8.2* 8.6*  MG 1.8  --  2.3 2.0  PHOS 2.9  --  4.0  --     CBC: Recent Labs  Lab 05/28/21 0509 05/28/21 1115 05/30/21 0500  WBC 9.7  --  13.5*  HGB 8.3* 9.9* 9.4*  HCT 25.7* 29.0* 30.0*  MCV 89.2  --  88.8  PLT 316  --  485*     Coagulation Studies: No results for input(s): LABPROT, INR in the last 72 hours.  Imaging No new brain imaging overnight   ASSESSMENT AND PLAN: 65 year old female on the Neurosurgery service, with new onset seizures s/p right pterional craniotomy for meningioma resection which was also complicated by right hemispheric strokes.  Focal motor status is improved with intubation and addition of  propofol, susbequently successfully weaned 8/27. Now will wean phenobarb   Focal convulsive status epilepticus, refractory, resolved. Meningioma status post right pterional craniotomy Acute right hemispheric strokes  -No further seizures, continuing to improve.   Recommendations -Weaning phenytoin : '50mg'$  bid x3 days , last dose on 06/03/2021. -We will check phenytoin level.  If seizure recurs, can consider resuming phenytoin - Continue keppra '2000mg'$  bid, vimpat '200mg'$  bid -Continue seizure precautions -As needed IV Ativan 2 mg for clinical seizure-like activity -Management of rest of comorbidities per primary team -Recommend follow-up with neurology in 6 to 8 weeks after discharge -Neurology will follow peripherally.  Please call us for any further questions. -Updated patient's nephew at bedside in detail   I have spent a total of 25  minutes with the patient reviewing hospital notes,  test results, labs and examining the patient as well as establishing an assessment and plan.  > 50% of time was spent in direct patient care.  Zeb Comfort Epilepsy [facility] For questions after 5pm please refer to AMION to reach the Neurologist on call

## 2021-06-01 NOTE — Progress Notes (Signed)
NAME:  Olivia Shah, MRN:  [id number], DOB:  1956/03/22, LOS: 29 ADMISSION DATE:  05/18/2021, CONSULTATION DATE:  05/20/21 REFERRING MD:  Zada Finders, CHIEF COMPLAINT:  AMS, seizures   History of Present Illness:  Olivia Shah is a 65 y.o. female who has PMH including congenital right-sided blindness, HTN, DM, arthritis.  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  She was taken to the OR for right pterional craniotomy which was successful.   The following morning, she was noted to have a 30 second GTC seizure that resolved spontaneously.  She then returned to baseline; however, she was started on Keppra 1g twice daily as prophylaxis.  Unfortunately, later that morning, she had recurrent seizure lasting about 1 minute again resolving spontaneously.   EEG 8/24 AM demonstrated cortical dysfunction in the right frontotemporal region, moderate diffuse encephalopathy, no seizures or definite epileptiform discharges. EEG 8/24 PM was reviewed and showed 5 seizures arising in the right hemisphere which were subclinical.   Due to recurrent seizures, PCCM asked to see in consultation  Pertinent  Medical History  Meningioma HTN DM  Significant Hospital Events: Including procedures, antibiotic start and stop dates in addition to other pertinent events   8/22 > OR for right craniotomy. 8/23 > 2 separate seizures that resolved spontaneously. 8/24 > subclinical seizures (5 noted on EEG). 8/25 > frequency of seizures increasing despite keppra and phenytoin, started vimpat and phenobarbital, Intubated for GCS of 7. 8/30: Extubated, failed, reintubated 9/1: Failed extubation again due to stridor, reintubated 9/2 tracheostomy placed  Interim History / Subjective:  Changed to trach collar 13:00 9/4, has remains on ATC overnight I/O +5.7L total Working w SLP for PMV  Objective   Blood pressure 115/67, pulse 86, temperature 97.9 F  (36.6 C), temperature source Axillary, resp. rate (!) 29, height '5\' 2"'$  (1.575 m), weight 89.5 kg, SpO2 100 %.    Vent Mode: PSV;CPAP FiO2 (%):  [35 %] 35 % Set Rate:  [40 bmp] 40 bmp PEEP:  [5 cmH20] 5 cmH20 Pressure Support:  [10 cmH20] 10 cmH20   Intake/Output Summary (Last 24 hours) at 06/01/2021 0755 Last data filed at 06/01/2021 0600 Gross per 24 hour  Intake 1756.91 ml  Output 2000 ml  Net -243.09 ml   Filed Weights   05/29/21 0500 05/30/21 0300 05/31/21 0400  Weight: 86.8 kg 87.9 kg 89.5 kg    Examination:   Physical exam: General: Ill-appearing woman, sitting up in bed, comfortable HEENT: Craniotomy scar CDI, improving right orbital edema.  Trach in place Neuro: Awake, nodding to questions and following commands Chest: Decreased bibasilar breath sounds without any wheezing or crackles Heart: Distant, regular, no murmur Abdomen: Nondistended with positive bowel sounds Extremities: No edema Skin: No rash  Chest x-ray 9/2 reviewed by me.  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  No secretion burden, no evidence of respiratory distress.  Plan to continue ATC as she can tolerate,  hopefully 24x7 -Pulmonary hygiene -Appreciate SLP evaluation.  She did tolerate some PMV today although a bit of upper airway noise noted.  We will continue to work on this.  Hopefully her  upper airway edema will resolve over time.  If residual upper airway occlusion suspected then we could consider a repeat laryngoscopy  Probable aspiration pneumonia, treated -Completed 5 days Unasyn on 9/3  DM2 with Hyperglycemia -Metformin on hold -Levemir 10 units twice daily -Sliding scale insulin moderate protocol  Hypertension -Continue irbesartan/HCTZ, metoprolol, amlodipine per home regimen   Best Practice (right click and "Reselect all SmartList Selections" daily)   Diet/type: tubefeeds SLP evaluation DVT prophylaxis: prophylactic heparin  GI prophylaxis: PPI Lines: yes and it is still needed picc R arm placed 8/27 Foley:  N/A Code Status:  full code Last date of multidisciplinary goals of care discussion: per primary  Dispo: may be able to go out of ICU soon. PCCM will follow for trach care  Labs   CBC: Recent Labs  Lab 05/28/21 0509 05/28/21 1115 05/30/21 0500  WBC 9.7  --  13.5*  HGB 8.3* 9.9* 9.4*  HCT 25.7* 29.0* 30.0*  MCV 89.2  --  88.8  PLT 316  --  485*    Basic Metabolic Panel: Recent Labs  Lab 05/28/21 0509 05/28/21 1115 05/29/21 0555 05/30/21 0500  NA 140 141 139 142  K 3.1* 4.7 3.9 3.9  CL 111  --  106 107  CO2 22  --  24 26  GLUCOSE 179*  --  94 128*  BUN 26*  --  28* 25*  CREATININE 0.58  --  0.74 0.77  CALCIUM 6.8*  --  8.2* 8.6*  MG 1.8  --  2.3 2.0  PHOS 2.9  --  4.0  --    GFR: Estimated Creatinine Clearance: 71 mL/min (by C-G formula based on SCr of 0.77 mg/dL). Recent Labs  Lab 05/28/21 0509 05/30/21 0500  WBC 9.7 13.5*    Liver Function Tests: No results for input(s): AST, ALT, ALKPHOS, BILITOT, PROT, ALBUMIN in the last 168 hours.  No results for input(s): LIPASE, AMYLASE in the last 168 hours. No results for input(s): AMMONIA in the last 168 hours.  ABG    Component Value Date/Time   PHART 7.441 05/28/2021 1115   PCO2ART 40.3 05/28/2021 1115   PO2ART 63 (L) 05/28/2021 1115   HCO3 27.4 05/28/2021 1115    TCO2 29 05/28/2021 1115   O2SAT 93.0 05/28/2021 1115     Coagulation Profile: No results for input(s): INR, PROTIME in the last 168 hours.  Cardiac Enzymes: No results for input(s): CKTOTAL, CKMB, CKMBINDEX, TROPONINI in the last 168 hours.  HbA1C: Hgb A1c MFr Bld  Date/Time Value Ref Range Status  05/11/2021 08:31 AM 6.6 (H) 4.8 - 5.6 % Final    Comment:    (NOTE) Pre diabetes:          5.7%-6.4%  Diabetes:              >6.4%  Glycemic control for   <7.0% adults with diabetes     CBG: Recent Labs  Lab 05/31/21 1537 05/31/21 2016 05/31/21 2348 06/01/21 0340 06/01/21 0722  GLUCAP 108* 136* 173* 138* 119*    Total critical care time:  NA   Baltazar Apo, MD, PhD 06/01/2021, 7:55 AM [facility] [phone] or if no answer before 7:00PM call [phone] For any issues after 7:00PM please call eLink [phone]

## 2021-06-01 NOTE — Evaluation (Signed)
Speech Language Pathology Evaluation Patient Details Name: Olivia Shah MRN: [id number] DOB: September 24, 1956 Today's Date: 06/01/2021 Time: [id number] SLP Time Calculation (min) (ACUTE ONLY): 12 min  Problem List:  Patient Active Problem List   Diagnosis Date Noted   Status post tracheostomy ([place])    Acute respiratory failure with hypoxia ([place])    Status epilepticus ([place])    Brain tumor ([place]) 05/18/2021   Past Medical History:  Past Medical History:  Diagnosis Date   Arthritis    Diabetes mellitus without complication ([place])    Hypertension    Past Surgical History:  Past Surgical History:  Procedure Laterality Date   APPLICATION OF CRANIAL NAVIGATION N/A 05/18/2021   Procedure: APPLICATION OF CRANIAL NAVIGATION;  Surgeon: Judith Part, MD;  Location: [facility];  Service: Neurosurgery;  Laterality: N/A;   BREAST BIOPSY Right    CHOLECYSTECTOMY     CRANIOTOMY Right 05/18/2021   Procedure: Right craniotomy for tumor resection;  Surgeon: Judith Part, MD;  Location: [facility];  Service: Neurosurgery;  Laterality: Right;   EYE SURGERY     as a child   HERNIA REPAIR     HPI:  54 female s/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 Pt alert but slumped in bed and required repositioning to raise head. Pt followed 5/5 verbal commands independently. Pt answered yes/no questions gesturally by shaking/nodding head and was oriented to self and place. Pt attempted verbalizations with PMV but unable to phonate consistently despite cues from SLP. SLP noted stridor during attempted vocalizations. Pt writing c/b very small, cursive words. SLP provided 3 cues and  hand over hand assist to use larger printed letters with no change. Written sentence structure appeared to be intact but writing largely illegible. SLP to follow for further diagnostic cognitive-linguistic therapy.    SLP Assessment  SLP Recommendation/Assessment: Patient needs continued Speech Lanaguage Pathology Services SLP Visit Diagnosis: Aphonia (R49.1);Cognitive communication deficit (R41.841)    Follow Up Recommendations  Inpatient Rehab    Frequency and Duration min 2x/week  2 weeks      SLP Evaluation Cognition  Overall Cognitive Status: Difficult to assess Arousal/Alertness: Awake/alert Orientation Level: Oriented to person (Difficult to assess orientation to time, place, or situation) Awareness: Impaired Awareness Impairment: Intellectual impairment;Emergent impairment Executive Function: Self Correcting;Self Monitoring Self Monitoring: Impaired Self Monitoring Impairment: Functional basic Self Correcting: Impaired Self Correcting Impairment: Functional basic       Comprehension  Auditory Comprehension Overall Auditory Comprehension: Appears within functional limits for tasks assessed Yes/No Questions: Within Functional Limits Commands: Within Functional Limits Interfering Components: Visual impairments    Expression Expression Primary Mode of Expression: Nonverbal - gestures (Waves/raises hand/nodding and shaking head. Attempts verbalizations but no voicing apparent.) Verbal Expression Initiation: No impairment Level of Generative/Spontaneous Verbalization: Conversation Interfering Components: Tracheostomy without PMSV Non-Verbal Means of Communication: Eye blink;Gestures Written Expression Dominant Hand: Right Written Expression: Exceptions to [facility] (See impression statement)   Oral / Motor  Oral Motor/Sensory Function Overall Oral Motor/Sensory Function:  (will) Motor Speech Overall Motor Speech: Impaired Respiration: Impaired Phonation: Aphonic;Other  (comment) (stridor) Intelligibility: Intelligibility reduced Word: 0-24% accurate Phrase: 0-24% accurate Sentence: 0-24% accurate Conversation: 0-24% accurate  [place], SLP-Student         Deshane Cotroneo 06/01/2021, 11:18 AM

## 2021-06-01 NOTE — Progress Notes (Signed)
Pt Cortrack became occluded @ approx 2100 this shift, unable to administer 2200 PO meds. Weston Brass, NP w/ neurosx notified. Verbal order received to replace Cortrack in AM.

## 2021-06-01 NOTE — Progress Notes (Signed)
   Providing Compassionate, Quality Care - Together  NEUROSURGERY PROGRESS NOTE   S: No issues overnight. No szs  O: EXAM:  BP 115/67   Pulse 86   Temp 97.9 F (36.6 C) (Axillary)   Resp (!) 29   Ht '5\' 2"'$  (1.575 m)   Wt 89.5 kg   SpO2 100%   BMI 36.09 kg/m   Sleepy, arouses to voice PERRL Moving all extremities Incision clean dry and intact, healing well Trach in place Follows commands   ASSESSMENT:  65 y.o. female with  Planum meningioma   Status post resection   PLAN: -Continue supportive care -Continue antiepileptics   Thank you for allowing me to participate in this patient's care.  Please do not hesitate to call with questions or concerns.   Elwin Sleight, [facility] Cell: [phone]

## 2021-06-02 ENCOUNTER — Inpatient Hospital Stay (HOSPITAL_COMMUNITY): Payer: Medicare Other

## 2021-06-02 DIAGNOSIS — J9601 Acute respiratory failure with hypoxia: Secondary | ICD-10-CM | POA: Diagnosis not present

## 2021-06-02 DIAGNOSIS — R739 Hyperglycemia, unspecified: Secondary | ICD-10-CM

## 2021-06-02 DIAGNOSIS — I10 Essential (primary) hypertension: Secondary | ICD-10-CM | POA: Diagnosis not present

## 2021-06-02 DIAGNOSIS — Z930 Tracheostomy status: Secondary | ICD-10-CM | POA: Diagnosis not present

## 2021-06-02 LAB — BASIC METABOLIC PANEL
Anion gap: 8 (ref 5–15)
BUN: 17 mg/dL (ref 8–23)
CO2: 25 mmol/L (ref 22–32)
Calcium: 9 mg/dL (ref 8.9–10.3)
Chloride: 105 mmol/L (ref 98–111)
Creatinine, Ser: 0.65 mg/dL (ref 0.44–1.00)
GFR, Estimated: >60 mL/min (ref >60)
Glucose, Bld: 100 mg/dL — ABNORMAL HIGH (ref 70–99)
Potassium: 4.1 mmol/L (ref 3.5–5.1)
Sodium: 138 mmol/L (ref 135–145)

## 2021-06-02 LAB — GLUCOSE, CAPILLARY
Glucose-Capillary: 77 mg/dL (ref 70–99)
Glucose-Capillary: 118 mg/dL — ABNORMAL HIGH (ref 70–99)
Glucose-Capillary: 124 mg/dL — ABNORMAL HIGH (ref 70–99)
Glucose-Capillary: 108 mg/dL — ABNORMAL HIGH (ref 70–99)
Glucose-Capillary: 171 mg/dL — ABNORMAL HIGH (ref 70–99)
Glucose-Capillary: 140 mg/dL — ABNORMAL HIGH (ref 70–99)

## 2021-06-02 LAB — CBC
HCT: 33 % — ABNORMAL LOW (ref 36.0–46.0)
Hemoglobin: 10.6 g/dL — ABNORMAL LOW (ref 12.0–15.0)
MCH: 28.3 pg (ref 26.0–34.0)
MCHC: 32.1 g/dL (ref 30.0–36.0)
MCV: 88.2 fL (ref 80.0–100.0)
Platelets: 546 10*3/uL — ABNORMAL HIGH (ref 150–400)
RBC: 3.74 MIL/uL — ABNORMAL LOW (ref 3.87–5.11)
RDW: 15.1 % (ref 11.5–15.5)
WBC: 11.4 10*3/uL — ABNORMAL HIGH (ref 4.0–10.5)
nRBC: 0 % (ref 0.0–0.2)

## 2021-06-02 LAB — MAGNESIUM: Magnesium: 1.9 mg/dL (ref 1.7–2.4)

## 2021-06-02 NOTE — Progress Notes (Signed)
Neurosurgery Service Progress Note  Subjective: Reintubated, required trach placement, which was performed today  Objective: Vitals:   06/02/21 0400 06/02/21 0455 06/02/21 0709 06/02/21 0744  BP:      Pulse:   82   Resp:      Temp: 98.7 F (37.1 C)   99.2 F (37.3 C)  TempSrc: Axillary   Axillary  SpO2:  98% 96%   Weight:      Height:        Physical Exam: Trach in place, sedation off, somnolent, but opens eyes to voice and Fcx4 without preference, stable surgical pupil OD and reactive OS w/ conjugate gaze  Assessment & Plan: 65 y.o. woman s/p resection of planum meningioma, recovering well. 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 Post-op course w/ GTC on POD1, started keppra 8/23, status epilepticus, 8/25 intubation 2/2 status epilepticus, 8/30 extubated and reintubated, 9/1 extubated and reintubated (stridor again), trach placed 9/2  -no change in neurosurgical plan of care, f/u neurology / CCM recs -will order PT/OT  Marcello Moores A Chrissa Meetze  06/02/21 9:05 AM

## 2021-06-02 NOTE — Progress Notes (Signed)
NAME:  Olivia Shah, MRN:  [id number], DOB:  [birth year], LOS: 10 ADMISSION DATE:  05/18/2021, CONSULTATION DATE:  05/20/21 REFERRING MD:  Zada Finders, CHIEF COMPLAINT:  AMS, seizures   History of Present Illness:  Olivia Shah is a 65 y.o. female who has PMH including congenital right-sided blindness, HTN, DM, arthritis.  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  She was taken to the OR for right pterional craniotomy which was successful.   The following morning, she was noted to have a 30 second GTC seizure that resolved spontaneously.  She then returned to baseline; however, she was started on Keppra 1g twice daily as prophylaxis.  Unfortunately, later that morning, she had recurrent seizure lasting about 1 minute again resolving spontaneously.   EEG 8/24 AM demonstrated cortical dysfunction in the right frontotemporal region, moderate diffuse encephalopathy, no seizures or definite epileptiform discharges. EEG 8/24 PM was reviewed and showed 5 seizures arising in the right hemisphere which were subclinical.   Due to recurrent seizures, PCCM asked to see in consultation  Pertinent  Medical History  Meningioma HTN DM  Significant Hospital Events: Including procedures, antibiotic start and stop dates in addition to other pertinent events   8/22 > OR for right craniotomy. 8/23 > 2 separate seizures that resolved spontaneously. 8/24 > subclinical seizures (5 noted on EEG). 8/25 > frequency of seizures increasing despite keppra and phenytoin, started vimpat and phenobarbital, Intubated for GCS of 7. 8/30: Extubated, failed, reintubated 9/1: Failed extubation again due to stridor, reintubated 9/2 tracheostomy placed 9/6 continues to work with physical therapy and speech  Interim History / Subjective:  No issues overnight  Objective   Blood pressure 116/81, pulse 82, temperature 99.2 F (37.3 C), temperature source  Axillary, resp. rate (Abnormal) 25, height '5\' 2"'$  (1.575 m), weight 90 kg, SpO2 96 %.    FiO2 (%):  [28 %-35 %] 35 %   Intake/Output Summary (Last 24 hours) at 06/02/2021 0804 Last data filed at 06/01/2021 2350 Gross per 24 hour  Intake 1426.65 ml  Output 1000 ml  Net 426.65 ml   Filed Weights   05/30/21 0300 05/31/21 0400 06/02/21 0304  Weight: 87.9 kg 89.5 kg 90 kg    Examination:  General: 65 year old female resting in bed appears to be in no acute distress this morning HEENT craniotomy site clean dry and intact orbital edema decreased tracheostomy unremarkable Pulmonary: Equal nonlabored bilateral breath sounds, clear to auscultation currently aerosol trach collar Cardiac: Regular rate and rhythm no murmur rub or gallop appreciated Abdomen nondistended tolerating tube feeds Extremities: Warm and dry Neuro: Awake, follows commands moves both extremities nods appropriately  Resolved Hospital Problem list   Status epilepticus, resolved Acute hypoxic respiratory failure initially due to status epilepticus Aspiration PNA (Completed rx 9/3 5d unasyn) Assessment & Plan:     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  Speech therapy no on 9/5 notes back pressure from trach after PMV in place for 60 to 90 seconds.  But she is tolerating PMV  Plan Continue routine trach care Mobilize We will change her to a cuffless trach on 9/9, this may help some with PMV tolerance and back pressuring Continue to encourage PMV under direct monitoring  Best Practice (right click and "Reselect all SmartList Selections" daily)   Diet/type: tubefeeds SLP evaluation DVT prophylaxis: prophylactic heparin  GI prophylaxis:  PPI Lines: yes and it is still needed picc R arm placed 8/27 Foley:  N/A Code Status:  full code Last date of multidisciplinary goals of care discussion: per primary

## 2021-06-02 NOTE — Progress Notes (Signed)
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 Intubated 8/27. Attempt to extubate 8/30 unsuccessful. Reintubated 8/30. Trach placed 9/2. PMH: congenitally blind OD, arthritis, DM HTN   OT comments  Pt with best friend visiting in the room. Pt progressed via hoyer lift to chair this session to visit. Pt alert and nodding head  yes/ no. Pt reports some dizziness static sitting with stable BP. Recommendation CIR at this time pending progresssion.    Follow Up Recommendations  LTACH    Equipment Recommendations  Wheelchair (measurements OT);Wheelchair cushion (measurements OT);Hospital bed    Recommendations for Other Services Rehab consult    Precautions / Restrictions Precautions Precautions: Fall Precaution Comments: seizures, cortrak, trach, blind in R eye, flexiseal       Mobility Bed Mobility Overal bed mobility: Needs Assistance Bed Mobility: Supine to Sit     Supine to sit: Max assist     General bed mobility comments: pt progressed to EOB max (A) with +2 total (A) once EOB. pt unable to sustain neck extension. Pt with flexed posture. pt hoyer lift placed at EOB then lifted to chair    Transfers                 General transfer comment: hoyer to chair total +2 (A)    Balance   Sitting-balance support: No upper extremity supported;Feet unsupported Sitting balance-Leahy Scale: Zero                                     ADL either performed or assessed with clinical judgement   ADL Overall ADL's : Needs assistance/impaired Eating/Feeding: NPO Eating/Feeding Details (indicate cue type and reason):  cortrak Grooming: Maximal assistance                                 General ADL Comments: pt sitting eob with a very flexed posture with chest nearly touching knees. pt with a rounded posture and short height making EOB positioning not ideal. pt transferred via hoyer to chair for elongating spine passive stretch     Vision   Additional Comments: L inattention, R eye blind at baseline   Perception     Praxis      Cognition Arousal/Alertness: Awake/alert Behavior During Therapy: Flat affect Overall Cognitive Status: Impaired/Different from baseline                                          Exercises Other Exercises Other Exercises: passive stretch with pillows for scapula retraction  depression in chair (P) Other Exercises: passive stretch for neck extension in chair with support placed with blanket   Shoulder Instructions       General Comments increased risk for skin break down so recommend 2 hours max in chair. Trach collar at this time stable VSS    Pertinent Vitals/ Pain       Pain Assessment: No/denies pain  Home Living  Prior Functioning/Environment              Frequency  Min 2X/week        Progress Toward Goals  OT Goals(current goals can now be found in the care plan section)  Progress towards OT goals: Progressing toward goals  Acute Rehab OT Goals Patient Stated Goal: unable to state OT Goal Formulation: Patient unable to participate in goal setting Time For Goal Achievement: 06/16/21 Potential to Achieve Goals: Good ADL Goals Pt Will Perform Grooming: sitting;with max assist Pt Will Perform Upper Body Bathing: sitting;with max assist Pt Will Transfer to Toilet: bedside commode;stand pivot transfer;with +2 assist;with max assist Additional ADL Goal #1: pt will complete bed mobility mma(A) as precursor to adls.  Plan Discharge plan needs to be updated;All  goals met and education completed, patient discharged from OT services;Frequency needs to be updated    Co-evaluation    PT/OT/SLP Co-Evaluation/Treatment: Yes Reason for Co-Treatment: For patient/therapist safety;Necessary to address cognition/behavior during functional activity;To address functional/ADL transfers;Complexity of the patient's impairments (multi-system involvement)   OT goals addressed during session: ADL's and self-care;Proper use of Adaptive equipment and DME;Strengthening/ROM      AM-PAC OT "6 Clicks" Daily Activity     Outcome Measure   Help from another person eating meals?: A Lot Help from another person taking care of personal grooming?: A Lot Help from another person toileting, which includes using toliet, bedpan, or urinal?: Total Help from another person bathing (including washing, rinsing, drying)?: Total Help from another person to put on and taking off regular upper body clothing?: Total Help from another person to put on and taking off regular lower body clothing?: Total 6 Click Score: 8    End of Session Equipment Utilized During Treatment: Oxygen  OT Visit Diagnosis: Unsteadiness on feet (R26.81);Muscle weakness (generalized) (M62.81)   Activity Tolerance Patient tolerated treatment well   Patient Left in chair;with call bell/phone within reach;with chair alarm set;with family/visitor present   Nurse Communication Mobility status;Precautions        Time: [id number] OT Time Calculation (min): 25 min  Charges: OT General Charges $OT Visit: 1 Visit OT Treatments $Self Care/Home Management : 8-22 mins   [place], OTR/L  Acute Rehabilitation Services Pager: [phone] Office: [phone] .    Jeri Modena 06/02/2021, 12:02 PM

## 2021-06-02 NOTE — Procedures (Signed)
Cortrak ° °Person Inserting Tube:  Lujain Kraszewski C, RD °Tube Type:  Cortrak - 43 inches °Tube Size:  10 °Tube Location:  Left nare °Initial Placement:  Stomach °Secured by: Bridle °Technique Used to Measure Tube Placement:  Marking at nare/corner of mouth °Cortrak Secured At:  66 cm ° °Cortrak Tube Team Note: ° °Consult received to place a Cortrak feeding tube.  ° °X-ray is required, abdominal x-ray has been ordered by the Cortrak team. Please confirm tube placement before using the Cortrak tube.  ° °If the tube becomes dislodged please keep the tube and contact the Cortrak team at [email or web address] (password [email or web address]) for replacement.  °If after hours and replacement cannot be delayed, place a NG tube and confirm placement with an abdominal x-ray.  ° ° °Kalya Troeger P., RD, LDN, CNSC °See AMiON for contact information  ° ° °

## 2021-06-02 NOTE — Progress Notes (Signed)
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 Intubated 8/27. Attempt to extubate 8/30 unsuccessful. Reintubated 8/30. Trach placed 9/2. PMH: congenitally blind OD, arthritis, DM HTN    PT Comments    Pt now with trach, more engaged and increased ability to actively participate however ultimately continues to require totalAx2 for all mobility. Maxi sky used to transfer pt to chair. Pt with slumped over posture requiring maxA to achieve trunk and cervical extension. Acute PT to cont to follow.   Follow Up Recommendations  SNF     Equipment Recommendations  Other (comment) (TBD pending progress)    Recommendations for Other Services       Precautions / Restrictions Precautions Precautions: Fall Precaution Comments: seizures, cortrak, trach, blind in R eye, flexiseal Restrictions Weight Bearing Restrictions: No    Mobility  Bed Mobility Overal bed mobility: Needs Assistance Bed Mobility: Supine to Sit     Supine to sit: +2 for physical assistance;Total assist     General bed mobility comments: pt totalAx2, pt did attempt to assist with R UE however pt with minimal LE movement, once EOB pt totalA to maintain EOB balance, pt with poor trunk and cerbical control causing pt to be very slumped forward and maxA to keep head up/cervical extension    Transfers                 General transfer comment: maxi sky to chair total +2 (A)  Ambulation/Gait                 Stairs             Wheelchair Mobility    Modified Rankin (Stroke Patients Only) Modified Rankin (Stroke Patients Only) Pre-Morbid Rankin Score: No  symptoms Modified Rankin: Severe disability     Balance Overall balance assessment: Needs assistance Sitting-balance support: No upper extremity supported;Feet unsupported Sitting balance-Leahy Scale: Zero Sitting balance - Comments: unable to bring patient into unsupported sitting due to poor postural control in supported sitting. Patient with immediate trunk flexion with placing into chair position   Standing balance support: Bilateral upper extremity supported;During functional activity Standing balance-Leahy Scale: Poor Standing balance comment: reliant on UE support and external assist +2                            Cognition Arousal/Alertness: Awake/alert Behavior During Therapy: Flat affect Overall Cognitive Status: Impaired/Different from baseline                     Current Attention Level: Focused   Following Commands: Follows one step commands inconsistently       General Comments: Following 50% of commands with increased time. Nodding head yes/no to questions asked appropriately, difficulty staying on task/focusing      Exercises Other Exercises Other Exercises: passive stretch with pillows for scapula retraction  depression in chair (P) Other Exercises: passive stretch for neck extension in chair with support placed with blanket    General Comments General comments (skin integrity, edema, etc.): crani incision healing well, VSS      Pertinent Vitals/Pain Pain Assessment: No/denies pain    Home  Living                      Prior Function            PT Goals (current goals can now be found in the care plan section) Acute Rehab PT Goals Patient Stated Goal: unable to state PT Goal Formulation: Patient unable to participate in goal setting Time For Goal Achievement: 06/10/21 Potential to Achieve Goals: Fair Progress towards PT goals: Progressing toward goals    Frequency    Min 3X/week      PT Plan Current plan  remains appropriate    Co-evaluation PT/OT/SLP Co-Evaluation/Treatment: Yes Reason for Co-Treatment: For patient/therapist safety   OT goals addressed during session: ADL's and self-care;Proper use of Adaptive equipment and DME;Strengthening/ROM      AM-PAC PT "6 Clicks" Mobility   Outcome Measure  Help needed turning from your back to your side while in a flat bed without using bedrails?: Total Help needed moving from lying on your back to sitting on the side of a flat bed without using bedrails?: Total Help needed moving to and from a bed to a chair (including a wheelchair)?: Total Help needed standing up from a chair using your arms (e.g., wheelchair or bedside chair)?: Total Help needed to walk in hospital room?: Total Help needed climbing 3-5 steps with a railing? : Total 6 Click Score: 6    End of Session Equipment Utilized During Treatment: Oxygen Activity Tolerance: Patient tolerated treatment well Patient left: with call bell/phone within reach;in chair;with chair alarm set;with family/visitor present Nurse Communication: Mobility status PT Visit Diagnosis: Unsteadiness on feet (R26.81);Muscle weakness (generalized) (M62.81);Difficulty in walking, not elsewhere classified (R26.2);Other abnormalities of gait and mobility (R26.89);Other symptoms and signs involving the nervous system (R29.898)     Time: [id number] PT Time Calculation (min) (ACUTE ONLY): 33 min  Charges:  $Neuromuscular Re-education: 8-22 mins                     Kittie Plater, PT, DPT Acute Rehabilitation Services Pager #: [phone] Office #: [phone]    Berline Lopes 06/02/2021, 2:43 PM

## 2021-06-03 LAB — PHENYTOIN LEVEL, TOTAL: Phenytoin Lvl: <2.5 ug/mL — ABNORMAL LOW (ref 10.0–20.0)

## 2021-06-03 LAB — CBC WITH DIFFERENTIAL/PLATELET
Abs Immature Granulocytes: 0.36 10*3/uL — ABNORMAL HIGH (ref 0.00–0.07)
Basophils Absolute: 0.1 10*3/uL (ref 0.0–0.1)
Basophils Relative: 1 %
Eosinophils Absolute: 0.3 10*3/uL (ref 0.0–0.5)
Eosinophils Relative: 4 %
HCT: 31.4 % — ABNORMAL LOW (ref 36.0–46.0)
Hemoglobin: 9.9 g/dL — ABNORMAL LOW (ref 12.0–15.0)
Immature Granulocytes: 4 %
Lymphocytes Relative: 30 %
Lymphs Abs: 2.6 10*3/uL (ref 0.7–4.0)
MCH: 28.6 pg (ref 26.0–34.0)
MCHC: 31.5 g/dL (ref 30.0–36.0)
MCV: 90.8 fL (ref 80.0–100.0)
Monocytes Absolute: 0.8 10*3/uL (ref 0.1–1.0)
Monocytes Relative: 9 %
Neutro Abs: 4.5 10*3/uL (ref 1.7–7.7)
Neutrophils Relative %: 52 %
Platelets: 547 10*3/uL — ABNORMAL HIGH (ref 150–400)
RBC: 3.46 MIL/uL — ABNORMAL LOW (ref 3.87–5.11)
RDW: 15.7 % — ABNORMAL HIGH (ref 11.5–15.5)
WBC: 8.7 10*3/uL (ref 4.0–10.5)
nRBC: 0.3 % — ABNORMAL HIGH (ref 0.0–0.2)

## 2021-06-03 LAB — GLUCOSE, CAPILLARY
Glucose-Capillary: 128 mg/dL — ABNORMAL HIGH (ref 70–99)
Glucose-Capillary: 143 mg/dL — ABNORMAL HIGH (ref 70–99)
Glucose-Capillary: 152 mg/dL — ABNORMAL HIGH (ref 70–99)
Glucose-Capillary: 175 mg/dL — ABNORMAL HIGH (ref 70–99)
Glucose-Capillary: 187 mg/dL — ABNORMAL HIGH (ref 70–99)
Glucose-Capillary: 189 mg/dL — ABNORMAL HIGH (ref 70–99)

## 2021-06-03 LAB — MAGNESIUM: Magnesium: 2 mg/dL (ref 1.7–2.4)

## 2021-06-03 NOTE — Evaluation (Signed)
Clinical/Bedside Swallow Evaluation Patient Details  Name: Olivia Shah MRN: [id number] Date of Birth: 02-Sep-1956  Today's Date: 06/03/2021 Time: SLP Start Time (ACUTE ONLY): 0955 SLP Stop Time (ACUTE ONLY): 1003 SLP Time Calculation (min) (ACUTE ONLY): 8 min  Past Medical History:  Past Medical History:  Diagnosis Date   Arthritis    Diabetes mellitus without complication ([place])    Hypertension    Past Surgical History:  Past Surgical History:  Procedure Laterality Date   APPLICATION OF CRANIAL NAVIGATION N/A 05/18/2021   Procedure: APPLICATION OF CRANIAL NAVIGATION;  Surgeon: Judith Part, MD;  Location: [facility];  Service: Neurosurgery;  Laterality: N/A;   BREAST BIOPSY Right    CHOLECYSTECTOMY     CRANIOTOMY Right 05/18/2021   Procedure: Right craniotomy for tumor resection;  Surgeon: Judith Part, MD;  Location: [facility];  Service: Neurosurgery;  Laterality: Right;   EYE SURGERY     as a child   HERNIA REPAIR     HPI:  60 female 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 Initial BSE 8/24 > continue NPO. Intubated 8/25-8/30, re-intubated 8/30 and trach 9/2. Repeat BSE due to discharging after intubation.   Assessment / Plan / Recommendation Clinical Impression  Trials of ice chips/spoon/cup sip water wearing PMV on trach collar. She is able to wear valve for 1-2 min periods affected by air trapping, dyspnea, wheeze. Labial seal and containment reduced with min anterior spill. Oral transit and subjective measure of swallow initiation with cup sip appeared adequate. Suspect incomplete airway protection noted by consistent throat clearing. Pulmonary MD noted upper airway edema and stridor. Continue oral care, NPO except for occassional ice and ST  intervention moving toward po's. 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 Initial BSE 8/24 > continue NPO. Intubated 8/25-8/30, re-intubated 8/30 and trach 9/2. Repeat BSE due to discharging after intubation. Type of Study: Bedside Swallow Evaluation Previous Swallow Assessment:  (see HPI) Diet Prior to this Study: NPO;NG Tube Temperature Spikes Noted: No Respiratory Status: Trach;Trach Collar Trach Size and Type: #6;Cuff;Deflated;With PMSV in place History of Recent Intubation: Yes Length of Intubations (days): 8 days Behavior/Cognition: Alert;Cooperative;Pleasant mood;Requires cueing Oral Cavity Assessment: Dry Oral Care Completed by SLP: Recent completion by staff Oral Cavity - Dentition: Adequate natural dentition Vision: Functional for self-feeding Self-Feeding Abilities: Needs assist Patient Positioning: Upright in bed Baseline Vocal Quality:  (aphonic with PMV) Volitional Cough: Other (Comment) (results in throat clear) Volitional Swallow: Able to elicit    Oral/Motor/Sensory Function  Overall Oral Motor/Sensory Function: Within functional limits   Ice Chips Ice chips: Impaired Presentation: Spoon Pharyngeal Phase  Impairments: Throat Clearing - Delayed   Thin Liquid Thin Liquid: Impaired Presentation: Spoon;Cup Oral Phase Impairments: Reduced labial seal Oral Phase Functional Implications: Right anterior spillage Pharyngeal  Phase Impairments: Throat Clearing - Delayed    Nectar Thick Nectar Thick Liquid: Not tested   Honey Thick Honey Thick Liquid: Not tested   Puree Puree: Not tested   Solid     Solid: Not tested       Houston Siren M.Ed Risk analyst [phone] Office [phone]

## 2021-06-03 NOTE — Progress Notes (Signed)
Inpatient Rehab Admissions Coordinator:   Patient was screened for CIR candidacy by Clemens Catholic, MS, CCC-SLP . At this time, Pt. is only able to tolerate bed level therapy and requiring maxisky for OOB. I do not believe she would be able to tolerate the intensity of CIR at this time.  I will not pursue a rehab consult at this time. Recommend other rehab venues to be pursued. Please contact me with any questions.   Clemens Catholic, [place], [place] Admissions Coordinator  [phone] ([place]) [phone] (office)

## 2021-06-03 NOTE — Progress Notes (Signed)
eLink Physician-Brief Progress Note Patient Name: Olivia Shah DOB: 05-02-1956 MRN: [id number]   Date of Service  06/03/2021  HPI/Events of Note  Patient needs a.m. labs.  eICU Interventions  Morning labs + Phenytoin level ordered.        Kerry Kass Syana Degraffenreid 06/03/2021, 4:02 AM

## 2021-06-03 NOTE — TOC Progression Note (Addendum)
Transition of Care [facility]) - Progression Note    Patient Details  Name: Olivia Shah MRN: [id number] Date of Birth: 08/06/1956  Transition of Care [facility]) CM/SW Contact  Ella Bodo, RN Phone Number: 06/03/2021, 2:46 PM  Clinical Narrative:   Patient has been declined by [facility] inpatient rehab, as they feel she is unable to tolerate therapies.  CSW has spoken with patient's family, and they are opposed to skilled nursing facility placement.  Feel patient may be eligible for LTAC hospital until able to discharge home with family members.  MD agreeable with LTAC evaluation for possible eligibility.  Referrals made to both area [place]. TOC to follow.   Addendum: [place] states patient meets eligibility for [facility] hospital, and can offer a bed upon neuro sign off.   Niece open to considering for bridge to home. TOC to follow.   Addendum [place] states patient meets eligibility for [facility] hospital and can offer a bed.   Addendum 67 Patient's niece prefers [employer] [facility] planner for LTAC choice.  Bed available as early as 06/04/2021, per admissions liaison.  TOC to follow-up in a.m.    Expected Discharge Plan: Skilled Nursing Facility Barriers to Discharge: Continued Medical Work up, SNF Pending bed offer  Expected Discharge Plan and Services Expected Discharge Plan: [place] In-house Referral: Clinical Social Work   Post Acute Care Choice: IP Rehab, [place] arrangements for the past 2 months: Single Family Home                                       Social Determinants of Health (SDOH) Interventions    Readmission Risk Interventions No flowsheet data found.  Reinaldo Raddle, RN, BSN  Trauma/Neuro ICU Case Manager [phone]

## 2021-06-03 NOTE — Progress Notes (Signed)
Nutrition Follow-up  DOCUMENTATION CODES:   Obesity unspecified  INTERVENTION:   Tube feeding via Cortrak tube: Jevity 1.2 at 55 ml/h (1320 ml per day) Prosource TF 45 ml TID   Provides 1704 kcal, 106 gm protein, 1070 ml free water daily  Recommend PEG if pt will need SNF level of care at d/c.   NUTRITION DIAGNOSIS:   Inadequate oral intake related to inability to eat as evidenced by NPO status.  Ongoing.   GOAL:   Patient will meet greater than or equal to 90% of their needs  Met with TF.   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  Pt on trach collar. Not able to answer questions.  SLP following for passy-muir valve  Therapy following, per CIR not a candidate at this time.   8/22 s/p R crani 8/24 cortrak placed; tip gastric, subclinical seizures per EEG 8/25 pt intubated due to ongoing seizures that were increasing  9/2 trach placed 9/6 cortrak replaced; tip gastric    Medications reviewed and include: colace, SSI, novolog, levemir, MVI with minerals, protonix, miralax   Labs reviewed:  CBG's: 128-187  UOP: 450 ml   Diet Order:   Diet Order             Diet NPO time specified  Diet effective midnight                   EDUCATION NEEDS:   Not appropriate for education at this time  Skin:  Skin Assessment: Reviewed RN Assessment  Last BM:  9/7 via rectal tube; type 7  Height:   Ht Readings from Last 1 Encounters:  05/18/21 5' 2"  (1.575 m)    Weight:   Wt Readings from Last 1 Encounters:  06/03/21 89.1 kg    BMI:  Body mass index is 35.93 kg/m.  Estimated Nutritional Needs:   Kcal:  1700-1900  Protein:  100-110 grams  Fluid:  >1.7 L/day  Lockie Pares., RD,  LDN, CNSC See AMiON for contact information

## 2021-06-03 NOTE — Progress Notes (Addendum)
  Speech Language Pathology Treatment: Cognitive-Linquistic;Passy Muir Speaking valve  Patient Details Name: Olivia Shah MRN: [id number] DOB: 06/02/1956 Today's Date: 06/03/2021 Time: [id number] SLP Time Calculation (min) (ACUTE ONLY): 13 min  Assessment / Plan / Recommendation Clinical Impression  Pt seen for facilitation of verbal communication, upper airway access/secretion management with PMV and cognitive intervention.  She is moving air through oral cavity however incomplete resulting in back pressure with increased work of breathing/wheezing. Valve fell from trach after reflexive coughs and therapist doffed periodically when dyspnea/wheezing present. Worn for greatest interval of 2 min and unable to attain vocalization despite verbal cueing for deeper inhalations and exacerbated with decreased respiratory support. Vitals: HR 82, RR 25, SpO2 100%. Sister, Enid Derry present and educated. Recommend valve with ST only.   She required verbal cues to sustain attention to therapist and mouthed responses to biographical questions with 60% accuracy. Over articulation and written info was mildly effective. Cues needed for September.    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 and noted to have seizure after procedure MRI (+) acute infarcts R anterior temporal lobe inferior frontal lobe, lentiform nucleus and posterior limb of the R internal capsule  PMH congenitally blind OD, arthritis, DM HTN      SLP Plan  Continue with current plan of care       Recommendations         Patient may use Passy-Muir Speech Valve: with SLP only PMSV Supervision: Full MD: Please consider changing trach tube to : Cuffless         Oral Care Recommendations: Oral care QID Follow up Recommendations: Skilled Nursing facility SLP Visit Diagnosis: Aphonia (R49.1);Cognitive communication deficit [id number]) Plan: Continue with current plan of care       GO                 Houston Siren 06/03/2021, 10:14 AM  Orbie Pyo Colvin Caroli.Ed Risk analyst [phone] Office [phone]   .Marland Kitchenls

## 2021-06-03 NOTE — Progress Notes (Signed)
Neurosurgery Service Progress Note  Subjective: NAE ON, has been on trach collar by report >24h w/o issue  Objective: Vitals:   06/03/21 0508 06/03/21 0600 06/03/21 0752 06/03/21 0811  BP:  93/66  107/70  Pulse:  88  87  Resp:  17  18  Temp:   98.8 F (37.1 C)   TempSrc:   Axillary   SpO2: 99% 95%  100%  Weight:      Height:        Physical Exam: Trach in place, eyes to voice and Fcx4 without preference, stable surgical pupil OD and reactive OS w/ conjugate gaze  Assessment & Plan: 65 y.o. woman s/p resection of planum meningioma, recovering well. 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 Post-op course w/ GTC on POD1, started keppra 8/23, status epilepticus, 8/25 intubation 2/2 status epilepticus, 8/30 extubated and reintubated, 9/1 extubated and reintubated (stridor again), trach placed 9/2  -f/u neurology / CCM recs -will order PT/OT re-eval -unless CCM sees a reason she can't transfer, should be stable for transfer to stepdown / 4NP -SCDs/TEDs, SQH  Olivia Shah  06/03/21 9:09 AM

## 2021-06-03 NOTE — TOC Initial Note (Signed)
Transition of Care [facility]) - Initial/Assessment Note    Patient Details  Name: Olivia Shah MRN: [id number] Date of Birth: 04-01-1956  Transition [facility] Care [facility]) CM/SW Contact:    Benard Halsted, LCSW Phone Number: 06/03/2021, 9:45 AM  Clinical Narrative:                 CSW received consult for possible SNF placement at time of discharge. CSW spoke with patient's sister and niece, though niece is Marine scientist. Patient's niece expressed understanding of PT recommendation of SNF but is requesting inpatient rehab. CSW explained that CIR has not been recommended at this time but they can be re-consulted to see if patient would qualify. She stated they would definitely not want patient in a SNF even for rehab as she has worked in them before. She reported that if patient does not qualify for CIR, then they will take patient home with [facility] home health services and DME as family has already been making preparations to do so. No further questions reported at this time.   Expected Discharge Plan: Skilled Nursing Facility Barriers to Discharge: Continued Medical Work up, SNF Pending bed offer   Patient Goals and CMS Choice Patient states their goals for this hospitalization and ongoing recovery are:: rehab CMS Medicare.gov Compare Post Acute Care list provided to:: Patient Represenative (must comment) Choice offered to / list presented to : Sibling (niece and sister)  Expected Discharge Plan and Services Expected Discharge Plan: [place] In-house Referral: Clinical Social Work   Post Acute Care Choice: IP Rehab, Home Health Living arrangements for the past 2 months: Single Family Home                                      Prior Living Arrangements/Services Living arrangements for the past 2 months: Single Family Home Lives with:: Siblings Patient language and need for interpreter reviewed:: Yes Do you feel safe going back to the place where you live?: Yes       Need for Family Participation in Patient Care: Yes (Comment) Care giver support system in place?: Yes (comment)   Criminal Activity/Legal Involvement Pertinent to Current Situation/Hospitalization: No - Comment as needed  Activities of Daily Living Home Assistive Devices/Equipment: None ADL Screening (condition at time of admission) Patient's cognitive ability adequate to safely complete daily activities?: Yes Is the patient deaf or have difficulty hearing?: No Does the patient have difficulty seeing, even when wearing glasses/contacts?: No Does the patient have difficulty concentrating, remembering, or making decisions?: No Patient able to express need for assistance with ADLs?: Yes Does the patient have difficulty dressing or bathing?: No Independently performs ADLs?: Yes (appropriate for developmental age) Does the patient have difficulty walking or climbing stairs?: Yes Weakness of Legs: None Weakness of Arms/Hands: None  Permission Sought/Granted Permission sought to share information with : Facility Sport and exercise psychologist, Family Supports Permission granted to share information with : No  Share Information with NAME: Jonelle Sidle     Permission granted to share info w Relationship: Niece  Permission granted to share info w Contact Information: [phone]  Emotional Assessment Appearance:: Appears stated age Attitude/Demeanor/Rapport: Unable to Assess Affect (typically observed): Unable to Assess Orientation: :  (tracheostomy) Alcohol / Substance Use: Not Applicable Psych Involvement: No (comment)  Admission diagnosis:  Brain tumor [facility]) [D49.6] Patient Active Problem List   Diagnosis Date Noted   Status post tracheostomy ([place])  Acute respiratory failure with hypoxia [facility])    Status epilepticus ([place])    Brain tumor ([place]) 05/18/2021   PCP:  Seward Carol, MD Pharmacy:   [facility] [place] [address] Phone: [phone] Fax: [phone]     Social Determinants of Health (SDOH) Interventions    Readmission Risk Interventions No flowsheet data found.

## 2021-06-04 ENCOUNTER — Inpatient Hospital Stay
Admission: RE | Admit: 2021-06-04 | Discharge: 2021-06-16 | Disposition: A | Discharge status: Rehabilitation Facility | Payer: Medicare Other | Source: Ambulatory Visit | Attending: Internal Medicine | Admitting: Internal Medicine

## 2021-06-04 ENCOUNTER — Other Ambulatory Visit (HOSPITAL_COMMUNITY): Payer: Medicare Other

## 2021-06-04 DIAGNOSIS — J9601 Acute respiratory failure with hypoxia: Secondary | ICD-10-CM | POA: Diagnosis present

## 2021-06-04 DIAGNOSIS — D496 Neoplasm of unspecified behavior of brain: Secondary | ICD-10-CM | POA: Diagnosis present

## 2021-06-04 DIAGNOSIS — E1165 Type 2 diabetes mellitus with hyperglycemia: Secondary | ICD-10-CM

## 2021-06-04 DIAGNOSIS — Z01818 Encounter for other preprocedural examination: Secondary | ICD-10-CM

## 2021-06-04 DIAGNOSIS — R569 Unspecified convulsions: Secondary | ICD-10-CM

## 2021-06-04 DIAGNOSIS — I10 Essential (primary) hypertension: Secondary | ICD-10-CM

## 2021-06-04 DIAGNOSIS — R061 Stridor: Secondary | ICD-10-CM

## 2021-06-04 DIAGNOSIS — G40901 Epilepsy, unspecified, not intractable, with status epilepticus: Secondary | ICD-10-CM | POA: Diagnosis present

## 2021-06-04 DIAGNOSIS — J9690 Respiratory failure, unspecified, unspecified whether with hypoxia or hypercapnia: Secondary | ICD-10-CM

## 2021-06-04 DIAGNOSIS — T85598A Other mechanical complication of other gastrointestinal prosthetic devices, implants and grafts, initial encounter: Secondary | ICD-10-CM

## 2021-06-04 DIAGNOSIS — E876 Hypokalemia: Secondary | ICD-10-CM

## 2021-06-04 DIAGNOSIS — Z930 Tracheostomy status: Secondary | ICD-10-CM

## 2021-06-04 LAB — GLUCOSE, CAPILLARY
Glucose-Capillary: 195 mg/dL — ABNORMAL HIGH (ref 70–99)
Glucose-Capillary: 179 mg/dL — ABNORMAL HIGH (ref 70–99)
Glucose-Capillary: 147 mg/dL — ABNORMAL HIGH (ref 70–99)
Glucose-Capillary: 166 mg/dL — ABNORMAL HIGH (ref 70–99)
Glucose-Capillary: 156 mg/dL — ABNORMAL HIGH (ref 70–99)

## 2021-06-04 MED ORDER — SODIUM CHLORIDE 0.9 % IV SOLN: 200.0000 mg | Freq: Two times a day (BID) | INTRAVENOUS | Status: DC

## 2021-06-04 MED ORDER — PANTOPRAZOLE SODIUM 40 MG PO PACK: 40.0000 mg | PACK | Freq: Every day | ORAL | Status: DC

## 2021-06-04 MED ORDER — PROSOURCE TF PO LIQD: 45.0000 mL | Freq: Three times a day (TID) | ORAL | Status: DC

## 2021-06-04 MED ORDER — JEVITY 1.2 CAL PO LIQD: 1000.0000 mL | ORAL | 0 refills | Status: DC

## 2021-06-04 MED ORDER — SODIUM CHLORIDE 0.9 % IV SOLN: 2000.0000 mg | Freq: Two times a day (BID) | INTRAVENOUS | Status: DC

## 2021-06-04 NOTE — Plan of Care (Signed)

## 2021-06-04 NOTE — Progress Notes (Signed)
Neurosurgery Service Progress Note  Subjective: NAE ON, on stepdown since yesterday without issue  Objective: Vitals:   06/04/21 0412 06/04/21 0500 06/04/21 0600 06/04/21 0800  BP: 114/72   117/70  Pulse: 91 83 88 92  Resp: 13 (!) '25 19 19  '$ Temp: 99.8 F (37.7 C)   99.2 F (37.3 C)  TempSrc: Oral   Oral  SpO2: 100% 97% 97% 93%  Weight:      Height:        Physical Exam: Trach in place, eyes to voice and Fcx4 without preference, stable surgical pupil OD and reactive OS w/ conjugate gaze  Assessment & Plan: 65 y.o. woman s/p resection of planum meningioma, recovering well. 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 Post-op course w/ GTC on POD1, started keppra 8/23, status epilepticus, 8/25 intubation 2/2 status epilepticus, 8/30 extubated and reintubated, 9/1 extubated and reintubated (stridor again), trach placed 9/2, tach collar tolerated / stepdown transfer 9/7  Neuro: -status epilepticus, resolved, on lacosamide 200bid, keppra 2bid, weaned off PHT, PHT lvl undetectable -f/u neurology as outpt in 6-8 weeks  Cardiopulm: -on TC sat'ing well, CCM considering change to cuffless trach 9/9 -PMV w/ supervision when working w/ speech  FENGI: -on TF at 55cc/hr w/ FWF  Heme/ID: -no active issues  Endo/Ppx/Dispo: -SCDs, TEDs, SQH -LTAC transfer pending  Judith Part  06/04/21 8:44 AM

## 2021-06-04 NOTE — Discharge Summary (Signed)
Discharge Summary  Date of Admission: 05/18/2021  Date of Discharge: 06/04/21  Attending Physician: Emelda Brothers, MD  Hospital Course: Patient was admitted following craniotomy and resection of a planum meningioma. Post-operatively, she initially did well and was at her baseline with good preservation of her preop vision. An MRI showed a gross total resection with diffusion changes, likely from ICA perforators along the plane of dissection of the tumor that were asymptomatic. But on POD1 she had a GTC which progressed to status epilepticus. PCCM and neurology were consulted who managed her critical care and seizure management, respectively. Her SE required 4 AEDs and intubation, she was extubated twice but required reintubation twice for stridor. A trach was therefore placed at bedside on 9/2. She tolerated trach collar and was transferred to stepdown, was weaned down to 2 AEDs (keppra and lacosamide).   Follow up with me 2 weeks post-op as able pending transport, etc from LTAC Follow up with neurology in 6-8 weeks post-discharge for AED management / weaning  Neurologic exam at discharge: Trach in place, eyes to voice and Fcx4 without preference, stable surgical pupil OD and reactive OS w/ conjugate gaze  Discharge diagnosis: Intracranial meningioma  Allergies as of 06/04/2021       Reactions   Cashew Nut (anacardium Occidentale) Skin Test Hives   Hives to multiple nuts   Lisinopril Cough   Shellfish Allergy Hives        Medication List     TAKE these medications    amLODipine 10 MG tablet Commonly known as: NORVASC Take 10 mg by mouth daily.   aspirin EC 81 MG tablet Take 81 mg by mouth daily.   CALCIUM 600 + D PO Take 1 tablet by mouth 2 (two) times daily.   dorzolamide-timolol 22.3-6.8 MG/ML ophthalmic solution Commonly known as: COSOPT Place 1 drop into the left eye 2 (two) times daily.   feeding supplement (PROSource TF) liquid Place 45 mLs into feeding tube 3  (three) times daily.   feeding supplement (JEVITY 1.2 CAL) Liqd Place 1,000 mLs into feeding tube continuous.   Garlic 123XX123 MG Caps Take 1,000 mg by mouth daily.   irbesartan-hydrochlorothiazide 150-12.5 MG tablet Commonly known as: AVALIDE Take 1 tablet by mouth daily.   lacosamide 200 mg in sodium chloride 0.9 % 25 mL Inject 200 mg into the vein every 12 (twelve) hours.   latanoprost 0.005 % ophthalmic solution Commonly known as: XALATAN Place 1 drop into the left eye at bedtime.   levETIRAcetam 2,000 mg in sodium chloride 0.9 % 250 mL Inject 2,000 mg into the vein every 12 (twelve) hours.   metFORMIN 500 MG tablet Commonly known as: GLUCOPHAGE Take 500-1,000 mg by mouth See admin instructions. Take 1000 mg by mouth in the morning and 500 mg in the evening   metoprolol tartrate 100 MG tablet Commonly known as: LOPRESSOR Take 200 mg by mouth at bedtime.   multivitamin with minerals Tabs tablet Take 1 tablet by mouth daily.   pantoprazole sodium 40 mg Pack Commonly known as: PROTONIX Place 20 mLs (40 mg total) into feeding tube at bedtime.   vitamin C 1000 MG tablet Take 1,000 mg by mouth daily.   vitamin E 180 MG (400 UNITS) capsule Take 400 Units by mouth daily.               Discharge Care Instructions  (From admission, onward)           Start     Ordered  06/04/21 0000  Discharge wound care:       Comments: Discharge Instructions  No restriction in activities, slowly increase your activity back to normal.   Your incision is closed with absorbable sutures. These will naturally fall off over the next 4-6 weeks. If they become bothersome or cause discomfort, apply some antibiotic ointment like bacitracin or neosporin on the sutures. This will soften them up and usually makes them more comfortable while they dissolve.  Okay to shower on the day of discharge. Be gentle when cleaning your incision. Use regular soap and water. If that is uncomfortable,  try using baby shampoo. Do not submerge the wound under water for 2 weeks after surgery.  Follow up with Dr. Zada Finders in 2 weeks after discharge. If you do not already have a discharge appointment, please call his office at [phone] to schedule a follow up appointment. If you have any concerns or questions, please call the office and let us know.  Follow up with Dr. Hortense Ramal from neurology in clinic in 6-8 weeks after discharge. Her office number is [phone].   06/04/21 [place], MD 06/04/21 8:49 AM

## 2021-06-04 NOTE — Care Management Important Message (Signed)
Important Message  Patient Details  Name: Olivia Shah MRN: [id number] Date of Birth: 02-04-1956   Medicare Important Message Given:  Yes     Orbie Pyo 06/04/2021, 3:05 PM

## 2021-06-04 NOTE — TOC Transition Note (Addendum)
Transition of Care (TOC) - CM/SW Discharge Note Marvetta Gibbons RN,BSN Transitions of Care Unit 4NP (non trauma) - RN Case Manager See Treatment Team for direct Phone #    Patient Details  Name: Olivia Shah MRN: [id number] Date of Birth: 08/28/1956  Transition [facility] Care [facility] [address]) CM/SW Contact:  Dawayne Patricia, RN Phone Number: 06/04/2021, 11:19 AM   Clinical Narrative:    Notified this am by Marissa Calamity. With [facility] that [facility] have a bed available to accept pt today. Per PCCM Jerrye Bushy they are agreeable to [facility] changing out Trach. Have sent msg to Dr. Zada Finders regarding bed at [facility]- and Dr. Zada Finders has given clearance for transition to [facility] today.   Call made to Niece Tiffany- per TC conversation regarding [facility] bed - Jonelle Sidle has accepted bed offer and is agreeable to patient transitioning to [facility] today. Anderson Malta with [facility] also has spoken with Tiffany.   Pt will transition to [facility] this afternoon.  Pt will be going to Rm [id number], receiving Dr is Satira Sark.  RN call report to [phone] -45 min prior to transfer.     Final next level of care: Long Term Acute Care (LTAC) Barriers to Discharge: Barriers Resolved   Patient Goals and CMS Choice Patient states their goals for this hospitalization and ongoing recovery are:: rehab CMS Medicare.gov Compare Post Acute Care list provided to:: Patient Represenative (must comment) (niece) Choice offered to / list presented to : Sibling (niece and sister)  Discharge Placement               [facility]        Discharge Plan and Services In-house Referral: Clinical Social Work Discharge Planning Services: CM Consult Post Acute Care Choice: Long Term Acute Care (LTAC)          DME Arranged: N/A DME Agency: NA       HH Arranged: NA HH Agency: NA        Social Determinants of Health (SDOH) Interventions     Readmission Risk Interventions Readmission Risk Prevention Plan 06/04/2021  Post  Dischage Appt Not Complete  Appt Comments pt transitioning to [facility]  Some recent data might be hidden

## 2021-06-04 NOTE — Progress Notes (Signed)
   NAME:  Olivia Shah, MRN:  [id number], DOB:  [birth year], LOS: 49 ADMISSION DATE:  05/18/2021, CONSULTATION DATE:  05/20/21 REFERRING MD:  Zada Finders, CHIEF COMPLAINT:  AMS, seizures   History of Present Illness:  Olivia Shah is a 65 y.o. female who has PMH including congenital right-sided blindness, HTN, DM, arthritis.  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  She was taken to the OR for right pterional craniotomy which was successful.   The following morning, she was noted to have a 30 second GTC seizure that resolved spontaneously.  She then returned to baseline; however, she was started on Keppra 1g twice daily as prophylaxis.  Unfortunately, later that morning, she had recurrent seizure lasting about 1 minute again resolving spontaneously.   EEG 8/24 AM demonstrated cortical dysfunction in the right frontotemporal region, moderate diffuse encephalopathy, no seizures or definite epileptiform discharges. EEG 8/24 PM was reviewed and showed 5 seizures arising in the right hemisphere which were subclinical.   Due to recurrent seizures, PCCM asked to see in consultation  Pertinent  Medical History  Meningioma HTN DM  Significant Hospital Events: Including procedures, antibiotic start and stop dates in addition to other pertinent events   8/22 > OR for right craniotomy. 8/23 > 2 separate seizures that resolved spontaneously. 8/24 > subclinical seizures (5 noted on EEG). 8/25 > frequency of seizures increasing despite keppra and phenytoin, started vimpat and phenobarbital, Intubated for GCS of 7. 8/30: Extubated, failed, reintubated 9/1: Failed extubation again due to stridor, reintubated 9/2 tracheostomy placed 9/6 continues to work with physical therapy and speech  Interim History / Subjective:  No changes  Objective   Blood pressure 117/70, pulse 92, temperature 99.2 F (37.3 C), temperature source Oral,  resp. rate 19, height '5\' 2"'$  (1.575 m), weight 88.3 kg, SpO2 93 %.    FiO2 (%):  [25 %-28 %] 25 %   Intake/Output Summary (Last 24 hours) at 06/04/2021 0836 Last data filed at 06/04/2021 0600 Gross per 24 hour  Intake 2235 ml  Output 850 ml  Net 1385 ml   Filed Weights   06/02/21 0304 06/03/21 0439 06/04/21 0407  Weight: 90 kg 89.1 kg 88.3 kg    Examination:  General 65 year old female resting in bed. No distress.  HENT NCAT trach; deflated cuff. Poor phonation.  Pulm scattered rhonchi. No distress. Still on ATC Card rrr Abd soft not tender Ext warm and dry Neuro awake, interactive.   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 I hope that phonation quality and PMV will improve w/ cuffless.   Plan Change to cuffless 9/9 Cont routine trach care Encourage monitored PMV   Best Practice (right click and "Reselect all SmartList Selections" daily)  Per primary   Erick Colace ACNP-BC [place] Pager # [phone] OR # [phone] if no answer

## 2021-06-05 DIAGNOSIS — J9601 Acute respiratory failure with hypoxia: Secondary | ICD-10-CM

## 2021-06-05 DIAGNOSIS — R061 Stridor: Secondary | ICD-10-CM

## 2021-06-05 DIAGNOSIS — Z930 Tracheostomy status: Secondary | ICD-10-CM

## 2021-06-05 DIAGNOSIS — D496 Neoplasm of unspecified behavior of brain: Secondary | ICD-10-CM

## 2021-06-05 DIAGNOSIS — G40901 Epilepsy, unspecified, not intractable, with status epilepticus: Secondary | ICD-10-CM

## 2021-06-05 LAB — CBC WITH DIFFERENTIAL/PLATELET
Abs Immature Granulocytes: 0.15 10*3/uL — ABNORMAL HIGH (ref 0.00–0.07)
Basophils Absolute: 0 10*3/uL (ref 0.0–0.1)
Basophils Relative: 1 %
Eosinophils Absolute: 0.4 10*3/uL (ref 0.0–0.5)
Eosinophils Relative: 5 %
HCT: 32 % — ABNORMAL LOW (ref 36.0–46.0)
Hemoglobin: 10.3 g/dL — ABNORMAL LOW (ref 12.0–15.0)
Immature Granulocytes: 2 %
Lymphocytes Relative: 36 %
Lymphs Abs: 3 10*3/uL (ref 0.7–4.0)
MCH: 28.5 pg (ref 26.0–34.0)
MCHC: 32.2 g/dL (ref 30.0–36.0)
MCV: 88.6 fL (ref 80.0–100.0)
Monocytes Absolute: 0.8 10*3/uL (ref 0.1–1.0)
Monocytes Relative: 9 %
Neutro Abs: 3.9 10*3/uL (ref 1.7–7.7)
Neutrophils Relative %: 47 %
Platelets: 537 10*3/uL — ABNORMAL HIGH (ref 150–400)
RBC: 3.61 MIL/uL — ABNORMAL LOW (ref 3.87–5.11)
RDW: 15.9 % — ABNORMAL HIGH (ref 11.5–15.5)
WBC: 8.3 10*3/uL (ref 4.0–10.5)
nRBC: 0 % (ref 0.0–0.2)

## 2021-06-05 LAB — COMPREHENSIVE METABOLIC PANEL
ALT: 69 U/L — ABNORMAL HIGH (ref 0–44)
AST: 58 U/L — ABNORMAL HIGH (ref 15–41)
Albumin: 2.2 g/dL — ABNORMAL LOW (ref 3.5–5.0)
Alkaline Phosphatase: 117 U/L (ref 38–126)
Anion gap: 9 (ref 5–15)
BUN: 20 mg/dL (ref 8–23)
CO2: 24 mmol/L (ref 22–32)
Calcium: 9 mg/dL (ref 8.9–10.3)
Chloride: 104 mmol/L (ref 98–111)
Creatinine, Ser: 0.67 mg/dL (ref 0.44–1.00)
GFR, Estimated: >60 mL/min (ref >60)
Glucose, Bld: 220 mg/dL — ABNORMAL HIGH (ref 70–99)
Potassium: 3.9 mmol/L (ref 3.5–5.1)
Sodium: 137 mmol/L (ref 135–145)
Total Bilirubin: 0.3 mg/dL (ref 0.3–1.2)
Total Protein: 5.3 g/dL — ABNORMAL LOW (ref 6.5–8.1)

## 2021-06-05 LAB — HEMOGLOBIN A1C
Hgb A1c MFr Bld: 6.6 % — ABNORMAL HIGH (ref 4.8–5.6)
Mean Plasma Glucose: 142.72 mg/dL

## 2021-06-05 NOTE — Consult Note (Signed)
Pulmonary Critical Care Medicine [place]  PULMONARY SERVICE  Date of Service: 06/05/2021  PULMONARY CRITICAL CARE CONSULT   Olivia Shah  [id number]  DOB: Jun 09, 1956   DOA: 06/04/2021  Referring Physician: Satira Sark, MD  HPI: Olivia Shah is a 65 y.o. female seen for follow up of Acute on Chronic Respiratory Failure.  Patient has multiple medical problems including diabetes hypertension history of craniotomy meningioma who postoperatively has been doing well with good preservation of her vision however had deterioration to status epilepticus.  Patient was seen by neurology and critical care patient was intubated more or less for airway protection extubated however did not do well and apparently was extubated twice and was reintubated for stridor.  Patient had a tracheostomy done at the bedside on September 2.  She subsequently was able to wean down to T collar and then transferred to our facility for further management.  At the time that she is seen she is doing fairly well has been on T collar and is on 28% now has a #6 cuffed trach in place.  Review of Systems:  ROS performed and is unremarkable other than noted above.  Past Medical History:  Diagnosis Date   Arthritis    Diabetes mellitus without complication ([place])    Hypertension     Past Surgical History:  Procedure Laterality Date   APPLICATION OF CRANIAL NAVIGATION N/A 05/18/2021   Procedure: APPLICATION OF CRANIAL NAVIGATION;  Surgeon: Judith Part, MD;  Location: [place];  Service: Neurosurgery;  Laterality: N/A;   BREAST BIOPSY Right    CHOLECYSTECTOMY     CRANIOTOMY Right 05/18/2021   Procedure: Right craniotomy for tumor resection;  Surgeon: Judith Part, MD;  Location: [place];  Service: Neurosurgery;  Laterality: Right;   EYE SURGERY     as a child   HERNIA REPAIR      Social History:    reports that she has never smoked. She has never used smokeless tobacco. No  history on file for alcohol use and drug use.  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    Hives to multiple nuts   Lisinopril Cough   Shellfish Allergy Hives    Medications: Reviewed on Rounds  Physical Exam:  Vitals: Temperature is 97.8 pulse 97 respiratory rate 19 blood pressure is 135/83 saturations 100%  Ventilator Settings on T collar FiO2 28%  General: Comfortable at this time Eyes: Grossly normal lids, irises & conjunctiva ENT: grossly tongue is normal Neck: no obvious mass Cardiovascular: S1-S2 normal no gallop or rub Respiratory: No rhonchi very coarse breath sounds Abdomen: Soft and nontender Skin: no rash seen on limited exam Musculoskeletal: not rigid Psychiatric:unable to assess Neurologic: no seizure no involuntary movements         Labs on Admission:  Basic Metabolic Panel: Recent Labs  Lab 05/30/21 0500 06/02/21 0200 06/03/21 0409 06/05/21 0348  NA 142 138  --  137  K 3.9 4.1  --  3.9  CL 107 105  --  104  CO2 26 25  --  24  GLUCOSE 128* 100*  --  220*  BUN 25* 17  --  20  CREATININE 0.77 0.65  --  0.67  CALCIUM 8.6* 9.0  --  9.0  MG 2.0 1.9 2.0  --     No results for input(s): PHART, PCO2ART, PO2ART, HCO3, O2SAT in the last 168  hours.  Liver Function Tests: Recent Labs  Lab 06/05/21 0348  AST 58*  ALT 69*  ALKPHOS 117  BILITOT 0.3  PROT 5.3*  ALBUMIN 2.2*   No results for input(s): LIPASE, AMYLASE in the last 168 hours. No results for input(s): AMMONIA in the last 168 hours.  CBC: Recent Labs  Lab 05/30/21 0500 06/02/21 0200 06/03/21 0409 06/05/21 0348  WBC 13.5* 11.4* 8.7 8.3  NEUTROABS  --   --  4.5 3.9  HGB 9.4* 10.6* 9.9* 10.3*  HCT 30.0* 33.0* 31.4* 32.0*  MCV 88.8 88.2 90.8 88.6  PLT 485* 546* 547* 537*    Cardiac Enzymes: No results for input(s): CKTOTAL, CKMB, CKMBINDEX, TROPONINI in the last 168  hours.  BNP (last 3 results) No results for input(s): BNP in the last 8760 hours.  ProBNP (last 3 results) No results for input(s): PROBNP in the last 8760 hours.   Radiological Exams on Admission: DG Chest Port 1 View  Result Date: 06/04/2021 CLINICAL DATA:  NG tube, respiratory failure EXAM: PORTABLE CHEST 1 VIEW COMPARISON:  05/29/2021 FINDINGS: Tracheostomy remains in place, unchanged. Heart is normal size. Bibasilar atelectasis. No effusions or acute bony abnormality. Bibasilar atelectasis IMPRESSION: No active disease. Electronically Signed   By: Rolm Baptise M.D.   On: 06/04/2021 23:08   DG Abd Portable 1V  Result Date: 06/04/2021 CLINICAL DATA:  NG tube placement EXAM: PORTABLE ABDOMEN - 1 VIEW COMPARISON:  06/02/2021 FINDINGS: Feeding tube is in place with the tip in the descending duodenum. IMPRESSION: Feeding tube tip in the descending duodenum. Electronically Signed   By: Rolm Baptise M.D.   On: 06/04/2021 23:08   DG Abd Portable 1V  Result Date: 06/02/2021 CLINICAL DATA:  Feeding tube placement. EXAM: PORTABLE ABDOMEN - 1 VIEW COMPARISON:  None. FINDINGS: The bowel gas pattern is normal. Distal tip of Dobbhoff tube is seen in expected position of distal stomach or proximal duodenum. No radio-opaque calculi or other significant radiographic abnormality are seen. IMPRESSION: Distal tip of Dobbhoff tube is seen in expected position of distal stomach or proximal duodenum. Electronically Signed   By: Marijo Conception M.D.   On: 06/02/2021 13:14    Assessment/Plan Active Problems:   Brain tumor ([place])   Status epilepticus ([place])   Status post tracheostomy ([place])   Acute respiratory failure with hypoxia (HCC)   Stridor   Acute on chronic respiratory failure with hypoxia patient currently is on T collar has been on 28% FiO2 using the PMV patient has a #6 trach in place seems to be doing well at this time.  Patient may to slowly wean with capping as she has failed attempts at  decannulation x2 Stridor we will need to see how she does once we get the capping trials.  I would recommend probably changing over to a cuffless trach Status epilepticus now is resolved we will continue to monitor closely. Anterior skull base meningioma status postcraniotomy and resection Encephalopathy was felt to be due to seizures now is improved  I have personally seen and evaluated the patient, evaluated laboratory and imaging results, formulated the assessment and plan and placed orders. The Patient requires high complexity decision making with multiple systems involvement.  Case was discussed on Rounds with the Respiratory Therapy Director and the Respiratory staff Time Spent 99mnutes  Derrill Bagnell A Haji Delaine, MD FSt. Louise Regional HospitalPulmonary Critical Care Medicine Sleep Medicine

## 2021-06-06 NOTE — Progress Notes (Signed)
Pulmonary Critical Care Medicine [place]   PULMONARY CRITICAL CARE SERVICE  PROGRESS NOTE     Olivia Shah  [id number]  DOB: Feb 15, 1956   DOA: 06/04/2021  Referring Physician: Satira Sark, MD  HPI: Olivia Shah is a 65 y.o. female being followed for ventilator/airway/oxygen weaning Acute on Chronic Respiratory Failure.  Patient is clinically right now without distress at this time trach is supposed to be changed out today  Medications: Reviewed on Rounds  Physical Exam:  Vitals: Temperature is 97.5 pulse 83 respiratory 27 blood pressure is 129/68 saturations 96%  Ventilator Settings on T collar  General: Comfortable at this time Neck: supple Cardiovascular: no malignant arrhythmias Respiratory: No rhonchi very coarse breath sounds Skin: no rash seen on limited exam Musculoskeletal: No gross abnormality Psychiatric:unable to assess Neurologic:no involuntary movements         Lab Data:   Basic Metabolic Panel: Recent Labs  Lab 06/02/21 0200 06/03/21 0409 06/05/21 0348  NA 138  --  137  K 4.1  --  3.9  CL 105  --  104  CO2 25  --  24  GLUCOSE 100*  --  220*  BUN 17  --  20  CREATININE 0.65  --  0.67  CALCIUM 9.0  --  9.0  MG 1.9 2.0  --     ABG: No results for input(s): PHART, PCO2ART, PO2ART, HCO3, O2SAT in the last 168 hours.  Liver Function Tests: Recent Labs  Lab 06/05/21 0348  AST 58*  ALT 69*  ALKPHOS 117  BILITOT 0.3  PROT 5.3*  ALBUMIN 2.2*   No results for input(s): LIPASE, AMYLASE in the last 168 hours. No results for input(s): AMMONIA in the last 168 hours.  CBC: Recent Labs  Lab 06/02/21 0200 06/03/21 0409 06/05/21 0348  WBC 11.4* 8.7 8.3  NEUTROABS  --  4.5 3.9  HGB 10.6* 9.9* 10.3*  HCT 33.0* 31.4* 32.0*  MCV 88.2 90.8 88.6  PLT 546* 547* 537*    Cardiac Enzymes: No results for input(s): CKTOTAL, CKMB, CKMBINDEX, TROPONINI in the last 168 hours.  BNP (last 3 results) No results  for input(s): BNP in the last 8760 hours.  ProBNP (last 3 results) No results for input(s): PROBNP in the last 8760 hours.  Radiological Exams: DG Chest Port 1 View  Result Date: 06/04/2021 CLINICAL DATA:  NG tube, respiratory failure EXAM: PORTABLE CHEST 1 VIEW COMPARISON:  05/29/2021 FINDINGS: Tracheostomy remains in place, unchanged. Heart is normal size. Bibasilar atelectasis. No effusions or acute bony abnormality. Bibasilar atelectasis IMPRESSION: No active disease. Electronically Signed   By: Rolm Baptise M.D.   On: 06/04/2021 23:08   DG Abd Portable 1V  Result Date: 06/04/2021 CLINICAL DATA:  NG tube placement EXAM: PORTABLE ABDOMEN - 1 VIEW COMPARISON:  06/02/2021 FINDINGS: Feeding tube is in place with the tip in the descending duodenum. IMPRESSION: Feeding tube tip in the descending duodenum. Electronically Signed   By: Rolm Baptise M.D.   On: 06/04/2021 23:08    Assessment/Plan Active Problems:   Brain tumor ([place])   Status epilepticus ([place])   Status post tracheostomy ([place])   Acute respiratory failure with hypoxia (HCC)   Stridor   Acute on chronic respiratory failure hypoxia we will continue with T collar trials.  Continue secretion management pulmonary toilet. Status epilepticus patient is at baseline we will continue to follow along closely Tracheostomy will be changed out today Brain tumor status postresection Stridor need to monitor while  she is using the [place]   I have personally seen and evaluated the patient, evaluated laboratory and imaging results, formulated the assessment and plan and placed orders. The Patient requires high complexity decision making with multiple systems involvement.  Rounds were done with the Respiratory Therapy Director and Staff therapists and discussed with nursing staff also.  Allyne Gee, MD [facility] Pulmonary Critical Care Medicine Sleep Medicine

## 2021-06-07 ENCOUNTER — Other Ambulatory Visit (HOSPITAL_COMMUNITY): Payer: Medicare Other

## 2021-06-07 DIAGNOSIS — G40901 Epilepsy, unspecified, not intractable, with status epilepticus: Secondary | ICD-10-CM | POA: Diagnosis not present

## 2021-06-07 DIAGNOSIS — D496 Neoplasm of unspecified behavior of brain: Secondary | ICD-10-CM | POA: Diagnosis not present

## 2021-06-07 DIAGNOSIS — Z930 Tracheostomy status: Secondary | ICD-10-CM | POA: Diagnosis not present

## 2021-06-07 DIAGNOSIS — J9601 Acute respiratory failure with hypoxia: Secondary | ICD-10-CM | POA: Diagnosis not present

## 2021-06-07 NOTE — Progress Notes (Signed)
Pulmonary Critical Care Medicine [place]   PULMONARY CRITICAL CARE SERVICE  PROGRESS NOTE     Olivia Shah  [id number]  DOB: [birth year]   DOA: 06/04/2021  Referring Physician: Satira Sark, MD  HPI: Olivia Shah is a 65 y.o. female being followed for ventilator/airway/oxygen weaning Acute on Chronic Respiratory Failure.  Patient is currently on T collar has been on 28% FiO2 has been having some issues with the tracheostomy spoke with respiratory therapy during rounds today we will go ahead and change it out  Medications: Reviewed on Rounds  Physical Exam:  Vitals: Temperature 97.7 pulse 81 respiratory 28 blood pressure is 122/69 saturations 99%  Ventilator Settings off the ventilator right now on T collar 28% FiO2  General: Comfortable at this time Neck: supple Cardiovascular: no malignant arrhythmias Respiratory: No rhonchi and no rales are noted Skin: no rash seen on limited exam Musculoskeletal: No gross abnormality Psychiatric:unable to assess Neurologic:no involuntary movements         Lab Data:   Basic Metabolic Panel: Recent Labs  Lab 06/02/21 0200 06/03/21 0409 06/05/21 0348  NA 138  --  137  K 4.1  --  3.9  CL 105  --  104  CO2 25  --  24  GLUCOSE 100*  --  220*  BUN 17  --  20  CREATININE 0.65  --  0.67  CALCIUM 9.0  --  9.0  MG 1.9 2.0  --     ABG: No results for input(s): PHART, PCO2ART, PO2ART, HCO3, O2SAT in the last 168 hours.  Liver Function Tests: Recent Labs  Lab 06/05/21 0348  AST 58*  ALT 69*  ALKPHOS 117  BILITOT 0.3  PROT 5.3*  ALBUMIN 2.2*   No results for input(s): LIPASE, AMYLASE in the last 168 hours. No results for input(s): AMMONIA in the last 168 hours.  CBC: Recent Labs  Lab 06/02/21 0200 06/03/21 0409 06/05/21 0348  WBC 11.4* 8.7 8.3  NEUTROABS  --  4.5 3.9  HGB 10.6* 9.9* 10.3*  HCT 33.0* 31.4* 32.0*  MCV 88.2 90.8 88.6  PLT 546* 547* 537*    Cardiac Enzymes: No  results for input(s): CKTOTAL, CKMB, CKMBINDEX, TROPONINI in the last 168 hours.  BNP (last 3 results) No results for input(s): BNP in the last 8760 hours.  ProBNP (last 3 results) No results for input(s): PROBNP in the last 8760 hours.  Radiological Exams: No results found.  Assessment/Plan Active Problems:   Brain tumor ([place])   Status epilepticus ([place])   Status post tracheostomy ([place])   Acute respiratory failure with hypoxia (HCC)   Stridor   Acute on chronic respiratory failure with hypoxia the plan is to go ahead and change the trach out but I think ENT consultation will be requested also because of the history of stridor after discussed the case during rounds with respiratory therapy Stridor we will continue to monitor closely.  Right now does not seem to be having any Status epilepticus no change we will continue to follow along closely no active seizures noted Brain tumor status post neurosurgical intervention Tracheostomy remains in place right now   I have personally seen and evaluated the patient, evaluated laboratory and imaging results, formulated the assessment and plan and placed orders. The Patient requires high complexity decision making with multiple systems involvement.  Rounds were done with the Respiratory Therapy Director and Staff therapists and discussed with nursing staff also.  Allyne Gee, MD [facility] Pulmonary  Critical Care Medicine Sleep Medicine

## 2021-06-08 NOTE — Progress Notes (Signed)
Pulmonary Critical Care Medicine [place]   PULMONARY CRITICAL CARE SERVICE  PROGRESS NOTE     Olivia WELCHEL  [id number]  DOB: Jan 11, 1956   DOA: 06/04/2021  Referring Physician: Satira Sark, MD  HPI: Olivia Shah is a 65 y.o. female being followed for ventilator/airway/oxygen weaning Acute on Chronic Respiratory Failure.  Apparently patient was decannulated yesterday had some issues with the airway this morning seems to be doing well however with her history of stridor we will need to monitor closely  Medications: Reviewed on Rounds  Physical Exam:  Vitals: Temperature is 98.0 pulse 88 respiratory rate is 22 blood pressure is 134/83 saturations 100%  Ventilator Settings decannulated  General: Comfortable at this time Neck: supple Cardiovascular: no malignant arrhythmias Respiratory: No rhonchi very coarse breath sounds Skin: no rash seen on limited exam Musculoskeletal: No gross abnormality Psychiatric:unable to assess Neurologic:no involuntary movements         Lab Data:   Basic Metabolic Panel: Recent Labs  Lab 06/02/21 0200 06/03/21 0409 06/05/21 0348  NA 138  --  137  K 4.1  --  3.9  CL 105  --  104  CO2 25  --  24  GLUCOSE 100*  --  220*  BUN 17  --  20  CREATININE 0.65  --  0.67  CALCIUM 9.0  --  9.0  MG 1.9 2.0  --     ABG: No results for input(s): PHART, PCO2ART, PO2ART, HCO3, O2SAT in the last 168 hours.  Liver Function Tests: Recent Labs  Lab 06/05/21 0348  AST 58*  ALT 69*  ALKPHOS 117  BILITOT 0.3  PROT 5.3*  ALBUMIN 2.2*   No results for input(s): LIPASE, AMYLASE in the last 168 hours. No results for input(s): AMMONIA in the last 168 hours.  CBC: Recent Labs  Lab 06/02/21 0200 06/03/21 0409 06/05/21 0348  WBC 11.4* 8.7 8.3  NEUTROABS  --  4.5 3.9  HGB 10.6* 9.9* 10.3*  HCT 33.0* 31.4* 32.0*  MCV 88.2 90.8 88.6  PLT 546* 547* 537*    Cardiac Enzymes: No results for input(s): CKTOTAL,  CKMB, CKMBINDEX, TROPONINI in the last 168 hours.  BNP (last 3 results) No results for input(s): BNP in the last 8760 hours.  ProBNP (last 3 results) No results for input(s): PROBNP in the last 8760 hours.  Radiological Exams: No results found.  Assessment/Plan Active Problems:   Brain tumor ([place])   Status epilepticus ([place])   Status post tracheostomy ([place])   Acute respiratory failure with hypoxia (HCC)   Stridor   Acute on chronic respiratory failure with hypoxia we will continue with supportive care oxygen as needed monitor airway closely.  ENT consultation has also been requested yesterday Brain tumor supportive care no change status post resection Status epilepticus no active seizures noted Tracheostomy she is decannulated Stridor we will need to continue to monitor closely.   I have personally seen and evaluated the patient, evaluated laboratory and imaging results, formulated the assessment and plan and placed orders. The Patient requires high complexity decision making with multiple systems involvement.  Rounds were done with the Respiratory Therapy Director and Staff therapists and discussed with nursing staff also.  Allyne Gee, MD [facility] Pulmonary Critical Care Medicine Sleep Medicine

## 2021-06-09 DIAGNOSIS — R061 Stridor: Secondary | ICD-10-CM

## 2021-06-10 ENCOUNTER — Other Ambulatory Visit (HOSPITAL_COMMUNITY): Payer: Medicare Other

## 2021-06-11 ENCOUNTER — Other Ambulatory Visit (HOSPITAL_COMMUNITY): Payer: Medicare Other

## 2021-06-16 ENCOUNTER — Other Ambulatory Visit: Payer: Self-pay

## 2021-06-16 ENCOUNTER — Inpatient Hospital Stay (HOSPITAL_COMMUNITY)
Admission: RE | Admit: 2021-06-16 | Discharge: 2021-07-08 | DRG: 945 | Disposition: A | Discharge status: Home Health | Payer: Medicare Other | Source: Other Acute Inpatient Hospital | Attending: Physical Medicine and Rehabilitation | Admitting: Physical Medicine and Rehabilitation

## 2021-06-16 ENCOUNTER — Encounter (HOSPITAL_COMMUNITY): Payer: Self-pay | Admitting: Physical Medicine and Rehabilitation

## 2021-06-16 DIAGNOSIS — R569 Unspecified convulsions: Secondary | ICD-10-CM | POA: Diagnosis not present

## 2021-06-16 DIAGNOSIS — Z79899 Other long term (current) drug therapy: Secondary | ICD-10-CM

## 2021-06-16 DIAGNOSIS — D496 Neoplasm of unspecified behavior of brain: Secondary | ICD-10-CM

## 2021-06-16 DIAGNOSIS — Z7984 Long term (current) use of oral hypoglycemic drugs: Secondary | ICD-10-CM | POA: Diagnosis not present

## 2021-06-16 DIAGNOSIS — R5381 Other malaise: Secondary | ICD-10-CM | POA: Diagnosis not present

## 2021-06-16 DIAGNOSIS — D72828 Other elevated white blood cell count: Secondary | ICD-10-CM | POA: Diagnosis present

## 2021-06-16 DIAGNOSIS — K5900 Constipation, unspecified: Secondary | ICD-10-CM | POA: Diagnosis not present

## 2021-06-16 DIAGNOSIS — I10 Essential (primary) hypertension: Secondary | ICD-10-CM | POA: Diagnosis present

## 2021-06-16 DIAGNOSIS — H5340 Unspecified visual field defects: Secondary | ICD-10-CM

## 2021-06-16 DIAGNOSIS — E669 Obesity, unspecified: Secondary | ICD-10-CM | POA: Diagnosis not present

## 2021-06-16 DIAGNOSIS — G40101 Localization-related (focal) (partial) symptomatic epilepsy and epileptic syndromes with simple partial seizures, not intractable, with status epilepticus: Secondary | ICD-10-CM | POA: Diagnosis present

## 2021-06-16 DIAGNOSIS — Z7982 Long term (current) use of aspirin: Secondary | ICD-10-CM

## 2021-06-16 DIAGNOSIS — H5462 Unqualified visual loss, left eye, normal vision right eye: Secondary | ICD-10-CM | POA: Diagnosis present

## 2021-06-16 DIAGNOSIS — R112 Nausea with vomiting, unspecified: Secondary | ICD-10-CM

## 2021-06-16 DIAGNOSIS — H409 Unspecified glaucoma: Secondary | ICD-10-CM | POA: Diagnosis present

## 2021-06-16 DIAGNOSIS — R1110 Vomiting, unspecified: Secondary | ICD-10-CM | POA: Diagnosis present

## 2021-06-16 DIAGNOSIS — G40109 Localization-related (focal) (partial) symptomatic epilepsy and epileptic syndromes with simple partial seizures, not intractable, without status epilepticus: Secondary | ICD-10-CM | POA: Diagnosis not present

## 2021-06-16 DIAGNOSIS — E1165 Type 2 diabetes mellitus with hyperglycemia: Secondary | ICD-10-CM

## 2021-06-16 DIAGNOSIS — E876 Hypokalemia: Secondary | ICD-10-CM | POA: Diagnosis present

## 2021-06-16 DIAGNOSIS — Z9049 Acquired absence of other specified parts of digestive tract: Secondary | ICD-10-CM

## 2021-06-16 DIAGNOSIS — R5383 Other fatigue: Secondary | ICD-10-CM | POA: Diagnosis present

## 2021-06-16 DIAGNOSIS — I959 Hypotension, unspecified: Secondary | ICD-10-CM | POA: Diagnosis not present

## 2021-06-16 DIAGNOSIS — R1310 Dysphagia, unspecified: Secondary | ICD-10-CM | POA: Diagnosis present

## 2021-06-16 DIAGNOSIS — H47619 Cortical blindness, unspecified side of brain: Secondary | ICD-10-CM | POA: Diagnosis not present

## 2021-06-16 DIAGNOSIS — E119 Type 2 diabetes mellitus without complications: Secondary | ICD-10-CM | POA: Diagnosis present

## 2021-06-16 DIAGNOSIS — Z86011 Personal history of benign neoplasm of the brain: Secondary | ICD-10-CM

## 2021-06-16 DIAGNOSIS — Z91013 Allergy to seafood: Secondary | ICD-10-CM

## 2021-06-16 DIAGNOSIS — E1169 Type 2 diabetes mellitus with other specified complication: Secondary | ICD-10-CM | POA: Diagnosis not present

## 2021-06-16 DIAGNOSIS — H5440 Blindness, one eye, unspecified eye: Secondary | ICD-10-CM

## 2021-06-16 LAB — CBC
HCT: 39.8 % (ref 36.0–46.0)
Hemoglobin: 12.8 g/dL (ref 12.0–15.0)
MCH: 28.6 pg (ref 26.0–34.0)
MCHC: 32.2 g/dL (ref 30.0–36.0)
MCV: 88.8 fL (ref 80.0–100.0)
Platelets: 390 10*3/uL (ref 150–400)
RBC: 4.48 MIL/uL (ref 3.87–5.11)
RDW: 16.8 % — ABNORMAL HIGH (ref 11.5–15.5)
WBC: 10.7 10*3/uL — ABNORMAL HIGH (ref 4.0–10.5)
nRBC: 0 % (ref 0.0–0.2)

## 2021-06-16 LAB — BASIC METABOLIC PANEL
Anion gap: 12 (ref 5–15)
BUN: 33 mg/dL — ABNORMAL HIGH (ref 8–23)
CO2: 24 mmol/L (ref 22–32)
Calcium: 8.9 mg/dL (ref 8.9–10.3)
Chloride: 99 mmol/L (ref 98–111)
Creatinine, Ser: 0.73 mg/dL (ref 0.44–1.00)
GFR, Estimated: >60 mL/min (ref >60)
Glucose, Bld: 204 mg/dL — ABNORMAL HIGH (ref 70–99)
Potassium: 3.4 mmol/L — ABNORMAL LOW (ref 3.5–5.1)
Sodium: 135 mmol/L (ref 135–145)

## 2021-06-16 LAB — GLUCOSE, CAPILLARY
Glucose-Capillary: 257 mg/dL — ABNORMAL HIGH (ref 70–99)
Glucose-Capillary: 257 mg/dL — ABNORMAL HIGH (ref 70–99)

## 2021-06-16 MED ORDER — EXERCISE FOR HEART AND HEALTH BOOK
Freq: Once | Status: AC
  Filled 2021-06-16: qty 1

## 2021-06-16 MED ORDER — HYDROCHLOROTHIAZIDE 12.5 MG PO CAPS
12.5000 mg | ORAL_CAPSULE | Freq: Every day | ORAL | Status: DC
  Administered 2021-06-17 – 2021-07-08 (×21): 12.5 mg via ORAL
  Filled 2021-06-16 (×22): qty 1

## 2021-06-16 MED ORDER — METFORMIN HCL 500 MG PO TABS
1000.0000 mg | ORAL_TABLET | Freq: Every day | ORAL | Status: DC
  Administered 2021-06-17 – 2021-07-08 (×21): 1000 mg via ORAL
  Filled 2021-06-16 (×22): qty 2

## 2021-06-16 MED ORDER — LEVETIRACETAM 500 MG PO TABS
2000.0000 mg | ORAL_TABLET | Freq: Two times a day (BID) | ORAL | Status: DC
  Administered 2021-06-16 – 2021-06-23 (×14): 2000 mg via ORAL
  Filled 2021-06-16 (×15): qty 4

## 2021-06-16 MED ORDER — PROCHLORPERAZINE EDISYLATE 10 MG/2ML IJ SOLN
5.0000 mg | Freq: Four times a day (QID) | INTRAMUSCULAR | Status: DC | PRN
  Administered 2021-06-27: 10 mg via INTRAMUSCULAR
  Filled 2021-06-16: qty 2

## 2021-06-16 MED ORDER — ACETAMINOPHEN 325 MG PO TABS: 325.0000 mg | ORAL_TABLET | ORAL | Status: DC | PRN

## 2021-06-16 MED ORDER — ENOXAPARIN SODIUM 40 MG/0.4ML IJ SOSY
40.0000 mg | PREFILLED_SYRINGE | INTRAMUSCULAR | Status: DC
Start: 2021-06-17 — End: 2021-07-08
  Administered 2021-06-17 – 2021-07-08 (×22): 40 mg via SUBCUTANEOUS
  Filled 2021-06-16 (×22): qty 0.4

## 2021-06-16 MED ORDER — JUVEN PO PACK
1.0000 | PACK | Freq: Two times a day (BID) | ORAL | Status: DC
  Administered 2021-06-17 – 2021-07-07 (×16): 1 via ORAL
  Filled 2021-06-16 (×20): qty 1

## 2021-06-16 MED ORDER — TRAZODONE HCL 50 MG PO TABS: 25.0000 mg | ORAL_TABLET | Freq: Every evening | ORAL | Status: DC | PRN

## 2021-06-16 MED ORDER — PROCHLORPERAZINE 25 MG RE SUPP: 12.5000 mg | Freq: Four times a day (QID) | RECTAL | Status: DC | PRN

## 2021-06-16 MED ORDER — SACCHAROMYCES BOULARDII 250 MG PO CAPS
250.0000 mg | ORAL_CAPSULE | Freq: Two times a day (BID) | ORAL | Status: DC
  Administered 2021-06-16 – 2021-07-08 (×44): 250 mg via ORAL
  Filled 2021-06-16 (×45): qty 1

## 2021-06-16 MED ORDER — ASPIRIN EC 81 MG PO TBEC
81.0000 mg | DELAYED_RELEASE_TABLET | Freq: Every day | ORAL | Status: DC
  Administered 2021-06-17 – 2021-07-08 (×22): 81 mg via ORAL
  Filled 2021-06-16 (×22): qty 1

## 2021-06-16 MED ORDER — HYDRALAZINE HCL 25 MG PO TABS
25.0000 mg | ORAL_TABLET | Freq: Two times a day (BID) | ORAL | Status: DC
  Administered 2021-06-16 – 2021-06-28 (×19): 25 mg via ORAL
  Filled 2021-06-16 (×26): qty 1

## 2021-06-16 MED ORDER — LATANOPROST 0.005 % OP SOLN
1.0000 [drp] | Freq: Every day | OPHTHALMIC | Status: DC
  Administered 2021-06-16 – 2021-07-07 (×21): 1 [drp] via OPHTHALMIC
  Filled 2021-06-16: qty 2.5

## 2021-06-16 MED ORDER — PROCHLORPERAZINE MALEATE 5 MG PO TABS: 5.0000 mg | ORAL_TABLET | Freq: Four times a day (QID) | ORAL | Status: DC | PRN

## 2021-06-16 MED ORDER — GUAIFENESIN-DM 100-10 MG/5ML PO SYRP: 5.0000 mL | ORAL_SOLUTION | Freq: Four times a day (QID) | ORAL | Status: DC | PRN

## 2021-06-16 MED ORDER — LOSARTAN POTASSIUM 50 MG PO TABS
100.0000 mg | ORAL_TABLET | Freq: Every day | ORAL | Status: DC
  Administered 2021-06-17 – 2021-07-01 (×15): 100 mg via ORAL
  Filled 2021-06-16 (×15): qty 2

## 2021-06-16 MED ORDER — PROSIGHT PO TABS
1.0000 | ORAL_TABLET | Freq: Every day | ORAL | Status: DC
  Administered 2021-06-17 – 2021-07-08 (×22): 1 via ORAL
  Filled 2021-06-16 (×22): qty 1

## 2021-06-16 MED ORDER — OCUVITE-LUTEIN PO CAPS
1.0000 | ORAL_CAPSULE | Freq: Every day | ORAL | Status: DC
  Filled 2021-06-16: qty 1

## 2021-06-16 MED ORDER — METOPROLOL TARTRATE 50 MG PO TABS
100.0000 mg | ORAL_TABLET | Freq: Every day | ORAL | Status: DC
  Administered 2021-06-16 – 2021-07-07 (×16): 100 mg via ORAL
  Filled 2021-06-16 (×21): qty 2

## 2021-06-16 MED ORDER — DORZOLAMIDE HCL-TIMOLOL MAL 2-0.5 % OP SOLN
1.0000 [drp] | Freq: Two times a day (BID) | OPHTHALMIC | Status: DC
  Administered 2021-06-16 – 2021-07-08 (×43): 1 [drp] via OPHTHALMIC
  Filled 2021-06-16: qty 10

## 2021-06-16 MED ORDER — METFORMIN HCL 500 MG PO TABS
500.0000 mg | ORAL_TABLET | Freq: Every day | ORAL | Status: DC
  Administered 2021-06-16 – 2021-07-07 (×22): 500 mg via ORAL
  Filled 2021-06-16 (×22): qty 1

## 2021-06-16 MED ORDER — FLEET ENEMA 7-19 GM/118ML RE ENEM: 1.0000 | ENEMA | Freq: Once | RECTAL | Status: DC | PRN

## 2021-06-16 MED ORDER — POLYETHYLENE GLYCOL 3350 17 G PO PACK: 17.0000 g | PACK | Freq: Every day | ORAL | Status: DC | PRN

## 2021-06-16 MED ORDER — DIPHENHYDRAMINE HCL 12.5 MG/5ML PO ELIX: 12.5000 mg | ORAL_SOLUTION | Freq: Four times a day (QID) | ORAL | Status: DC | PRN

## 2021-06-16 MED ORDER — BISACODYL 10 MG RE SUPP: 10.0000 mg | Freq: Every day | RECTAL | Status: DC | PRN

## 2021-06-16 MED ORDER — AMLODIPINE BESYLATE 10 MG PO TABS
10.0000 mg | ORAL_TABLET | Freq: Every day | ORAL | Status: DC
  Administered 2021-06-17 – 2021-06-22 (×6): 10 mg via ORAL
  Filled 2021-06-16 (×7): qty 1

## 2021-06-16 MED ORDER — LACOSAMIDE 200 MG PO TABS
200.0000 mg | ORAL_TABLET | Freq: Two times a day (BID) | ORAL | Status: DC
  Administered 2021-06-16 – 2021-06-23 (×14): 200 mg via ORAL
  Filled 2021-06-16 (×14): qty 1
  Filled 2021-06-16: qty 4

## 2021-06-16 MED ORDER — INSULIN GLARGINE-YFGN 100 UNIT/ML ~~LOC~~ SOLN
10.0000 [IU] | Freq: Every day | SUBCUTANEOUS | Status: AC
  Administered 2021-06-16: 10 [IU] via SUBCUTANEOUS
  Filled 2021-06-16: qty 0.1

## 2021-06-16 MED ORDER — ASCORBIC ACID 500 MG PO TABS
1000.0000 mg | ORAL_TABLET | Freq: Every day | ORAL | Status: DC
  Administered 2021-06-17 – 2021-07-08 (×22): 1000 mg via ORAL
  Filled 2021-06-16 (×22): qty 2

## 2021-06-16 MED ORDER — FAMOTIDINE 20 MG PO TABS
20.0000 mg | ORAL_TABLET | Freq: Two times a day (BID) | ORAL | Status: DC
  Administered 2021-06-16 – 2021-07-08 (×44): 20 mg via ORAL
  Filled 2021-06-16 (×45): qty 1

## 2021-06-16 MED ORDER — ADULT MULTIVITAMIN W/MINERALS CH
1.0000 | ORAL_TABLET | Freq: Every day | ORAL | Status: DC
  Administered 2021-06-16 – 2021-07-08 (×23): 1 via ORAL
  Filled 2021-06-16 (×23): qty 1

## 2021-06-16 MED ORDER — ALUM & MAG HYDROXIDE-SIMETH 200-200-20 MG/5ML PO SUSP
30.0000 mL | ORAL | Status: DC | PRN
Start: 2021-06-16 — End: 2021-07-08

## 2021-06-16 MED ORDER — VITAMIN E 45 MG (100 UNIT) PO CAPS
400.0000 [IU] | ORAL_CAPSULE | Freq: Every day | ORAL | Status: DC
  Administered 2021-06-16 – 2021-06-17 (×2): 400 [IU] via ORAL
  Filled 2021-06-16 (×3): qty 4

## 2021-06-16 NOTE — PMR Pre-admission (Signed)
PMR Admission Coordinator Pre-Admission Assessment   Patient: Olivia Shah is an 65 y.o., female MRN: [id number] DOB: 01/29/1956 Height:   Weight:     Insurance Information HMO:     PPO:      PCP:      IPA:      80/20:      OTHER:  PRIMARY: Medicare A and B      Policy#: [id number]      Subscriber:  CM Name:       Phone#:      Fax#:  Pre-Cert#: verified online      Employer:  Benefits:  Phone #:      Name:  Eff. Date: A and B 03/28/2019     Deduct: $1556      Out of Pocket Max: n/a      Life Max: n/a CIR: 100%      SNF: 20 full days Outpatient: 80%     Co-Ins: 20% Home Health: 100%      Co-Pay:  DME: 80%     Co-Ins: 20% Providers:  SECONDARY: AARP      Policy#: [id number]     Phone#:    Financial Counselor:       Phone#:    The "Data Collection Information Summary" for patients in Inpatient Rehabilitation Facilities with attached "Privacy Act [place] Records" was provided and verbally reviewed with: Patient and Family   Emergency Contact Information Contact Information       Name Relation Home Work Mobile    GARDEN,TIFFANY Niece     [phone]    Alston,Shirley Sister [phone]   [phone]           Current Medical History  Patient Admitting Diagnosis: 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  She admitted to [facility] for surgical resection on 8/22 per Dr. Zada Finders.  Post op course complicated by status epilepticus, and multiple acute CVAs.  She failed extubation x2 and was ultimately trached on 05/29/2021.  [facility].  She was decannulated on 9/11.  Therapy evaluations were completed and she was recommended for CIR.       Patient's medical record from [facility] has been reviewed by the rehabilitation admission coordinator and physician.   Past Medical History      Past Medical History:  Diagnosis Date   Arthritis     Diabetes mellitus without complication ([place])     Hypertension        Has the patient had major surgery during 100 days prior to admission? Yes   Family History   family history is not on file.   Current Medications No current facility-administered medications for this encounter.   Patients Current Diet:  Diet Order                  DIET - DYS 1 Room service appropriate? Yes with Assist; Fluid consistency: Thin  Diet effective now                         Precautions / Restrictions n/a      Has the patient had 2 or more falls  or a fall with injury in the past year? Yes   Prior Activity Level Community (5-7x/wk): independent prior to admission, working as a Energy manager, driving, no DME used   Prior Functional Level Self Care: Did the patient need help bathing, dressing, using the toilet or eating? Independent   Indoor Mobility: Did the patient need assistance with walking from room to room (with or without device)? Independent   Stairs: Did the patient need assistance with internal or external stairs (with or without device)? Independent   Functional Cognition: Did the patient need help planning regular tasks such as shopping or remembering to take medications? Independent   Patient Information Are you of Hispanic, Latino/a,or Spanish origin?: X. Patient unable to respond What is your race?: X. Patient unable to respond Do you need or want an interpreter to communicate with a doctor or health care staff?: 9. Unable to respond   Patient's Response To:  Health Literacy and Transportation Is the patient able to respond to health literacy and transportation needs?: No   Development worker, international aid / Lane Devices/Equipment:  None Home Equipment: Toilet riser   Prior Device Use: Indicate devices/aids used by the patient prior to current illness, exacerbation or injury? None of the above    Home Assistive Devices / Equipment None Toilet riser   Prior Device Use: Indicate devices/aids used by the patient prior to current illness, exacerbation or injury? None of the above   Prior Functional Level Current Functional Level  Bed Mobility  Independent  Min assist   Transfers  Independent  Mod assist   Mobility - Walk/Wheelchair  Independent      Upper Body Dressing  Independent  Mod assist   Lower Body Dressing  Independent  Max assist   Grooming  Independent  Min assist   Eating/Drinking  Independent      Toilet Transfer  Independent  Mod assist   Bladder Continence   continent  incontinent   Bowel Management  continent  incontinent   Stair Climbing  Independent      Communication     min assist   Memory     max assist     Special needs/care consideration Skin decannulation site, surgical site to scalp and Diabetic management yes    Previous Home Environment (from acute therapy documentation) Living Arrangements: Alone Available Help at Discharge: Family, Available 24 hours/day Type of Home: House Home Layout: One level Home Access: Stairs to enter Entrance Stairs-Rails: Right Entrance Stairs-Number of Steps: 3 Bathroom Shower/Tub: Tub/shower unit, Architectural technologist: Standard Bathroom Accessibility: Yes How Accessible: Accessible via walker [place]: No   Discharge Living Setting Plans for Discharge Living Setting: Patient's home (family will come stay with her) Type of Home at Discharge: House Discharge Home Layout: One level Discharge Home Access: Stairs to enter Entrance Stairs-Rails: Right Entrance Stairs-Number of Steps: 3 Discharge Bathroom Shower/Tub: Tub/shower unit Discharge Bathroom Toilet: Standard Discharge Bathroom  Accessibility: Yes How Accessible: Accessible via walker Does the patient have any problems obtaining your medications?: No   Social/Family/Support Systems Anticipated Caregiver: Storey (main contact, sister), Earley Brooke (sister) Anticipated Caregiver's Contact Information: Jonelle Sidle [phone]; Enid Derry [phone] Ability/Limitations of Caregiver: min assist Caregiver Availability: 24/7 Discharge Plan Discussed with Primary Caregiver: Yes Is Caregiver In Agreement with Plan?: Yes Does Caregiver/Family have Issues with Lodging/Transportation while Pt is in Rehab?: No   Goals Patient/Family Goal for Rehab: PT/OT supervision to mod I, SLP min assist Expected length of stay: 14-18 days  Additional Information: [facility] Pt/Family Agrees to Admission and willing to participate: Yes Program Orientation Provided & Reviewed with Pt/Caregiver Including Roles  & Responsibilities: Yes   Decrease burden of Care through IP rehab admission: no   Possible need for SNF placement upon discharge: n/a    Patient Condition: I have reviewed medical records from [facility], spoken with CM, and patient and family member. I met with patient at the bedside and discussed via phone for inpatient rehabilitation assessment.  Patient will benefit from ongoing PT, OT, and SLP, can actively participate in 3 hours of therapy a day 5 days of the week, and can make measurable gains during the admission.  Patient will also benefit from the coordinated team approach during an Inpatient Acute Rehabilitation admission.  The patient will receive intensive therapy as well as Rehabilitation physician, nursing, social worker, and care management interventions.  Due to bladder management, bowel management, safety, skin/wound care, disease management, medication administration, pain management, and patient education the patient requires 24 hour a day rehabilitation nursing.  The patient is currently  mod assist with mobility and basic ADLs.  Discharge setting and therapy post discharge at home with home health is anticipated.  Patient has agreed to participate in the Acute Inpatient Rehabilitation Program and will admit today.   Preadmission Screen Completed By:  Michel Santee, PT, DPT 06/16/2021 1:15 PM ______________________________________________________________________   Discussed status with Dr. Posey Pronto on 06/16/21  at 1:18 PM  and received approval for admission today.  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 Yes Co-Morbidities requiring supervision/potential complications: Diabetes mellitus type 2, hypertension, congenital right eye blindness, glaucoma Due to bladder management, bowel management, safety, disease management, medication administration, and patient education, does the patient require 24 hr/day rehab nursing? Yes Does the patient require coordinated care of a physician, rehab nurse, PT, OT, and SLP to address physical and functional deficits in the context of the above medical diagnosis(es)? Yes Addressing deficits in the following areas: balance, endurance, locomotion, strength, transferring, bathing, dressing, toileting, cognition, and psychosocial support Can the patient actively participate in an intensive therapy program of at least 3 hrs of therapy 5 days a week? Yes The potential for patient to make measurable gains while on inpatient rehab is excellent Anticipated functional outcomes upon discharge from inpatient rehab: supervision and min assist PT, supervision and min assist OT, supervision and min assist SLP Estimated  rehab length of stay to reach the above functional goals is: 16-18 days. Anticipated discharge destination: Home 10. Overall Rehab/Functional Prognosis: good  MD Signature Delice Lesch, MD, ABPMR

## 2021-06-16 NOTE — H&P (Signed)
Physical Medicine and Rehabilitation Admission H&P    CC: Functional deficits.   HPI: Olivia Shah. Fletchall is a 65 year old LH-female with history of T2DM, HTN, congenital right sided blindness, glaucoma, progressive loss of left vision and 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  History taken from chart review due to cognition.  She was evaluated by Dr. Venetia Constable and admitted on 05/18/2021 due to progressive loss of vision for gross total resection of tumor. Hospital course complicated by status epilepticus requiring addition of multiple meds as well as intubation for airway protection. She failed extubation attempts X 2 due to stridor requiring tracheostomy and was tolerating attempts at wean to ATC. She was limited by lethargy, headaches and recurrent right focal motor seizures. Medications adjusted with resolution of seizures.   She was transferred to [facility] 06/04/2021 for management and wean. Her trach found to have been dislodged on 09/11. She was treated with racemic epi and steroids with need for replacement. Respiratory status has been stable and she has been weaned off oxygen. Solumedrol weaned to prednisone and stops today. Blood pressures have been labile in the 5 days requiring up-tritration/addition of meds for better control. MBS done and she is tolerating D1, thins with aspiration precautions. Patient is cortically blind with cognitive deficits and dysphagia.  Therapy has been ongoing and is working on pregait activity. CIR was recommended due to functional decline.  Please see preadmission assessment from earlier today as well.  Review of Systems  Unable to perform ROS: Mental acuity   Past Medical History:  Diagnosis Date   Arthritis    Diabetes mellitus without complication ([place])    Hypertension     Past Surgical History:  Procedure Laterality Date   APPLICATION OF CRANIAL NAVIGATION N/A 05/18/2021   Procedure: APPLICATION OF CRANIAL NAVIGATION;  Surgeon: Judith Part, MD;   Location: [facility];  Service: Neurosurgery;  Laterality: N/A;   BREAST BIOPSY Right    CHOLECYSTECTOMY     CRANIOTOMY Right 05/18/2021   Procedure: Right craniotomy for tumor resection;  Surgeon: Judith Part, MD;  Location: [facility];  Service: Neurosurgery;  Laterality: Right;   EYE SURGERY     as a child   HERNIA REPAIR      Family History  Problem Relation Age of Onset   Breast cancer Neg Hx     Social History: Lived alone and was independent PTA. Per  reports that she has never smoked. She has never used smokeless tobacco. No history on file for alcohol use and drug use.    Allergies  Allergen Reactions   Cashew Nut (Anacardium Occidentale) Skin Test Hives    Hives to multiple nuts   Lisinopril Cough   Shellfish Allergy Hives    Medications Prior to Admission  Medication Sig Dispense Refill   amLODipine (NORVASC) 10 MG tablet Take 10 mg by mouth daily.     Ascorbic Acid (VITAMIN C) 1000 MG tablet Take 1,000 mg by mouth daily.     aspirin EC 81 MG tablet Take 81 mg by mouth daily.     Calcium Carb-Cholecalciferol (CALCIUM 600 + D PO) Take 1 tablet by mouth 2 (two) times daily.     dorzolamide-timolol (COSOPT) 22.3-6.8 MG/ML ophthalmic solution Place 1 drop into the left eye 2 (two) times daily.     Garlic 9629 MG CAPS Take 1,000 mg by mouth daily.     irbesartan-hydrochlorothiazide (AVALIDE) 150-12.5 MG tablet Take 1 tablet by mouth daily.  lacosamide 200 mg in sodium chloride 0.9 % 25 mL Inject 200 mg into the vein every 12 (twelve) hours.     latanoprost (XALATAN) 0.005 % ophthalmic solution Place 1 drop into the left eye at bedtime.     levETIRAcetam 2,000 mg in sodium chloride 0.9 % 250 mL Inject 2,000 mg into the vein every 12 (twelve) hours.     metFORMIN (GLUCOPHAGE) 500 MG tablet Take 500-1,000 mg by mouth See admin instructions. Take 1000 mg by mouth in the morning and 500 mg in the evening     metoprolol (LOPRESSOR) 100 MG tablet Take 200 mg by mouth at bedtime.      Multiple Vitamin (MULTIVITAMIN WITH MINERALS) TABS tablet Take 1 tablet by mouth daily.     Nutritional Supplements (FEEDING SUPPLEMENT, JEVITY 1.2 CAL,) LIQD Place 1,000 mLs into feeding tube continuous.  0   Nutritional Supplements (FEEDING SUPPLEMENT, PROSOURCE TF,) liquid Place 45 mLs into feeding tube 3 (three) times daily.     pantoprazole sodium (PROTONIX) 40 mg PACK Place 20 mLs (40 mg total) into feeding tube at bedtime. 30 packet    vitamin E 180 MG (400 UNITS) capsule Take 400 Units by mouth daily.      Drug Regimen Review  Drug regimen was reviewed and remains appropriate with no significant issues identified  Home:     Functional History: Independent PTA  Functional Status:  Mobility: Min assist with bed mobility Mod assist to stand at EOB    ADL:    Cognition: Cognition Overall Cognitive Status: Impaired/Different from baseline Arousal/Alertness: Awake/alert Cognition Overall Cognitive Status: Impaired/Different from baseline Following Commands: Follows one step commands consistently  Physical Exam: There were no vitals taken for this visit. Physical Exam Vitals reviewed.  Constitutional:      Appearance: Normal appearance. She is obese.  HENT:     Head: Normocephalic and atraumatic.     Right Ear: External ear normal.     Left Ear: External ear normal.     Nose: Nose normal.  Eyes:     General:        Right eye: No discharge.        Left eye: No discharge.     Extraocular Movements: Extraocular movements intact.     Comments: Left eye w/Exophthalmus, unable to move beyond midline with few beats nystagmus.   Cardiovascular:     Rate and Rhythm: Normal rate and regular rhythm.  Pulmonary:     Effort: Pulmonary effort is normal. No respiratory distress.     Breath sounds: No stridor.  Abdominal:     General: Abdomen is flat. Bowel sounds are normal. There is no distension.     Comments: Multiple ecchymotic areas on abdomen.   Musculoskeletal:      Cervical back: Normal range of motion and neck supple.     Comments: No edema or tenderness in extremities  Skin:    General: Skin is warm and dry.  Neurological:     Mental Status: She is alert. She is disoriented.     Comments: Alert and oriented x1 Motor: Limited due to cognition, however left side appears weaker than right  Unable to see 2 fingers in front of her face and could not see me on the left. Left inattention with delayed output.  She was able to follow occasional one step motor commands with tactile cues.    Psychiatric:        Mood and Affect: Affect is blunt.  Speech: Speech is delayed.        Behavior: Behavior is slowed.        Cognition and Memory: Cognition is impaired.    Results for orders placed or performed during the hospital encounter of 06/04/21 (from the past 48 hour(s))  Basic metabolic panel     Status: Abnormal   Collection Time: 06/16/21  4:39 AM  Result Value Ref Range   Sodium 135 135 - 145 mmol/L   Potassium 3.4 (L) 3.5 - 5.1 mmol/L   Chloride 99 98 - 111 mmol/L   CO2 24 22 - 32 mmol/L   Glucose, Bld 204 (H) 70 - 99 mg/dL    Comment: Glucose reference range applies only to samples taken after fasting for at least 8 hours.   BUN 33 (H) 8 - 23 mg/dL   Creatinine, Ser 0.73 0.44 - 1.00 mg/dL   Calcium 8.9 8.9 - 10.3 mg/dL   GFR, Estimated >60 >60 mL/min    Comment: (NOTE) Calculated using the CKD-EPI Creatinine Equation (2021)    Anion gap 12 5 - 15    Comment: Performed at [facility] [address], [place], [place] [address]  CBC     Status: Abnormal   Collection Time: 06/16/21  4:39 AM  Result Value Ref Range   WBC 10.7 (H) 4.0 - 10.5 K/uL   RBC 4.48 3.87 - 5.11 MIL/uL   Hemoglobin 12.8 12.0 - 15.0 g/dL   HCT 39.8 36.0 - 46.0 %   MCV 88.8 80.0 - 100.0 fL   MCH 28.6 26.0 - 34.0 pg   MCHC 32.2 30.0 - 36.0 g/dL   RDW 16.8 (H) 11.5 - 15.5 %   Platelets 390 150 - 400 K/uL   nRBC 0.0 0.0 - 0.2 %    Comment: Performed at  [facility], [place] [address] [place], [place] [address]   No results found.     Medical Problem List and Plan: 1.  Deficits with mobility, swallowing, cognition secondary to seizures status post meningioma resection.  -patient may not shower  -ELOS/Goals: 14-17 days/Supervision/Min A  Admit to CIR 2.  Antithrombotics: -DVT/anticoagulation:  Pharmaceutical: Lovenox  -antiplatelet therapy: N/A 3. Pain Management: PRN meds 4. Mood: LCSW to follow for evaluation and support.   -antipsychotic agents: N/A 5. Neuropsych: This patient is not capable of making decisions on her own behalf. 6. Skin/Wound Care: Routine pressure relief measures.  7. Fluids/Electrolytes/Nutrition: Monitor I/Os  CMP ordered 8. HTN: Monitor BP TID--continue Losartan, amlodipine, hydralazine.  Monitor with increased mobility 9.T2DM: Hgb A1C-6.6 and well controlled on home regimen 1000 mg am/500 mg pm  --Will discontinue Lantus after pm dose. Resume metformin as will off steroids.   --Monitor BS ac/hs and use SSI prn   Monitor with increased mobility 10. Cortical blindness: Will need assistance with meals and adaptive equipment.  11.  Hypokalemia  CMP ordered for tomorrow  Bary Leriche, PA-C 06/16/2021  I have personally performed a face to face diagnostic evaluation, including, but not limited to relevant history and physical exam findings, of this patient and developed relevant assessment and plan.  Additionally, I have reviewed and concur with the physician assistant's documentation above.  Delice Lesch, MD, ABPMR

## 2021-06-16 NOTE — H&P (Signed)
Physical Medicine and Rehabilitation Admission H&P    CC: Functional deficits.   HPI: Ceriah Shah. 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  History taken from chart review due to cognition.  She was evaluated by Dr. Venetia Constable and admitted on 05/18/2021 due to progressive loss of vision for gross total resection of tumor. Hospital course complicated by status epilepticus requiring addition of multiple meds as well as intubation for airway protection. She failed extubation attempts X 2 due to stridor requiring tracheostomy and was tolerating attempts at wean to ATC. She was limited by lethargy, headaches and recurrent right focal motor seizures. Medications adjusted with resolution of seizures.   She was transferred to [facility] 06/04/2021 for management and wean. Her trach found to have been dislodged on 09/11. She was treated with racemic epi and steroids with need for replacement. Respiratory status has been stable and she has been weaned off oxygen. Solumedrol weaned to prednisone and stops today. Blood pressures have been labile in the 5 days requiring up-tritration/addition of meds for better control. MBS done and she is tolerating D1, thins with aspiration precautions. Patient is cortically blind with cognitive deficits and dysphagia.  Therapy has been ongoing and is working on pregait activity. CIR was recommended due to functional decline.  Please see preadmission assessment from earlier today as well.  Review of Systems  Unable to perform ROS: Mental acuity   Past Medical History: Diagnosis Date  Arthritis   Diabetes mellitus without complication ([place])   Hypertension    Past Surgical History: Procedure Laterality Date  APPLICATION OF CRANIAL NAVIGATION N/A 0/16/0109  Procedure: APPLICATION OF CRANIAL NAVIGATION;  Surgeon: Judith Part, MD;   Location: [facility] [place];  Service: Neurosurgery;  Laterality: N/A;  BREAST BIOPSY Right   CHOLECYSTECTOMY    CRANIOTOMY Right 05/18/2021  Procedure: Right craniotomy for tumor resection;  Surgeon: Judith Part, MD;  Location: [place];  Service: Neurosurgery;  Laterality: Right;  EYE SURGERY    as a child  HERNIA REPAIR     Family History Problem Relation Age of Onset  Breast cancer Neg Hx    Social History: Lived alone and was independent PTA. Per  reports that she has never smoked. She has never used smokeless tobacco. No history on file for alcohol use and drug use.    Allergies Allergen Reactions  Cashew Nut (Anacardium Occidentale) Skin Test Hives   Hives to multiple nuts  Lisinopril Cough  Shellfish Allergy Hives   Medications Prior to Admission Medication Sig Dispense Refill  amLODipine (NORVASC) 10 MG tablet Take 10 mg by mouth daily.    Ascorbic Acid (VITAMIN C) 1000 MG tablet Take 1,000 mg by mouth daily.    aspirin EC 81 MG tablet Take 81 mg by mouth daily.    Calcium Carb-Cholecalciferol (CALCIUM 600 + D PO) Take 1 tablet by mouth 2 (two) times daily.    dorzolamide-timolol (COSOPT) 22.3-6.8 MG/ML ophthalmic solution Place 1 drop into the left eye 2 (two) times daily.    Garlic 3235 MG CAPS Take 1,000 mg by mouth daily.    irbesartan-hydrochlorothiazide (AVALIDE) 150-12.5 MG tablet Take 1 tablet by mouth daily.    lacosamide 200 mg in sodium chloride 0.9 % 25 mL Inject 200 mg into the vein every 12 (twelve) hours.    latanoprost (XALATAN) 0.005 % ophthalmic solution Place 1 drop into  the left eye at bedtime.    levETIRAcetam 2,000 mg in sodium chloride 0.9 % 250 mL Inject 2,000 mg into the vein every 12 (twelve) hours.    metFORMIN (GLUCOPHAGE) 500 MG tablet Take 500-1,000 mg by mouth See admin instructions. Take 1000 mg by mouth in the morning and 500 mg in the evening    metoprolol (LOPRESSOR) 100 MG tablet Take 200 mg by mouth at bedtime.    Multiple Vitamin  (MULTIVITAMIN WITH MINERALS) TABS tablet Take 1 tablet by mouth daily.    Nutritional Supplements (FEEDING SUPPLEMENT, JEVITY 1.2 CAL,) LIQD Place 1,000 mLs into feeding tube continuous.  0  Nutritional Supplements (FEEDING SUPPLEMENT, PROSOURCE TF,) liquid Place 45 mLs into feeding tube 3 (three) times daily.    pantoprazole sodium (PROTONIX) 40 mg PACK Place 20 mLs (40 mg total) into feeding tube at bedtime. 30 packet   vitamin E 180 MG (400 UNITS) capsule Take 400 Units by mouth daily.     Drug Regimen Review  Drug regimen was reviewed and remains appropriate with no significant issues identified  Home:     Functional History: Independent PTA  Functional Status:  Mobility: Min assist with bed mobility Mod assist to stand at EOB    ADL:    Cognition: Cognition Overall Cognitive Status: Impaired/Different from baseline Arousal/Alertness: Awake/alert Cognition Overall Cognitive Status: Impaired/Different from baseline Following Commands: Follows one step commands consistently  Physical Exam: There were no vitals taken for this visit. Physical Exam Vitals reviewed.  Constitutional:      Appearance: Normal appearance. She is obese.  HENT:     Head: Normocephalic and atraumatic.     Right Ear: External ear normal.     Left Ear: External ear normal.     Nose: Nose normal.  Eyes:     General:        Right eye: No discharge.        Left eye: No discharge.     Extraocular Movements: Extraocular movements intact.     Comments: Left eye w/Exophthalmus, unable to move beyond midline with few beats nystagmus.   Cardiovascular:     Rate and Rhythm: Normal rate and regular rhythm.  Pulmonary:     Effort: Pulmonary effort is normal. No respiratory distress.     Breath sounds: No stridor.  Abdominal:     General: Abdomen is flat. Bowel sounds are normal. There is no distension.     Comments: Multiple ecchymotic areas on abdomen.   Musculoskeletal:     Cervical back: Normal  range of motion and neck supple.     Comments: No edema or tenderness in extremities  Skin:    General: Skin is warm and dry.  Neurological:     Mental Status: She is alert. She is disoriented.     Comments: Alert and oriented x1 Motor: Limited due to cognition, however left side appears weaker than right  Unable to see 2 fingers in front of her face and could not see me on the left. Left inattention with delayed output.  She was able to follow occasional one step motor commands with tactile cues.    Psychiatric:        Mood and Affect: Affect is blunt.        Speech: Speech is delayed.        Behavior: Behavior is slowed.        Cognition and Memory: Cognition is impaired.    Results for orders placed or performed during the  hospital encounter of 06/04/21 (from the past 48 hour(s)) Basic metabolic panel     Status: Abnormal  Collection Time: 06/16/21  4:39 AM Result Value Ref Range  Sodium 135 135 - 145 mmol/L  Potassium 3.4 (L) 3.5 - 5.1 mmol/L  Chloride 99 98 - 111 mmol/L  CO2 24 22 - 32 mmol/L  Glucose, Bld 204 (H) 70 - 99 mg/dL   Comment: Glucose reference range applies only to samples taken after fasting for at least 8 hours.  BUN 33 (H) 8 - 23 mg/dL  Creatinine, Ser 0.73 0.44 - 1.00 mg/dL  Calcium 8.9 8.9 - 10.3 mg/dL  GFR, Estimated >60 >60 mL/min   Comment: (NOTE) Calculated using the CKD-EPI Creatinine Equation (2021)   Anion gap 12 5 - 15   Comment: Performed at [facility] [address], [place], [place] [address] CBC     Status: Abnormal  Collection Time: 06/16/21  4:39 AM Result Value Ref Range  WBC 10.7 (H) 4.0 - 10.5 K/uL  RBC 4.48 3.87 - 5.11 MIL/uL  Hemoglobin 12.8 12.0 - 15.0 g/dL  HCT 39.8 36.0 - 46.0 %  MCV 88.8 80.0 - 100.0 fL  MCH 28.6 26.0 - 34.0 pg  MCHC 32.2 30.0 - 36.0 g/dL  RDW 16.8 (H) 11.5 - 15.5 %  Platelets 390 150 - 400 K/uL  nRBC 0.0 0.0 - 0.2 %   Comment: Performed at [facility], [place] [address] [place], [place]  [address]  No results found.     Medical Problem List and Plan: 1.  Deficits with mobility, swallowing, cognition secondary to seizures status post meningioma resection.  -patient may not shower  -ELOS/Goals: 14-17 days/Supervision/Min A  Admit to CIR 2.  Antithrombotics: -DVT/anticoagulation:  Pharmaceutical: Lovenox  -antiplatelet therapy: N/A 3. Pain Management: PRN meds 4. Mood: LCSW to follow for evaluation and support.   -antipsychotic agents: N/A 5. Neuropsych: This patient is not capable of making decisions on her own behalf. 6. Skin/Wound Care: Routine pressure relief measures.  7. Fluids/Electrolytes/Nutrition: Monitor I/Os  CMP ordered 8. HTN: Monitor BP TID--continue Losartan, amlodipine, hydralazine.  Monitor with increased mobility 9.T2DM: Hgb A1C-6.6 and well controlled on home regimen 1000 mg am/500 mg pm  --Will discontinue Lantus after pm dose. Resume metformin as will off steroids.   --Monitor BS ac/hs and use SSI prn   Monitor with increased mobility 10. Cortical blindness: Will need assistance with meals and adaptive equipment.  11.  Hypokalemia  CMP ordered for tomorrow  Bary Leriche, PA-C 06/16/2021  I have personally performed a face to face diagnostic evaluation, including, but not limited to relevant history and physical exam findings, of this patient and developed relevant assessment and plan.  Additionally, I have reviewed and concur with the physician assistant's documentation above.  Delice Lesch, MD, ABPMR  The patient's status has not changed. Any changes from the pre-admission screening or documentation from the acute chart are noted above.   Delice Lesch, MD, ABPMR

## 2021-06-16 NOTE — Progress Notes (Signed)
PMR Admission Coordinator Pre-Admission Assessment   Patient: Olivia Shah is an 65 y.o., female MRN: [id number] DOB: 01/18/1956 Height:   Weight:     Insurance Information HMO:     PPO:      PCP:      IPA:      80/20:      OTHER:  PRIMARY: Medicare A and B      Policy#: [id number]      Subscriber:  CM Name:       Phone#:      Fax#:  Pre-Cert#: verified online      Employer:  Benefits:  Phone #:      Name:  Eff. Date: A and B 03/28/2019     Deduct: $1556      Out of Pocket Max: n/a      Life Max: n/a CIR: 100%      SNF: 20 full days Outpatient: 80%     Co-Ins: 20% Home Health: 100%      Co-Pay:  DME: 80%     Co-Ins: 20% Providers:  SECONDARY: A[employer]      Policy#: [id number]     Phone#:    Financial Counselor:       Phone#:    The "Data Collection Information Summary" for patients in Inpatient Rehabilitation Facilities with attached "Privacy Act Statement-Health Care Records" was provided and verbally reviewed with: Patient and Family   Emergency Contact Information Contact Information       Name Relation Home Work Mobile    GARDEN,TIFFANY Niece     [phone]    Alston,Shirley Sister [phone]   [phone]           Current Medical History  Patient Admitting Diagnosis: 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  She admitted to [facility] for surgical resection on 8/22 per Dr. Ostergard.  Post op course complicated by status epilepticus, and multiple acute CVAs.  She failed extubation x2 and was ultimately trached on 05/29/2021.  [facility].  She was decannulated on 9/11.  Therapy evaluations were completed and she was recommended for CIR.       Patient's medical record from [facility] has been reviewed by the rehabilitation admission coordinator and physician.   Past Medical History      Past Medical History:  Diagnosis Date   Arthritis     Diabetes mellitus without complication (HCC)     Hypertension        Has the patient had major surgery during 100 days prior to admission? Yes   Family History   family history is not on file.   Current Medications No current facility-administered medications for this encounter.   Patients Current Diet:  Diet Order                  DIET - DYS 1 Room service appropriate? Yes with Assist; Fluid consistency: Thin  Diet effective now                         Precautions / Restrictions n/a      Has the patient had 2 or more falls   or a fall with injury in the past year? Yes   Prior Activity Level Community (5-7x/wk): independent prior to admission, working as a church receptionist, driving, no DME used   Prior Functional Level Self Care: Did the patient need help bathing, dressing, using the toilet or eating? Independent   Indoor Mobility: Did the patient need assistance with walking from room to room (with or without device)? Independent   Stairs: Did the patient need assistance with internal or external stairs (with or without device)? Independent   Functional Cognition: Did the patient need help planning regular tasks such as shopping or remembering to take medications? Independent   Patient Information Are you of Hispanic, Latino/a,or Spanish origin?: X. Patient unable to respond What is your race?: X. Patient unable to respond Do you need or want an interpreter to communicate with a doctor or health care staff?: 9. Unable to respond   Patient's Response To:  Health Literacy and Transportation Is the patient able to respond to health literacy and transportation needs?: No   Home Assistive Devices / Equipment Home Assistive Devices/Equipment:  None Home Equipment: Toilet riser   Prior Device Use: Indicate devices/aids used by the patient prior to current illness, exacerbation or injury? None of the above    Home Assistive Devices / Equipment None Toilet riser   Prior Device Use: Indicate devices/aids used by the patient prior to current illness, exacerbation or injury? None of the above   Prior Functional Level Current Functional Level  Bed Mobility  Independent  Min assist   Transfers  Independent  Mod assist   Mobility - Walk/Wheelchair  Independent      Upper Body Dressing  Independent  Mod assist   Lower Body Dressing  Independent  Max assist   Grooming  Independent  Min assist   Eating/Drinking  Independent      Toilet Transfer  Independent  Mod assist   Bladder Continence   continent  incontinent   Bowel Management  continent  incontinent   Stair Climbing  Independent      Communication     min assist   Memory     max assist     Special needs/care consideration Skin decannulation site, surgical site to scalp and Diabetic management yes    Previous Home Environment (from acute therapy documentation) Living Arrangements: Alone Available Help at Discharge: Family, Available 24 hours/day Type of Home: House Home Layout: One level Home Access: Stairs to enter Entrance Stairs-Rails: Right Entrance Stairs-Number of Steps: 3 Bathroom Shower/Tub: Tub/shower unit, Curtain Bathroom Toilet: Standard Bathroom Accessibility: Yes How Accessible: Accessible via walker Home Care Services: No   Discharge Living Setting Plans for Discharge Living Setting: Patient's home (family will come stay with her) Type of Home at Discharge: House Discharge Home Layout: One level Discharge Home Access: Stairs to enter Entrance Stairs-Rails: Right Entrance Stairs-Number of Steps: 3 Discharge Bathroom Shower/Tub: Tub/shower unit Discharge Bathroom Toilet: Standard Discharge Bathroom  Accessibility: Yes How Accessible: Accessible via walker Does the patient have any problems obtaining your medications?: No   Social/Family/Support Systems Anticipated Caregiver: Tiffany Garden (main contact, sister), Shirley Alston (sister) Anticipated Caregiver's Contact Information: Tiffany [phone]; Shirley [phone] Ability/Limitations of Caregiver: min assist Caregiver Availability: 24/7 Discharge Plan Discussed with Primary Caregiver: Yes Is Caregiver In Agreement with Plan?: Yes Does Caregiver/Family have Issues with Lodging/Transportation while Pt is in Rehab?: No   Goals Patient/Family Goal for Rehab: PT/OT supervision to mod I, SLP min assist Expected length of stay: 14-18 days   Additional Information: [facility] Pt/Family Agrees to Admission and willing to participate: Yes Program Orientation Provided & Reviewed with Pt/Caregiver Including Roles  & Responsibilities: Yes   Decrease burden of Care through IP rehab admission: no   Possible need for SNF placement upon discharge: n/a    Patient Condition: I have reviewed medical records from [facility], spoken with CM, and patient and family member. I met with patient at the bedside and discussed via phone for inpatient rehabilitation assessment.  Patient will benefit from ongoing PT, OT, and SLP, can actively participate in 3 hours of therapy a day 5 days of the week, and can make measurable gains during the admission.  Patient will also benefit from the coordinated team approach during an Inpatient Acute Rehabilitation admission.  The patient will receive intensive therapy as well as Rehabilitation physician, nursing, social worker, and care management interventions.  Due to bladder management, bowel management, safety, skin/wound care, disease management, medication administration, pain management, and patient education the patient requires 24 hour a day rehabilitation nursing.  The patient is currently  mod assist with mobility and basic ADLs.  Discharge setting and therapy post discharge at home with home health is anticipated.  Patient has agreed to participate in the Acute Inpatient Rehabilitation Program and will admit today.   Preadmission Screen Completed By:  Taiquan Campanaro E Kaeya Schiffer, PT, DPT 06/16/2021 1:15 PM ______________________________________________________________________   Discussed status with Dr. Patel on 06/16/21  at 1:18 PM  and received approval for admission today.  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 Yes Co-Morbidities requiring supervision/potential complications: Diabetes mellitus type 2, hypertension, congenital right eye blindness, glaucoma Due to bladder management, bowel management, safety, disease management, medication administration, and patient education, does the patient require 24 hr/day rehab nursing? Yes Does the patient require coordinated care of a physician, rehab nurse, PT, OT, and SLP to address physical and functional deficits in the context of the above medical diagnosis(es)? Yes Addressing deficits in the following areas: balance, endurance, locomotion, strength, transferring, bathing, dressing, toileting, cognition, and psychosocial support Can the patient actively participate in an intensive therapy program of at least 3 hrs of therapy 5 days a week? Yes The potential for patient to make measurable gains while on inpatient rehab is excellent Anticipated functional outcomes upon discharge from inpatient rehab: supervision and min assist PT, supervision and min assist OT, supervision and min assist SLP Estimated  rehab length of stay to reach the above functional goals is: 16-18 days. Anticipated discharge destination: Home 10. Overall Rehab/Functional Prognosis: good  MD Signature Ankit Patel, MD, ABPMR 

## 2021-06-16 NOTE — Progress Notes (Signed)
Patient admitted to CIR this afternoon. Sleepy, no complaints of pain, cooperative with care. Call light and personal belongings within reach. Angie Fava

## 2021-06-17 DIAGNOSIS — R5381 Other malaise: Principal | ICD-10-CM

## 2021-06-17 LAB — COMPREHENSIVE METABOLIC PANEL
ALT: 36 U/L (ref 0–44)
AST: 15 U/L (ref 15–41)
Albumin: 2.7 g/dL — ABNORMAL LOW (ref 3.5–5.0)
Alkaline Phosphatase: 94 U/L (ref 38–126)
Anion gap: 8 (ref 5–15)
BUN: 24 mg/dL — ABNORMAL HIGH (ref 8–23)
CO2: 29 mmol/L (ref 22–32)
Calcium: 9 mg/dL (ref 8.9–10.3)
Chloride: 101 mmol/L (ref 98–111)
Creatinine, Ser: 0.89 mg/dL (ref 0.44–1.00)
GFR, Estimated: >60 mL/min (ref >60)
Glucose, Bld: 165 mg/dL — ABNORMAL HIGH (ref 70–99)
Potassium: 4.1 mmol/L (ref 3.5–5.1)
Sodium: 138 mmol/L (ref 135–145)
Total Bilirubin: 0.3 mg/dL (ref 0.3–1.2)
Total Protein: 5.8 g/dL — ABNORMAL LOW (ref 6.5–8.1)

## 2021-06-17 LAB — GLUCOSE, CAPILLARY
Glucose-Capillary: 129 mg/dL — ABNORMAL HIGH (ref 70–99)
Glucose-Capillary: 177 mg/dL — ABNORMAL HIGH (ref 70–99)
Glucose-Capillary: 165 mg/dL — ABNORMAL HIGH (ref 70–99)

## 2021-06-17 LAB — CBC WITH DIFFERENTIAL/PLATELET
Abs Immature Granulocytes: 0.07 10*3/uL (ref 0.00–0.07)
Basophils Absolute: 0 10*3/uL (ref 0.0–0.1)
Basophils Relative: 0 %
Eosinophils Absolute: 0.3 10*3/uL (ref 0.0–0.5)
Eosinophils Relative: 3 %
HCT: 40.3 % (ref 36.0–46.0)
Hemoglobin: 12.7 g/dL (ref 12.0–15.0)
Immature Granulocytes: 1 %
Lymphocytes Relative: 62 %
Lymphs Abs: 6.6 10*3/uL — ABNORMAL HIGH (ref 0.7–4.0)
MCH: 28.3 pg (ref 26.0–34.0)
MCHC: 31.5 g/dL (ref 30.0–36.0)
MCV: 89.8 fL (ref 80.0–100.0)
Monocytes Absolute: 0.7 10*3/uL (ref 0.1–1.0)
Monocytes Relative: 6 %
Neutro Abs: 3 10*3/uL (ref 1.7–7.7)
Neutrophils Relative %: 28 %
Platelets: 336 10*3/uL (ref 150–400)
RBC: 4.49 MIL/uL (ref 3.87–5.11)
RDW: 17.2 % — ABNORMAL HIGH (ref 11.5–15.5)
WBC: 10.7 10*3/uL — ABNORMAL HIGH (ref 4.0–10.5)
nRBC: 0 % (ref 0.0–0.2)

## 2021-06-17 MED ORDER — METHYLPHENIDATE HCL 5 MG PO TABS
5.0000 mg | ORAL_TABLET | Freq: Two times a day (BID) | ORAL | Status: DC
  Administered 2021-06-17 – 2021-07-08 (×40): 5 mg via ORAL
  Filled 2021-06-17 (×41): qty 1

## 2021-06-17 MED ORDER — ACETAMINOPHEN 325 MG PO TABS: 325.0000 mg | ORAL_TABLET | ORAL | Status: DC | PRN

## 2021-06-17 NOTE — Progress Notes (Signed)
Inpatient Rehabilitation Medication Review by a Pharmacist  A complete drug regimen review was completed for this patient to identify any potential clinically significant medication issues.  High Risk Drug Classes Is patient taking? Indication by Medication  Antipsychotic Yes Prochlorperazine PRN nausea  Anticoagulant Yes Enoxaparin for DVT prophylaxis  Antibiotic No   Opioid No   Antiplatelet Yes Aspirin 81mg  for primary CV prophylaxis   Hypoglycemics/insulin No   Vasoactive Medication Yes Amlodipine, hydralazine, losartan, metoprolol for blood pressure control   Chemotherapy No   Other Yes Levetiracetam and lacosamide for seizure treatment and prophylaxis      Type of Medication Issue Identified Description of Issue Recommendation(s)  Drug Interaction(s) (clinically significant)     Duplicate Therapy     Allergy     No Medication Administration End Date     Incorrect Dose     Additional Drug Therapy Needed  Prostat supplement (given at [facility]) and Home calcium and vitamin D not resumed  Consider restarting   Significant med changes from prior encounter (inform family/care partners about these prior to discharge). Irbesartan-HCTZ stopped (replaced with losartan) inform family/care partners about these prior to discharge  Other       Clinically significant medication issues were identified that warrant physician communication and completion of prescribed/recommended actions by midnight of the next day:  No  Name of provider notified for urgent issues identified:   Provider Method of Notification:    Time spent performing this drug regimen review (minutes):  20 minutes  Benetta Spar, PharmD, BCPS, [facility] Clinical Pharmacist  Please check AMION for all [place] phone numbers After 10:00 PM, call [place]

## 2021-06-17 NOTE — Progress Notes (Signed)
Inpatient Rehabilitation Care Coordinator Assessment and Plan Patient Details  Name: Olivia Shah MRN: [id number] Date of Birth: 03-01-1956  Today's Date: 06/17/2021  Hospital Problems: Principal Problem:   Debility  Past Medical History:  Past Medical History:  Diagnosis Date   Arthritis    Diabetes mellitus without complication ([place])    Hypertension    Past Surgical History:  Past Surgical History:  Procedure Laterality Date   APPLICATION OF CRANIAL NAVIGATION N/A 05/18/2021   Procedure: APPLICATION OF CRANIAL NAVIGATION;  Surgeon: Judith Part, MD;  Location: [facility];  Service: Neurosurgery;  Laterality: N/A;   BREAST BIOPSY Right    CHOLECYSTECTOMY     CRANIOTOMY Right 05/18/2021   Procedure: Right craniotomy for tumor resection;  Surgeon: Judith Part, MD;  Location: [facility];  Service: Neurosurgery;  Laterality: Right;   EYE SURGERY     as a child   HERNIA REPAIR     Social History:  reports that she has never smoked. She has never used smokeless tobacco. No history on file for alcohol use and drug use.  Family / Support Systems Marital Status: Single Patient Roles: Other (Comment) (Sibling and Aunt along with employee) Other Supports: Angelique Holm [phone] Anticipated Caregiver: Jonelle Sidle and Shirley-sister to saty with her at her home Ability/Limitations of Caregiver: supervision-min assist level Caregiver Availability: 24/7 Family Dynamics: Close knit family who have always been there for one another. Pt has done a lot for them and they are returning the favor now  Social History Preferred language: English Religion: Christian Cultural Background: No issues Education: [place] - How often do you need to have someone help you when you read instructions, pamphlets, or other written material from your doctor or pharmacy?: Rarely Writes: Yes Employment Status: Employed Name of Employer: [employer] Return to Work Plans: Depends upon her Geneticist, molecular Issues: No issues Guardian/Conservator: Tiffany niece is her POA and currently MD feels she is not capable of making her own decisions while here. So will look toward her neice if any decisions need to be made   Abuse/Neglect Abuse/Neglect Assessment Can Be Completed: Yes Physical Abuse: Denies Verbal Abuse: Denies Sexual Abuse: Denies Exploitation of patient/patient's resources: Denies Self-Neglect: Denies  Patient response to: Social Isolation - How often do you feel lonely or isolated from those around you?: Never  Emotional Status Pt's affect, behavior and adjustment status: Pt wants to get well and be able to take care of herself, she is not used to being cared for by others. 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 Had been since birth so adapted Psychiatric History: No history deferred depression screening at this time due to adjusting to rehab and therapy schedule. Do feel may benefit from seeing neuro-psych while here for coping. Substance Abuse History: No issues  Patient / Family Perceptions, Expectations & Goals Pt/Family understanding of illness & functional limitations: Pt has a basic understanding but niece has been talking with MD and feels she has a good understanding of her treatment plan going forward and waht to expect. They are all hopeful she will do well here Premorbid pt/family roles/activities: Aunt, sister, employee, home owner, etc Anticipated changes in roles/activities/participation: resume Pt/family expectations/goals: Pt states: " I want to do well here."  Niece states: " We hope she can recover and do well  there, we know she likes her independence."  [employer]: None Premorbid Home Care/DME Agencies:  None Transportation available at discharge: Family will transport Is the patient able to respond to transportation needs?: Yes In the past 12 months, has lack of transportation kept you from medical appointments or from getting medications?: No In the past 12 months, has lack of transportation kept you from meetings, work, or from getting things needed for daily living?: No Resource referrals recommended: Neuropsychology  Discharge Planning Living Arrangements: Alone Support Systems: Other relatives, Water engineer, Social worker community Type of Residence: Private residence Insurance Resources: [employer], Multimedia programmer (specify) Web designer) Financial Resources: Fish farm manager, Employment Financial Screen Referred: No Living Expenses: Own Money Management: Patient Does the patient have any problems obtaining your medications?: No Home Management: Self Patient/Family Preliminary Plans: Return to her home and have family coming in, aware she will need 24/7 care at the beginning and hopefully will decrease from there. Evaluations taking place today so will know more after. Care Coordinator Barriers to Discharge: Other (comments) Care Coordinator Barriers to Discharge Comments: blind Care Coordinator Anticipated Follow Up Needs: HH/OP  Clinical Impression Pleasant female who is willing to work and recover from her surgery. She is hopeful she will do well here. Her niece and sister are very involved and will be providing care at discharge. Will await therapy team evaluations and work on discharge needs. Niece wants to be updated by MD regularly regarding pt's medical issues.  Elease Hashimoto 06/17/2021, 11:08 AM

## 2021-06-17 NOTE — Progress Notes (Signed)
Niece noted patient repeatedly lifting hand to mouth as if drinking something. Patient said "it's red." Patient told her niece the patient's hand was turning red. The niece states this is something that has not happened before. Angie Fava

## 2021-06-17 NOTE — Evaluation (Signed)
Occupational Therapy Assessment and Plan  Patient Details  Name: Olivia Shah MRN: [id number] Date of Birth: 06-11-1956  OT Diagnosis: abnormal posture, blindness and low vision, cognitive deficits, disturbance of vision, and muscle weakness (generalized) Rehab Potential: Rehab Potential (ACUTE ONLY): Fair ELOS: 3 weeks   Today's Date: 06/17/2021 OT Individual Time: 0800-0900 OT Individual Time Calculation (min): 60 min     Hospital Problem: Principal Problem:   Debility   Past Medical History:  Past Medical History:  Diagnosis Date   Arthritis    Diabetes mellitus without complication ([place])    Hypertension    Past Surgical History:  Past Surgical History:  Procedure Laterality Date   APPLICATION OF CRANIAL NAVIGATION N/A 05/18/2021   Procedure: APPLICATION OF CRANIAL NAVIGATION;  Surgeon: Judith Part, MD;  Location: [facility];  Service: Neurosurgery;  Laterality: N/A;   BREAST BIOPSY Right    CHOLECYSTECTOMY     CRANIOTOMY Right 05/18/2021   Procedure: Right craniotomy for tumor resection;  Surgeon: Judith Part, MD;  Location: [facility];  Service: Neurosurgery;  Laterality: Right;   EYE SURGERY     as a child   HERNIA REPAIR      Assessment & Plan Clinical Impression: Patient is a 65 year old LH-female with history of T2DM, HTN, congenital right sided blindness, glaucoma, progressive loss of left vision and 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  History taken from chart review due to cognition.  She was evaluated by Dr. Venetia Constable and admitted on 05/18/2021 due to progressive loss of vision for gross total resection of tumor. Hospital course complicated by status epilepticus requiring addition of multiple meds as well as intubation for airway protection. She failed extubation attempts X 2 due to stridor requiring tracheostomy and was tolerating attempts at wean to ATC. She was limited by lethargy, headaches and recurrent right focal motor seizures. Medications adjusted  with resolution of seizures.    She was [facility] for management and wean. Her trach found to have been dislodged on 09/11. She was treated with racemic epi and steroids with need for replacement. Respiratory status has been stable and she has been weaned off oxygen. Solumedrol weaned to prednisone and stops today. Blood pressures have been labile in the 5 days requiring up-tritration/addition of meds for better control. MBS done and she is tolerating D1, thins with aspiration precautions. Patient is cortically blind with cognitive deficits and dysphagia.  Therapy has been ongoing and is working on pregait activity. CIR was recommended due to functional decline. Patient transferred to CIR on 06/16/2021 .    Patient currently requires  Max A to Total A grossly  with basic self-care skills secondary to muscle weakness, impaired timing and sequencing, unbalanced muscle activation, decreased coordination, and decreased motor planning, cortical blindness, and decreased initiation, decreased attention, decreased awareness, decreased problem solving, decreased safety awareness, decreased memory, and delayed processing.  Prior to hospitalization, patient could complete ADLs/IADLs with independence.  Patient will benefit from skilled intervention to decrease level of assist with basic self-care skills and increase independence with basic self-care skills prior to discharge home with care partner.  Anticipate patient will require 24 hour supervision and minimal physical assistance and follow up home health.  OT - End of Session Endurance Deficit: Yes Endurance Deficit Description: Limited by lethargy at start and end of session. OT Assessment Rehab Potential (ACUTE ONLY): Fair OT Barriers to Discharge: Incontinence OT Patient demonstrates impairments in the following area(s): Balance;Cognition;Endurance;Motor;Safety;Sensory;Vision OT Basic ADL's Functional  Problem(s):  Eating;Grooming;Bathing;Dressing;Toileting OT Transfers Functional Problem(s): Toilet;Tub/Shower OT Plan OT Intensity: Minimum of 1-2 x/day, 45 to 90 minutes OT Frequency: 5 out of 7 days OT Duration/Estimated Length of Stay: 3 weeks OT Treatment/Interventions: Balance/vestibular training;Cognitive remediation/compensation;Community reintegration;Discharge planning;DME/adaptive equipment instruction;Functional mobility training;Neuromuscular re-education;Patient/family education;Psychosocial support;Self Care/advanced ADL retraining;Therapeutic Activities;Therapeutic Exercise;UE/LE Strength taining/ROM;UE/LE Coordination activities;Visual/perceptual remediation/compensation;Wheelchair propulsion/positioning OT Self Feeding Anticipated Outcome(s): Independent OT Basic Self-Care Anticipated Outcome(s): Supervision to [employer] A OT Toileting Anticipated Outcome(s): Min A OT Bathroom Transfers Anticipated Outcome(s): Min A OT Recommendation Recommendations for Other Services: Speech consult Patient destination:  (TBD) Follow Up Recommendations: Home health OT;24 hour supervision/assistance Equipment Recommended: 3 in 1 bedside comode;To be determined   OT Evaluation Precautions/Restrictions  Precautions Precautions: Fall Precaution Comments: Cortical blindness, seizure preacutions, high fall risk Restrictions Weight Bearing Restrictions: No General Chart Reviewed: Yes Additional Pertinent History: PMHx significant for congenitally blind OD, arthritis, DM HTN PT Missed Treatment Reason: Patient fatigue Family/Caregiver Present: No Vital Signs Therapy Vitals BP: 112/69 Patient Position (if appropriate): Lying Oxygen Therapy SpO2: 100 % O2 Device: Room Air Pain Pain Assessment Pain Scale: Faces Pain Score: 0-No pain Faces Pain Scale: No hurt Home Living/Prior Functioning Home Living Family/patient expects to be discharged to:: Private residence Living Arrangements: Alone Available  Help at Discharge: Family, Available 24 hours/day Type of Home: House Home Access: Stairs to enter [employer] of Steps: 3 Entrance Stairs-Rails: Right Home Layout: One level Bathroom Shower/Tub: Tub/shower unit, Architectural technologist: Standard Bathroom Accessibility: Yes Prior Function Comments: PLOF pulled from chart review and admissions coordinator. Pt unable to answer questions on evaluation without family present to confirm Vision Baseline Vision/History: 3 Glaucoma Ability to See in Adequate Light: 2 Moderately impaired Patient Visual Report: Other (comment) (Unable to state) Vision Assessment?: Vision impaired- to be further tested in functional context Additional Comments: Cortical blindenss, able to find spoon and cup on tray table. Also able to locate washcloth presented in central vision several times. Will continue to assess. Perception  Perception: Impaired Inattention/Neglect: Impaired-to be further tested in functional context Comments: Appears to attend to R and L side of body during bathing/dressing tasks. Initially hesitant to move RUE requiring increased cues. Praxis Praxis: Impaired Praxis Impairment Details: Motor planning;Initiation Praxis-Other Comments: Max cues to sequence UB/LB bathing/dressing. Max cues also required for sequencing of new tasks including proper hand placement on Stedy despite max multimodal cueing. Decreased initiation. Cognition Overall Cognitive Status: Impaired/Different from baseline Arousal/Alertness: Lethargic Orientation Level: Person;Situation Year: 2021 Month:  (Does not state) Day of Week:  ("Monday"; correct day of the week is Wednesday) Memory: Impaired Memory Impairment: Decreased recall of new information;Decreased short term memory Immediate Memory Recall: Sock;Blue;Bed Memory Recall Sock: Not able to recall Memory Recall Blue: Not able to recall Memory Recall Bed: Not able to recall Attention:  Focused Focused Attention: Impaired Focused Attention Impairment: Verbal basic;Functional basic Awareness: Impaired Awareness Impairment: Intellectual impairment Problem Solving: Impaired Problem Solving Impairment: Functional basic;Verbal basic Executive Function: Self Correcting;Self Monitoring;Organizing;Sequencing;Decision Making Sequencing: Impaired Sequencing Impairment: Verbal basic;Functional basic Organizing: Impaired Organizing Impairment: Verbal basic;Functional basic Decision Making: Impaired Decision Making Impairment: Verbal basic;Functional basic Self Monitoring: Impaired Self Monitoring Impairment: Verbal basic;Functional basic Self Correcting: Impaired Self Correcting Impairment: Functional basic Behaviors: Perseveration;Other (comment) (Flat affect) Safety/Judgment: Impaired Sensation Sensation Light Touch: Impaired by gross assessment (Reports numbness in BUE) Hot/Cold: Appears Intact Proprioception: Impaired by gross assessment Stereognosis: Not tested Coordination Gross Motor Movements are Fluid and Coordinated: No Fine Motor Movements are Fluid and Coordinated: No Coordination  and Movement Description: Difficulty managing ADL items requiring assist to squeeze excess water from washcloth and to apply soap. Able to manage utensils for scooping items on meal tray. Motor  Motor Motor: Abnormal postural alignment and control;Hemiplegia Motor - Skilled Clinical Observations: AROM appears WFL on L. Initially RUE resting in flexed position at elbow and internally rotated. With assessment of AROM patient able to maintian position of RUE in space against gravity, reach overhead, reach forward and extend elbow without external assist.  Trunk/Postural Assessment  Cervical Assessment Cervical Assessment: Within Functional Limits Thoracic Assessment Thoracic Assessment: Exceptions to [facility] (Rounded shoulders) Lumbar Assessment Lumbar Assessment: Exceptions to [facility]  (Posterior pelvic tilt) Postural Control Postural Control: Deficits on evaluation Righting Reactions: delayed and inadequate Protective Responses: delayed and inadequate  Balance Balance Balance Assessed: Yes Static Sitting Balance Static Sitting - Balance Support: Feet supported;No upper extremity supported Static Sitting - Level of Assistance: 5: Stand by assistance Static Sitting - Comment/# of Minutes: Able to maintain static sitting balance at EOB for >8-10 min with supervision A for safety. Dynamic Sitting Balance Dynamic Sitting - Balance Support: Feet supported Dynamic Sitting - Level of Assistance: 3: Mod assist Sitting balance - Comments: In unsupported sitting requires minA for static sitting due to poor postural control and immedate trunk/cervical flexion Extremity/Trunk Assessment RUE Assessment RUE Assessment: Exceptions to [facility] Passive Range of Motion (PROM) Comments: WFL Active Range of Motion (AROM) Comments: WFL with functional assessment. Initially maintained RUE flexed at elbow/digits and internally rotated. General Strength Comments: Unable to follow verbal commnads necessary for formal assessment. LUE Assessment LUE Assessment: Exceptions to [facility] Passive Range of Motion (PROM) Comments: WFL Active Range of Motion (AROM) Comments: WFL General Strength Comments: Unable to follow verbal commnads necessary for formal assessment.  Care Tool Care Tool Self Care Eating   Eating Assist Level: Minimal Assistance - Patient > 75%    Oral Care    Oral Care Assist Level: Minimal Assistance - Patient > 75%    Bathing   Body parts bathed by patient: Right arm;Left arm;Chest;Abdomen;Face Body parts bathed by helper: Front perineal area;Buttocks;Right lower leg;Left lower leg;Left upper leg;Right upper leg   Assist Level: Maximal Assistance - Patient 24 - 49%    Upper Body Dressing(including orthotics)   What is the patient wearing?: Pull over shirt   Assist Level:  Minimal Assistance - Patient > 75%    Lower Body Dressing (excluding footwear)   What is the patient wearing?: Incontinence brief;Pants Assist for lower body dressing: Total Assistance - Patient < 25%    Putting on/Taking off footwear   What is the patient wearing?: Socks;Shoes Assist for footwear: Dependent - Patient 0%       Care Tool Toileting Toileting activity   Assist for toileting: Dependent - Patient 0%     Care Tool Bed Mobility Roll left and right activity   Roll left and right assist level: 2 Helpers    Sit to lying activity   Sit to lying assist level: 2 Helpers    Lying to sitting on side of bed activity   Lying to sitting on side of bed assist level: the ability to move from lying on the back to sitting on the side of the bed with no back support.: 2 Helpers     Care Tool Transfers Sit to stand transfer Sit to stand activity did not occur: Safety/medical concerns      Chair/bed transfer Chair/bed transfer activity did not occur: Safety/medical concerns  Materials engineer transfer activity did not occur: Safety/medical concerns       Care Tool Cognition  Expression of Ideas and Wants Expression of Ideas and Wants: 2. Frequent difficulty - frequently exhibits difficulty with expressing needs and ideas  Understanding Verbal and Non-Verbal Content Understanding Verbal and Non-Verbal Content: 2. Sometimes understands - understands only basic conversations or simple, direct phrases. Frequently requires cues to understand   Memory/Recall Ability Memory/Recall Ability : None of the above were recalled   Refer to Care Plan for Long Term Goals  SHORT TERM GOAL WEEK 1 OT Short Term Goal 1 (Week 1): Patient will don UB clothing with Min A seated EOB. OT Short Term Goal 2 (Week 1): Patient will complet e1/3 parts of LB clothing at bed level with Mod A. OT Short Term Goal 3 (Week 1): Patient will complete sit to stand transfer with Max A and  LRAD.  Recommendations for other services: Other: TBD    Skilled Therapeutic Intervention Patient met lying supine in bed. Lethargic upon entry which improved significantly with changes to the environment and warm washcloth applied to face. No family present at bedside for education on purpose and role of OT, CIR, ELOS, POC, and recovery. Patient able to roll in supine with Mod A to R<>L, use of rail, cues for hand placement and use of chuck pad. Supine to EOB with Mod A at BLE and trunk (patient does not initiate movemement of RLE on command but this writer noted movement of all 4 extremities throughout evaluation. Patient also able to maintain flexed position of RLE in supine in prep for scoot toward HOB. Bathing/dressing completed at bed level/EOB. See below for ADL details. Patient greatly limited by decreased activity tolerance, decreased initiation, decreased cognition but demonstrating ability to follow 1-step verbal commands with multimodal cues, low vision secondary to cortical blindness but demonstrating ability to locate items on meal tray with increased time and multimodal cues and decreased activity tolerance exacerbated by lethargy. Patient would benefit from continued OT services to maximize safety/independence with self-care tasks in prep for safe d/c to next level of care.   ADL ADL Eating: Minimal assistance Where Assessed-Eating: Edge of bed Grooming: Minimal assistance Where Assessed-Grooming: Sitting at sink Upper Body Bathing: Moderate assistance Where Assessed-Upper Body Bathing: Other (Comment) (Long-sitting in supine) Lower Body Bathing: Maximal assistance Where Assessed-Lower Body Bathing: Bed level Upper Body Dressing: Minimal assistance Where Assessed-Upper Body Dressing: Other (Comment) (Long sitting in supine) Lower Body Dressing: Dependent Where Assessed-Lower Body Dressing: Bed level Toileting: Dependent;Other (Comment) (Incontinent) Where Assessed-Toileting: Bed  level Toilet Transfer: Not assessed Walk-In Shower Transfer: Unable to assess Mobility  Bed Mobility Bed Mobility: Rolling Right;Rolling Left;Supine to Sit;Sit to Supine Rolling Right: Maximal Assistance - Patient 25-49% Rolling Left: Maximal Assistance - Patient 25-49% Supine to Sit: Maximal Assistance - Patient - Patient 25-49% Sit to Supine: Maximal Assistance - Patient 25-49% Transfers Sit to Stand: Maximal Assistance - Patient 25-49% (Unable to come fully upright with 1 person assist but did stand 85% of the way.)   Discharge Criteria: Patient will be discharged from OT if patient refuses treatment 3 consecutive times without medical reason, if treatment goals not met, if there is a change in medical status, if patient makes no progress towards goals or if patient is discharged from hospital.  The above assessment, treatment plan, treatment alternatives and goals were discussed and mutually agreed upon: No family available/patient unable  Jamicia Haaland R [place]-Davis 06/17/2021, 12:55 PM

## 2021-06-17 NOTE — Evaluation (Signed)
Physical Therapy Assessment and Plan  Patient Details  Name: Olivia Shah MRN: [id number] Date of Birth: Feb 17, 1956  PT Diagnosis: Abnormal posture, Abnormality of gait, Cognitive deficits, Difficulty walking, Impaired cognition, and Muscle weakness Rehab Potential: Fair ELOS: 3 weeks   Today's Date: 06/17/2021 PT Individual Time: 1000-1030 PT Individual Time Calculation (min): 30 min    Hospital Problem: Principal Problem:   Debility   Past Medical History:  Past Medical History:  Diagnosis Date   Arthritis    Diabetes mellitus without complication ([place])    Hypertension    Past Surgical History:  Past Surgical History:  Procedure Laterality Date   APPLICATION OF CRANIAL NAVIGATION N/A 05/18/2021   Procedure: APPLICATION OF CRANIAL NAVIGATION;  Surgeon: Judith Part, MD;  Location: [facility];  Service: Neurosurgery;  Laterality: N/A;   BREAST BIOPSY Right    CHOLECYSTECTOMY     CRANIOTOMY Right 05/18/2021   Procedure: Right craniotomy for tumor resection;  Surgeon: Judith Part, MD;  Location: [place];  Service: Neurosurgery;  Laterality: Right;   EYE SURGERY     as a child   HERNIA REPAIR      Assessment & Plan Clinical Impression: Patient is a 65 year old LH-female with history of T2DM, HTN, congenital right sided blindness, glaucoma, progressive loss of left vision and 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  History taken from chart review due to cognition.  She was evaluated by Dr. Venetia Constable and admitted on 05/18/2021 due to progressive loss of vision for gross total resection of tumor. Hospital course complicated by status epilepticus requiring addition of multiple meds as well as intubation for airway protection. She failed extubation attempts X 2 due to stridor requiring tracheostomy and was tolerating attempts at wean to ATC. She was limited by lethargy, headaches and recurrent right focal motor seizures. Medications adjusted with resolution of seizures.    She  was transferred to [facility] 06/04/2021 for management and wean. Her trach found to have been dislodged on 09/11. She was treated with racemic epi and steroids with need for replacement. Respiratory status has been stable and she has been weaned off oxygen. Solumedrol weaned to prednisone and stops today. Blood pressures have been labile in the 5 days requiring up-tritration/addition of meds for better control. MBS done and she is tolerating D1, thins with aspiration precautions. Patient is cortically blind with cognitive deficits and dysphagia.  Therapy has been ongoing and is working on pregait activity. CIR was recommended due to functional decline. Patient transferred to CIR on 06/16/2021 .   Patient currently requires total with mobility secondary to muscle weakness, decreased cardiorespiratoy endurance, unbalanced muscle activation, decreased visual acuity, decreased visual perceptual skills, and field cut, decreased motor planning and ideational apraxia, decreased initiation, decreased attention, decreased awareness, decreased problem solving, decreased safety awareness, decreased memory, and delayed processing, and decreased sitting balance, decreased standing balance, decreased postural control, and decreased balance strategies.  Prior to hospitalization, patient was independent  with mobility and lived with   in a House home.  Home access is 3Stairs to enter.  Patient will benefit from skilled PT intervention to maximize safe functional mobility, minimize fall risk, and decrease caregiver burden for planned discharge home with 24 hour assist.  Anticipate patient will benefit from follow up [facility] at discharge.  PT - End of Session Activity Tolerance: Decreased this session;Tolerates < 10 min activity, no significant change in vital signs Endurance Deficit: Yes Endurance Deficit Description: Unable to maintain adequate alertness to assess  functional transfers and OOB mobility PT Assessment Rehab Potential  (ACUTE/IP ONLY): Fair PT Barriers to Discharge: Decreased caregiver support;Home environment access/layout;Incontinence;Insurance for SNF coverage;Weight;Lack of/limited family support;Other (comments) PT Barriers to Discharge Comments: Lethargy PT Patient demonstrates impairments in the following area(s): Balance;Behavior;Edema;Endurance;Motor;Nutrition;Perception;Safety;Sensory;Skin Integrity PT Transfers Functional Problem(s): Bed Mobility;Bed to Chair;Car PT Locomotion Functional Problem(s): Ambulation;Stairs;Wheelchair Mobility PT Plan PT Intensity: Minimum of 1-2 x/day ,45 to 90 minutes PT Frequency: 5 out of 7 days PT Duration Estimated Length of Stay: 3 weeks PT Treatment/Interventions: Ambulation/gait training;Cognitive remediation/compensation;Discharge planning;DME/adaptive equipment instruction;Functional mobility training;Pain management;Psychosocial support;Splinting/orthotics;Therapeutic Activities;UE/LE Strength taining/ROM;Visual/perceptual remediation/compensation;Wheelchair propulsion/positioning;UE/LE Coordination activities;Stair training;Skin care/wound management;Therapeutic Exercise;Patient/family education;Neuromuscular re-education;Functional electrical stimulation;Disease management/prevention;Community reintegration;Balance/vestibular training PT Transfers Anticipated Outcome(s): minA with LRAD PT Locomotion Anticipated Outcome(s): minA with LRAD PT Recommendation Recommendations for Other Services: Neuropsych consult Follow Up Recommendations: Home health PT;24 hour supervision/assistance Patient destination: Home Equipment Recommended: To be determined   PT Evaluation Precautions/Restrictions Precautions Precautions: Fall Precaution Comments: Cortical blindness, seizure preacutions, high fall risk Restrictions Weight Bearing Restrictions: No General Family/Caregiver Present: No Vital SignsTherapy Vitals BP: 112/69 Patient Position (if appropriate):  Lying Oxygen Therapy SpO2: 100 % O2 Device: Room Air Pain Pain Assessment Pain Scale: Faces Pain Score: 0-No pain Faces Pain Scale: No hurt Pain Interference Pain Interference Pain Effect on Sleep: 8. Unable to answer Pain Interference with Therapy Activities: 8. Unable to answer Pain Interference with Day-to-Day Activities: 8. Unable to answer Home Living/Prior Functioning Home Living Available Help at Discharge: Family;Available 24 hours/day Type of Home: House Home Access: Stairs to enter [employer] of Steps: 3 Entrance Stairs-Rails: Right Home Layout: One level Bathroom Shower/Tub: Tub/shower unit;Curtain Bathroom Toilet: Standard Bathroom Accessibility: Yes Prior Function Comments: PLOF pulled from chart review and admissions coordinator. Pt unable to answer questions on evaluation without family present to confirm Vision/Perception  Vision - Assessment Additional Comments: R eye blind at baseline, possible L visual field cut Perception Perception: Not tested (Needs further assessment due to limited participation and alertness on evaluation) Praxis Praxis: Not tested (Needs further assessment due to limited participation and alertness on evaluation)  Cognition Overall Cognitive Status: Impaired/Different from baseline Arousal/Alertness: Lethargic Orientation Level: Other (comment) (nonverbal, unable to respond to questions) Awareness: Impaired Problem Solving: Impaired Problem Solving Impairment: Functional basic;Verbal basic Safety/Judgment: Impaired Sensation Sensation Light Touch: Impaired by gross assessment Hot/Cold: Appears Intact Proprioception: Impaired by gross assessment Stereognosis: Not tested Coordination Gross Motor Movements are Fluid and Coordinated: No Fine Motor Movements are Fluid and Coordinated: No Coordination and Movement Description: Limited assessment due to significant lethargy at evaluation. From location of CVA's,  anticipate movement patterns dysfunction Motor  Motor Motor: Abnormal postural alignment and control;Hemiplegia Motor - Skilled Clinical Observations: Will need further assessment due lethgary at eval. Appears very deconditioning and global weakness, sit's EOB with "C shaped" spine with significant forward head posture   Trunk/Postural Assessment  Cervical Assessment Cervical Assessment: Exceptions to [facility] (significant forward head, especially while sitting EOB) Thoracic Assessment Thoracic Assessment: Exceptions to [facility] (rounded shoulders) Lumbar Assessment Lumbar Assessment: Exceptions to [facility] (posterior pelvic tilt) Postural Control Postural Control: Deficits on evaluation Righting Reactions: delayed and inadequate Protective Responses: delayed and inadequate  Balance Balance Balance Assessed: Yes Static Sitting Balance Static Sitting - Balance Support: Feet supported Static Sitting - Level of Assistance: 4: Min assist Dynamic Sitting Balance Dynamic Sitting - Balance Support: Feet supported Dynamic Sitting - Level of Assistance: 3: Mod assist Sitting balance - Comments: In unsupported sitting requires minA for static sitting due to  poor postural control and immedate trunk/cervical flexion Extremity Assessment      RLE Assessment RLE Assessment: Exceptions to [facility] General Strength Comments: Needs further asessment due to no active voluntary movement 2/2 lethargy LLE Assessment LLE Assessment: Exceptions to [facility] General Strength Comments: Needs further asessment due to no active voluntary movement 2/2 lethargy  Care Tool Care Tool Bed Mobility Roll left and right activity   Roll left and right assist level: 2 Helpers    Sit to lying activity   Sit to lying assist level: 2 Helpers    Lying to sitting on side of bed activity   Lying to sitting on side of bed assist level: the ability to move from lying on the back to sitting on the side of the bed with no back support.: 2  Helpers     Care Tool Transfers Sit to stand transfer Sit to stand activity did not occur: Safety/medical concerns      Chair/bed transfer Chair/bed transfer activity did not occur: Safety/medical concerns       Toilet transfer Toilet transfer activity did not occur: Safety/medical concerns      Scientist, product/process development transfer activity did not occur: Safety/medical concerns        Care Tool Locomotion Ambulation Ambulation activity did not occur: Safety/medical concerns        Walk 10 feet activity Walk 10 feet activity did not occur: Safety/medical concerns       Walk 50 feet with 2 turns activity Walk 50 feet with 2 turns activity did not occur: Safety/medical concerns      Walk 150 feet activity Walk 150 feet activity did not occur: Safety/medical concerns      Walk 10 feet on uneven surfaces activity Walk 10 feet on uneven surfaces activity did not occur: Safety/medical concerns      Stairs Stair activity did not occur: Safety/medical concerns        Walk up/down 1 step activity Walk up/down 1 step or curb (drop down) activity did not occur: Safety/medical concerns     Walk up/down 4 steps activity did not occuR: Safety/medical concerns  Walk up/down 4 steps activity      Walk up/down 12 steps activity Walk up/down 12 steps activity did not occur: Safety/medical concerns      Pick up small objects from floor Pick up small object from the floor (from standing position) activity did not occur: Safety/medical concerns      Wheelchair Is the patient using a wheelchair?: No          Wheel 50 feet with 2 turns activity      Wheel 150 feet activity        Refer to Care Plan for Long Term Goals  SHORT TERM GOAL WEEK 1 PT Short Term Goal 1 (Week 1): Pt will complete bed mobility with maxA of 1 person PT Short Term Goal 2 (Week 1): Pt will complete sit<>stand transfer with maxA of 1 person PT Short Term Goal 3 (Week 1): Pt will complete bed<>chair transfers with  maxA and LRAD PT Short Term Goal 4 (Week 1): Pt will demonstrate improved alertness and participation to actively engage in therapy sessions  Recommendations for other services: Neuropsych  Skilled Therapeutic Intervention Mobility Bed Mobility Bed Mobility: Rolling Right;Rolling Left;Supine to Sit;Sit to Supine Rolling Right: Dependent - Patient equal 0% Rolling Left: Dependent - Patient equal 0% Supine to Sit: 2 Helpers Sit to Supine: 2 Helpers Transfers Transfers:  (deferred due to safety  concerns from lethargy and inability to actively participate) Locomotion  Gait Ambulation: No Gait Gait: No Stairs / Additional Locomotion Stairs: No Wheelchair Mobility Wheelchair Mobility: No  Skilled Intervention: Pt supine in bed at start of session, sleeping soundly and requires max verbal and tactile cues to open eyes but then she immediately falls back asleep. Pt fully dressed from prior OT session and per conversation with OT, pt was overall modA for bed mobility and sit<>stand transfers. Pt maintained eyes closed throughout this session with PT and was unable to maintain adequate alertness to actively participate. She required +2 totalA for supine<>sit and at EOB, she requires minA due to poor postural control. Sitting EOB she demonstrates significant "C-posture" with flexed trunk and forward head. She's able to briefly hold her head up with support but then immediately returns to flexed position. Provided cold wash rag to attempt to wake her but no success. Vitals also assessed - BP 103/84, HR 95, O2 100% on room air. Deferred transfer attempts due to safety concerns and lethargy. Returned to supine with +2 totalA. Boosted up in bed for repositioning with +2 assist. Remained supine in bed with all needs met and bed alarm on. She missed 30 minutes of skilled therapy due to lethargy and inability to participate. Relayed to nursing safety concerns with transfers and to remain bed level only until  further PT evaluation.   Instructed pt in results of PT evaluation as detailed above, PT POC, rehab potential, rehab goals, and discharge recommendations. Additionally discussed CIR's policies regarding fall safety and use of chair alarm and/or quick release belt. Pt verbalized understanding and in agreement. Will update pt's family members as they become available.   Discharge Criteria: Patient will be discharged from PT if patient refuses treatment 3 consecutive times without medical reason, if treatment goals not met, if there is a change in medical status, if patient makes no progress towards goals or if patient is discharged from hospital.  The above assessment, treatment plan, treatment alternatives and goals were discussed and mutually agreed upon: by patient  Alger Simons 06/17/2021, 10:46 AM

## 2021-06-17 NOTE — Progress Notes (Signed)
Inpatient Rehabilitation  Patient information reviewed and entered into eRehab system by Wyona Neils M. Albeiro Trompeter, M.A., CCC/SLP, PPS Coordinator.  Information including medical coding, functional ability and quality indicators will be reviewed and updated through discharge.    

## 2021-06-17 NOTE — Progress Notes (Signed)
Patient ID: Olivia Shah, female   DOB: 1956-08-13, 65 y.o.   MRN: [id number] Met with the patient to introduce self and role. Patient with limited speech and delayed responses to questions. Denied vision deficit and kept arms pulled up to chest. Incision site to head healing. Scratches to buttocks, bruise to left shin; otherwise skin was good. Foam pads removed from bil heels.  Reviewed therapy schedule and plan of care however little response from patient. Continue to follow along to discharge to address educational needs. Collaborate with the team to facilitate preparation for discharge. Margarito Liner

## 2021-06-17 NOTE — Discharge Instructions (Addendum)
Inpatient Rehab Discharge Instructions  Olivia Shah Discharge date and time:    Activities/Precautions/ Functional Status: Activity: Activity as tolerated with assistance Diet: Soft foods--diabetic  restrictions. Pills have to be crushed and administered in puree.  Wound Care: keep wound clean and dry   Functional status:  ___ No restrictions     ___ Walk up steps independently _X__ 24/7 supervision/assistance   ___ Walk up steps with assistance ___ Intermittent supervision/assistance  ___ Bathe/dress independently ___ Walk with walker     __X_ Bathe/dress with assistance ___ Walk Independently    ___ Shower independently ___ Walk with assistance    ___ Shower with assistance _X__ No alcohol     ___ Return to work/school ________   Special Instructions:     COMMUNITY REFERRALS UPON DISCHARGE:    Home Health:   PT, OT, SP, AIDE                  Agency: [facility]   Phone: [phone]  Medical Equipment/Items Ordered: [place]                                                 Agency/Supplier: [facility]  [phone]    My questions have been answered and I understand these instructions. I will adhere to these goals and the provided educational materials after my discharge from the hospital.  Patient/Caregiver Signature _______________________________ Date __________  Clinician Signature _______________________________________ Date __________  Please bring this form and your medication list with you to all your follow-up doctor's appointments.

## 2021-06-17 NOTE — Progress Notes (Signed)
[facility] Individual Statement of Services  Patient Name:  Olivia Shah  Date:  06/17/2021  Welcome to the [facility].  Our goal is to provide you with an individualized program based on your diagnosis and situation, designed to meet your specific needs.  With this comprehensive rehabilitation program, you will be expected to participate in at least 3 hours of rehabilitation therapies Monday-Friday, with modified therapy programming on the weekends.  Your rehabilitation program will include the following services:  Physical Therapy (PT), Occupational Therapy (OT), Speech Therapy (ST), 24 hour per day rehabilitation nursing, Therapeutic Recreaction (TR), Neuropsychology, Care Coordinator, Rehabilitation Medicine, Nutrition Services, and Pharmacy Services  Weekly team conferences will be held on Tuesday to discuss your progress.  Your Inpatient Rehabilitation Care Coordinator will talk with you frequently to get your input and to update you on team discussions.  Team conferences with you and your family in attendance may also be held.  Expected length of stay: 3 weeks  Overall anticipated outcome: min assist level  Depending on your progress and recovery, your program may change. Your Inpatient Rehabilitation Care Coordinator will coordinate services and will keep you informed of any changes. Your Inpatient Rehabilitation Care Coordinator's name and contact numbers are listed  below.  The following services may also be recommended but are not provided by the [facility]:  [place] will be made to provide these services after discharge if needed.  Arrangements include referral to agencies that provide these services.  Your insurance has been verified to be:  [place] Your primary doctor is:  Ron Polite  Pertinent  information will be shared with your doctor and your insurance company.  Inpatient Rehabilitation Care Coordinator:  Ovidio Kin, [place] or Emilia Beck  Information discussed with and copy given to patient by: Elease Hashimoto, 06/17/2021, 11:10 AM

## 2021-06-17 NOTE — Evaluation (Signed)
Speech Language Pathology Assessment and Plan  Patient Details  Name: Olivia Shah MRN: [id number] Date of Birth: 07-Nov-1955  SLP Diagnosis: Cognitive Impairments;Speech and Language deficits;Dysphagia  Rehab Potential: Fair ELOS: 3 weeks    Today's Date: 06/17/2021 SLP Individual Time: 1300-1407 SLP Individual Time Calculation (min): 67 min   Hospital Problem: Principal Problem:   Debility  Past Medical History:  Past Medical History:  Diagnosis Date   Arthritis    Diabetes mellitus without complication ([place])    Hypertension    Past Surgical History:  Past Surgical History:  Procedure Laterality Date   APPLICATION OF CRANIAL NAVIGATION N/A 05/18/2021   Procedure: APPLICATION OF CRANIAL NAVIGATION;  Surgeon: Judith Part, MD;  Location: [facility];  Service: Neurosurgery;  Laterality: N/A;   BREAST BIOPSY Right    CHOLECYSTECTOMY     CRANIOTOMY Right 05/18/2021   Procedure: Right craniotomy for tumor resection;  Surgeon: Judith Part, MD;  Location: [facility];  Service: Neurosurgery;  Laterality: Right;   EYE SURGERY     as a child   HERNIA REPAIR      Assessment / Plan / Recommendation Clinical Impression  Olivia Shah. 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  History taken from chart review due to cognition.  She was evaluated by Dr. Venetia Constable and admitted on 05/18/2021 due to progressive loss of vision for gross total resection of tumor. Hospital course complicated by status epilepticus requiring addition of multiple meds as well as intubation for airway protection. She failed extubation attempts X 2 due to stridor requiring tracheostomy and was tolerating attempts at wean to ATC. She was limited by lethargy, headaches and recurrent right focal motor seizures. Medications adjusted with resolution of seizures.    She was transferred  to [facility]/04/2021 for management and wean. Her trach found to have been dislodged on 09/11. She was treated with racemic epi and steroids with need for replacement. Respiratory status has been stable and she has been weaned off oxygen. Solumedrol weaned to prednisone and stops today. Blood pressures have been labile in the 5 days requiring up-tritration/addition of meds for better control. MBS done and she is tolerating D1, thins with aspiration precautions. Patient is cortically blind with cognitive deficits and dysphagia.  Therapy has been ongoing and is working on pregait activity. CIR was recommended due to functional decline.  Please see preadmission assessment from earlier today as well.  Evaluation was limited to lethargy and reduced participation. Pt presents with severe cognitive linguistic deficits, impairments include reduced focused/sustained attention, limited verbal output, response to yes/no question, following 1 step commands/basic problem solving, orientation to time, and intellectual awareness. Pt was able to read name x1, therefore SLP will continue to assess vision and reading skills. Pt demonstrated sustained attention in less than 1-minute intervals during direct tasks, however during PO consumption of lunch tray pt demonstrated sustained attention up to 10 minute intervals with mod A verbal cues. Pt was able to answer simple yes/no questions via head nods with relative accuracy but did not always respond to questions. Pt was able to follow 20% of 1 step commands presented to her with maximal assistance. Pt was orientated to person, general place, situation and month only. Pt demonstrated limited verbalizations at the word level with low vocal intensity.  Oral motor examination was limited due to reduced ability to follow commands, question  possible apraxia. Pt was able to consume dys 1 textures and thin liquids with appropriate mastication, oral clearance, swallow initiation and only s/s  overt aspiration (immediate cough) noted was at the end of the meal likely impacted by fatigue and possible pharyngeal residue noted on 9/15 MBS. Pt required extended bolus preparation time to consume dys 2 and dys 3 textures. SLP recommends continuing diet of dys 1 textures and thin liquids, medication crushed in puree with full supervision (total A feeding) to ensure pt is alert, sitting near 90 degrees, and secondary swallow every 2-3 bites. Pt's best friend Olivia Shah) and a sister Olivia Shah) were presented and signed off to provide supervision of meals on current diet. Pt would benefit from skilled ST services in order to maximize functional independence and reduce burden of care, requiring 24 supervision at discharge with continued skilled ST services.    Skilled Therapeutic Interventions          Skilled ST services focused on education, swallow and cognitive skills. SLP facilitated administration of informal cognitive linguistic assessment and BSE, providing education of results. SLP and family/friend collaborated to set goals during length of stay. Victors returned demonstration of total A feeding and verbal cues for swallow strategies. All questions answered to satisfaction. Pt was left in room with visitors call bell within reach and bed alarm set. SLP recommends to continue skilled services.  SLP Assessment  Patient will need skilled Speech Lanaguage Pathology Services during CIR admission    Recommendations  SLP Diet Recommendations: Thin;Dysphagia 1 (Puree) Liquid Administration via: Cup;Straw Medication Administration: Crushed with puree Supervision: Staff to assist with self feeding Compensations: Small sips/bites;Multiple dry swallows after each bite/sip Postural Changes and/or Swallow Maneuvers: Seated upright 90 degrees Oral Care Recommendations: Oral care QID Patient destination: Home Follow up Recommendations: Home Health SLP;24 hour supervision/assistance Equipment Recommended: None  recommended by SLP    SLP Frequency 3 to 5 out of 7 days   SLP Duration  SLP Intensity  SLP Treatment/Interventions 3 weeks  Minumum of 1-2 x/day, 30 to 90 minutes  Cognitive remediation/compensation;Cueing hierarchy;Functional tasks;Patient/family education;Dysphagia/aspiration precaution training;Internal/external aids;Speech/Language facilitation    Pain Pain Assessment Pain Score: 0-No pain  Prior Functioning Cognitive/Linguistic Baseline: Information not available Type of Home: House Available Help at Discharge: Family;Available 24 hours/day  SLP Evaluation Cognition Overall Cognitive Status: Impaired/Different from baseline Arousal/Alertness: Lethargic Orientation Level: Oriented to person;Oriented to place;Oriented to situation Attention: Focused;Sustained Focused Attention: Impaired Sustained Attention: Impaired Memory: Impaired Memory Impairment: Decreased recall of new information;Decreased short term memory Awareness: Impaired Awareness Impairment: Intellectual impairment Problem Solving: Impaired Safety/Judgment: Impaired  Comprehension Auditory Comprehension Overall Auditory Comprehension: Impaired Yes/No Questions: Impaired Basic Biographical Questions: 76-100% accurate Basic Immediate Environment Questions: 75-100% accurate Commands: Impaired One Step Basic Commands: 0-24% accurate Conversation: Simple Interfering Components: Visual impairments;Attention Visual Recognition/Discrimination Discrimination: Not tested Reading Comprehension Reading Status: Not tested Expression Expression Primary Mode of Expression: Verbal Verbal Expression Overall Verbal Expression: Impaired Initiation: Impaired Level of Generative/Spontaneous Verbalization: Word Repetition: Impaired Level of Impairment: Word level Naming: Impairment Confrontation: Impaired Pragmatics: Impairment Impairments: Eye contact;Abnormal affect Interfering Components:  Attention Written Expression Dominant Hand: Right Written Expression: Not tested Oral Motor Oral Motor/Sensory Function Overall Oral Motor/Sensory Function:  (limited assessment, appeared WFL during PO intake) Motor Speech Overall Motor Speech: Impaired Respiration: Within functional limits Phonation: Low vocal intensity;Hoarse Intelligibility: Intelligibility reduced Word: 0-24% accurate Motor Planning:  (TBD)  Care Tool Care Tool Cognition Ability to hear (with hearing aid or hearing appliances if normally used Ability  to hear (with hearing aid or hearing appliances if normally used): 0. Adequate - no difficulty in normal conservation, social interaction, listening to TV   Expression of Ideas and Wants Expression of Ideas and Wants: 2. Frequent difficulty - frequently exhibits difficulty with expressing needs and ideas   Understanding Verbal and Non-Verbal Content Understanding Verbal and Non-Verbal Content: 2. Sometimes understands - understands only basic conversations or simple, direct phrases. Frequently requires cues to understand  Memory/Recall Ability Memory/Recall Ability : None of the above were recalled   PMSV Assessment  PMSV Trial Intelligibility: Intelligibility reduced Word: 0-24% accurate  Bedside Swallowing Assessment General Date of Onset: 05/18/21 Previous Swallow Assessment: 9/15 MBS results dys 1 textures and thin liquids, no aspiration Diet Prior to this Study: Dysphagia 1 (puree);Thin liquids Respiratory Status: Room air History of Recent Intubation: Yes Length of Intubations (days): 8 days Behavior/Cognition: Lethargic/Drowsy Oral Cavity - Dentition: Adequate natural dentition Self-Feeding Abilities: Total assist;Needs assist Vision: Impaired for self-feeding Patient Positioning: Upright in bed Baseline Vocal Quality: Low vocal intensity Volitional Cough: Cognitively unable to elicit Volitional Swallow: Unable to elicit  Oral Care Assessment Does  patient have any of the following "high(er) risk" factors?: None of the above Does patient have any of the following "at risk" factors?: Other - dysphagia Patient is HIGH RISK: Non-ventilated: Order set for Adult Oral Care Protocol initiated - "High Risk Patients - Non-Ventilated" option selected  (see row information) Patient is AT RISK: Order set for Adult Oral Care Protocol initiated -  "At Risk Patients" option selected (see row information) Patient is LOW RISK: Follow universal precautions (see row information) Ice Chips Ice chips: Not tested Thin Liquid Thin Liquid: Not tested Presentation: Cup;Straw Pharyngeal  Phase Impairments: Cough - Immediate Other Comments: only at the end of the meal Nectar Thick Nectar Thick Liquid: Not tested Honey Thick Honey Thick Liquid: Not tested Puree Puree: Not tested Solid Solid: Impaired Oral Phase Functional Implications: Prolonged oral transit;Impaired mastication;Oral holding Pharyngeal Phase Impairments: Cough - Immediate Other Comments: only at the end of the meal BSE Assessment Risk for Aspiration Impact on safety and function: Mild aspiration risk Other Related Risk Factors: Cognitive impairment;Deconditioning;Prolonged intubation  Short Term Goals: Week 1: SLP Short Term Goal 1 (Week 1): Pt will follow 1 step commands with 30% accuracy with max A multimodal cues. SLP Short Term Goal 2 (Week 1): Pt will demonstrate focused/sustained attention in 1-2 minute intervals with max A multimodal cues in 60% of opportunities. SLP Short Term Goal 3 (Week 1): Pt will respond to simple yes/no question verbally or nonverbally with 80% accuracy in 60% of opportunities. SLP Short Term Goal 4 (Week 1): Pt will increase verbal output, speaking at word level in 30% of opportunities with max A multimodal cues. SLP Short Term Goal 5 (Week 1): Pt will consume current diet dys 1 textures and thin liquids with minimal overt s/s aspiration and use of swallow  strategies with max A verbal cues. SLP Short Term Goal 6 (Week 1): Pt will participate in continued assessment of cognitive linguistic and visual skills.  Refer to Care Plan for Long Term Goals  Recommendations for other services: None   Discharge Criteria: Patient will be discharged from SLP if patient refuses treatment 3 consecutive times without medical reason, if treatment goals not met, if there is a change in medical status, if patient makes no progress towards goals or if patient is discharged from hospital.  The above assessment, treatment plan, treatment alternatives and goals were  discussed and mutually agreed upon: by patient and by family  [place]  [facility] 06/17/2021, 5:39 PM

## 2021-06-17 NOTE — Progress Notes (Signed)
PROGRESS NOTE   Subjective/Complaints: Olivia Shah has no complaints this morning. Discussed her labs with her- recommended increasing protein in diet and limiting added sugars.  No issues reported overnight.   ROS: denies pain   Objective:   No results found. Recent Labs    06/16/21 0439 06/17/21 0504  WBC 10.7* 10.7*  HGB 12.8 12.7  HCT 39.8 40.3  PLT 390 336   Recent Labs    06/16/21 0439 06/17/21 0504  NA 135 138  K 3.4* 4.1  CL 99 101  CO2 24 29  GLUCOSE 204* 165*  BUN 33* 24*  CREATININE 0.73 0.89  CALCIUM 8.9 9.0    Intake/Output Summary (Last 24 hours) at 06/17/2021 1037 Last data filed at 06/17/2021 0837 Gross per 24 hour  Intake 240 ml  Output --  Net 240 ml        Physical Exam: Vital Signs Blood pressure 112/69, pulse 70, temperature 98.9 F (37.2 C), temperature source Oral, resp. rate 18, height 5\' 2"  (1.575 m), weight 79.2 kg, SpO2 100 %. Gen: no distress, normal appearing HEENT: oral mucosa pink and moist, NCAT Cardio: Reg rate Chest: normal effort, normal rate of breathing Abd: soft, non-distended Ext: no edema Psych: pleasant, normal affect Skin: intact Neurological:     Mental Status: She is alert. She is disoriented.     Comments: Alert and oriented x1 Motor: Limited due to cognition, however left side appears weaker than right  Unable to see 2 fingers in front of her face and could not see me on the left. Left inattention with delayed output.  She was able to follow occasional one step motor commands with tactile cues.    Psychiatric:        Mood and Affect: Affect is blunt.        Speech: Speech is delayed.        Behavior: Behavior is slowed.        Cognition and Memory: Cognition is impaired.    Assessment/Plan: 1. Functional deficits which require 3+ hours per day of interdisciplinary therapy in a comprehensive inpatient rehab setting. Physiatrist is providing  close team supervision and 24 hour management of active medical problems listed below. Physiatrist and rehab team continue to assess barriers to discharge/monitor patient progress toward functional and medical goals  Care Tool:  Bathing              Bathing assist       Upper Body Dressing/Undressing Upper body dressing        Upper body assist Assist Level: Total Assistance - Patient < 25%    Lower Body Dressing/Undressing Lower body dressing            Lower body assist Assist for lower body dressing: 2 Helpers     Toileting Toileting    Toileting assist Assist for toileting: 2 Helpers     Transfers Chair/bed transfer  Transfers assist           Locomotion Ambulation   Ambulation assist              Walk 10 feet activity   Assist  Walk 50 feet activity   Assist           Walk 150 feet activity   Assist           Walk 10 feet on uneven surface  activity   Assist           Wheelchair     Assist               Wheelchair 50 feet with 2 turns activity    Assist            Wheelchair 150 feet activity     Assist          Blood pressure 112/69, pulse 70, temperature 98.9 F (37.2 C), temperature source Oral, resp. rate 18, height 5\' 2"  (1.575 m), weight 79.2 kg, SpO2 100 %.  Medical Problem List and Plan: 1.  Deficits with mobility, swallowing, cognition secondary to seizures status post meningioma resection.             -patient may not shower             -ELOS/Goals: 14-17 days/Supervision/Min A            Initial CIR evaluations today.  2.  Impaired mobility: Continue Lovenox             -antiplatelet therapy: N/A 3. Pain Management: PRN meds 4. Mood: LCSW to follow for evaluation and support.              -antipsychotic agents: N/A 5. Neuropsych: This patient is not capable of making decisions on her own behalf. 6. Skin/Wound Care: Routine pressure relief measures.   7. Fluids/Electrolytes/Nutrition: Monitor I/Os 8. HTN: Monitor BP TID--continue Losartan, amlodipine, hydralazine.             Monitor with increased mobility 9.T2DM: Hgb A1C-6.6 and well controlled on home regimen 1000 mg am/500 mg pm             --Will discontinue Lantus after pm dose. Resume metformin as will off steroids.              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  11.  Hypokalemia: normalized, monitor weekly.     LOS: 1 days A FACE TO FACE EVALUATION WAS PERFORMED  Olivia Shah 06/17/2021, 10:37 AM

## 2021-06-17 NOTE — Progress Notes (Signed)
Physical Therapy Session Note  Patient Details  Name: Olivia Shah MRN: [id number] Date of Birth: 1955/12/15  Today's Date: 06/17/2021 PT Individual Time: 5374-8270 PT Individual Time Calculation (min): 30 min   Short Term Goals: Week 1:  PT Short Term Goal 1 (Week 1): Pt will complete bed mobility with maxA of 1 person PT Short Term Goal 2 (Week 1): Pt will complete sit<>stand transfer with maxA of 1 person PT Short Term Goal 3 (Week 1): Pt will complete bed<>chair transfers with maxA and LRAD PT Short Term Goal 4 (Week 1): Pt will demonstrate improved alertness and participation to actively engage in therapy sessions  Skilled Therapeutic Interventions/Progress Updates:      Pt supine in bed at start of session. Family at bedside who wasn't present for this morning's PT evaluation. Sister and close family friend provided PLOF and social factors. Pt lives alone in a 1 lvl home with 3 STE with 1 rail on R. She was indep with no AD, driving, working as a Network engineer at [employer]. 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 Pt is more awake this session compared to before but is still quite lethargic. Donned disposable pants and shoes at bed level with totalA. She required totalA +2 for rolling in bed with delayed to absent initiation and processing. Supine<>sit completed with totalA of 1 person with use of bed features. Requires minA for sitting EOB due to poor postural control with flexed cervical neck and thoracic spine. She completed x2 sit<>stands from both raised EOB and lowered EOB, requiring totalA for both with bilateral knee blocks. She was unable to fully clear hips from bed and struggled with initiating posterior chain to achieve extension. She was assisted back to supine with totalA and required +2 totalA for scooting up in the bed. She remained semi-reclined in bed with all needs in reach and bed alarm on, family at bedside.   Therapy  Documentation Precautions:  Precautions Precautions: Fall Precaution Comments: Cortical blindness, seizure preacutions, high fall risk Restrictions Weight Bearing Restrictions: No General:     Therapy/Group: Individual Therapy  [name] 06/17/2021, 3:30 PM

## 2021-06-18 DIAGNOSIS — R5381 Other malaise: Secondary | ICD-10-CM | POA: Diagnosis not present

## 2021-06-18 LAB — GLUCOSE, CAPILLARY
Glucose-Capillary: 160 mg/dL — ABNORMAL HIGH (ref 70–99)
Glucose-Capillary: 269 mg/dL — ABNORMAL HIGH (ref 70–99)

## 2021-06-18 MED ORDER — VITAMIN E 6.75 MG/0.3ML PO SOLN
400.0000 [IU] | Freq: Every day | ORAL | Status: DC
  Administered 2021-06-18 – 2021-07-08 (×21): 400 [IU] via ORAL
  Filled 2021-06-18 (×23): qty 8

## 2021-06-18 NOTE — Progress Notes (Signed)
Occupational Therapy Session Note  Patient Details  Name: Olivia Shah MRN: [id number] Date of Birth: [birth year]  Today's Date: 06/18/2021 OT Individual Time: 1300-1356 OT Individual Time Calculation (min): 56 min    Short Term Goals: Week 1:  OT Short Term Goal 1 (Week 1): Patient will don UB clothing with Min A seated EOB. OT Short Term Goal 2 (Week 1): Patient will complet e1/3 parts of LB clothing at bed level with Mod A. OT Short Term Goal 3 (Week 1): Patient will complete sit to stand transfer with Max A and LRAD.  Skilled Therapeutic Interventions/Progress Updates:  Patient met lying supine in bed asleep. With increased alertness, patient in agreement with OT treatment session. Nephew present at bedside. 0/10 on [employer] at rest and with activity. Meal tray present at bedside untouched. Supine to EOB transfer with assist at BLE and trunk. HOB elevated. Seated EOB, patient required max encouragement to consume several bites. Assist to grasp utensils this date. Initially required assist to bring spoon from bowl to mouth but later able to hold ice cream in L hand and spoon in R hand to take several bites. Patient consumed less than 5% of meal. Took a few sips from canned soft drink but unable to locate can on tray without external assist and max multimodal cues. UB/LB dressing at bed level with patient requiring Max A to don zip up sweater and Total A to don brief/pants in supine. Total A to comb patients hair and pull into bun. Patient able to answer yes/no questions with good accuracy and increased time to respond. Session concluded with patient lying supine in bed with call bell within reach, bed alarm activated and all needs met.   Therapy Documentation Precautions:  Precautions Precautions: Fall Precaution Comments: Cortical blindness, seizure preacutions, high fall risk Restrictions Weight Bearing Restrictions: No General:     Therapy/Group: Individual Therapy  Lynisha Osuch  R [place] 06/18/2021, 7:31 AM

## 2021-06-18 NOTE — Progress Notes (Addendum)
PROGRESS NOTE   Subjective/Complaints: Olivia Shah expressed no complaints this morning Darrick Meigs noted her to be less lethargic today Working in the parallel bars in the gym Updated her niece yesterday  ROS: denies pain, +visual deficits   Objective:   No results found. Recent Labs    06/16/21 0439 06/17/21 0504  WBC 10.7* 10.7*  HGB 12.8 12.7  HCT 39.8 40.3  PLT 390 336   Recent Labs    06/16/21 0439 06/17/21 0504  NA 135 138  K 3.4* 4.1  CL 99 101  CO2 24 29  GLUCOSE 204* 165*  BUN 33* 24*  CREATININE 0.73 0.89  CALCIUM 8.9 9.0    Intake/Output Summary (Last 24 hours) at 06/18/2021 1150 Last data filed at 06/17/2021 1848 Gross per 24 hour  Intake 480 ml  Output --  Net 480 ml        Physical Exam: Vital Signs Blood pressure 108/75, pulse 87, temperature 98.6 F (37 C), temperature source Oral, resp. rate 16, height 5\' 2"  (1.575 m), weight 79.2 kg, SpO2 100 %. Gen: no distress, normal appearing HEENT: oral mucosa pink and moist, NCAT Cardio: Reg rate Chest: normal effort, normal rate of breathing Abd: soft, non-distended Ext: no edema Psych: pleasant, normal affect Skin: intact Neurological:     Mental Status: She is alert. She is disoriented.     Comments: Alert and oriented x1 Motor: Limited due to cognition, however left side appears weaker than right  Unable to see 2 fingers in front of her face and could not see me on the left. Left inattention with delayed output.  She was able to follow occasional one step motor commands with tactile cues.    Psychiatric:        Mood and Affect: Affect is blunt.        Speech: Speech is delayed.        Behavior: Behavior is slowed.        Cognition and Memory: Cognition is impaired.    Assessment/Plan: 1. Functional deficits which require 3+ hours per day of interdisciplinary therapy in a comprehensive inpatient rehab setting. Physiatrist is  providing close team supervision and 24 hour management of active medical problems listed below. Physiatrist and rehab team continue to assess barriers to discharge/monitor patient progress toward functional and medical goals  Care Tool:  Bathing    Body parts bathed by patient: Right arm, Left arm, Chest, Abdomen, Face   Body parts bathed by helper: Front perineal area, Buttocks, Right lower leg, Left lower leg, Left upper leg, Right upper leg     Bathing assist Assist Level: Maximal Assistance - Patient 24 - 49%     Upper Body Dressing/Undressing Upper body dressing   What is the patient wearing?: Pull over shirt    Upper body assist Assist Level: Minimal Assistance - Patient > 75%    Lower Body Dressing/Undressing Lower body dressing      What is the patient wearing?: Incontinence brief, Pants     Lower body assist Assist for lower body dressing: Total Assistance - Patient < 25%     Toileting Toileting    Toileting assist Assist for toileting: Dependent -  Patient 0%     Transfers Chair/bed transfer  Transfers assist  Chair/bed transfer activity did not occur: Safety/medical concerns        Locomotion Ambulation   Ambulation assist   Ambulation activity did not occur: Safety/medical concerns          Walk 10 feet activity   Assist  Walk 10 feet activity did not occur: Safety/medical concerns        Walk 50 feet activity   Assist Walk 50 feet with 2 turns activity did not occur: Safety/medical concerns         Walk 150 feet activity   Assist Walk 150 feet activity did not occur: Safety/medical concerns         Walk 10 feet on uneven surface  activity   Assist Walk 10 feet on uneven surfaces activity did not occur: Safety/medical concerns         Wheelchair     Assist Is the patient using a wheelchair?: No             Wheelchair 50 feet with 2 turns activity    Assist            Wheelchair 150 feet  activity     Assist          Blood pressure 108/75, pulse 87, temperature 98.6 F (37 C), temperature source Oral, resp. rate 16, height 5\' 2"  (1.575 m), weight 79.2 kg, SpO2 100 %.  Medical Problem List and Plan: 1.  Deficits with mobility, swallowing, cognition secondary to seizures status post meningioma resection.             -patient may not shower             -ELOS/Goals: 14-17 days/Supervision/Min A           Continue CIR 2.  Impaired mobility: Continue Lovenox             -antiplatelet therapy: N/A 3. Pain Management: PRN meds 4. Mood: LCSW to follow for evaluation and support.              -antipsychotic agents: N/A 5. Neuropsych: This patient is not capable of making decisions on her own behalf. 6. Skin/Wound Care: Routine pressure relief measures.  7. Fluids/Electrolytes/Nutrition: Monitor I/Os 8. HTN: Monitor BP TID--continue Losartan, amlodipine, hydralazine.             Monitor with increased mobility 9.T2DM: Hgb A1C-6.6 and well controlled on home regimen 1000 mg am/500 mg pm             --Will discontinue Lantus after pm dose. Resume metformin as will off steroids.              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 Contacted [facility] to discuss with niece and to advise [place] when repeat imaging is warranted 11.  Hypokalemia: normalized, monitor weekly.  12. Dysphagia: continue SLP    LOS: 2 days A FACE TO FACE EVALUATION WAS PERFORMED  Martha Clan P Meigan Pates 06/18/2021, 11:50 AM

## 2021-06-18 NOTE — Progress Notes (Signed)
Speech Language Pathology Daily Session Note  Patient Details  Name: Olivia Shah MRN: [id number] Date of Birth: 07-19-1956  Today's Date: 06/18/2021 SLP Individual Time: 1500-1530 SLP Individual Time Calculation (min): 30 min  Short Term Goals: Week 1: SLP Short Term Goal 1 (Week 1): Pt will follow 1 step commands with 30% accuracy with max A multimodal cues. SLP Short Term Goal 2 (Week 1): Pt will demonstrate focused/sustained attention in 1-2 minute intervals with max A multimodal cues in 60% of opportunities. SLP Short Term Goal 3 (Week 1): Pt will respond to simple yes/no question verbally or nonverbally with 80% accuracy in 60% of opportunities. SLP Short Term Goal 4 (Week 1): Pt will increase verbal output, speaking at word level in 30% of opportunities with max A multimodal cues. SLP Short Term Goal 5 (Week 1): Pt will consume current diet dys 1 textures and thin liquids with minimal overt s/s aspiration and use of swallow strategies with max A verbal cues. SLP Short Term Goal 6 (Week 1): Pt will participate in continued assessment of cognitive linguistic and visual skills.  Skilled Therapeutic Interventions:   Patient seen with sister and nephew present in room. She was awake and alert but per sister, she has been more active and responsive than she has been this afternoon. Patient required maxA cues to initiate for two-field object identification via pointing for accuracy of 2/5 attempts. She did vocalize with very low intensity when SLP introducing self, with her saying her name. Patient did not attempt to make eye contact with SLP or sister during session and overall exhibited very little engagement. Sister informed SLP that patient likes oatmeal and grits during discussion of her very poor oral intake. Patient left in bed with family present in room and all needs within reach. She continues to benefit from skilled SLP intervention to maximize cognitive-linguistic and swallow  function goals prior to discharge.   Pain Pain Assessment Pain Scale: Faces Pain Score: 0-No pain Faces Pain Scale: No hurt  Therapy/Group: Individual Therapy  Sonia Baller, MA, CCC-SLP Speech Therapy

## 2021-06-18 NOTE — Progress Notes (Signed)
Physical Therapy Session Note  Patient Details  Name: SUI KASPAREK MRN: [id number] Date of Birth: 01-14-1956  Today's Date: 06/18/2021 PT Individual Time: 0800-0909 + 1130-145 PT Individual Time Calculation (min): 69 min  + 15 min  Short Term Goals: Week 1:  PT Short Term Goal 1 (Week 1): Pt will complete bed mobility with maxA of 1 person PT Short Term Goal 2 (Week 1): Pt will complete sit<>stand transfer with maxA of 1 person PT Short Term Goal 3 (Week 1): Pt will complete bed<>chair transfers with maxA and LRAD PT Short Term Goal 4 (Week 1): Pt will demonstrate improved alertness and participation to actively engage in therapy sessions  Skilled Therapeutic Interventions/Progress Updates:     1st session: Attempted to retrieve a 20x16 w/c but no 20inch widths available. Retrieved sliding board for transfers. Pt supine in bed sleeping - awakens to voice and responds 'yes' to PT tx. She responds 'no' to pain. Somewhat more alert compared to yesterday although she kept her eyes closed for majority of the session. Noted bladder incontinence with saturated brief. Bed level pericare and totalA for brief change. Requires maxA for rolling to her R and totalA for rolling to her L with delayed initiation and processing, benefiting from facilitating initiation to complete rolling. Donned disposable scrub pants and socks at bed level as well in similar manner with multiple bouts of rolling in bed. Attempted to initiate bed level exercises to work on strengthening as well as cognitive processing for command follow, sequencing, motor planning, and initiation. Unfortunately, she would just stare blankly at the ceiling and not assist with moving extremities but would verbalize "okay" when prompted to begin exercises. She completed supine<>sit with maxA with HOB flat - requiring assist for LE management and trunk to upright via log roll technique. Able to sit EOB with CGA and completed lateral lean to her L  with modA for facilitation. Placed sliding board with [place] and then completed sliding board transfer with maxA towards her R side to the w/c, going downhill from raised EOB. Pt transported in w/c to main rehab gym for time. Assisted to mat table via stand<>pivot transfer with +2 maxA (maxA primarily for powering to rise with modA for balance during transfer). She was able to initiate some small steps during the transfer. At edge of mat, worked on 1x5 sit<>stands with maxA overall with +2 assist on standby - with standing, demonstrates strong retropulsion at the trunk and difficulty producing hip extension but capable with max cues and tactile feedback. Stand<>pivot with maxA back to her w/c with +2 on standby and then wheeled into // bars to work on functional gait training. Sit<>stand in // bars with maxA while pulling herself up to stand. She was able to ambulate length of // bars, ~37ft, with maxA and +2 assist for w/c follow. Demonstrates very small shuffling steps with strong posterior lean, requires guiding her LUE along // bar due to inattention. No knee buckling observed. Returned to her room and assisted back to the bed via stand<>pivot transfer with +2 modA as pt demonstrated improved ability to push up from arm chair to assist in standing. Sit>supine with +2 totalA due to lack of initiation and processing. Ended session supine in bed with bed alarm on and all needs in reach.   2nd session: Pt supine in bed sleeping on arrival with nephew at the bedside. Pt very lethargic and had difficulty arousing. Donned tennis shoes at [place] level. Completed supine<>sit with totalA due  to lack of effort and initiation. Demonstrates significant "c-shaped" sitting at EOB with very flexed trunk and cervical spine. She's able to open eyes and lift her head upon cues but then immediately returns to the same posture. Attempted to engage her in tall sitting and seated there-ex but no initiation noted despite +++ time for  processing - provided multi-modal cueing including demonstration and PROM. Deferred transfers or standing due to lethargy and no +2 assist. Returned to supine position with totalA and boosted up in bed with totalA +2 with assist from nephew. She missed 15 minutes of skilled therapy due to inability to functionally participate 2/2 lethargy.   Therapy Documentation Precautions:  Precautions Precautions: Fall Precaution Comments: Cortical blindness, seizure preacutions, high fall risk Restrictions Weight Bearing Restrictions: No General:    Therapy/Group: Individual Therapy  Alger Simons 06/18/2021, 7:27 AM

## 2021-06-19 DIAGNOSIS — R5381 Other malaise: Secondary | ICD-10-CM | POA: Diagnosis not present

## 2021-06-19 LAB — CBC WITH DIFFERENTIAL/PLATELET
Abs Immature Granulocytes: 0.09 10*3/uL — ABNORMAL HIGH (ref 0.00–0.07)
Basophils Absolute: 0 10*3/uL (ref 0.0–0.1)
Basophils Relative: 1 %
Eosinophils Absolute: 0.2 10*3/uL (ref 0.0–0.5)
Eosinophils Relative: 3 %
HCT: 38.4 % (ref 36.0–46.0)
Hemoglobin: 12.5 g/dL (ref 12.0–15.0)
Immature Granulocytes: 1 %
Lymphocytes Relative: 39 %
Lymphs Abs: 3.4 10*3/uL (ref 0.7–4.0)
MCH: 28.9 pg (ref 26.0–34.0)
MCHC: 32.6 g/dL (ref 30.0–36.0)
MCV: 88.9 fL (ref 80.0–100.0)
Monocytes Absolute: 1.1 10*3/uL — ABNORMAL HIGH (ref 0.1–1.0)
Monocytes Relative: 12 %
Neutro Abs: 3.9 10*3/uL (ref 1.7–7.7)
Neutrophils Relative %: 44 %
Platelets: 290 10*3/uL (ref 150–400)
RBC: 4.32 MIL/uL (ref 3.87–5.11)
RDW: 17.2 % — ABNORMAL HIGH (ref 11.5–15.5)
WBC: 8.7 10*3/uL (ref 4.0–10.5)
nRBC: 0 % (ref 0.0–0.2)

## 2021-06-19 LAB — VITAMIN D 25 HYDROXY (VIT D DEFICIENCY, FRACTURES): Vit D, 25-Hydroxy: 71.26 ng/mL (ref 30–100)

## 2021-06-19 LAB — MAGNESIUM: Magnesium: 1.9 mg/dL (ref 1.7–2.4)

## 2021-06-19 MED ORDER — PROSOURCE PLUS PO LIQD
30.0000 mL | Freq: Three times a day (TID) | ORAL | Status: DC
  Administered 2021-06-20 – 2021-07-08 (×21): 30 mL via ORAL
  Filled 2021-06-19 (×32): qty 30

## 2021-06-19 MED ORDER — MEGESTROL ACETATE 400 MG/10ML PO SUSP
400.0000 mg | Freq: Every day | ORAL | Status: DC
  Administered 2021-06-20 – 2021-06-23 (×4): 400 mg via ORAL
  Filled 2021-06-19 (×6): qty 10

## 2021-06-19 MED ORDER — NEPRO/CARBSTEADY PO LIQD
237.0000 mL | Freq: Three times a day (TID) | ORAL | Status: DC
  Administered 2021-06-19 – 2021-06-23 (×6): 237 mL via ORAL

## 2021-06-19 NOTE — Progress Notes (Signed)
Physical Therapy Session Note  Patient Details  Name: Olivia Shah MRN: [id number] Date of Birth: 09-25-1956  Today's Date: 06/19/2021 PT Individual Time: 0900-1000 PT Individual Time Calculation (min): 60 min  Short Term Goals: Week 1:  PT Short Term Goal 1 (Week 1): Pt will complete bed mobility with maxA of 1 person PT Short Term Goal 2 (Week 1): Pt will complete sit<>stand transfer with maxA of 1 person PT Short Term Goal 3 (Week 1): Pt will complete bed<>chair transfers with maxA and LRAD PT Short Term Goal 4 (Week 1): Pt will demonstrate improved alertness and participation to actively engage in therapy sessions  Skilled Therapeutic Interventions/Progress Updates:     1st session: Pt sitting in w/c at start of session with sister at bedside. Updated sister on pt's mobility with PT and barriers to progressions with lethargy and poor attention. Pt was able to read the clock accurately that was hanging on the wall. She denies any pain. Transported to main rehab gym for time. Worked on gait training in // bars with maxA and +2 assist for w/c follow. Ambulated length of // bars (~41ft) twice with seated rest break provided. Gait deficits are very short shuffling steps, STRONG posterior bias, and decreased L attention with guidance needed for advancing her LUE along // bar. She didn't demonstrate any knee buckling but maxA needed for balance with max verbal cues for stepping technique. Required facilitation at the hips for forward/backward translation of the pelvis to initiate steps. Worked on sit<>stands from mat table with maxA and no AD, lacks forward weight shift and initiation for technique. Attempted to work on card matching in standing but unable to dual-task so resorted to sitting. In sitting, she required max verbal cues and hand-over-hand assist for RUE or LUE initiation and reaching for card matching. She was internally > externally distracted by her t-shirt tag and design of the  shirt, and was somewhat perseverate of this. Returned to her room and assisted back to her bed with maxA stand<>pivot transfer without an AD, use of face-to-face technique and assist for lateral weight shifting to step towards EOB. Required totalA for sit>Supine due to lack of initiation and effort. Concluded session in bed with bed alarm on and all needs in reach.   Therapy Documentation Precautions:  Precautions Precautions: Fall Precaution Comments: Cortical blindness, seizure preacutions, high fall risk Restrictions Weight Bearing Restrictions: No General:    Therapy/Group: Individual Therapy  Twilia Yaklin P Shamekia Tippets PT 06/19/2021, 7:33 AM

## 2021-06-19 NOTE — IPOC Note (Signed)
Overall Plan of Care [facility]) Patient Details Name: Olivia Shah MRN: [id number] DOB: December 13, 1955  Admitting Diagnosis: [facility] Problems: Principal Problem:   Debility     Functional Problem List: Nursing Bladder, Bowel, Medication Management, Safety, Nutrition, Pain, Endurance, Skin Integrity  PT Balance, Behavior, Edema, Endurance, Motor, Nutrition, Perception, Safety, Sensory, Skin Integrity  OT Balance, Cognition, Endurance, Motor, Safety, Sensory, Vision  SLP Cognition  TR         Basic ADL's: OT Eating, Grooming, Bathing, Dressing, Toileting     Advanced  ADL's: OT       Transfers: PT Bed Mobility, Bed to Chair, Teacher, early years/pre, Tub/Shower     Locomotion: PT Ambulation, Stairs, Wheelchair Mobility     Additional Impairments: OT    SLP Swallowing, Communication, Social Cognition expression, comprehension Awareness, Attention, Problem Solving  TR      Anticipated Outcomes Item Anticipated Outcome  Self Feeding Independent  Swallowing  Supervision A   Basic self-care  Supervision to [employer] A  Toileting  Min A   Bathroom Transfers Min A  Bowel/Bladder  Manage bowel w mod I and bladder with mod I assist  Transfers  minA with LRAD  Locomotion  minA with LRAD  Communication  Min A  Cognition  Min A  Pain  at or below level 4  Safety/Judgment  maintain w cues   Therapy Plan: PT Intensity: Minimum of 1-2 x/day ,45 to 90 minutes PT Frequency: 5 out of 7 days PT Duration Estimated Length of Stay: 3 weeks OT Intensity: Minimum of 1-2 x/day, 45 to 90 minutes OT Frequency: 5 out of 7 days OT Duration/Estimated Length of Stay: 3 weeks SLP Intensity: Minumum of 1-2 x/day, 30 to 90 minutes SLP Frequency: 3 to 5 out of 7 days SLP Duration/Estimated Length of Stay: 3 weeks   Due to the current state of emergency, patients may not be receiving their 3-hours of Medicare-mandated therapy.   Team Interventions: Nursing Interventions Bladder  Management, Disease Management/Prevention, Medication Management, Discharge Planning, Pain Management, Bowel Management, Patient/Family Education, Skin Care/Wound Management, Dysphagia/Aspiration Precaution Training  PT interventions Ambulation/gait training, Cognitive remediation/compensation, Discharge planning, DME/adaptive equipment instruction, Functional mobility training, Pain management, Psychosocial support, Splinting/orthotics, Therapeutic Activities, UE/LE Strength taining/ROM, Visual/perceptual remediation/compensation, Wheelchair propulsion/positioning, UE/LE Coordination activities, Stair training, Skin care/wound management, Therapeutic Exercise, Patient/family education, Neuromuscular re-education, Functional electrical stimulation, Disease management/prevention, Academic librarian, Training and development officer  OT Interventions Training and development officer, Cognitive remediation/compensation, Academic librarian, Discharge planning, Engineer, drilling, Functional mobility training, Neuromuscular re-education, Patient/family education, Psychosocial support, Self Care/advanced ADL retraining, Therapeutic Activities, Therapeutic Exercise, UE/LE Strength taining/ROM, UE/LE Coordination activities, Visual/perceptual remediation/compensation, Wheelchair propulsion/positioning  SLP Interventions Cognitive remediation/compensation, English as a second language teacher, Functional tasks, Patient/family education, Dysphagia/aspiration precaution training, Internal/external aids, Speech/Language facilitation  TR Interventions    SW/CM Interventions Discharge Planning, Psychosocial Support, Patient/Family Education   Barriers to Discharge MD  Medical stability  Nursing Decreased caregiver support, Incontinence, Nutrition means, Wound Care home w sister, 1 level 3 ste right rail  PT Decreased caregiver support, Home environment access/layout, Incontinence, Insurance for SNF coverage, Weight, Lack  of/limited family support, Other (comments) Lethargy  OT Incontinence    SLP      SW Other (comments) blind   Team Discharge Planning: Destination: PT-Home ,OT-  (TBD) , SLP-Home Projected Follow-up: PT-Home health PT, 24 hour supervision/assistance, OT-  Home health OT, 24 hour supervision/assistance, SLP-Home Health SLP, 24 hour supervision/assistance Projected Equipment Needs: PT-To be determined, OT- 3 in 1 bedside comode, To be  determined, SLP-None recommended by SLP Equipment Details: PT- , OT-  Patient/family involved in discharge planning: PT- Patient,  OT-Patient unable/family or caregiver not available, SLP-Patient, Family member/caregiver  MD ELOS: 14-17 days Medical Rehab Prognosis:  Excellent Assessment: Olivia Shah is a 65 year old woman admitted to CIR with deficits with mobility, swallowing, cognition secondary to seizures status post meningioma resection. Course complicated by cortical blindness. Medications are being managed, and labs and vitals are being monitored regularly.      See Team Conference Notes for weekly updates to the plan of care

## 2021-06-19 NOTE — Progress Notes (Signed)
Physical Therapy Session Note  Patient Details  Name: Olivia Shah MRN: [id number] Date of Birth: October 28, 1955  Today's Date: 06/19/2021 PT Co-Treatment Time: 1330-1430 PT Co-Treatment Time Calculation (min): 60 min  Short Term Goals: Week 1:  PT Short Term Goal 1 (Week 1): Pt will complete bed mobility with maxA of 1 person PT Short Term Goal 2 (Week 1): Pt will complete sit<>stand transfer with maxA of 1 person PT Short Term Goal 3 (Week 1): Pt will complete bed<>chair transfers with maxA and LRAD PT Short Term Goal 4 (Week 1): Pt will demonstrate improved alertness and participation to actively engage in therapy sessions  Skilled Therapeutic Interventions/Progress Updates:      Pt seen for co-tx with OT. PT focused on functional bed mobility, transfers, and postural control. PT billed for 30 of the 60 minutes. Pt sleeping on arrival in bed, with sister at bedside. Sister reports lethargy has been ongoing since the morning. Pt arouses to voice and has some difficulty keeping eyes open due to fatigue. Noted cervical neck flexion in supine position with HOB slightly elevated, poor ability to self correct and sustain correction when placed in neutral position - especially in anti-gravity positions such as short sitting EOB or standing. She completed supine<>sit with +2 totalA - no initiation noted during bed mobiltiy. Upon short sitting EOB, demo's "c-shaped" sitting with significant forward flexed trunk and cervical spine. Spent time working on postural control, head lifts, shoulder expansion, and turning her head to her L due to R gaze preference. At [facility], she can sit with supervision for balance. Completes stand<>pivot transfer with +2 maxA with max multi cues for stepping and facilitation for manual lateral weight shift to achieve off load to step to pivot. Poor eccentric control during stand to sit to the w/c. In main rehab gym, completed stand<>pivot transfer in similar manner, standing  posture is significantly flexed and very difficulty to achieve full upright. At edge of mat, provided mirror for visual aid but did not assist much in facilitating postural corrections - continued to shoe R gaze preference, flexed cervical spine, rounded shoulders. Provided foot stool to assist with positioning due to short stature. Attempted to engage her in therapeutic activity such as reaching to targets, grasping 1lb dowel rod, and visual scanning - motor apraxia limiting on ?R side both difficult to assess due to lack of initiation. Assessed performance with stedy transfers to determine transfer method for nursing - she completed stedy transfer with +2 mod/maxA with hand-over-hand assist for facilitating grasp, foot placement. She was able to sit in the perched position with CGA for balance and had difficulty keeping grasp with BUE's to bar. She was transferred to EOB in stedy and then assisted to bed with +2 totalA. She rolled in bed with +2 totalA and placed in L sidelying for skin integrity and pressure relief. Recommend maximove for nursing for transfers. Ended session in bed with family at bedside, all needs within reach.   Therapy Documentation Precautions:  Precautions Precautions: Fall Precaution Comments: Cortical blindness, seizure preacutions, high fall risk Restrictions Weight Bearing Restrictions: No General:    Therapy/Group: Co-Treatment  Alazae Crymes P Ember Henrikson 06/19/2021, 2:55 PM

## 2021-06-19 NOTE — Progress Notes (Signed)
Speech Language Pathology Daily Session Note  Patient Details  Name: Olivia Shah MRN: [id number] Date of Birth: [birth year]  Today's Date: 06/19/2021 SLP Individual Time: 7681-1572 SLP Individual Time Calculation (min): 45 min  Short Term Goals: Week 1: SLP Short Term Goal 1 (Week 1): Pt will follow 1 step commands with 30% accuracy with max A multimodal cues. SLP Short Term Goal 2 (Week 1): Pt will demonstrate focused/sustained attention in 1-2 minute intervals with max A multimodal cues in 60% of opportunities. SLP Short Term Goal 3 (Week 1): Pt will respond to simple yes/no question verbally or nonverbally with 80% accuracy in 60% of opportunities. SLP Short Term Goal 4 (Week 1): Pt will increase verbal output, speaking at word level in 30% of opportunities with max A multimodal cues. SLP Short Term Goal 5 (Week 1): Pt will consume current diet dys 1 textures and thin liquids with minimal overt s/s aspiration and use of swallow strategies with max A verbal cues. SLP Short Term Goal 6 (Week 1): Pt will participate in continued assessment of cognitive linguistic and visual skills.  Skilled Therapeutic Interventions:   Patient seen for skilled ST session with sister and nursing present in room. Patient was awake, alert in bed and nurse was administering her medications (crushed in puree). Patient exhibited significantly prolonged oral transit and swallow initiation with each bite, resulting in approximately 30 minute duration of time until all her morning mediations were completed. Her sister verbally cued patient to open mouth to check for oral holding and would give patient sips of water via straw in between bites of medications crushed in applesauce. Patient verbalized only one time which was when asked if there was anything she needed and she said "for her to leave". (She appeared to be referring to her sister but she did not turn her eye gaze to sister or point. She continues to not  initiate verbal or nonverbal responses or actions unless heavily cued. SLP spent some time discussing with patient's sister about her poor oral intake and prolonged medication administration. She did say that when patient was in [facility] prior to being here on CIR, she "would have good days and bad days" and that she would eat fairly well at times. SLP secure messaged MD regarding poor oral intake and patient has been started on appetite stimulant which will hopefully benefit. Patient left in bed with sister in room and all needs in reach. She continues to benefit from skilled SLP intervention to maximize swallow, communication, cognition function prior to discharge.  Pain Pain Assessment Pain Scale: Faces Faces Pain Scale: No hurt  Therapy/Group: Individual Therapy  Sonia Baller, MA, CCC-SLP Speech Therapy

## 2021-06-19 NOTE — Progress Notes (Addendum)
PROGRESS NOTE   Subjective/Complaints: Mrs. Mccrumb expresses no complaints this morning Her 2 sisters are present and her niece calls in- they have no new concerns. Discussed that we contact NSGY and requested discussion of vision prognosis with family  ROS: denies pain, +visual deficits   Objective:   No results found. Recent Labs    06/17/21 0504  WBC 10.7*  HGB 12.7  HCT 40.3  PLT 336   Recent Labs    06/17/21 0504  NA 138  K 4.1  CL 101  CO2 29  GLUCOSE 165*  BUN 24*  CREATININE 0.89  CALCIUM 9.0    Intake/Output Summary (Last 24 hours) at 06/19/2021 0956 Last data filed at 06/19/2021 0700 Gross per 24 hour  Intake 240 ml  Output --  Net 240 ml        Physical Exam: Vital Signs Blood pressure 119/80, pulse 74, temperature 98.7 F (37.1 C), temperature source Oral, resp. rate 16, height 5\' 2"  (1.575 m), weight 79.2 kg, SpO2 100 %. Gen: no distress, normal appearing HEENT: +cortical blindness Cardio: Reg rate Chest: normal effort, normal rate of breathing Abd: soft, non-distended Ext: no edema Psych: pleasant, normal affect Skin: intact Neurological:     Mental Status: She is alert. She is disoriented.     Comments: Alert and oriented x1 Motor: Limited due to cognition, however left side appears weaker than right  Unable to see 2 fingers in front of her face and could not see me on the left. Left inattention with delayed output.  She was able to follow occasional one step motor commands with tactile cues.    Psychiatric:        Mood and Affect: Affect is blunt.        Speech: Speech is delayed.        Behavior: Behavior is slowed.        Cognition and Memory: Cognition is impaired.    Assessment/Plan: 1. Functional deficits which require 3+ hours per day of interdisciplinary therapy in a comprehensive inpatient rehab setting. Physiatrist is providing close team supervision and 24 hour  management of active medical problems listed below. Physiatrist and rehab team continue to assess barriers to discharge/monitor patient progress toward functional and medical goals  Care Tool:  Bathing    Body parts bathed by patient: Right arm, Left arm, Chest, Abdomen, Face   Body parts bathed by helper: Front perineal area, Buttocks, Right lower leg, Left lower leg, Left upper leg, Right upper leg     Bathing assist Assist Level: Maximal Assistance - Patient 24 - 49%     Upper Body Dressing/Undressing Upper body dressing   What is the patient wearing?: Pull over shirt    Upper body assist Assist Level: Minimal Assistance - Patient > 75%    Lower Body Dressing/Undressing Lower body dressing      What is the patient wearing?: Incontinence brief, Pants     Lower body assist Assist for lower body dressing: Total Assistance - Patient < 25%     Toileting Toileting    Toileting assist Assist for toileting: Dependent - Patient 0%     Transfers Chair/bed transfer  Transfers  assist  Chair/bed transfer activity did not occur: Safety/medical concerns        Locomotion Ambulation   Ambulation assist   Ambulation activity did not occur: Safety/medical concerns          Walk 10 feet activity   Assist  Walk 10 feet activity did not occur: Safety/medical concerns        Walk 50 feet activity   Assist Walk 50 feet with 2 turns activity did not occur: Safety/medical concerns         Walk 150 feet activity   Assist Walk 150 feet activity did not occur: Safety/medical concerns         Walk 10 feet on uneven surface  activity   Assist Walk 10 feet on uneven surfaces activity did not occur: Safety/medical concerns         Wheelchair     Assist Is the patient using a wheelchair?: No             Wheelchair 50 feet with 2 turns activity    Assist            Wheelchair 150 feet activity     Assist          Blood  pressure 119/80, pulse 74, temperature 98.7 F (37.1 C), temperature source Oral, resp. rate 16, height 5\' 2"  (1.575 m), weight 79.2 kg, SpO2 100 %.  Medical Problem List and Plan: 1.  Deficits with mobility, swallowing, cognition secondary to seizures status post meningioma resection.             -patient may not shower             -ELOS/Goals: 14-17 days/Supervision/Min A           Continue CIR  Check vitamin D and magnesium levels.  2.  Impaired mobility: Continue Lovenox             -antiplatelet therapy: N/A 3. Pain Management: PRN meds 4. Mood: LCSW to follow for evaluation and support.              -antipsychotic agents: N/A 5. Neuropsych: This patient is not capable of making decisions on her own behalf. 6. Skin/Wound Care: Routine pressure relief measures.  7. Fluids/Electrolytes/Nutrition: Monitor I/Os 8. HTN: Monitor BP TID--continue Losartan, amlodipine, hydralazine.             Monitor with increased mobility 9.T2DM: Hgb A1C-6.6 and well controlled on home regimen 1000 mg am/500 mg pm             --Will discontinue Lantus after pm dose. Resume metformin as will off steroids.              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 Contacted NSGY to discuss with niece and to advise [place] when repeat imaging is warranted- discussed this with family 47.  Hypokalemia: normalized, monitor weekly.  12. Dysphagia: continue SLP 13. Leukocytosis: check CBC with diff 14. Decreased appetite: started megace to boost appetite.     LOS: 3 days A FACE TO FACE EVALUATION WAS PERFORMED  Clide Deutscher Lexander Tremblay 06/19/2021, 9:56 AM

## 2021-06-19 NOTE — Progress Notes (Addendum)
Occupational Therapy Session Note  Patient Details  Name: Olivia Shah MRN: [id number] Date of Birth: 04/01/1956  Today's Date: 06/19/2021 OT Individual Time: 1330-1430 OT Individual Time Calculation (min): 60 min    Short Term Goals: Week 1:  OT Short Term Goal 1 (Week 1): Patient will don UB clothing with Min A seated EOB. OT Short Term Goal 2 (Week 1): Patient will complet e1/3 parts of LB clothing at bed level with Mod A. OT Short Term Goal 3 (Week 1): Patient will complete sit to stand transfer with Max A and LRAD.  Skilled Therapeutic Interventions/Progress Updates:    Cotreatment with skilled PT to facilitate collaborative efforts to maximize pts performance with OT focusing on functional positioning and posture in preparation for and during self care tasks. Pt asleep semi reclined in bed with sister at bedside.  Pt easily awakened with multimodal cues.  Significant encouragement needed throughout session to enhance participation. Total assist +2 supine to sit.  Pt needed max multimodal cues and max assist to attend to left side and to donn jacket. She was able to maintain static sitting balance with CGA once upright but very kyphotic and extreme downward head posture despite consistent cues and manual repositioning intermittently.  Total assist to donn both shoes.  Max assist +2 sit to stand and pivoting however due to pt prematurely sitting, w/c approach provided.  Transported to gym and completed stand pivot to EOM with max assist and significant forward head posture.  Attempted to facilitate functional reach in unsupported sitting using various activities and visual cues however pt did not initiate or maintain any grasp or reach despite max multimodal cueing and forward chaining.  Pt provided manual positioning for upright posture of trunk and neck with mirror placed in front of pt for feedback.  Pt completed another stand pivot to w/c with max assist +2 again with downward head and  gaze.  Transported back to room and trialed use of stedy for sit<>stand however required max +2 and pt locking her knees and resisting sitting on perch.  Transferred to bed with total assist +2. Call bell in reach, bed alarm on.   Therapy Documentation Precautions:  Precautions Precautions: Fall Precaution Comments: Cortical blindness, seizure preacutions, high fall risk Restrictions Weight Bearing Restrictions: No    Therapy/Group: Individual Therapy  Ezekiel Slocumb 06/19/2021, 2:40 PM

## 2021-06-19 NOTE — Progress Notes (Signed)
Physical Therapy Session Note  Patient Details  Name: Olivia Shah MRN: [id number] Date of Birth: Mar 31, 1956  Today's Date: 06/19/2021 PT Individual Time: 3154-0086 PT Individual Time Calculation (min): 40 min   Short Term Goals: Week 1:  PT Short Term Goal 1 (Week 1): Pt will complete bed mobility with maxA of 1 person PT Short Term Goal 2 (Week 1): Pt will complete sit<>stand transfer with maxA of 1 person PT Short Term Goal 3 (Week 1): Pt will complete bed<>chair transfers with maxA and LRAD PT Short Term Goal 4 (Week 1): Pt will demonstrate improved alertness and participation to actively engage in therapy sessions   Skilled Therapeutic Interventions/Progress Updates:   Pt received supine in bed and agreeable to PT. Family present throughout session assisting with dressing and then requiring cues to prevent assisting pt in unsafe manner with SB transfers by pulling on UE. Rolling R and L with total A due to poor initiation from patient to don pants. Supine>sit with total fading to max assist as pt eventually able to initiate use of UE to push into sitting. Sitting balance EOB with supervision assist while PT setting up room for slide board transfer. Pt performed SB transfer to 18x18 WC with mod-max assist and max cues for initiation of movement aswith UE support on SB.   Pt noted to have significant pressure on skin from WC arm rests. PT Obtained 20x16 WC and set to hemi height.  Additional SB to 20x16 WC with max assist for safety and cues as listed above for pt and family to decrease assistance to improve safety of transfer. Patient left sitting in [facility] with call bell in reach and all needs met.         Therapy Documentation Precautions:  Precautions Precautions: Fall Precaution Comments: Cortical blindness, seizure preacutions, high fall risk Restrictions Weight Bearing Restrictions: No  Pain: denies    Therapy/Group: Individual Therapy  Lorie Phenix 06/19/2021,  8:46 AM

## 2021-06-20 NOTE — Progress Notes (Signed)
Physical Therapy Session Note  Patient Details  Name: Olivia Shah MRN: [id number] Date of Birth: [birth year]  Today's Date: 06/20/2021 PT Individual Time: 0354-6568 PT Individual Time Calculation (min): 53 min   Short Term Goals: Week 1:  PT Short Term Goal 1 (Week 1): Pt will complete bed mobility with maxA of 1 person PT Short Term Goal 2 (Week 1): Pt will complete sit<>stand transfer with maxA of 1 person PT Short Term Goal 3 (Week 1): Pt will complete bed<>chair transfers with maxA and LRAD PT Short Term Goal 4 (Week 1): Pt will demonstrate improved alertness and participation to actively engage in therapy sessions  Skilled Therapeutic Interventions/Progress Updates:    Pt seated in w/c on arrival and agreeable to therapy with her niece present. No complaint of pain. Pt transported to therapy gym for time management and energy conservation. Session focused on standing tolerance and gait training in //bars. Pt stood with max A to // bars, requiring max VC for for hip extension and anterior weight shift. Sit to stand x 4 before progressing to standing marching 2 x 10 with max VC. Gait 3 x 8 ft with // bars and extended rest breaks. Max A to facilitate weight shift and progress foot BIL, pt favors short steps when un assisted. Pt benefited from visual feedback from mirror to assist with maintaining midline and upright posture. Pt returned to room and requested to return to bed. slideboard transfer with max A and cues for technique and hand placement. Mod A sit>supine for LE management. Pt with some lethargy throughout out session, requiring max VC to initiate movement. Pt remained in room with family present and was left with all needs in reach and alarm active.   Therapy Documentation Precautions:  Precautions Precautions: Fall Precaution Comments: Cortical blindness, seizure preacutions, high fall risk Restrictions Weight Bearing Restrictions: No    Therapy/Group: Individual  Therapy  Mickel Fuchs 06/20/2021, 2:56 PM

## 2021-06-20 NOTE — Progress Notes (Addendum)
Occupational Therapy Session Note  Patient Details  Name: Olivia Shah MRN: [id number] Date of Birth: 1956-03-08  Today's Date: 06/20/2021 OT Individual Time: 0850-1000 ; and 1130-1200 OT Individual Time Calculation (min): 70 min ; and 30 min   Short Term Goals: Week 1:  OT Short Term Goal 1 (Week 1): Patient will don UB clothing with Min A seated EOB. OT Short Term Goal 2 (Week 1): Patient will complet e1/3 parts of LB clothing at bed level with Mod A. OT Short Term Goal 3 (Week 1): Patient will complete sit to stand transfer with Max A and LRAD.  Skilled Therapeutic Interventions/Progress Updates:    First session: Pt supine in bed asleep, able to arouse with minimal cues.  Pt needed max encouragement to participate but ultimately agreeable to working with OT.  Supine to sit with max assist.  Pt donned grip socks with max assist in figure 4 position.  Stand pivot EOB to w/c with mod assist (therapist positioned in front of pt to increase confidence).  Pt transported to sink and completed UB/LB bathing and dressing.  Max multimodal cues needed throughout and step by step instruction for sequencing and initiation.  Mod assist to doff shirt overhead. Mod assist to Bathe UB.  Pt perseverating on washing arms and needing cues to progress to next body part.  Pt able to wash tops of thighs supervision. Pt instructed to stand after threading pants over feet and knees with max assist.  Pt perseverating on holding onto pants and when provided cues for hand placement in preperation to stand, pt repeatedly reaching back to grasp pants.  Max assist for initial boost sit to stand and then CGA with BUE support on sink for total assist washing of peri region and to donn brief and pull pants over hips.  Pt frequently needing cues to increase upright head posture due to looking down at floor.  Pt nodding head yes when asked if she wanted to brush teeth.  Pt brushed teeth with setup and min assist sitting  sinkside.  Call bell in reach, seat belt alarm on at end of session. Improved pt participation noted today but still needing significant cueing due to limited initiation and difficulty sequencing and also noted pt perseverating at times.    Second Session: Pt sitting in w/c with niece in room, pts head hanging forward appearing to be asleep.  Easily awakened and pt agreeable to working with OT.  Pts niece reports her favorite thing her aunt bakes is cornbread.  Pt nodding yes in agreement and smiling.  Pt transported to ADL kitchen and participated in recalling recipe for cornbread and gathered necessary items in preparation.  Pt able to recall a few ingredients including 2 eggs, sugar, vegetable oil, and flour.  Pt unable to recall what type of flour or how much of each would be needed.  Pt needing max multimodal cues to scan to left side to open lower cabinet (seated in w/c) to retrieve needed baking pan.  Pt then positioned facing counter and completed sit to stand to retrieve mixing bowl from overhead cabinet.  Pt needed max assist for initial boost then CGA while reaching for bowl.  Pt self initiated stand to sit with quick uncontrolled descent needing max assist to safely complete transfer.  Returned to room, call bell in reach, seat belt alarm on.  Therapy Documentation Precautions:  Precautions Precautions: Fall Precaution Comments: Cortical blindness, seizure preacutions, high fall risk Restrictions Weight Bearing Restrictions: No  Therapy/Group: Individual Therapy  Ezekiel Slocumb 06/20/2021, 11:02 AM

## 2021-06-20 NOTE — Progress Notes (Signed)
Occupational Therapy Session Note  Patient Details  Name: Olivia Shah MRN: [id number] Date of Birth: August 10, 1956  Today's Date: 06/20/2021 OT Individual Time: 4158-3094 OT Individual Time Calculation (min): 26 min    Short Term Goals: Week 1:  OT Short Term Goal 1 (Week 1): Patient will don UB clothing with Min A seated EOB. OT Short Term Goal 2 (Week 1): Patient will complet e1/3 parts of LB clothing at bed level with Mod A. OT Short Term Goal 3 (Week 1): Patient will complete sit to stand transfer with Max A and LRAD.  Skilled Therapeutic Interventions/Progress Updates:    Pt sitting in wheelchair to start session with family member present.  She was agreeable to participation and was oriented to place and situation when asked.  Worked on sit to stand from the wheelchair with use of the bariatric stedy.  Total assist for sit to stand from the lower chair with min assist for standing balance.  She was able to maintain static standing with BUE support for up to 1.5 mins.  Mod demonstrational cueing to maintain hip extension.  Also worked on squat to stand transitions for BLE strengthening from the folded seat on the Sedan.  Finished session with pt resting in the wheelchair with the call button and phone in reach and safety belt in place.    Therapy Documentation Precautions:  Precautions Precautions: Fall Precaution Comments: Cortical blindness, seizure preacutions, high fall risk Restrictions Weight Bearing Restrictions: No  Pain: Pain Assessment Pain Scale: Faces Pain Score: 0-No pain ADL: See Care Tool Section for some details of mobility and selfcare   Therapy/Group: Individual Therapy  Raechell Singleton OTR/L 06/20/2021, 4:00 PM

## 2021-06-22 DIAGNOSIS — R5381 Other malaise: Secondary | ICD-10-CM | POA: Diagnosis not present

## 2021-06-22 LAB — BASIC METABOLIC PANEL
Anion gap: 12 (ref 5–15)
BUN: 14 mg/dL (ref 8–23)
CO2: 22 mmol/L (ref 22–32)
Calcium: 8.8 mg/dL — ABNORMAL LOW (ref 8.9–10.3)
Chloride: 100 mmol/L (ref 98–111)
Creatinine, Ser: 0.99 mg/dL (ref 0.44–1.00)
GFR, Estimated: >60 mL/min (ref >60)
Glucose, Bld: 178 mg/dL — ABNORMAL HIGH (ref 70–99)
Potassium: 3.6 mmol/L (ref 3.5–5.1)
Sodium: 134 mmol/L — ABNORMAL LOW (ref 135–145)

## 2021-06-22 LAB — CBC
HCT: 40.1 % (ref 36.0–46.0)
Hemoglobin: 13.2 g/dL (ref 12.0–15.0)
MCH: 29.2 pg (ref 26.0–34.0)
MCHC: 32.9 g/dL (ref 30.0–36.0)
MCV: 88.7 fL (ref 80.0–100.0)
Platelets: 296 10*3/uL (ref 150–400)
RBC: 4.52 MIL/uL (ref 3.87–5.11)
RDW: 17.5 % — ABNORMAL HIGH (ref 11.5–15.5)
WBC: 7 10*3/uL (ref 4.0–10.5)
nRBC: 0 % (ref 0.0–0.2)

## 2021-06-22 MED ORDER — MAGNESIUM GLUCONATE 500 MG PO TABS
250.0000 mg | ORAL_TABLET | Freq: Every day | ORAL | Status: DC
  Administered 2021-06-22: 250 mg via ORAL
  Filled 2021-06-22 (×2): qty 1

## 2021-06-22 NOTE — Progress Notes (Signed)
PROGRESS NOTE   Subjective/Complaints: Walking in the parallel bars Labs stable today  ROS: denies pain, +visual deficits   Objective:   No results found. Recent Labs    06/19/21 1136 06/22/21 0940  WBC 8.7 7.0  HGB 12.5 13.2  HCT 38.4 40.1  PLT 290 296   No results for input(s): NA, K, CL, CO2, GLUCOSE, BUN, CREATININE, CALCIUM in the last 72 hours.   Intake/Output Summary (Last 24 hours) at 06/22/2021 1116 Last data filed at 06/22/2021 0700 Gross per 24 hour  Intake 240 ml  Output --  Net 240 ml        Physical Exam: Vital Signs Blood pressure 93/65, pulse 79, temperature 98.2 F (36.8 C), resp. rate 14, height 5\' 2"  (1.575 m), weight 79.2 kg, SpO2 98 %. Gen: no distress, normal appearing HEENT: +cortical blindness Cardio: Reg rate Chest: normal effort, normal rate of breathing Abd: soft, non-distended Ext: no edema Psych: pleasant, normal affect Skin: intact Neurological:     Mental Status: She is alert. She is disoriented.     Comments: Alert and oriented x1 Motor: Limited due to cognition, however left side appears weaker than right  Unable to see 2 fingers in front of her face and could not see me on the left. Left inattention with delayed output.  She was able to follow occasional one step motor commands with tactile cues.    Psychiatric:        Mood and Affect: Affect is blunt.        Speech: Speech is delayed.        Behavior: Behavior is slowed.        Cognition and Memory: Cognition is impaired.    Assessment/Plan: 1. Functional deficits which require 3+ hours per day of interdisciplinary therapy in a comprehensive inpatient rehab setting. Physiatrist is providing close team supervision and 24 hour management of active medical problems listed below. Physiatrist and rehab team continue to assess barriers to discharge/monitor patient progress toward functional and medical goals  Care  Tool:  Bathing    Body parts bathed by patient: Right arm, Left arm, Chest, Abdomen, Face   Body parts bathed by helper: Front perineal area, Buttocks, Right lower leg, Left lower leg, Left upper leg, Right upper leg     Bathing assist Assist Level: Maximal Assistance - Patient 24 - 49%     Upper Body Dressing/Undressing Upper body dressing   What is the patient wearing?: Pull over shirt    Upper body assist Assist Level: Total Assistance - Patient < 25%    Lower Body Dressing/Undressing Lower body dressing      What is the patient wearing?: Incontinence brief, Pants     Lower body assist Assist for lower body dressing: Total Assistance - Patient < 25%     Toileting Toileting    Toileting assist Assist for toileting: Total Assistance - Patient < 25%     Transfers Chair/bed transfer  Transfers assist  Chair/bed transfer activity did not occur: Safety/medical concerns  Chair/bed transfer assist level: Maximal Assistance - Patient 25 - 49% (slideboard)     Locomotion Ambulation   Ambulation assist   Ambulation activity  did not occur: Safety/medical concerns  Assist level: Maximal Assistance - Patient 25 - 49% Assistive device: Parallel bars Max distance: 8 ft   Walk 10 feet activity   Assist  Walk 10 feet activity did not occur: Safety/medical concerns        Walk 50 feet activity   Assist Walk 50 feet with 2 turns activity did not occur: Safety/medical concerns         Walk 150 feet activity   Assist Walk 150 feet activity did not occur: Safety/medical concerns         Walk 10 feet on uneven surface  activity   Assist Walk 10 feet on uneven surfaces activity did not occur: Safety/medical concerns         Wheelchair     Assist Is the patient using a wheelchair?: No             Wheelchair 50 feet with 2 turns activity    Assist            Wheelchair 150 feet activity     Assist          Blood  pressure 93/65, pulse 79, temperature 98.2 F (36.8 C), resp. rate 14, height 5\' 2"  (1.575 m), weight 79.2 kg, SpO2 98 %.  Medical Problem List and Plan: 1.  Deficits with mobility, swallowing, cognition secondary to seizures status post meningioma resection.             -patient may not shower             -ELOS/Goals: 14-17 days/Supervision/Min A             Continue CIR  Vitamin D level reviewed and normal 2.  Impaired mobility: Continue Lovenox             -antiplatelet therapy: N/A 3. Pain Management: PRN meds 4. Mood: LCSW to follow for evaluation and support.              -antipsychotic agents: N/A 5. Neuropsych: This patient is not capable of making decisions on her own behalf. 6. Skin/Wound Care: Routine pressure relief measures.  7. Fluids/Electrolytes/Nutrition: Monitor I/Os 8. HTN: Monitor BP TID--continue Losartan, amlodipine, hydralazine.             Monitor with increased mobility 9.T2DM: Hgb A1C-6.6 and well controlled on home regimen 1000 mg am/500 mg pm             --Will discontinue Lantus after pm dose. Resume metformin as will off steroids.              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 Contacted NSGY to discuss with niece and to advise [place] when repeat imaging is warranted- discussed this with family 80.  Hypokalemia: normalized, monitor weekly.  12. Dysphagia: continue SLP 13. Leukocytosis: normalized 14. Decreased appetite: continue megace to boost appetite.  15. Suboptimal with magnesium: start magnesium glucaonate 250mg  HS.     LOS: 6 days A FACE TO FACE EVALUATION WAS PERFORMED  Olivia Shah Olivia Shah 06/22/2021, 11:16 AM

## 2021-06-22 NOTE — Progress Notes (Signed)
Speech Language Pathology Daily Session Note  Patient Details  Name: DAYSI BOGGAN MRN: [id number] Date of Birth: October 15, 1955  Today's Date: 06/22/2021 SLP Individual Time: 0917-1000 SLP Individual Time Calculation (min): 43 min  Short Term Goals: Week 1: SLP Short Term Goal 1 (Week 1): Pt will follow 1 step commands with 30% accuracy with max A multimodal cues. SLP Short Term Goal 2 (Week 1): Pt will demonstrate focused/sustained attention in 1-2 minute intervals with max A multimodal cues in 60% of opportunities. SLP Short Term Goal 3 (Week 1): Pt will respond to simple yes/no question verbally or nonverbally with 80% accuracy in 60% of opportunities. SLP Short Term Goal 4 (Week 1): Pt will increase verbal output, speaking at word level in 30% of opportunities with max A multimodal cues. SLP Short Term Goal 5 (Week 1): Pt will consume current diet dys 1 textures and thin liquids with minimal overt s/s aspiration and use of swallow strategies with max A verbal cues. SLP Short Term Goal 6 (Week 1): Pt will participate in continued assessment of cognitive linguistic and visual skills.  Skilled Therapeutic Interventions:Skilled ST service focused on cognitive and swallow skills. Pt demonstrated appropriate greetings and alertness during treatment session. Pt was orientated to general place and situation ([place]? [place], Aaron Edelman tumor) but disorientated to time. Pt was able to read at word level, time orientation and recalled in 1-3 minute intervals with mod A verbal cues. Pt was able to identify objects in a field of 3 in 3/3 opportunities. Pt demonstrated increase deficits in identifying objects by function or responsive naming in a field of 3 in 2/3 opportunities. Pt was unable to locate call bell. SLP placed "HELP" sign on soft touch call bell, pt was able to return demonstration. Pt was able recall call bell function in 1-10 minute intervals, however only able to locate/functionally  demonstrate in 1/10 opportunities. Pt began pressing safety alarm belt each time SLP requested use of call bell. SLP provided education. Pt consumed pured texture snack, magic cup. Pt demonstrated ability to self feed with mod A verbal cues to continue self-feeding with SLP scooping ice cream due to frozen texture. Pt was left with safety belt/alarm set and call bell within reach. Recommend to continue ST services.       Pain Pain Assessment Pain Score: 0-No pain  Therapy/Group: Individual Therapy  Kathryne Ramella  [facility] 06/22/2021, 5:59 PM

## 2021-06-22 NOTE — Progress Notes (Signed)
Occupational Therapy Session Note  Patient Details  Name: Olivia Shah MRN: [id number] Date of Birth: 1956/08/16  Today's Date: 06/22/2021 OT Individual Time: 8638-1771 OT Individual Time Calculation (min): 43 min    Short Term Goals: Week 1:  OT Short Term Goal 1 (Week 1): Patient will don UB clothing with Min A seated EOB. OT Short Term Goal 2 (Week 1): Patient will complet e1/3 parts of LB clothing at bed level with Mod A. OT Short Term Goal 3 (Week 1): Patient will complete sit to stand transfer with Max A and LRAD.  Skilled Therapeutic Interventions/Progress Updates:    Pt sitting up in w/c with best friend in room. Friend concerned that pt is not eating; she did not eat breakfast or lunch. Pt nodding head when asked if she feels up to OT session.  Pt required total assist to weight shift and scoot pelvis towards edge of seat in prep for stand pivot.  Stand pivot with mod assist +2 for initial boost and max manual cues to weight shift for stepping and also for RW management.  Ultimately w/c approach needed prematurely due to pt attempting to sit halfway through transfer and pushing RW forward and away from self despite therapist cueing to bring closer.    Transported to gym and completed stand pivot to EOM needing same assist as previously mentioned.  Attempted dynamic standing game however pt requiring max assist to stand and then pt self initiating sitting back down immediately.  Downgraded task to dynamic seated reach to the left (to encourage left visual scanning and head turn) and match cards on vertical board placed in front. Pt with very little initiation and requiring at times max hand over hand assist to participate then intermittently pt would reach for card and place without hand over hand but still required max verbal and visual cues.    Sit<>stand at stedy with max assist +2 transport to w/c.  Returned to room with friend present.  Call bell in reach, seat alarm on at end of  session.   Therapy Documentation Precautions:  Precautions Precautions: Fall Precaution Comments: Cortical blindness, seizure preacutions, high fall risk Restrictions Weight Bearing Restrictions: No    Therapy/Group: Individual Therapy  Ezekiel Slocumb 06/22/2021, 1:32 PM

## 2021-06-22 NOTE — Progress Notes (Signed)
Occupational Therapy Session Note  Patient Details  Name: Olivia Shah MRN: [id number] Date of Birth: 1956-02-01  Today's Date: 06/22/2021 OT Individual Time: 7903-8333 OT Individual Time Calculation (min): 43 min    Short Term Goals: Week 1:  OT Short Term Goal 1 (Week 1): Patient will don UB clothing with Min A seated EOB. OT Short Term Goal 2 (Week 1): Patient will complet e1/3 parts of LB clothing at bed level with Mod A. OT Short Term Goal 3 (Week 1): Patient will complete sit to stand transfer with Max A and LRAD.  Skilled Therapeutic Interventions/Progress Updates:    Patient in bed on bed pan at start of session, max/dependent to complete hygiene and place clean brief/pull pants up over hips, she is able to roll both right and left with min a, min cues for technique and slight delay after verbal instruction.  Side lying to sitting edge of bed with min A.  She is able to maintain unsupported sitting for hand hygiene with set up, donning shoes/tying max A/dep.  Sit to stand in stedy to assess ability and tolerance with min A x2.  Utilized stedy to recliner, practiced sit to stand from recliner with min A of one and stand by of +2 - she was able to complete x3 but fatigues by third attempt.  She remained seated in recliner at close of session, seat belt alarm set and call bell in hand.    Therapy Documentation Precautions:  Precautions Precautions: Fall Precaution Comments: Cortical blindness, seizure preacutions, high fall risk Restrictions Weight Bearing Restrictions: No   Therapy/Group: Individual Therapy  Carlos Levering 06/22/2021, 7:36 AM

## 2021-06-22 NOTE — Progress Notes (Signed)
Received a call from family, was able to come by on rounds today to see the pt. Still having a lot of lethargy, some waxing/waning mental status. Rehab team has been working hard to help her progress, some concern regarding visual acuity but her mental status (for me today) really wasn't sufficient to accurately evaluate it. Discussed with the family for a long time, given the concern regarding vision, will get a new MRI to r/o PCA infarcts to make sure she doesn't have cortical blindness or another unexpected cause. 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 Will have to see when neurology is comfortable decreasing her AEDs, I warned the family that it still may be premature.   -will f/u on MRI and d/w neurology

## 2021-06-22 NOTE — Progress Notes (Signed)
Physical Therapy Session Note  Patient Details  Name: Olivia Shah MRN: [id number] Date of Birth: [birth year]  Today's Date: 06/22/2021 PT Individual Time: 0800-0855 PT Individual Time Calculation (min): 55 min   Short Term Goals: Week 1:  PT Short Term Goal 1 (Week 1): Pt will complete bed mobility with maxA of 1 person PT Short Term Goal 2 (Week 1): Pt will complete sit<>stand transfer with maxA of 1 person PT Short Term Goal 3 (Week 1): Pt will complete bed<>chair transfers with maxA and LRAD PT Short Term Goal 4 (Week 1): Pt will demonstrate improved alertness and participation to actively engage in therapy sessions  Skilled Therapeutic Interventions/Progress Updates:    Patient received sitting up in bed agreeable to PT. She denies pain. Patient able to attend to PT on L side. MinA rolling to change brief, ModA to don pants/socks primarily due to poor attention to task. She was able to come sit edge of bed with Max verbal cues and MinA. Patient completing sit <> stand from elevated bed with MinA x2 and assist to finish pulling her pants up. Stand pivot to wc with MinA x2 and max multimodal cues to initiate task. PT transport in wc to therapy gym for time management and energy conservation. Patient completing sit <> stand in // bars with ModA and max cues for initiation. Gait x16ft with MinA + wc follow and max cues for continuity of task and initiation. Very short step length B, forward flexed posture, NBOS. Patient using forearms on // bars for support and did not rise up to hands despite max efforts from PT. She denies LBP. Patient completing stepping to target task to encourage larger step length- minimal success. Gait overground with RW + ModA x2 to stand x2 feet. Very poor attention to task and increasing fatigue. Patient returning to room in wc, seatbelt alarm on, call light within reach.  -bed mob MinA -STS MinA x2 SPT MInA x2 -// bars x42ft -stepping to target -gait c RW x67ft -up  in chair   Therapy Documentation Precautions:  Precautions Precautions: Fall Precaution Comments: Cortical blindness, seizure preacutions, high fall risk Restrictions Weight Bearing Restrictions: No     Therapy/Group: Individual Therapy  Karoline Caldwell, PT, DPT, CBIS  06/22/2021, 7:38 AM

## 2021-06-23 ENCOUNTER — Inpatient Hospital Stay (HOSPITAL_COMMUNITY): Payer: Medicare Other

## 2021-06-23 DIAGNOSIS — G40109 Localization-related (focal) (partial) symptomatic epilepsy and epileptic syndromes with simple partial seizures, not intractable, without status epilepticus: Secondary | ICD-10-CM

## 2021-06-23 DIAGNOSIS — R5381 Other malaise: Secondary | ICD-10-CM | POA: Diagnosis not present

## 2021-06-23 MED ORDER — POLYETHYLENE GLYCOL 3350 17 G PO PACK
17.0000 g | PACK | Freq: Every day | ORAL | Status: DC
  Administered 2021-06-23 – 2021-07-07 (×7): 17 g via ORAL
  Filled 2021-06-23 (×9): qty 1

## 2021-06-23 MED ORDER — AMLODIPINE BESYLATE 5 MG PO TABS
5.0000 mg | ORAL_TABLET | Freq: Every day | ORAL | Status: DC
  Filled 2021-06-23: qty 1

## 2021-06-23 MED ORDER — FLEET ENEMA 7-19 GM/118ML RE ENEM
1.0000 | ENEMA | Freq: Once | RECTAL | Status: AC
  Administered 2021-06-23: 1 via RECTAL
  Filled 2021-06-23: qty 1

## 2021-06-23 MED ORDER — SODIUM CHLORIDE 0.9 % IV SOLN
200.0000 mg | Freq: Two times a day (BID) | INTRAVENOUS | Status: DC
  Administered 2021-06-23 – 2021-06-29 (×12): 200 mg via INTRAVENOUS
  Filled 2021-06-23 (×17): qty 20

## 2021-06-23 MED ORDER — SODIUM CHLORIDE 0.9 % IV SOLN
2000.0000 mg | Freq: Two times a day (BID) | INTRAVENOUS | Status: DC
  Filled 2021-06-23: qty 20

## 2021-06-23 MED ORDER — LEVETIRACETAM IN NACL 1500 MG/100ML IV SOLN
1500.0000 mg | Freq: Two times a day (BID) | INTRAVENOUS | Status: DC
  Administered 2021-06-23 – 2021-06-29 (×12): 1500 mg via INTRAVENOUS
  Filled 2021-06-23 (×13): qty 100

## 2021-06-23 NOTE — Progress Notes (Signed)
Patient with gas and stool scattered through the colon. Will order enema today and  start miralax daily.

## 2021-06-23 NOTE — Progress Notes (Signed)
Physical Therapy Session Note  Patient Details  Name: Olivia Shah MRN: [id number] Date of Birth: June 06, 1956  Today's Date: 06/23/2021 PT Individual Time: 1003-1059 PT Individual Time Calculation (min): 56 min   Short Term Goals: Week 1:  PT Short Term Goal 1 (Week 1): Pt will complete bed mobility with maxA of 1 person PT Short Term Goal 2 (Week 1): Pt will complete sit<>stand transfer with maxA of 1 person PT Short Term Goal 3 (Week 1): Pt will complete bed<>chair transfers with maxA and LRAD PT Short Term Goal 4 (Week 1): Pt will demonstrate improved alertness and participation to actively engage in therapy sessions Week 2:    Week 3:     Skilled Therapeutic Interventions/Progress Updates:   Pain:  Pt reports no pain.  Treatment to tolerance.  Rest breaks and repositioning as needed.  Pt initially sleeping and gradually wakens and agreeable to treatment session w/focus on am ADLs. Pt w/very poor initiation w/all tasks, sleepy this am, reports she didn't sleep well due to sun coming thru window. Performs lower body dressing w/max assist in sitting/standing, upper body w/max cues for initiation, set up only.  stand pivot transfer bed to wc w/max cues for inititation, increased time for intitiation, mod assist of 1.  Sit to stand w/max cues for initiation, cues for sequencing/hand placement, uprigth posture. Gait 43ft x 1, 52ft x 1, 72ft x 1 w/RW W/min to mod assist of 2, therapist cueing wt shifting, advancement of walker to facilitate continued forward progression, cues to increase step length and clearance/tends to shuffle feet., cues for upright posture., cues for upright gaze, second person retrieves chair for rest breaks which pt determines at inconsistent times/needs cues to await chair prior to sitting/decreased safety awareness.   Several min seated rest between efforts.  Pt left in room, oob in wc w/alarm belt set and needs in reach   Therapy Documentation Precautions:   Precautions Precautions: Fall Precaution Comments: Cortical blindness, seizure preacutions, high fall risk Restrictions Weight Bearing Restrictions: No     Therapy/Group: Individual Therapy Callie Fielding, [place] 06/23/2021, 12:30 PM

## 2021-06-23 NOTE — Patient Care Conference (Signed)
Inpatient RehabilitationTeam Conference and Plan of Care Update Date: 06/23/2021   Time: 13:01 PM    Patient Name: Olivia Shah      Medical Record Number: [id number]  Date of Birth: 1956-07-25 Sex: Female         Room/Bed: 4M01C/4M01C-01 Payor Info: Payor: MEDICARE / Plan: MEDICARE PART A AND B / Product Type: *No Product type* /    Admit Date/Time:  06/16/2021  4:08 PM  Primary Diagnosis:  [facility] Problems: Principal Problem:   Debility    Expected Discharge Date: Expected Discharge Date: 07/08/21  Team Members Present: Physician leading conference: Dr. Leeroy Cha Social Worker Present: Ovidio Kin, LCSW Nurse Present: Dorien Chihuahua, RN PT Present: Callie Fielding, PT OT Present: Leretha Pol, OT SLP Present: Charolett Bumpers, SLP PPS Coordinator present : Gunnar Fusi, SLP     Current Status/Progress Goal Weekly Team Focus  Bowel/Bladder             Swallow/Nutrition/ Hydration   upgraded to dys 2 textures in hopes of increase PO intake, neeed encouragment to continue PO intake  Supervision A  tolerance of upgrade diet   ADL's   Performance fluctuates ranging from min assist +2 to max assist +2 for functional transfers; min to max assist for self care tasks; significant downward head posture, left neglect with right gaze; slow processing, poor sequencing/problem solving, and overall appearing apathetic towards participation  min assist  functional transfers, standing endurance, postural alignment especially cervical, self care training   Mobility   min bed mobility + max cues to initiate, min sit to stand/max cues to initiate, gait 28f w/RW min assist of 2, max cues for forward progression/initiation/attention to task.  grossly MinA  initiation, functional transfers, functional strength, L attention, gait progressions, wc mobility   Communication   limited verbal output, can express word/phrase level due to cognition  Min-Supervision A  initating  verbal response, expressing wants/needs   Safety/Cognition/ Behavioral Observations  Improved attention, orientation, Max A initation following 1 step commands  Min A sustained attention, Supervision A intellectual  1 step commands/basic problem solving, intelllectual awareness, sustained attention, recall, orientation   Pain             Skin               Discharge Planning:  HOme to her home with family members coming in to provide care. Have met with MD regarding visual issues and clarification   Team Discussion: Patient with nausea and vomiting with lethargy. Follow up MRI for cortical blindness. Staff note vision appears better than noted on admission. Does have a downward gaze but able to read at a word level and ID objects. MD to follow up on keppra weaning to see if that effects lethargy.  Patient on target to meet rehab goals: Yes, currently min assist to total assist overall. Progress limited by fatigue, left neglect, poor endurance, poor initiation and decreased participation. Goals for discharge set for min assist.   *See Care Plan and progress notes for long and short-term goals.   Revisions to Treatment Plan:  Upgraded diet to D2; monitoring oral holding while encouraging po intake. Working on orientation, recall, following commands reading, etc   Teaching Needs: Medications, secondary risk management, transfers, toileting, etc   Current Barriers to Discharge: Decreased caregiver support, Home enviroment access/layout, and Incontinence  Possible Resolutions to Barriers: Family education     Medical Summary Current Status: craniotomy incision, cortical blindness, flat affect, throwing up  medications  Barriers to Discharge: Medical stability;Wound care;Behavior  Barriers to Discharge Comments: craniotomy incision, cortical blindness, flat affect, throwing up medications Possible Resolutions to Barriers/Weekly Focus: continue to monitor wound daily, MRI brain shows  no acute abnormalities, KUB today, requested pharmacy to convert as many medications to IV as possible   Continued Need for Acute Rehabilitation Level of Care: The patient requires daily medical management by a physician with specialized training in physical medicine and rehabilitation for the following reasons: Direction of a multidisciplinary physical rehabilitation program to maximize functional independence : Yes Medical management of patient stability for increased activity during participation in an intensive rehabilitation regime.: Yes Analysis of laboratory values and/or radiology reports with any subsequent need for medication adjustment and/or medical intervention. : Yes   I attest that I was present, lead the team conference, and concur with the assessment and plan of the team.   Dorien Chihuahua B 06/23/2021, 2:56 PM

## 2021-06-23 NOTE — Progress Notes (Signed)
Speech Language Pathology Weekly Progress and Session Note  Patient Details  Name: Olivia Shah MRN: [id number] Date of Birth: 1956-01-08  Beginning of progress report period: June 17, 2021 End of progress report period: June 23, 2021  Today's Date: 06/23/2021 SLP Individual Time: 1779-3903 SLP Individual Time Calculation (min): 29 min  Short Term Goals: Week 1: SLP Short Term Goal 1 (Week 1): Pt will follow 1 step commands with 30% accuracy with max A multimodal cues. SLP Short Term Goal 1 - Progress (Week 1): Met SLP Short Term Goal 2 (Week 1): Pt will demonstrate focused/sustained attention in 1-2 minute intervals with max A multimodal cues in 60% of opportunities. SLP Short Term Goal 2 - Progress (Week 1): Met SLP Short Term Goal 3 (Week 1): Pt will respond to simple yes/no question verbally or nonverbally with 80% accuracy in 60% of opportunities. SLP Short Term Goal 3 - Progress (Week 1): Met SLP Short Term Goal 4 (Week 1): Pt will increase verbal output, speaking at word level in 30% of opportunities with max A multimodal cues. SLP Short Term Goal 4 - Progress (Week 1): Met SLP Short Term Goal 5 (Week 1): Pt will consume current diet dys 1 textures and thin liquids with minimal overt s/s aspiration and use of swallow strategies with max A verbal cues. SLP Short Term Goal 5 - Progress (Week 1): Met SLP Short Term Goal 6 (Week 1): Pt will participate in continued assessment of cognitive linguistic and visual skills. SLP Short Term Goal 6 - Progress (Week 1): Not met Week 9:      New Short Term Goals: Week 2: SLP Short Term Goal 1 (Week 2): Pt will follow 1 step commands/basic problem solving tasks with max A verbal cues in 60% of opportunties. SLP Short Term Goal 2 (Week 2): Pt will demonstrate orientation x4 with supervision A verbal cues for visual aids. SLP Short Term Goal 3 (Week 2): Pt will demonstrate sustained attention in 3-5 minute intervals with max A verbal  cues. SLP Short Term Goal 4 (Week 2): Pt will idenitify 2 cognitive and 2 phsyical acute deficits with max A multimodal cues. SLP Short Term Goal 5 (Week 2): Pt will consume dys 2 textures and thin liquids with minimal overt s/s aspiration.  Weekly Progress Updates: Pt has made good progress meeting 5 out 6 goals. Pt demonstrated increase sustained attention, verbal responses at word/phrase level, increase orientation and ability to follow functional 1 step commands. Pt is able to read at the word level, identify objects in a field of 3 as well as self-feed when items are placed in right hand with max-mod A verbal cues. Pt's barriers to discharge are reduced PO intake, SLP upgrade diet to dys 2 textures to provide more appealing food options since pt is demonstrating increase attention and improved bolus awareness. Pt continued to demonstrate limited verbal output, difficulty following simple commands, reduced sustained attention, reduced orientation to time and intellectual awareness. Family education is on going and several rotating family members have been present for ST sessions, pt appears to have good family support. Pt would continue to benefit from skilled ST services in order to maximize functional independence and reduce burden of care, requiring 24 hour supervision at discharge with continued skilled ST services.      Intensity: Minumum of 1-2 x/day, 30 to 90 minutes Frequency: 3 to 5 out of 7 days Duration/Length of Stay: 10/12 Treatment/Interventions: Cognitive remediation/compensation;Cueing hierarchy;Functional tasks;Patient/family education;Dysphagia/aspiration precaution training;Internal/external aids;Speech/Language facilitation  Daily Session  Skilled Therapeutic Interventions:  Skilled ST services focused on swallow and cognitive skills. Pt was able to name 2/2 family members present in room. Pt was orientated to place, situation and month with supervision A verbal cues. Pt  required max A multimodal cues to follow 1 step commands in bed with ability to grab bar, but could not pull up despite demonstration cues. Pt had dys 3 textures and thin liquid diet breakfast tray in room, pt was currently on a dys 1 textures and thin liquid diet. Pt requested to consume the banana out of the option given, SLP facilitated assessment of swallow skills with advanced solid diet. Pt was able to self-feed with banana placed in right hand with mod A verbal cues to continue PO consumption (only consuming 1/2 banana.) Pt demonstrated only slight prolonged bolus preparation with ability to clear oral cavity and no overt s/s aspiration. SLP upgraded due to dys 2 textures in hopes of providing a more appealing diet will increase PO intake. Pt agreed to change.  Pt was left in room with call bell within reach and bed/chair alarm set. SLP recommends to continue skilled services.     General    Pain Pain Assessment Pain Score: 0-No pain  Therapy/Group: Individual Therapy  Davanna He  [facility] 06/23/2021, 4:57 PM

## 2021-06-23 NOTE — Progress Notes (Signed)
When administering morning medications patient was only able to take one bite of medication crushed in applesauce before becoming nauseous and vomiting. Pt vomited more than just the medication that was taken in. OT assisted with changing clothes and cleaning pt up. Dr. Adam Phenix made aware. Pt took over 30 minutes to take morning medication yesterday due to difficulty swallowing. Dr. Adam Phenix made aware of this as well.   Milford Cage LPN

## 2021-06-23 NOTE — Progress Notes (Signed)
PROGRESS NOTE   Subjective/Complaints: Throwing up pills- consulted pharmacy regarding changing as many medications as possible to IV MRI brain with no acute findings Does better in the mornings.   ROS: denies pain, +visual deficits- improving   Objective:   MR BRAIN WO CONTRAST  Result Date: 06/23/2021 CLINICAL DATA:  Benign neoplasm, brain/CNS s/p tumor resection, evaluate for PCA infarcts. Visual loss. EXAM: MRI HEAD WITHOUT CONTRAST TECHNIQUE: Multiplanar, multiecho pulse sequences of the brain and surrounding structures were obtained without intravenous contrast. COMPARISON:  05/19/2021 FINDINGS: Brain: There has been expected interval evolution of the infarcts involving the basal ganglia, temporal lobe, inferior frontal lobe, and insula on the right on the prior MRI with a small amount of diffusion weighted signal abnormality remaining. Minimal blood products and/or mineralization are noted in the region of the right caudate infarct. No new infarct is identified. Small T2 hyperintensities in the cerebral white matter and pons are unchanged and nonspecific but compatible with mild chronic small vessel ischemic disease. There is mild cerebral atrophy. Pneumocephalus has resolved. A small extra-axial fluid collection subjacent to the right pterional craniotomy measures up to 7 mm in thickness, smaller than on the prior study. Midline shift has resolved. Assessment for any potential residual skull base tumor is limited on this noncontrast study. Vascular: Major intracranial vascular flow voids are preserved. Skull and upper cervical spine: Right pterional craniotomy. No suspicious marrow lesion. Sinuses/Orbits: Right cataract extraction. Clear paranasal sinuses. Trace bilateral mastoid fluid. Other: None. IMPRESSION: 1. No new infarct or other acute intracranial abnormality. 2. Evolving subacute infarcts and postoperative changes.  Electronically Signed   By: Logan Bores M.D.   On: 06/23/2021 08:34   Recent Labs    06/22/21 0940  WBC 7.0  HGB 13.2  HCT 40.1  PLT 296   Recent Labs    06/22/21 0940  NA 134*  K 3.6  CL 100  CO2 22  GLUCOSE 178*  BUN 14  CREATININE 0.99  CALCIUM 8.8*     Intake/Output Summary (Last 24 hours) at 06/23/2021 1223 Last data filed at 06/22/2021 1848 Gross per 24 hour  Intake 240 ml  Output --  Net 240 ml        Physical Exam: Vital Signs Blood pressure (!) 88/77, pulse 91, temperature 98.1 F (36.7 C), resp. rate 18, height 5\' 2"  (1.575 m), weight 79.2 kg, SpO2 97 %. Gen: no distress, normal appearing HEENT: +cortical blindness Cardio: Reg rate Chest: normal effort, normal rate of breathing Abd: soft, non-distended Ext: no edema Psych: pleasant, normal affect Skin: intact Neurological:     Mental Status: She is alert. She is disoriented.     Comments: Alert and oriented x1 Motor: Limited due to cognition, however left side appears weaker than right  Unable to see 2 fingers in front of her face and could not see me on the left. Left inattention with delayed output.  She was able to follow occasional one step motor commands with tactile cues.   Impaired initiation  Psychiatric:        Mood and Affect: Affect is blunt.        Speech: Speech is delayed.  Behavior: Behavior is slowed.        Cognition and Memory: Cognition is impaired.    Assessment/Plan: 1. Functional deficits which require 3+ hours per day of interdisciplinary therapy in a comprehensive inpatient rehab setting. Physiatrist is providing close team supervision and 24 hour management of active medical problems listed below. Physiatrist and rehab team continue to assess barriers to discharge/monitor patient progress toward functional and medical goals  Care Tool:  Bathing    Body parts bathed by patient: Right arm, Left arm, Chest, Abdomen, Face   Body parts bathed by helper: Front  perineal area, Buttocks, Right lower leg, Left lower leg, Left upper leg, Right upper leg     Bathing assist Assist Level: Maximal Assistance - Patient 24 - 49%     Upper Body Dressing/Undressing Upper body dressing   What is the patient wearing?: Pull over shirt    Upper body assist Assist Level: Total Assistance - Patient < 25%    Lower Body Dressing/Undressing Lower body dressing      What is the patient wearing?: Incontinence brief, Pants     Lower body assist Assist for lower body dressing: Total Assistance - Patient < 25%     Toileting Toileting    Toileting assist Assist for toileting: Total Assistance - Patient < 25%     Transfers Chair/bed transfer  Transfers assist  Chair/bed transfer activity did not occur: Safety/medical concerns  Chair/bed transfer assist level: Maximal Assistance - Patient 25 - 49% (slideboard)     Locomotion Ambulation   Ambulation assist   Ambulation activity did not occur: Safety/medical concerns  Assist level: Maximal Assistance - Patient 25 - 49% Assistive device: Parallel bars Max distance: 8 ft   Walk 10 feet activity   Assist  Walk 10 feet activity did not occur: Safety/medical concerns        Walk 50 feet activity   Assist Walk 50 feet with 2 turns activity did not occur: Safety/medical concerns         Walk 150 feet activity   Assist Walk 150 feet activity did not occur: Safety/medical concerns         Walk 10 feet on uneven surface  activity   Assist Walk 10 feet on uneven surfaces activity did not occur: Safety/medical concerns         Wheelchair     Assist Is the patient using a wheelchair?: No             Wheelchair 50 feet with 2 turns activity    Assist            Wheelchair 150 feet activity     Assist          Blood pressure (!) 88/77, pulse 91, temperature 98.1 F (36.7 C), resp. rate 18, height 5\' 2"  (1.575 m), weight 79.2 kg, SpO2 97  %.  Medical Problem List and Plan: 1.  Deficits with mobility, swallowing, cognition secondary to seizures status post meningioma resection.             -patient may not shower             -ELOS/Goals: 14-17 days/Supervision/Min A             Continue CIR  Vitamin D level reviewed and normal  -Interdisciplinary Team Conference today   2.  Impaired mobility: Continue Lovenox             -antiplatelet therapy: N/A 3. Pain Management: PRN meds 4.  Mood: LCSW to follow for evaluation and support.              -antipsychotic agents: N/A 5. Neuropsych: This patient is not capable of making decisions on her own behalf. 6. Skin/Wound Care: Routine pressure relief measures.  7. Fluids/Electrolytes/Nutrition: Monitor I/Os 8. Hypotension: Monitor BP TID--continue Losartan, hydralazine. Decrease amlodipine to 5mg              Monitor with increased mobility 9.T2DM: Hgb A1C-6.6 and well controlled on home regimen 1000 mg am/500 mg pm             --Will discontinue Lantus after pm dose. Resume metformin as will off steroids.              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 Contacted NSGY to discuss with niece and to advise [place] when repeat imaging is warranted- discussed this with family 73.  Hypokalemia: normalized, monitor weekly.  12. Dysphagia: continue SLP 13. Leukocytosis: normalized 14. Decreased appetite: continue megace to boost appetite.  15. Suboptimal with magnesium: will stop magnesium in case this contributed to vomiting.  16. Vomiting meds: requested pharamcy to switch as many meds to IV as she is throwing them up. Check KUB today.     LOS: 7 days A FACE TO FACE EVALUATION WAS PERFORMED  Clide Deutscher Nichole Keltner 06/23/2021, 12:23 PM

## 2021-06-23 NOTE — Progress Notes (Signed)
Occupational Therapy Session Note  Patient Details  Name: KHADEEJAH CASTNER MRN: [id number] Date of Birth: [birth year]  Today's Date: 06/23/2021 OT Individual Time: 1130-1150 OT Individual Time Calculation (min): 20 min    Short Term Goals: Week 1:  OT Short Term Goal 1 (Week 1): Patient will don UB clothing with Min A seated EOB. OT Short Term Goal 2 (Week 1): Patient will complet e1/3 parts of LB clothing at bed level with Mod A. OT Short Term Goal 3 (Week 1): Patient will complete sit to stand transfer with Max A and LRAD.  Skilled Therapeutic Interventions/Progress Updates:    Patient seated in w/c, family present.  Patient able to introduce family members when asked.  She presents with flat affect and mild delay.  She denies pain and completes sit to stand with CGA from w/c - able to stand for 30 seconds but returns to sitting due to fatigue.  Nursing provided medication at this time.  Patient became nauseous and vomited.  Assisted with changing shirt and cleaning face/hands with max A.   She declined further activity due to ongoing nausea at this time missing last 10 minutes of session.  She remained sitting in w/c, seat belt alarm set and call bell in reach.    Therapy Documentation Precautions:  Precautions Precautions: Fall Precaution Comments: Cortical blindness, seizure preacutions, high fall risk Restrictions Weight Bearing Restrictions: No   Therapy/Group: Individual Therapy  Carlos Levering 06/23/2021, 7:34 AM

## 2021-06-23 NOTE — Plan of Care (Signed)
  Problem: RH Swallowing Goal: LTG Patient will consume least restrictive diet using compensatory strategies with assistance (SLP) Description: LTG:  Patient will consume least restrictive diet using compensatory strategies with assistance (SLP) Flowsheets (Taken 06/23/2021 1348) LTG: Pt Patient will consume least restrictive diet using compensatory strategies with assistance of (SLP): Supervision Goal: LTG Patient will participate in dysphagia therapy to increase swallow function with assistance (SLP) Description: LTG:  Patient will participate in dysphagia therapy to increase swallow function with assistance (SLP) Flowsheets (Taken 06/23/2021 1348) LTG: Pt will participate in dysphagia therapy to increase swallow function with assistance of (SLP): Supervision Goal: LTG Pt will demonstrate functional change in swallow as evidenced by bedside/clinical objective assessment (SLP) Description: LTG: Patient will demonstrate functional change in swallow as evidenced by bedside/clinical objective assessment (SLP) Flowsheets (Taken 06/23/2021 1348) LTG: Patient will demonstrate functional change in swallow as evidenced by bedside/clinical objective assessment: Oropharyngeal swallow

## 2021-06-23 NOTE — Progress Notes (Signed)
Patient ID: Olivia Shah, female   DOB: 09/09/1956, 65 y.o.   MRN: [id number]  Met with pt and spoke with Tiffany-niece to update regarding team conference min assist level goals and target discharge 10/12. Both pleased with how well she is doing and progressing. She is feeling better and can at times see the clock and words-therapist ask her to name. Niece to come in 10/10 from 1'4 pm for education prior to discharge home 10/12 

## 2021-06-23 NOTE — Progress Notes (Signed)
Subjective: No clinical seizures.   ROS: negative except above  Examination  Vital signs in last 24 hours: Temp:  [98 F (36.7 C)-98.1 F (36.7 C)] 98.1 F (36.7 C) (09/27 0644) Pulse Rate:  [91-98] 91 (09/27 0644) Resp:  [14-18] 18 (09/27 0644) BP: (84-95)/(53-77) 88/77 (09/27 0644) SpO2:  [95 %-98 %] 97 % (09/27 0644)  General: Sitting in a wheelchair, not in apparent distress CVS: pulse-normal rate and rhythm RS: breathing comfortably, CTAB Extremities: normal   Neuro: MS: Alert, oriented to place and person, month: September, year 2026, follows commands, was able to do simple addition (5+4 is equal to 9), was able to tell me days of the week forward but not backward (attention deficit) CN: pupils equal and reactive,  EOMI, left visual hemineglect, face symmetric, tongue midline, normal sensation over face, Motor: Antigravity strength in all 4 extremities, patient was sitting in a wheelchair and therefore was unable to cooperate with significant strength testing  Basic Metabolic Panel: Recent Labs  Lab 06/17/21 0504 06/19/21 1136 06/22/21 0940  NA 138  --  134*  K 4.1  --  3.6  CL 101  --  100  CO2 29  --  22  GLUCOSE 165*  --  178*  BUN 24*  --  14  CREATININE 0.89  --  0.99  CALCIUM 9.0  --  8.8*  MG  --  1.9  --     CBC: Recent Labs  Lab 06/17/21 0504 06/19/21 1136 06/22/21 0940  WBC 10.7* 8.7 7.0  NEUTROABS 3.0 3.9  --   HGB 12.7 12.5 13.2  HCT 40.3 38.4 40.1  MCV 89.8 88.9 88.7  PLT 336 290 296     Coagulation Studies: No results for input(s): LABPROT, INR in the last 72 hours.  Imaging MRI brain without contrast 06/23/2021: No new infarct or other acute intracranial abnormality. Evolving subacute infarcts and postoperative changes.     ASSESSMENT AND PLAN: 65 year old female on the Neurosurgery service, with new onset seizures s/p right pterional craniotomy for meningioma resection which was also complicated by right hemispheric strokes.   Focal motor status is improved with intubation and addition of propofol, susbequently successfully weaned 8/27.    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   Recommendations -Weaned phenytoin on 06/03/2021.  Remains seizure-free but has been excessively drowsy per primary team.  Therefore we will reduce Keppra to 1500 mg twice daily. - Continue vimpat 200mg  bid -Continue seizure precautions -As needed IV Ativan 2 mg for clinical seizure-like activity -Management of rest of comorbidities per primary team -Recommend follow-up with neurology in 6 to 8 weeks after discharge -Neurology will follow peripherally.  Please call us for any further questions. -Updated patient's niece on phone and family at bedside   I have spent a total of 35  minutes with the patient reviewing hospital notes,  test results, labs and examining the patient as well as establishing an assessment and plan.  > 50% of time was spent in direct patient care.   Zeb Comfort Epilepsy [facility] For questions after 5pm please refer to AMION to reach the Neurologist on call

## 2021-06-24 DIAGNOSIS — R5381 Other malaise: Secondary | ICD-10-CM | POA: Diagnosis not present

## 2021-06-24 MED ORDER — AMLODIPINE BESYLATE 2.5 MG PO TABS
2.5000 mg | ORAL_TABLET | Freq: Every day | ORAL | Status: DC
  Administered 2021-06-25 – 2021-06-28 (×4): 2.5 mg via ORAL
  Filled 2021-06-24 (×6): qty 1

## 2021-06-24 MED ORDER — MAGNESIUM GLUCONATE 500 MG PO TABS
250.0000 mg | ORAL_TABLET | Freq: Every day | ORAL | Status: DC
  Administered 2021-06-24 – 2021-07-07 (×14): 250 mg via ORAL
  Filled 2021-06-24 (×15): qty 1

## 2021-06-24 NOTE — Progress Notes (Signed)
Speech Language Pathology Daily Session Note  Patient Details  Name: Olivia Shah MRN: [id number] Date of Birth: Jul 27, 1956  Today's Date: 06/24/2021 SLP Individual Time: 1330-1400 SLP Individual Time Calculation (min): 30 min  Short Term Goals: Week 2: SLP Short Term Goal 1 (Week 2): Pt will follow 1 step commands/basic problem solving tasks with max A verbal cues in 60% of opportunties. SLP Short Term Goal 2 (Week 2): Pt will demonstrate orientation x4 with supervision A verbal cues for visual aids. SLP Short Term Goal 3 (Week 2): Pt will demonstrate sustained attention in 3-5 minute intervals with max A verbal cues. SLP Short Term Goal 4 (Week 2): Pt will idenitify 2 cognitive and 2 phsyical acute deficits with max A multimodal cues. SLP Short Term Goal 5 (Week 2): Pt will consume dys 2 textures and thin liquids with minimal overt s/s aspiration.  Skilled Therapeutic Interventions:   Patient seen for skilled ST session in her room with patient in [facility] and two of her sisters present. Patient was awake and alert, verbally responding without significant difficulty to SLP's questions. She was oriented to month and year but not date or approximate date (beginning, middle, end of month) and not day of week. She was able to use calendar with SLP setup, but required mod-max verbal and visual cues to determine day of week by tracking up from date. Patient was able to recall general information from previous therapy sessions, telling SLP she "lifted weights" which was not in either of her PT or OT notes from today. Patient left in [facility] with family members present. She continues to benefit from skilled SLP intervention to maximize cognitive-linguistic and swallow function prior to discharge.  Pain Pain Assessment Pain Scale: 0-10 Pain Score: 0-No pain  Therapy/Group: Individual Therapy  Sonia Baller, MA, CCC-SLP Speech Therapy

## 2021-06-24 NOTE — Progress Notes (Signed)
Physical Therapy Session Note  Patient Details  Name: Olivia Shah MRN: [id number] Date of Birth: [birth year]  Today's Date: 06/24/2021 PT Individual Time: 1314-3888 PT Individual Time Calculation (min): 68 min   Short Term Goals: Week 2:  PT Short Term Goal 1 (Week 2): Pt will perform bed to chair transfer with LRAD and mod A consistently PT Short Term Goal 2 (Week 2): Pt will initiate gait with training with LRAD PT Short Term Goal 3 (Week 2): Pt will tolerate standing x 5 min with LRAD  Skilled Therapeutic Interventions/Progress Updates:   Pt received sitting in w/c and agreeable to therapy session. Pt requesting graham crackers; however, due to current dysphagia diet this is not allowed; pt declining any other available food on her diet.  Transported to/from gym in w/c for time management and energy conservation.   Sit>stands w/c>RW with min assist for lifting to stand throughout session - pt slow to initiate task but demos appropriate hand placement to push up on armrests; however, once in standing does rest forearms on RW hand grips as opposed to extending hips and standing upright with hands on RW. Once fatigued, pt will start to initiate sitting in w/c requiring heavier min assist to control descent.   In standing, with CGA for steadying engaged pt in L visual scanning task to match cards - pt not able to visually scan ~30degrees L of midline to find card match despite verbal, visual, and auditory cuing - pt attempts to match 1 card prior to requiring seated rest break due to limited standing tolerance.  With encouragement, pt agreeable to gait training. Attempted gait using RW ~76ft; however, pt continues to lean forward resting forearms on RW handles despite cuing for upright posture and is not successful at forward stepping despite cuing/facilitation but instead pt starts to sit down in w/c.  Provided pt with [place] walker to attempt gait due to pt's tendency to rest on forearms -  advanced to gait 97ft with min assist of 1 primarily to manage AD direction and +2 w/c follow - pt able to advance LEs in swing and stabilize in stance though with short step lengths, decreased foot clearance, and slow movements with significantly decreased gait speed. +2 required for w/c follow as pt needs to sit rather quickly once fatigued.   Pt declining additional standing at this time due to fatigue. Performed B LE reciprocal movement patterns for strengthening on Kinetron against 30cm/sec resistance for 2 min x2 - therapist providing pt music to promote continuous LE movements. Seated B LE alternating long arc quads wearing 4lb ankle weights 2x15reps. Transported back to room and pt agreeable to remain sitting in w/c for meal - left with needs in reach, seat belt alarm on, and her sister present.   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   Therapy/Group: Individual Therapy  Tawana Scale , PT, DPT, NCS, CSRS 06/24/2021, 3:20 PM

## 2021-06-24 NOTE — Progress Notes (Signed)
PROGRESS NOTE   Subjective/Complaints: Olivia Shah's KUB shows gas and stool scattered throughout colon- received enema and Miralax. Messaged LPN Kacy to see if patient had BM  ROS: denies pain, +visual deficits- improving, +constipation   Objective:   DG Abd 1 View  Result Date: 06/23/2021 CLINICAL DATA:  65 year old female with history of nausea and vomiting. EXAM: ABDOMEN - 1 VIEW COMPARISON:  No priors. FINDINGS: Gas and stool are seen scattered throughout the colon extending to the level of the distal rectum. No pathologic distension of small bowel is noted. No gross evidence of pneumoperitoneum. Surgical clips project over the right upper quadrant of the abdomen, likely from prior cholecystectomy. Surgical clips are also noted in the low anatomic pelvis. IMPRESSION: 1. Nonobstructive bowel gas pattern. 2. No pneumoperitoneum. Electronically Signed   By: Vinnie Langton M.D.   On: 06/23/2021 16:32   MR BRAIN WO CONTRAST  Result Date: 06/23/2021 CLINICAL DATA:  Benign neoplasm, brain/CNS s/p tumor resection, evaluate for PCA infarcts. Visual loss. EXAM: MRI HEAD WITHOUT CONTRAST TECHNIQUE: Multiplanar, multiecho pulse sequences of the brain and surrounding structures were obtained without intravenous contrast. COMPARISON:  05/19/2021 FINDINGS: Brain: There has been expected interval evolution of the infarcts involving the basal ganglia, temporal lobe, inferior frontal lobe, and insula on the right on the prior MRI with a small amount of diffusion weighted signal abnormality remaining. Minimal blood products and/or mineralization are noted in the region of the right caudate infarct. No new infarct is identified. Small T2 hyperintensities in the cerebral white matter and pons are unchanged and nonspecific but compatible with mild chronic small vessel ischemic disease. There is mild cerebral atrophy. Pneumocephalus has resolved. A small  extra-axial fluid collection subjacent to the right pterional craniotomy measures up to 7 mm in thickness, smaller than on the prior study. Midline shift has resolved. Assessment for any potential residual skull base tumor is limited on this noncontrast study. Vascular: Major intracranial vascular flow voids are preserved. Skull and upper cervical spine: Right pterional craniotomy. No suspicious marrow lesion. Sinuses/Orbits: Right cataract extraction. Clear paranasal sinuses. Trace bilateral mastoid fluid. Other: None. IMPRESSION: 1. No new infarct or other acute intracranial abnormality. 2. Evolving subacute infarcts and postoperative changes. Electronically Signed   By: Logan Bores M.D.   On: 06/23/2021 08:34   Recent Labs    06/22/21 0940  WBC 7.0  HGB 13.2  HCT 40.1  PLT 296   Recent Labs    06/22/21 0940  NA 134*  K 3.6  CL 100  CO2 22  GLUCOSE 178*  BUN 14  CREATININE 0.99  CALCIUM 8.8*     Intake/Output Summary (Last 24 hours) at 06/24/2021 8563 Last data filed at 06/23/2021 2236 Gross per 24 hour  Intake 365 ml  Output --  Net 365 ml        Physical Exam: Vital Signs Blood pressure 115/64, pulse 79, temperature 98.4 F (36.9 C), temperature source Oral, resp. rate 16, height 5\' 2"  (1.575 m), weight 79.2 kg, SpO2 95 %. Gen: no distress, normal appearing HEENT: +cortical blindness Cardio: Reg rate Chest: normal effort, normal rate of breathing Abd: soft, distended Ext: no edema Psych: pleasant,  normal affect Skin: intact Neurological:     Mental Status: She is alert. She is disoriented.     Comments: Alert and oriented x1 Motor: Limited due to cognition, however left side appears weaker than right  Unable to see 2 fingers in front of her face and could not see me on the left. Left inattention with delayed output.  She was able to follow occasional one step motor commands with tactile cues.   Impaired initiation  Psychiatric:        Mood and Affect: Affect is  blunt.        Speech: Speech is delayed.        Behavior: Behavior is slowed.        Cognition and Memory: Cognition is impaired.    Assessment/Plan: 1. Functional deficits which require 3+ hours per day of interdisciplinary therapy in a comprehensive inpatient rehab setting. Physiatrist is providing close team supervision and 24 hour management of active medical problems listed below. Physiatrist and rehab team continue to assess barriers to discharge/monitor patient progress toward functional and medical goals  Care Tool:  Bathing    Body parts bathed by patient: Right arm, Left arm, Chest, Abdomen, Face   Body parts bathed by helper: Front perineal area, Buttocks, Right lower leg, Left lower leg, Left upper leg, Right upper leg     Bathing assist Assist Level: Maximal Assistance - Patient 24 - 49%     Upper Body Dressing/Undressing Upper body dressing   What is the patient wearing?: Pull over shirt    Upper body assist Assist Level: Total Assistance - Patient < 25%    Lower Body Dressing/Undressing Lower body dressing      What is the patient wearing?: Incontinence brief, Pants     Lower body assist Assist for lower body dressing: Total Assistance - Patient < 25%     Toileting Toileting    Toileting assist Assist for toileting: Total Assistance - Patient < 25%     Transfers Chair/bed transfer  Transfers assist  Chair/bed transfer activity did not occur: Safety/medical concerns  Chair/bed transfer assist level: Maximal Assistance - Patient 25 - 49% (slideboard)     Locomotion Ambulation   Ambulation assist   Ambulation activity did not occur: Safety/medical concerns  Assist level: Maximal Assistance - Patient 25 - 49% Assistive device: Parallel bars Max distance: 8 ft   Walk 10 feet activity   Assist  Walk 10 feet activity did not occur: Safety/medical concerns        Walk 50 feet activity   Assist Walk 50 feet with 2 turns activity did  not occur: Safety/medical concerns         Walk 150 feet activity   Assist Walk 150 feet activity did not occur: Safety/medical concerns         Walk 10 feet on uneven surface  activity   Assist Walk 10 feet on uneven surfaces activity did not occur: Safety/medical concerns         Wheelchair     Assist Is the patient using a wheelchair?: No             Wheelchair 50 feet with 2 turns activity    Assist            Wheelchair 150 feet activity     Assist          Blood pressure 115/64, pulse 79, temperature 98.4 F (36.9 C), temperature source Oral, resp. rate 16, height 5\' 2"  (  1.575 m), weight 79.2 kg, SpO2 95 %.  Medical Problem List and Plan: 1.  Deficits with mobility, swallowing, cognition secondary to seizures status post meningioma resection.             -patient may not shower             -ELOS/Goals: 14-17 days/Supervision/Min A             Continue CIR  Vitamin D level reviewed and normal 2.  Impaired mobility: Continue Lovenox             -antiplatelet therapy: N/A 3. Pain Management: PRN meds 4. Mood: LCSW to follow for evaluation and support.              -antipsychotic agents: N/A 5. Neuropsych: This patient is not capable of making decisions on her own behalf. 6. Skin/Wound Care: Routine pressure relief measures.  7. Fluids/Electrolytes/Nutrition: Monitor I/Os 8. Hypotension: Monitor BP TID--continue Losartan, hydralazine. Decrease amlodipine to 5mg              Monitor with increased mobility 9.T2DM: Hgb A1C-6.6 and well controlled on home regimen 1000 mg am/500 mg pm             --Will discontinue Lantus after pm dose. Resume metformin as will off steroids.              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 Contacted NSGY to discuss with niece and to advise [place] when repeat  imaging is warranted- discussed this with family 36.  Hypokalemia: normalized, monitor weekly.  12. Dysphagia: continue SLP 13. Leukocytosis: normalized 14. Decreased appetite: continue megace to boost appetite.  15. Suboptimal with magnesium: restart magnesium 250mg  HS 16. Vomiting meds: requested pharamcy to switch as many meds to IV as she is throwing them up. KUB reviewed and shows gas and scattered stool. Enema and Miralax ordered.    LOS: 8 days A FACE TO FACE EVALUATION WAS PERFORMED  Izora Ribas 06/24/2021, 8:33 AM

## 2021-06-24 NOTE — Progress Notes (Signed)
Physical Therapy Weekly Progress Note  Patient Details  Name: Olivia Shah MRN: [id number] Date of Birth: May 03, 1956  Beginning of progress report period: June 17, 2021 End of progress report period: June 24, 2021  Today's Date: 06/24/2021 PT Individual Time: 7858-8502 PT Individual Time Calculation (min): 45 min   Patient has met 4 of 4 short term goals.  Pt is making slow but steady progress towards therapy goals. She is currently mod to max A for bed mobility, min to mod A for SB transfers, min to max A for sit to stand, and has been able to perform very short distance gait in // bars with assist x 2 for safety. Pt remains limited by cognitive deficits including poor ability to motor plan tasks.  Patient continues to demonstrate the following deficits muscle weakness, decreased cardiorespiratoy endurance, impaired timing and sequencing and decreased motor planning, baseline and new visual impairments, decreased initiation, decreased attention, decreased awareness, decreased problem solving, decreased safety awareness, decreased memory, and delayed processing, and decreased sitting balance, decreased standing balance, decreased postural control, and decreased balance strategies and therefore will continue to benefit from skilled PT intervention to increase functional independence with mobility.  Patient progressing toward long term goals..  Continue plan of care.  PT Short Term Goals Week 1:  PT Short Term Goal 1 (Week 1): Pt will complete bed mobility with maxA of 1 person PT Short Term Goal 1 - Progress (Week 1): Met PT Short Term Goal 2 (Week 1): Pt will complete sit<>stand transfer with maxA of 1 person PT Short Term Goal 2 - Progress (Week 1): Met PT Short Term Goal 3 (Week 1): Pt will complete bed<>chair transfers with maxA and LRAD PT Short Term Goal 3 - Progress (Week 1): Met PT Short Term Goal 4 (Week 1): Pt will demonstrate improved alertness and participation to  actively engage in therapy sessions PT Short Term Goal 4 - Progress (Week 1): Met Week 2:  PT Short Term Goal 1 (Week 2): Pt will perform bed to chair transfer with LRAD and mod A consistently PT Short Term Goal 2 (Week 2): Pt will initiate gait with training with LRAD PT Short Term Goal 3 (Week 2): Pt will tolerate standing x 5 min with LRAD  Skilled Therapeutic Interventions/Progress Updates:    Pt received seated in bed eating breakfast with assist from nursing. This therapist takes over. No complaints of pain. Pt agreeable to get dressed and get up to w/c to finish eating breakfast. Pt is dependent to don pants, socks, and shoes at bed level. Pt able to bridge partially in order to pull pants up partially over hips. Rolling L/R with max A in order to fully don pants. Supine to sit with mod A needed for some LE management and trunk elevation, HOB elevated and use of bedrail. Slide board transfer bed to w/c with min A going downhill, cues for head/hips relationship during transfer. Once seated in w/c pt declines to eat any more of her breakfast. Pt taken to therapy gym for remainder of session. Sit to stand in // bars initially with min A, increasing to max A with onset of fatigue. Pt also exhibits delayed processing and impaired motor planning with regards to scooting herself in w/c and sequencing steps of standing. Pt requires cues and visual feedback in standing for upright trunk and hip extension. Standing alt L/R marches 2 x 10 reps in standing. Pt appears to fatigue quickly in standing and attempts to sit after  standing for less than one minute, unable to verbalize why she needed to sit down and unable to follow cues to remain standing. Pt left seated in w/c in room with needs in reach, quick release belt and chair alarm in place.  Therapy Documentation Precautions:  Precautions Precautions: Fall Precaution Comments: Cortical blindness, seizure preacutions, high fall risk Restrictions Weight  Bearing Restrictions: No    Therapy/Group: Individual Therapy   Excell Seltzer, PT, DPT, CSRS 06/24/2021, 9:21 AM

## 2021-06-24 NOTE — Progress Notes (Signed)
Occupational Therapy Session Note  Patient Details  Name: Olivia Shah MRN: [id number] Date of Birth: 10-May-1956  Today's Date: 06/24/2021 OT Individual Time: 1000-1100 OT Individual Time Calculation (min): 60 min    Short Term Goals: Week 1:  OT Short Term Goal 1 (Week 1): Patient will don UB clothing with Min A seated EOB. OT Short Term Goal 2 (Week 1): Patient will complet e1/3 parts of LB clothing at bed level with Mod A. OT Short Term Goal 3 (Week 1): Patient will complete sit to stand transfer with Max A and LRAD.  Skilled Therapeutic Interventions/Progress Updates:    Pt sitting up in w/c, no c/o pain. Shaking head no when asked if she wanted to wash up or change clothes. Agreeable to going to gym to work on balance and endurance.  Pt smiling and making more eye contact with therapist today throughout session.  Pt transported to ortho gym and completed blocked practice sit<>stands needing max assist for initial boost then CGA to min assist to maintain standing.  Pt self initiating stand to sit without warning when fatigued needing mod assist to safely slow descent.  Pt participated in upward reach and left visual scanning task to retrieve horse shoes and transfer to basket placed on high table in front of pt.  Pt needed max multimodal cues to scan to left and frequently attempting to retrieve horseshoes from wrong basket placed in pts right visual field.  Pt returned to room and brushed teeth.  Attempted in standing however pt unable to maintain standing position long enough to initiate task. Also pt having difficulty dividing attention when standing to scan to left side and grasp toothbrush.  Pt more successful in retrieving toothbrush on left side of counter when seated.  Pt brushed teeth with min assist and max multimodal cues.  Call bell in reach, seat alarm on at end of session.  Therapy Documentation Precautions:  Precautions Precautions: Fall Precaution Comments: Cortical  blindness, seizure preacutions, high fall risk Restrictions Weight Bearing Restrictions: No    Therapy/Group: Individual Therapy  Ezekiel Slocumb 06/24/2021, 1:24 PM

## 2021-06-25 NOTE — Progress Notes (Signed)
Occupational Therapy Session Note  Patient Details  Name: Olivia Shah MRN: [id number] Date of Birth: 29-Mar-1956  Today's Date: 06/25/2021 OT Individual Time: 1330-1430 OT Individual Time Calculation (min): 60 min    Short Term Goals: Week 1:  OT Short Term Goal 1 (Week 1): Patient will don UB clothing with Min A seated EOB. OT Short Term Goal 2 (Week 1): Patient will complet e1/3 parts of LB clothing at bed level with Mod A. OT Short Term Goal 3 (Week 1): Patient will complete sit to stand transfer with Max A and LRAD.  Skilled Therapeutic Interventions/Progress Updates:    Pt sitting EOB with nursing just having incontinence of bowel and getting in bed for toileting needs.  Pt required total assist for pericare and clothing mgt.  Also required multimodal cues to initiate rolling left and right as needed and for turning head to the left to compensate for left visual field cut.  Pt completed supine to sit with min assist.  Donned shirt overhead with min assist and step by step cues.  Donned pants with mod assist needing repetitive multimodal cues to initiate task and for sit<>stand at RW.  Pt putting hands in elastic waistband of pants repeatedly.  Therapist asked if pts hands were cold and pt responded yes.  Vcs needed to initiate and problem solve solution of donning jacket.  Pt required multimodal cues including tactile to turn head to left to attend to jacket placed on pts left side.  Pt also needed step by step cues to initiate and sequence through task and min assist to donn jacket.  Pt requesting to return to bed at end of session.  Educated pt on forward weight shifting for left lateral scoot towards [place] and needed mod assist to complete.  Sit to supine with mod assist to support BLE.  Call bell in reach, bed alarm on.   Therapy Documentation Precautions:  Precautions Precautions: Fall Precaution Comments: Cortical blindness, seizure preacutions, high fall risk Restrictions Weight  Bearing Restrictions: No    Therapy/Group: Individual Therapy  Ezekiel Slocumb 06/25/2021, 3:40 PM

## 2021-06-25 NOTE — Progress Notes (Signed)
Physical Therapy Session Note  Patient Details  Name: Olivia Shah MRN: [id number] Date of Birth: 23-Dec-1955  Today's Date: 06/25/2021 PT Individual Time: 1102-1200 PT Individual Time Calculation (min): 58 min   Short Term Goals: Week 1:  PT Short Term Goal 1 (Week 1): Pt will complete bed mobility with maxA of 1 person PT Short Term Goal 1 - Progress (Week 1): Met PT Short Term Goal 2 (Week 1): Pt will complete sit<>stand transfer with maxA of 1 person PT Short Term Goal 2 - Progress (Week 1): Met PT Short Term Goal 3 (Week 1): Pt will complete bed<>chair transfers with maxA and LRAD PT Short Term Goal 3 - Progress (Week 1): Met PT Short Term Goal 4 (Week 1): Pt will demonstrate improved alertness and participation to actively engage in therapy sessions PT Short Term Goal 4 - Progress (Week 1): Met Week 2:  PT Short Term Goal 1 (Week 2): Pt will perform bed to chair transfer with LRAD and mod A consistently PT Short Term Goal 2 (Week 2): Pt will initiate gait with training with LRAD PT Short Term Goal 3 (Week 2): Pt will tolerate standing x 5 min with LRAD  Skilled Therapeutic Interventions/Progress Updates:  Patient seated in w/c and braiding hair forward on head on entrance to room. Patient alert and agreeable to PT session. Pt requires extensive cueing for initiation this session with minimal self-initiated movements. Also requires block of seat or removal of seat to encourage continued movement for improving activity tolerance.   Patient with no pain complaint or outward show of pain throughout session.  Therapeutic Activity: Transfers: Patient performed sit<>stand and stand pivot transfers throughout session with max A for initiation and Min A +2 to perform. Required extensive verbal cues and finally addition of tactile cues with hand over hand placement of UE on armrests/ mat table with quick count to initiate along with gentle lift at tuberosities.   Gait Training:  Patient  ambulated 2' x1/ 20' x1 using EVA walker with Min A for walker mgmt and requiring vc/tc for maintaining reciprocal stepping pattern. One attempt to sit during bout with multimodal cues to continue. +2 required throughout for encouragement and w/c follow. Demonstrated decreased BLE knee flexion, step height, pace, sequential movement pattern and continued flexed forward posture despite forearm support on platform. Provided vc/ tc for L>R foot advancement, upright posture, continued forward progression throughout.  Neuromuscular Re-ed: NMR facilitated during session with focus on standing balance/ tolerance, cognition/ awareness, initiation. Pt guided in standing match of bean bag color to target discs placed to R and L diagonally out from pt at edge of BOS. L targets placed at 45 degrees to promote L visual scanning. Pt requires extensive cues to grasp bags held in differing positions in front of her with R hand but is able to correctly match to color to R side. Seated rest break prior to start of L side. With extensive multimodal cueing and encouragement, then placing beanbag in L hand, pt is unable to lift L arm from platform to participate despite turn of head to see targets. In attempt to name colors, pt is unable to correctly verbalize names of any of 6 colors. Instead renaming the color provided prior. NMR performed for improvements in motor control and coordination, balance, sequencing, judgement, and self confidence/ efficacy in performing all aspects of mobility at highest level of independence.   Patient seated upright in w/c at end of session with brakes locked, belt alarm set,  and all needs within reach.     Therapy Documentation Precautions:  Precautions Precautions: Fall Precaution Comments: Cortical blindness, seizure preacutions, high fall risk Restrictions Weight Bearing Restrictions: No  Pain: Pain Assessment Pain Scale: Faces Pain Score: 0-No pain Faces Pain Scale: No  hurt  Therapy/Group: Individual Therapy  Alger Simons PT, DPT 06/25/2021, 5:04 PM

## 2021-06-25 NOTE — Progress Notes (Signed)
Physical Therapy Session Note  Patient Details  Name: Olivia Shah MRN: [id number] Date of Birth: 11-07-1955  Today's Date: 06/25/2021 PT Individual Time: 0900-0930 PT Individual Time Calculation (min): 30 min   Short Term Goals: Week 2:  PT Short Term Goal 1 (Week 2): Pt will perform bed to chair transfer with LRAD and mod A consistently PT Short Term Goal 2 (Week 2): Pt will initiate gait with training with LRAD PT Short Term Goal 3 (Week 2): Pt will tolerate standing x 5 min with LRAD  Skilled Therapeutic Interventions/Progress Updates:     Patient in bed asleep upon PT arrival. Patient aroused to verbal stimulation and agreeable to PT session. Patient denied pain during session.  Patient reported incontinence at beginning of session, however, found to be continent of inspection.  Therapeutic Activity: Bed Mobility: Patient performed rolling R/L to doff/donn incontinence brief with min A. She performed supine to sit with min A and increased time to initiate. Provided verbal cues for rolling through side-lying and pushing up with bottom arm to come to sitting. Transfers: Patient performed stand pivot bed>w/c using RW with mod A due to loss of motor plan halfway and poor spatial awareness to come to sitting without facilitation of hips into w/c. Remained with significantly forward flexed posture throughout transfer. Provided verbal cues for initiation, sequencing, and safety.  Neuromuscular Re-ed: Patient performed the following balance activities for improved postural control with functional activities: -sitting balance threading pants, reached forward to the floor x6 and able to return to upright sitting with supervision, required min-mod A to thread L leg and min A for R leg; interrupted task after completing R leg to don shirt due to attention deficits, redirected to task after changing her shirt with max cues -sitting balance to doff night shift and don long sleeve turtle neck  with supervision for sitting balance and min A for donning tighter turtle neck -sit to stand x2 pushing up with B upper extremities with min A focused on forward weight shift, initiation, and knee/hip/trunk extension -standing balance to pull up pants x2 due to posterior LOB resulting in return to sitting on first trial, performed balance without upper extremity support with min A and max A for pulling up pants from a second person  Patient in w/c in the room at end of session with breaks locked, seat belt alarm set, and all needs within reach.   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

## 2021-06-25 NOTE — Progress Notes (Signed)
Occupational Therapy Session Note  Patient Details  Name: Olivia Shah MRN: [id number] Date of Birth: [birth year]  Today's Date: 06/25/2021 OT Individual Time: 5284-1324 OT Individual Time Calculation (min): 28 min    Short Term Goals: Week 1:  OT Short Term Goal 1 (Week 1): Patient will don UB clothing with Min A seated EOB. OT Short Term Goal 2 (Week 1): Patient will complet e1/3 parts of LB clothing at bed level with Mod A. OT Short Term Goal 3 (Week 1): Patient will complete sit to stand transfer with Max A and LRAD.  Skilled Therapeutic Interventions/Progress Updates:    Pt in bed to start session with her sister and brother-in-law.  She was able to complete supine to sit with mod assist and increased time secondary to decreased initiation.  She then worked on sit to stand initially with the [place] and then progressing to the use of the walker.  Mod assist for sit to stand from the elevated bed progressing to min assist.  Max demonstrational cueing at times for initiation of standing with max facilitation needed to maintain upright trunk, hip, and cervical extension.  Once standing, she would continue to try and sit down immediately.  Family was present and supportive throughout.  She was able to maintain standing for one interval of one minute, but would try to prop on the RW.  Finished session with transfer back to the bed with mod assist.  Call button and phone in reach with safety alarm in place and family present.    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

## 2021-06-25 NOTE — Progress Notes (Signed)
Speech Language Pathology Daily Session Note  Patient Details  Name: Olivia Shah MRN: [id number] Date of Birth: 09/04/1956  Today's Date: 06/25/2021 SLP Individual Time: 1000-1020 SLP Individual Time Calculation (min): 20 min  Short Term Goals: Week 2: SLP Short Term Goal 1 (Week 2): Pt will follow 1 step commands/basic problem solving tasks with max A verbal cues in 60% of opportunties. SLP Short Term Goal 2 (Week 2): Pt will demonstrate orientation x4 with supervision A verbal cues for visual aids. SLP Short Term Goal 3 (Week 2): Pt will demonstrate sustained attention in 3-5 minute intervals with max A verbal cues. SLP Short Term Goal 4 (Week 2): Pt will idenitify 2 cognitive and 2 phsyical acute deficits with max A multimodal cues. SLP Short Term Goal 5 (Week 2): Pt will consume dys 2 textures and thin liquids with minimal overt s/s aspiration.  Skilled Therapeutic Interventions: Patient seen for skilled ST session focusing on cognitive-linguistic goals. Patient was alert sitting up in [facility] when SLP arrived. She responded to questions but appeared more distracted and less engaged overall than previous session. She required modA verbal and visual cues for using calendar to orient to day of week and date but patient able to state month and year. She would perseverate on previous question asked and perseverated on saying "corn beef hash" when asked about food items she likes for breakfast and required min-modA verbal cues to redirect to topic. Patient had not eaten any breakfast but she did report liking grits. When SLP returned with some food choices, patient had since requested bathroom and RN in room assisting. Patient continues to benefit from skilled SLP intervention to maximize cognitive-linguistic and swallow function prior to discharge.  Pain Pain Assessment Pain Scale: Faces Faces Pain Scale: No hurt  Therapy/Group: Individual Therapy  Sonia Baller, MA, CCC-SLP Speech  Therapy

## 2021-06-26 NOTE — Progress Notes (Addendum)
Occupational Therapy Weekly Progress Note  Patient Details  Name: Olivia Shah MRN: [id number] Date of Birth: 06/23/1956  Beginning of progress report period: June 17, 2021 End of progress report period: June 26, 2021  Today's Date: 06/26/2021 OT Individual Time: 1400-1500 OT Individual Time Calculation (min): 60 min    Patient has met 2 of 3 short term goals.  Pt is progressing slowly towards self care and functional transfer goals secondary to significant motor and ideational apraxia, poor initiation and sequencing skills, and left visual field cut with left inattention. Pts posture is improving and at times she is able to stand with min assist however her performance does fluctuate and sometimes will need total to max assist to initiate transfers and head will return to significant downward and forward posturing.  Pt continues to need max multimodal and repetitive cues to initiate and sequence through tasks.    Patient continues to demonstrate the following deficits: muscle weakness, decreased cardiorespiratoy endurance, impaired timing and sequencing, motor apraxia, decreased coordination, and decreased motor planning, field cut, decreased attention to left, decreased motor planning, and ideational apraxia, decreased initiation, decreased attention, decreased awareness, decreased problem solving, decreased safety awareness, decreased memory, and delayed processing, and decreased sitting balance, decreased standing balance, decreased postural control, and decreased balance strategies and therefore will continue to benefit from skilled OT intervention to enhance overall performance with BADL.  Patient progressing toward long term goals..  Continue plan of care.  OT Short Term Goals Week 1:  OT Short Term Goal 1 (Week 1): Patient will don UB clothing with Min A seated EOB. OT Short Term Goal 1 - Progress (Week 1): Progressing toward goal OT Short Term Goal 2 (Week 1): Patient will  complet e1/3 parts of LB clothing at bed level with Mod A. OT Short Term Goal 2 - Progress (Week 1): Met OT Short Term Goal 3 (Week 1): Patient will complete sit to stand transfer with Max A and LRAD. OT Short Term Goal 3 - Progress (Week 1): Met Week 2:  OT Short Term Goal 1 (Week 2): Pt will complete UB dressing with min assist. OT Short Term Goal 2 (Week 2): Pt will complete LB dressing with min assist. OT Short Term Goal 3 (Week 2): Pt will complete stand pivot toilet transfer with min assist consecutively x 3  Skilled Therapeutic Interventions/Progress Updates:    Pt sitting up in w/c, two of her sisters in room and report concerns about pt not eating at all today.  Therapist collaborated with nurse and SPT, pt, and family, and determined that pt receptive and requesting to eat "Olivia Shah food" and per SPT rice and small pieces of tender chicken okay to trial to encourage food intake in pt.  Pt agreeable to bathing at sink and changing into clean clothes.  Pt needing max multimodal and repetitive cues throughout to follow commands, initiate tasks, and sequence through for UB/LB bathing and dressing.  Pt having significant difficulty orienting and motor planning to donn shirt and pants (pushing arm through head whole of shirt, and attempting to donn pants over head initially before given correction cues).  Pt required max assist to donn shirt and jacket and mod assist to donn pants.  Pt required min assist to bathe UB and pt noted perseverating on washing LUE until cues provided.  Pt able to forward lean and reach to ankles with CGA to wash BLE.  Pt perseverating on attempting to pull pants over hips prior to standing and  a lot of cues to redirect to sit<>stand transfer in order to continue.  Pt requesting to stay sitting in w/c at end of session.  Call bell in reach, seat alarm on.  Therapy Documentation Precautions:  Precautions Precautions: Fall Precaution Comments: Cortical blindness, seizure  preacutions, high fall risk Restrictions Weight Bearing Restrictions: No    Therapy/Group: Individual Therapy  Ezekiel Slocumb 06/26/2021, 3:47 PM

## 2021-06-26 NOTE — Progress Notes (Signed)
Speech Language Pathology Daily Session Note  Patient Details  Name: Olivia Shah MRN: [id number] Date of Birth: 02-14-1956  Today's Date: 06/26/2021 SLP Individual Time: 0800-0830 SLP Individual Time Calculation (min): 30 min  Short Term Goals: Week 2: SLP Short Term Goal 1 (Week 2): Pt will follow 1 step commands/basic problem solving tasks with max A verbal cues in 60% of opportunties. SLP Short Term Goal 2 (Week 2): Pt will demonstrate orientation x4 with supervision A verbal cues for visual aids. SLP Short Term Goal 3 (Week 2): Pt will demonstrate sustained attention in 3-5 minute intervals with max A verbal cues. SLP Short Term Goal 4 (Week 2): Pt will idenitify 2 cognitive and 2 phsyical acute deficits with max A multimodal cues. SLP Short Term Goal 5 (Week 2): Pt will consume dys 2 textures and thin liquids with minimal overt s/s aspiration.  Skilled Therapeutic Interventions:   Patient seen for skilled ST session focusing on cognitive-linguistic goals. When SLP arrived, patient in bed, awake and alert with NT helping with breakfast. She appears to have eaten very little, approximately 5% of solids. Patient refusing any other liquids, solids even when SLP offered to get her something she wanted. Patient would reply generally, saying she wanted some "soul food" but would not specify. When SLP would ask her if she wanted a food item, she would look at SLP then say, "do you eat fruit?", "do you eat biscuits?", etc. Overall mood and affect was flat and patient very difficult to engage in any meaningful conversation. She was able to brush teeth when SLP setup and provided tactile and verbal cue to initiate. She did not make any spontaneous statements or requests. She was left in bed with all needs within reach. She continues to benefit from skilled SLP intervention to maximize cognitive-linguistic and swallow function prior to discharge.  Pain Pain Assessment Pain Scale: Faces Pain Score:  0-No pain Faces Pain Scale: No hurt  Therapy/Group: Individual Therapy  Sonia Baller, MA, CCC-SLP Speech Therapy

## 2021-06-26 NOTE — Progress Notes (Signed)
Physical Therapy Session Note  Patient Details  Name: Olivia Shah MRN: [id number] Date of Birth: 12/09/1955  Today's Date: 06/26/2021 PT Individual Time: 1032-1129 PT Individual Time Calculation (min): 57 min   Short Term Goals: Week 2:  PT Short Term Goal 1 (Week 2): Pt will perform bed to chair transfer with LRAD and mod A consistently PT Short Term Goal 2 (Week 2): Pt will initiate gait with training with LRAD PT Short Term Goal 3 (Week 2): Pt will tolerate standing x 5 min with LRAD  Skilled Therapeutic Interventions/Progress Updates: Pt presents asleep in bed but agreeable to therapy.  Pt required Total A for rolling side to side to don pants.  Pt requires initiation cues and then will hold side rail to maintain sidelying.  Pt transfers sup to sit w/ Total A of 1.  Pt transfers sit to stand w/ A+ 2 and pivots to w/c using RW.  Pt attempts to place FA on handrails for support.  Pt wheeled to main gym for time conservation.  Pt transfers sit to stand w/ A + 2 to EVA walker.  Pt requires manual placement of FA on platform and to grasp handles.  Pt requires A of 1 for upright stance (hand at buttocks/upper chest) w/ 1 assist for walker management.  Pt requires constant verbal cues for  upright posture and increased step length.  Pt amb up to 20' w/ encouragement.  Pt will attempt to sit before surface behind pt.  Pt performed multiple trials w/ seated rest break between trials.  Pt remained sitting in w/c w/ chair alarm on and all needs in reach.     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

## 2021-06-26 NOTE — Progress Notes (Signed)
Patient requested asian food for supper. Family brought in rice and chicken. Patient ate 25% of meal by herself with little assistance and 250 mL of water. Family says they will bring more food in tomorrow.

## 2021-06-27 NOTE — Progress Notes (Signed)
Physical Therapy Session Note  Patient Details  Name: Olivia Shah MRN: [id number] Date of Birth: 11-14-1955  Today's Date: 06/27/2021 PT Individual Time: 1016-1045 PT Individual Time Calculation (min): 29 min   Short Term Goals: Week 2:  PT Short Term Goal 1 (Week 2): Pt will perform bed to chair transfer with LRAD and mod A consistently PT Short Term Goal 2 (Week 2): Pt will initiate gait with training with LRAD PT Short Term Goal 3 (Week 2): Pt will tolerate standing x 5 min with LRAD  Skilled Therapeutic Interventions/Progress Updates:    Pt received seated in WC with no c/o pn and agreeable to therapy. Of note, pt difficult to arose and maintain concentration, though pt was able to answer questions with rest between each. Session focused on strengthening of extremities, trunk control, command following, and attention. Pt completed AAROM 1 x10 shoulder flex for R and L UE and 1 x10 knee ext for R and L, but required strong VC and tactile cues to maintain wakefulness. Pt noticed to have increased attention and command following on R extremities. Pt then faced towards bed and practiced 1x3 B hand hold on bed with pulling towards in preparation of stand. Pt required MaxA for activity. Pt then began vomiting. PT cleaned pt and RN notified. Pt confirmed feeling nauseous, and continued vomiting at end of session. Thus, pt left in care of Rn and also seated  in [facility] with call bell and all needs in reach.   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

## 2021-06-27 NOTE — Progress Notes (Signed)
Occupational Therapy Session Note  Patient Details  Name: Olivia Shah MRN: [id number] Date of Birth: Apr 28, 1956  Today's Date: 06/27/2021 OT Individual Time: 0932-6712 OT Individual Time Calculation (min): 45 min    Short Term Goals: Week 1:  OT Short Term Goal 1 (Week 1): Patient will don UB clothing with Min A seated EOB. OT Short Term Goal 1 - Progress (Week 1): Progressing toward goal OT Short Term Goal 2 (Week 1): Patient will complet e1/3 parts of LB clothing at bed level with Mod A. OT Short Term Goal 2 - Progress (Week 1): Met OT Short Term Goal 3 (Week 1): Patient will complete sit to stand transfer with Max A and LRAD. OT Short Term Goal 3 - Progress (Week 1): Met  Skilled Therapeutic Interventions/Progress Updates:     Pt received in bed asleep with no pain reported once aroused  ADL: Pt completes ADL at overall MOD A Level. Skilled interventions include: setting up environemnt with items needed on L for L attention, increased time provided for pt to process cuing, question cues for pt to notice/respond to backwards orientation of pants, facilitation of pelvis for upright posture during stand step transfers and direct cuing for hand placement during transfers. Pt completes dressing at EOB with MIN A for shirt and MAX A for pants able to thread second LE. Once pants on, pt states she needs to toilet. MOD A transfer to toilet with grab bar after power up with BUE on arm rests. Increased time to [facility] and bladder void on toilet. Pt able to complete posterior hygeine with significantly long rests between wipes seated and VC for initiating sit to stand to manage pants. Pt returned to w/c with MOD A to power up from toilet with grap bar and MOD facilitation of weight shifting for stepping to chair.  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

## 2021-06-27 NOTE — Progress Notes (Signed)
Patient remains lethargic but remains alert and oriented to person.  Ate 25% of breakfast, no lunch and dinner.  Patient had half a nutrition drink with family for lunch.  Patient had a episode of emesis this shift with food contents and medication. Compazine given as ordered and no emesis noted since.  Tolerated IV medication as ordered with no adverse reaction noted.

## 2021-06-28 NOTE — Progress Notes (Signed)
Patient did eat breakfast or any of facility's food today.  Family brought home cooking and she ate 50% lunch and dinner.  Continues on IV medication with no adverse reaction noted. IV line remains patent and intact.  Patient appears more alert and interactive than yesterday.,

## 2021-06-29 DIAGNOSIS — R5381 Other malaise: Secondary | ICD-10-CM | POA: Diagnosis not present

## 2021-06-29 LAB — CBC
HCT: 33.1 % — ABNORMAL LOW (ref 36.0–46.0)
Hemoglobin: 11 g/dL — ABNORMAL LOW (ref 12.0–15.0)
MCH: 29 pg (ref 26.0–34.0)
MCHC: 33.2 g/dL (ref 30.0–36.0)
MCV: 87.3 fL (ref 80.0–100.0)
Platelets: 232 10*3/uL (ref 150–400)
RBC: 3.79 MIL/uL — ABNORMAL LOW (ref 3.87–5.11)
RDW: 17.7 % — ABNORMAL HIGH (ref 11.5–15.5)
WBC: 4.6 10*3/uL (ref 4.0–10.5)
nRBC: 0 % (ref 0.0–0.2)

## 2021-06-29 LAB — BASIC METABOLIC PANEL
Anion gap: 11 (ref 5–15)
BUN: 6 mg/dL — ABNORMAL LOW (ref 8–23)
CO2: 21 mmol/L — ABNORMAL LOW (ref 22–32)
Calcium: 8.5 mg/dL — ABNORMAL LOW (ref 8.9–10.3)
Chloride: 105 mmol/L (ref 98–111)
Creatinine, Ser: 0.84 mg/dL (ref 0.44–1.00)
GFR, Estimated: >60 mL/min (ref >60)
Glucose, Bld: 104 mg/dL — ABNORMAL HIGH (ref 70–99)
Potassium: 2.9 mmol/L — ABNORMAL LOW (ref 3.5–5.1)
Sodium: 137 mmol/L (ref 135–145)

## 2021-06-29 MED ORDER — POTASSIUM CHLORIDE 20 MEQ PO PACK
40.0000 meq | PACK | Freq: Two times a day (BID) | ORAL | Status: DC
  Administered 2021-06-29 – 2021-07-06 (×14): 40 meq via ORAL
  Filled 2021-06-29 (×14): qty 2

## 2021-06-29 MED ORDER — LACOSAMIDE 200 MG PO TABS
200.0000 mg | ORAL_TABLET | Freq: Two times a day (BID) | ORAL | Status: DC
  Administered 2021-06-29 – 2021-07-08 (×18): 200 mg via ORAL
  Filled 2021-06-29 (×18): qty 1

## 2021-06-29 MED ORDER — LEVETIRACETAM 500 MG PO TABS
1500.0000 mg | ORAL_TABLET | Freq: Two times a day (BID) | ORAL | Status: DC
  Administered 2021-06-29 – 2021-07-03 (×8): 1500 mg via ORAL
  Filled 2021-06-29 (×7): qty 3
  Filled 2021-06-29: qty 6
  Filled 2021-06-29: qty 3

## 2021-06-29 NOTE — Plan of Care (Signed)
  Problem: RH Comprehension Communication Goal: LTG Patient will comprehend basic/complex auditory (SLP) Description: LTG: Patient will comprehend basic/complex auditory information with cues (SLP). Flowsheets (Taken 06/29/2021 1627) LTG: Patient will comprehend auditory information with cueing (SLP): (familiar task only) Moderate Assistance - Patient 50 - 74% Note: Downgraded due to progress and carryover   Problem: RH Expression Communication Goal: LTG Patient will express needs/wants via multi-modal(SLP) Description: LTG:  Patient will express needs/wants via multi-modal communication (gestures/written, etc) with cues (SLP) Outcome: Not Applicable Flowsheets (Taken 06/29/2021 1627) LTG: Patient will express needs/wants via multimodal communication (gestures/written, etc) with cueing (SLP): Moderate Assistance - Patient 50 - 74% Note: Pt is able to verbally communicate, goal no longer needed. Goal: LTG Patient will verbally express basic/complex needs(SLP) Description: LTG:  Patient will verbally express basic/complex needs, wants or ideas with cues  (SLP) Flowsheets (Taken 06/29/2021 1627) LTG: Patient will verbally express basic/complex needs, wants or ideas (SLP): (downgraded due to poor progress and engagement) Moderate Assistance - Patient 50 - 74% Note: Downgrade due to poor progress and engagement    Problem: RH Expression Communication Goal: LTG Patient will verbally express basic/complex needs(SLP) Description: LTG:  Patient will verbally express basic/complex needs, wants or ideas with cues  (SLP) Flowsheets (Taken 06/29/2021 1627) LTG: Patient will verbally express basic/complex needs, wants or ideas (SLP): (downgraded due to poor progress and engagement) Moderate Assistance - Patient 50 - 74% Note: Downgrade due to poor progress and engagement    Problem: RH Expression Communication Goal: LTG Patient will verbally express basic/complex needs(SLP) Description: LTG:  Patient will  verbally express basic/complex needs, wants or ideas with cues  (SLP) Flowsheets (Taken 06/29/2021 1627) LTG: Patient will verbally express basic/complex needs, wants or ideas (SLP): (downgraded due to poor progress and engagement) Moderate Assistance - Patient 50 - 74% Note: Downgrade due to poor progress and engagement    Problem: RH Attention Goal: LTG Patient will demonstrate this level of attention during functional activites (SLP) Description: LTG:  Patient will will demonstrate this level of attention during functional activites (SLP) Flowsheets (Taken 06/29/2021 1627) Patient will demonstrate during cognitive/linguistic activities the attention type of:  Focused  Sustained LTG: Patient will demonstrate this level of attention during cognitive/linguistic activities with assistance of (SLP): Maximal Assistance - Patient 25 - 49% Number of minutes patient will demonstrate attention during cognitive/linguistic activities: 2-3 minutes Note: Downgraded due to poor progress and limited engagement    Problem: RH Awareness Goal: LTG: Patient will demonstrate awareness during functional activites type of (SLP) Description: LTG: Patient will demonstrate awareness during functional activites type of (SLP) Flowsheets (Taken 06/29/2021 1627) LTG: Patient will demonstrate awareness during cognitive/linguistic activities with assistance of (SLP): Maximal Assistance - Patient 25 - 49% Note: Downgraded due to poor progress and limited engagement

## 2021-06-29 NOTE — Progress Notes (Signed)
Physical Therapy Session Note  Patient Details  Name: Olivia Shah MRN: [id number] Date of Birth: 24-Sep-1956  Today's Date: 06/29/2021 PT Individual Time: 2122-4825 + 1415-1530 PT Individual Time Calculation (min): 27 min  + 75 min  Short Term Goals: Week 2:  PT Short Term Goal 1 (Week 2): Pt will perform bed to chair transfer with LRAD and mod A consistently PT Short Term Goal 2 (Week 2): Pt will initiate gait with training with LRAD PT Short Term Goal 3 (Week 2): Pt will tolerate standing x 5 min with LRAD  Skilled Therapeutic Interventions/Progress Updates:     1st session: Pt supine in bed to start session, agreeable to therapy and shakes head 'no' to pain. Pt alert, oriented to self only. Responds "2026" to all orientation questions. She also reports she see's her sister's - Olivia Shah and Olivia Shah, and calls for them in her room although family not present. Assisted with donning pants at bed level with modA, completed via bridging technique with assist needed for bracing LE's and max cues for technique. Completes supine<>sit with minA via log rolling for trunk elevation only. Stand<>pivot transfer requiring modA from EOB to her w/c. Placed in front of side bd rail to work on sit<>stands and standing tolerance while pulling herself up to stand from bed rail. Completed x2 stands with rest breaks b/w efforts, standing tolerance 60-90 sec per stand. Cues for upright posture and to aovid resting forearms on bed rails. She also demonstrates a slight lean to the L while standing. Static standing balance with BUE support requiring CGA overall. She ended session seated in w/c with safety belt alarm on and all her needs within reach.   2nd session: Pt sitting in recliner at start of session - agreeable to therapy session. Family (niece and sister) at bedside who have reasonable questions regarding patient's current mobility status and barriers to progressions. Extensive education provided and lengthy  discussion held regarding these topics, as well as home safety prep and f/u therapies. Discussed likely need for w/c for primary means of mobility and also likely need for a ramp to enter home.   Pt completed stand<>pivot transfer with modA from recliner to w/c, with max cues for hand placement, assistance, sequencing, and technique. She was transported to main rehab hallway and provided a Ethelene Hal to work on Conservation officer, historic buildings. She ambulated 58ft + 58ft + 26ft with +2 modA with w/c follow for safety, cues throughout for upright posture, increasing bilateral step length/height, and taking "longer" steps. Worked on standing tolerance and posture at the hand rail where she required +2 modA to stand and able to stand for ~15-30 second intervals - attempted to work on static standing marching but pt unable to understand directions. Noted pt to be incontinent of bladder during PT - returned to her room and assisted back to bed via stand<>pivot modA +2 transfer. Required totalA for sit>supine for BLE and trunk management. Niece provided +2 assist for brief change and  perciare, completed at bed level. Remained supine in bed with all needs in reach, bed alarm on, family at bedside at completion of session.    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

## 2021-06-29 NOTE — Progress Notes (Signed)
PROGRESS NOTE   Subjective/Complaints: BP soft- d/ced Hydralazine She has no complaints this morning Eating better, sitting upright and appears comfortable.  Last received nausea medicine 2 days ago  ROS: denies pain, +visual deficits- improving, +constipation   Objective:   No results found. Recent Labs    06/29/21 0539  WBC 4.6  HGB 11.0*  HCT 33.1*  PLT 232   Recent Labs    06/29/21 0539  NA 137  K 2.9*  CL 105  CO2 21*  GLUCOSE 104*  BUN 6*  CREATININE 0.84  CALCIUM 8.5*     Intake/Output Summary (Last 24 hours) at 06/29/2021 1033 Last data filed at 06/28/2021 1800 Gross per 24 hour  Intake 356 ml  Output --  Net 356 ml        Physical Exam: Vital Signs Blood pressure (!) 102/56, pulse 67, temperature 98.8 F (37.1 C), temperature source Oral, resp. rate 14, height 5\' 2"  (1.575 m), weight 79.2 kg, SpO2 100 %. Gen: no distress, normal appearing, eating better HEENT: +cortical blindness Cardio: Reg rate Chest: normal effort, normal rate of breathing Abd: soft, distended Ext: no edema Psych: pleasant, normal affect Skin: intact Neurological:     Mental Status: She is alert. She is disoriented.     Comments: Alert and oriented x1 Motor: Limited due to cognition, however left side appears weaker than right  Unable to see 2 fingers in front of her face and could not see me on the left. Left inattention with delayed output.  She was able to follow occasional one step motor commands with tactile cues.   Impaired initiation  Psychiatric:        Mood and Affect: Affect is blunt.        Speech: Speech is delayed.        Behavior: Behavior is slowed.        Cognition and Memory: Cognition is impaired.    Assessment/Plan: 1. Functional deficits which require 3+ hours per day of interdisciplinary therapy in a comprehensive inpatient rehab setting. Physiatrist is providing close team supervision and  24 hour management of active medical problems listed below. Physiatrist and rehab team continue to assess barriers to discharge/monitor patient progress toward functional and medical goals  Care Tool:  Bathing    Body parts bathed by patient: Right arm, Left arm, Chest, Abdomen, Face   Body parts bathed by helper: Front perineal area, Buttocks, Right lower leg, Left lower leg, Left upper leg, Right upper leg     Bathing assist Assist Level: Maximal Assistance - Patient 24 - 49%     Upper Body Dressing/Undressing Upper body dressing   What is the patient wearing?: Pull over shirt    Upper body assist Assist Level: Total Assistance - Patient < 25%    Lower Body Dressing/Undressing Lower body dressing      What is the patient wearing?: Incontinence brief, Pants     Lower body assist Assist for lower body dressing: Total Assistance - Patient < 25%     Toileting Toileting    Toileting assist Assist for toileting: Total Assistance - Patient < 25%     Transfers Chair/bed transfer  Transfers  assist  Chair/bed transfer activity did not occur: Safety/medical concerns  Chair/bed transfer assist level: Maximal Assistance - Patient 25 - 49% (slideboard)     Locomotion Ambulation   Ambulation assist   Ambulation activity did not occur: Safety/medical concerns  Assist level: 2 helpers Assistive device: Walker-Eva Max distance: 20   Walk 10 feet activity   Assist  Walk 10 feet activity did not occur: Safety/medical concerns  Assist level: 2 helpers Assistive device: Walker-Eva   Walk 50 feet activity   Assist Walk 50 feet with 2 turns activity did not occur: Safety/medical concerns         Walk 150 feet activity   Assist Walk 150 feet activity did not occur: Safety/medical concerns         Walk 10 feet on uneven surface  activity   Assist Walk 10 feet on uneven surfaces activity did not occur: Safety/medical concerns          Wheelchair     Assist Is the patient using a wheelchair?: No             Wheelchair 50 feet with 2 turns activity    Assist            Wheelchair 150 feet activity     Assist          Blood pressure (!) 102/56, pulse 67, temperature 98.8 F (37.1 C), temperature source Oral, resp. rate 14, height 5\' 2"  (1.575 m), weight 79.2 kg, SpO2 100 %.  Medical Problem List and Plan: 1.  Deficits with mobility, swallowing, cognition secondary to seizures status post meningioma resection.             -patient may not shower             -ELOS/Goals: 14-17 days/Supervision/Min A             Continue CIR  Vitamin D level reviewed and normal 2.  Impaired mobility: Continue Lovenox             -antiplatelet therapy: N/A 3. Pain Management: PRN meds 4. Mood: LCSW to follow for evaluation and support.              -antipsychotic agents: N/A 5. Neuropsych: This patient is not capable of making decisions on her own behalf. 6. Skin/Wound Care: Routine pressure relief measures.  7. Fluids/Electrolytes/Nutrition: Monitor I/Os 8. HTN: See #17. Monitor BP TID--continue Losartan, hydralazine. Decrease amlodipine to 5mg              Monitor with increased mobility 9.T2DM: Hgb A1C-6.6 and well controlled on home regimen 1000 mg am/500 mg pm             --Will discontinue Lantus after pm dose. Resume metformin as will off steroids.              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 Contacted NSGY to discuss with niece and to advise [place] when repeat imaging is warranted- discussed this with family 34.  Hypokalemia: normalized, monitor weekly.  12. Dysphagia: continue SLP 13. Leukocytosis: normalized 14. Decreased appetite: continue megace to boost appetite. Improved 15. Suboptimal with magnesium: restart magnesium 250mg  HS 16. Vomiting  meds: requested pharamcy to switch as many meds to IV as she is throwing them up. KUB  reviewed and shows gas and scattered stool. Enema and Miralax ordered. Resolved.  17. Hypotension: d/c Hydralazine.  18. Hypokalemia: supplement 23meq BID today and repeat BMP tomorrow.     LOS: 13 days A FACE TO FACE EVALUATION WAS PERFORMED  Olivia Shah 06/29/2021, 10:33 AM

## 2021-06-29 NOTE — Progress Notes (Signed)
Speech Language Pathology Weekly Progress and Session Note  Patient Details  Name: Olivia Shah MRN: [id number] Date of Birth: 20-Nov-1955  Beginning of progress report period: June 22, 2021 End of progress report period: June 29, 2021  Today's Date: 06/29/2021 SLP Individual Time: 1115-1200 SLP Individual Time Calculation (min): 45 min  Short Term Goals: Week 2: SLP Short Term Goal 1 (Week 2): Pt will follow 1 step commands/basic problem solving tasks with max A verbal cues in 60% of opportunties. SLP Short Term Goal 1 - Progress (Week 2): Not met SLP Short Term Goal 2 (Week 2): Pt will demonstrate orientation x4 with supervision A verbal cues for visual aids. SLP Short Term Goal 2 - Progress (Week 2): Not met SLP Short Term Goal 3 (Week 2): Pt will demonstrate sustained attention in 3-5 minute intervals with max A verbal cues. SLP Short Term Goal 3 - Progress (Week 2): Not met SLP Short Term Goal 4 (Week 2): Pt will idenitify 2 cognitive and 2 phsyical acute deficits with max A multimodal cues. SLP Short Term Goal 4 - Progress (Week 2): Not met SLP Short Term Goal 5 (Week 2): Pt will consume dys 2 textures and thin liquids with minimal overt s/s aspiration. SLP Short Term Goal 5 - Progress (Week 2): Met    New Short Term Goals: Week 3: SLP Short Term Goal 1 (Week 3): STG=LTG due to short ELOS (expected d/c 10/12)  Weekly Progress Updates: Pt made very poor progress meeting 1 out 5 goals this reporting period. Pt's barriers are limited engagement, flat affect, reduced focused/sustained attention, poor initiation, poor carryover, and poor intellectual awareness. Pt has also demonstrated poor PO intake, suggest due to cognitive and behavioral deficits, pt demonstrates greater success increase PO intake with family versus. Pt is currently on dys 2 textures and thin liquid diet, with total-max A for feeding, however swallow function appears relatively WFL. SLP will consider  upgrading diet to increase PO intake, continue addressing cognitive deficits and initiate family education this reporting period. SLP downgraded goals to reflect poor progress and carryover. Pt would benefit from skilled ST services in order to maximize functional independence and reduce burden of care, requiring 24 hour supervision at discharge with continued skilled ST services.      Intensity: Minumum of 1-2 x/day, 30 to 90 minutes Frequency: 3 to 5 out of 7 days Duration/Length of Stay: 10/12 Treatment/Interventions: Cognitive remediation/compensation;Cueing hierarchy;Functional tasks;Patient/family education;Dysphagia/aspiration precaution training;Internal/external aids;Speech/Language facilitation   Daily Session  Skilled Therapeutic Interventions: Skilled ST services focused on cognitive skills. SLP order early lunch tray to assess PO intake, however tray did not arrive during session. Pt demonstrated very poor focused and sustained attention in today's session, self-distracting behaviors braiding hair, attempting to remove safety belt and tugging at shirt as if it was the safety belt when safety belt was removed out of reach. Pt was disorientated to to place, time and situation, but was able to recall place/situation only with max A verbal cues for written aids in room. Pt was able to complete basic familiar task, brushing teeth with mod A verbal cues to initiate task, however pt required total-max A to preform 1 step directions (placing small bowls in big bowl; cleaning up task) even after demonstration. Pt denied any acute physical or cognitive changes, despite biofeed back from presented tasks. Pt was left in room with call bell within reach and chair alarm set. SLP requested mitts be order via nurse for pt due to safety concerns.  SLP recommends to continue skilled services.    General    Pain Pain Assessment Pain Score: 0-No pain  Therapy/Group: Individual Therapy  Cindy Brindisi   [facility] 06/29/2021, 4:11 PM

## 2021-06-29 NOTE — Progress Notes (Signed)
Occupational Therapy Session Note  Patient Details  Name: Olivia Shah MRN: [id number] Date of Birth: 09/08/1956  Today's Date: 06/29/2021 OT Individual Time: 1015-1100 OT Individual Time Calculation (min): 45 min    Short Term Goals: Week 2:  OT Short Term Goal 1 (Week 2): Pt will complete UB dressing with min assist. OT Short Term Goal 2 (Week 2): Pt will complete LB dressing with min assist. OT Short Term Goal 3 (Week 2): Pt will complete stand pivot toilet transfer with min assist consecutively x 3  Skilled Therapeutic Interventions/Progress Updates:    Patient seated in w/c, she denies pain and requests to call her sister.  Agrees to participating in therapy session.  She is able to doff slipper socks with cues, max A to donn socks and shoes.  Sit to stand and short distance ambulation with RW to mat table with mod A of 2, attempts to sit prior to being in front of table surface and requires max cues to remain on her feet.  Unsupported sitting with CS, cues for upright posture, hand over hand cues for reaching and arm mobility activities, sit to stand varies from min to max A with increased time and tactile cues - she is able to maintain for 20-30 seconds with min A and max cues for attempts to reach/use one hand functionally.  Stand pivot transfer to w/c with RW max A +2, to recliner mod A +2, she remained seated in recliner at close of session, assisted with calling her sister but there was no answer and she did not leave a message, seat belt alarm set and call bell in hand.    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

## 2021-06-30 DIAGNOSIS — R5381 Other malaise: Secondary | ICD-10-CM | POA: Diagnosis not present

## 2021-06-30 LAB — BASIC METABOLIC PANEL
Anion gap: 10 (ref 5–15)
BUN: 9 mg/dL (ref 8–23)
CO2: 21 mmol/L — ABNORMAL LOW (ref 22–32)
Calcium: 8.6 mg/dL — ABNORMAL LOW (ref 8.9–10.3)
Chloride: 107 mmol/L (ref 98–111)
Creatinine, Ser: 0.82 mg/dL (ref 0.44–1.00)
GFR, Estimated: >60 mL/min (ref >60)
Glucose, Bld: 103 mg/dL — ABNORMAL HIGH (ref 70–99)
Potassium: 3.5 mmol/L (ref 3.5–5.1)
Sodium: 138 mmol/L (ref 135–145)

## 2021-06-30 NOTE — Progress Notes (Signed)
Physical Therapy Session Note  Patient Details  Name: Olivia Shah MRN: [id number] Date of Birth: May 12, 1956  Today's Date: 06/30/2021 PT Missed Time: 31 Minutes Missed Time Reason: Patient fatigue;Patient unwilling to participate  Short Term Goals: Week 2:  PT Short Term Goal 1 (Week 2): Pt will perform bed to chair transfer with LRAD and mod A consistently PT Short Term Goal 2 (Week 2): Pt will initiate gait with training with LRAD PT Short Term Goal 3 (Week 2): Pt will tolerate standing x 5 min with LRAD  Skilled Therapeutic Interventions/Progress Updates:     Pt received seated in WC asleep, slumped forward with head near knees. PT rouses pt but pt refuses therapy due to fatigue. PT offers to assist pt back to bed but pt requests to stay in [facility]. PT will follow up as appropriate.  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

## 2021-06-30 NOTE — Progress Notes (Signed)
Physical Therapy Session Note  Patient Details  Name: Olivia Shah MRN: [id number] Date of Birth: 1956-05-25  Today's Date: 06/30/2021 PT Individual Time: 1100-1155 PT Individual Time Calculation (min): 55 min   Short Term Goals: Week 2:  PT Short Term Goal 1 (Week 2): Pt will perform bed to chair transfer with LRAD and mod A consistently PT Short Term Goal 2 (Week 2): Pt will initiate gait with training with LRAD PT Short Term Goal 3 (Week 2): Pt will tolerate standing x 5 min with LRAD  Skilled Therapeutic Interventions/Progress Updates:     Pt seated in w/c at start of session, agreeable to PT tx without reports of pain. Transported to ortho rehab gym for time in her w/c. Placed in the standing frame and completed x2 stands at dependant level with cues for initiating and assisting to rise to standing. Once standing, she would standing with CGA with support from frame and was able to stand for 6 min + 7 min for each stand - while standing, worked on conversational distractions, task attenuation, sustained and focused attention, and orientation (pt oriented to self only). Noted some verbal perseveration during these conversations. Cues also needed in standing to maintain upright posture due to tendency to sink into flexed positioning. Next, continued to work on these tasks with BITS system in sitting position to reduce need for dual-taksing. She struggled with this as well including the maze test, simple number sequencing, and visual targets. She required max cues for initiation and sometimes needing hand-over-hand assist for guiding her hand to complete tasks. She returned to her room at end of session and completed session in w/c with safety belt alarm on and all her needs within reach.   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

## 2021-06-30 NOTE — Progress Notes (Signed)
Speech Language Pathology Daily Session Note  Patient Details  Name: Olivia Shah MRN: [id number] Date of Birth: Jun 01, 1956  Today's Date: 06/30/2021 SLP Individual Time: 0905-1000 SLP Individual Time Calculation (min): 55 min  Short Term Goals: Week 3: SLP Short Term Goal 1 (Week 3): STG=LTG due to short ELOS (expected d/c 10/12)  Skilled Therapeutic Interventions:Skilled ST services focused on swallow and cognitive skills. Pt was slightly more alert today and attentive at a basic level. Pt refused breakfast tray per NT report. Pt was agreeable to consume 1/4 a yogurt with max A encouragement and demonstrate ability to self-feed with cup placed in left hand and timed task of consuming x3 bites at a time. Pt was only orientated to person, after education with written aids pt demonstrated delayed recall of place/situation in 3-5 minute intervals. SLP facilitated sustained attention ins various simple tasks. Pt was able to continuously flip cards over up to 5 minutes with supervision A verbal cues, pt was able to name number on cards with mod A verbal cues for sustained attention and supervision A verbal cues for accuracy; however she was unable to maintain sustained attention in dual task, flipping card over and naming cards or flipping card over and naming highest card among two. Pt required max A verbal cues for accuracy naming face cards with no awareness of errors. Pt was able to name numbers on cards in a field of two, bring attention right of midline with max A fade to min A verbal cues to verbalize response. Pt was able to name highest number among two with max A verbal cues to initiate verbal response, with 70% accuracy for problem solving highest card and mod A verbal cues for sustained attention in 1-2 minute intervals. Pt did initiated verbal response x4 independently, asking "when is your baby due" (appropriate), "I got to find my glasses", "that's better" and greeting "thank you." Pt was  left in room with call bell within reach and chair alarm set. As SLP was leaving pt attempted to remove safety belt x2 even despite education each time staff entered. SLP recommends to continue skilled services.     Pain Pain Assessment Pain Score: 0-No pain  Therapy/Group: Individual Therapy  Jashan Cotten  [facility] 06/30/2021, 11:33 AM

## 2021-06-30 NOTE — Patient Care Conference (Signed)
Inpatient RehabilitationTeam Conference and Plan of Care Update Date: 06/30/2021   Time: 13:07 PM    Patient Name: Olivia Shah      Medical Record Number: [id number]  Date of Birth: 12/04/1955 Sex: Female         Room/Bed: 4M01C/4M01C-01 Payor Info: Payor: MEDICARE / Plan: MEDICARE PART A AND B / Product Type: *No Product type* /    Admit Date/Time:  06/16/2021  4:08 PM  Primary Diagnosis:  [facility] Problems: Principal Problem:   Debility    Expected Discharge Date: Expected Discharge Date: 07/08/21  Team Members Present: Physician leading conference: Dr. Leeroy Cha Social Worker Present: Ovidio Kin, LCSW Nurse Present: Dorien Chihuahua, RN PT Present: Ginnie Smart, PT OT Present: Willeen Cass, OT SLP Present: Charolett Bumpers, SLP     Current Status/Progress Goal Weekly Team Focus  Bowel/Bladder             Swallow/Nutrition/ Hydration   dys 2 textures and thin liquids, very limited intake. swallow function appears min-supervision when she does consume  Supervision A  education, encourage PO intake, adjsut textures to increase intake   ADL's   functional mobility still requires +2 mod/max A, mod A for ADL tasks; requires max A to remain in standing position; min to mod for sit to stand, HOH for table top activities; poor sequencing and problem solving  min A overall  functional mobility/ transfers, ADL retraining, postural alignment, activity tolerance   Mobility   minA bed mobility, modA stand<>pivot transfers, gait 186ft with RW min +2 for safety  grossly MinA  Task initiation, functional transfers, sustained/focused attention, functional strength, gait progressions as able.   Communication   response to questions but does not initate verbal output  downgrade 10/3 - mod A  education, increasing verbal output and expressing/wants and needs   Safety/Cognition/ Behavioral Observations  total-Max A  downgraded 10/3 max-mod A  education,  focused/sustained, 1 step commands, intellectual awareness, recall and orientation   Pain             Skin               Discharge Planning:  Niece to come in next week for education in prepartion  for discharge next week. Aware pt will require 24/7 care   Team Discussion: Nausea resolved and appetite improved however spitting out medications. Continue to note flat affect and impaired initiation. Progress limited by fatigue and pain.   Patient on target to meet rehab goals: Yes, currently min - mod assist for stand pivot but able to ambulate up to 10' with +2 assist using a rolling walker. Needs +2 assist to stand but upright position is difficult to maintain. Needs hand over hand assist for table top activities. Anticipate wheelchair level for discharge.  *See Care Plan and progress notes for long and short-term goals.   Revisions to Treatment Plan:  Working on sustained attention, orientation, initiation, problem solving and standing tolerance. Encourage niece and family participation with therapy sessions. Downgraded goals for discharge.   Teaching Needs: Safety, toileting, transfers, medications, secondary risk management, etc.  Current Barriers to Discharge: Decreased caregiver support, Home enviroment access/layout, and Incontinence  Possible Resolutions to Barriers: Timed toileting Family education scheduled with niece 07/06/21     Medical Summary Current Status: hypotension, spitting out medications, nausea has improved, appetite has improved    Barriers to Discharge Comments: hypotension, spitting out medications Possible Resolutions to [employer] Focus: medications were changed to IV, and now back  to oral, provided education to her regarding the importance of swallowing her medications, adjusted her anti-hypertensive regimen   Continued Need for Acute Rehabilitation Level of Care: The patient requires daily medical management by a physician with specialized  training in physical medicine and rehabilitation for the following reasons: Direction of a multidisciplinary physical rehabilitation program to maximize functional independence : Yes Medical management of patient stability for increased activity during participation in an intensive rehabilitation regime.: Yes Analysis of laboratory values and/or radiology reports with any subsequent need for medication adjustment and/or medical intervention. : Yes   I attest that I was present, lead the team conference, and concur with the assessment and plan of the team.   Dorien Chihuahua B 06/30/2021, 3:42 PM

## 2021-06-30 NOTE — Progress Notes (Signed)
Occupational Therapy Session Note  Patient Details  Name: Olivia Shah MRN: [id number] Date of Birth: [birth year]  Today's Date: 06/30/2021 OT Individual Time: 3545-6256 OT Individual Time Calculation (min): 55 min    Short Term Goals: Week 2:  OT Short Term Goal 1 (Week 2): Pt will complete UB dressing with min assist. OT Short Term Goal 2 (Week 2): Pt will complete LB dressing with min assist. OT Short Term Goal 3 (Week 2): Pt will complete stand pivot toilet transfer with min assist consecutively x 3  Skilled Therapeutic Interventions/Progress Updates:  Pt greeted supine in bed asleep but easily able to arouse and agreeable to OT session. Session focus on BADL reeducation and functional transfers. Pt required MOD A +2 for bed mobiilty to exit bed to pts L side. Pt able to sit EOB with CGA. Pt completed LB dressing from EOB with MAX A. Pt sit<>stand from EOB with MOD A +2 to pull pants up waist line, pt actively assisting with pulling pants up in standing. Pt completed stand pivot tranfser from EOB>w/c to pts R side with MODA +2 with hand held assist. Pt transported to sink for grooming tasks with pt able to reach forward with BUEs to adjust water and set- up warm wash cloth with MAX multimodal cues to sequence and initiate task, pt with difficulty following commands with OTA standing on pts L side d/t L inattention. Pt completed oral care and face washing. Pt transported to gym with total A. Worked on sit<>stands with with use of mirror for visual feedback, pt completed x2 sit<>stands with MODA +2. Pt required assist to shift hip anteriorly and elevate trunk into standing. Pt returned to room with total A where pt left seated in w/c with alarm belt activated and all needs within reach.   102/69 supine  EOB 109/83 after session 107/92  Therapy Documentation Precautions:  Precautions Precautions: Fall Precaution Comments: Cortical blindness, seizure preacutions, high fall  risk Restrictions Weight Bearing Restrictions: No Pain: pt reports no pain during session     Therapy/Group: Individual Therapy  Precious Haws 06/30/2021, 8:56 AM

## 2021-06-30 NOTE — Progress Notes (Signed)
Patient ID: Olivia Shah, female   DOB: 26-Dec-1955, 65 y.o.   MRN: [id number] Spoke with niece-Tiffany via telephone to discuss team conference plan pt will be wheelchair level and not an ambulator due to amount of care this takes, not safe with family member to ambulate her. Family education set up for Monday 10/10. Working on ramp and possibility of getting a stedy for home. Tiffany had questions regarding if pt is spitting out her seizure meds what is the plan. Will message MD regarding this

## 2021-06-30 NOTE — Progress Notes (Signed)
PROGRESS NOTE   Spitting out medicine Medications have been changed back to oral.  She denies complaints Denies any more nausea  QQP:YPPJKD pain, +visual deficits- improving, +constipation   Objective:   No results found. Recent Labs    06/29/21 0539  WBC 4.6  HGB 11.0*  HCT 33.1*  PLT 232   Recent Labs    06/29/21 0539 06/30/21 0503  NA 137 138  K 2.9* 3.5  CL 105 107  CO2 21* 21*  GLUCOSE 104* 103*  BUN 6* 9  CREATININE 0.84 0.82  CALCIUM 8.5* 8.6*     Intake/Output Summary (Last 24 hours) at 06/30/2021 0858 Last data filed at 06/29/2021 1302 Gross per 24 hour  Intake 100 ml  Output --  Net 100 ml        Physical Exam: Vital Signs Blood pressure 107/75, pulse 75, temperature 98.6 F (37 C), temperature source Oral, resp. rate 16, height 5\' 2"  (1.575 m), weight 79.2 kg, SpO2 99 %. Gen: no distress, normal appearing, eating better HEENT: +cortical blindness Cardio: Reg rate Chest: normal effort, normal rate of breathing Abd: soft, distended Ext: no edema Psych: pleasant, normal affect Skin: intact Neurological:     Mental Status: She is alert. She is disoriented. Unable to recall what she did with therapy    Comments: Alert and oriented x1 Motor: Limited due to cognition, however left side appears weaker than right  Unable to see 2 fingers in front of her face and could not see me on the left. Left inattention with delayed output.  She was able to follow occasional one step motor commands with tactile cues.   Impaired initiation  Psychiatric:        Mood and Affect: Affect is blunt.        Speech: Speech is delayed.        Behavior: Behavior is slowed.        Cognition and Memory: Cognition is impaired.    Assessment/Plan: 1. Functional deficits which require 3+ hours per day of interdisciplinary therapy in a comprehensive inpatient rehab setting. Physiatrist is providing close team  supervision and 24 hour management of active medical problems listed below. Physiatrist and rehab team continue to assess barriers to discharge/monitor patient progress toward functional and medical goals  Care Tool:  Bathing    Body parts bathed by patient: Right arm, Left arm, Chest, Abdomen, Face   Body parts bathed by helper: Front perineal area, Buttocks, Right lower leg, Left lower leg, Left upper leg, Right upper leg     Bathing assist Assist Level: Maximal Assistance - Patient 24 - 49%     Upper Body Dressing/Undressing Upper body dressing   What is the patient wearing?: Pull over shirt    Upper body assist Assist Level: Total Assistance - Patient < 25%    Lower Body Dressing/Undressing Lower body dressing      What is the patient wearing?: Incontinence brief, Pants     Lower body assist Assist for lower body dressing: Total Assistance - Patient < 25%     Toileting Toileting    Toileting assist Assist for toileting: Total Assistance - Patient < 25%  Transfers Chair/bed transfer  Transfers assist  Chair/bed transfer activity did not occur: Safety/medical concerns  Chair/bed transfer assist level: Maximal Assistance - Patient 25 - 49% (slideboard)     Locomotion Ambulation   Ambulation assist   Ambulation activity did not occur: Safety/medical concerns  Assist level: 2 helpers Assistive device: Walker-Eva Max distance: 20   Walk 10 feet activity   Assist  Walk 10 feet activity did not occur: Safety/medical concerns  Assist level: 2 helpers Assistive device: Walker-Eva   Walk 50 feet activity   Assist Walk 50 feet with 2 turns activity did not occur: Safety/medical concerns         Walk 150 feet activity   Assist Walk 150 feet activity did not occur: Safety/medical concerns         Walk 10 feet on uneven surface  activity   Assist Walk 10 feet on uneven surfaces activity did not occur: Safety/medical concerns          Wheelchair     Assist Is the patient using a wheelchair?: No             Wheelchair 50 feet with 2 turns activity    Assist            Wheelchair 150 feet activity     Assist          Blood pressure 107/75, pulse 75, temperature 98.6 F (37 C), temperature source Oral, resp. rate 16, height 5\' 2"  (1.575 m), weight 79.2 kg, SpO2 99 %.  Medical Problem List and Plan: 1.  Deficits with mobility, swallowing, cognition secondary to seizures status post meningioma resection.             -patient may not shower             -ELOS/Goals: 14-17 days/Supervision/Min A             Continue CIR  -Interdisciplinary Team Conference today    Vitamin D level reviewed and normal 2.  Impaired mobility: Continue Lovenox             -antiplatelet therapy: N/A 3. Pain Management: PRN meds 4. Mood: LCSW to follow for evaluation and support.              -antipsychotic agents: N/A 5. Neuropsych: This patient is not capable of making decisions on her own behalf. 6. Skin/Wound Care: Routine pressure relief measures.  7. Fluids/Electrolytes/Nutrition: Monitor I/Os 8. HTN: See #17. Monitor BP TID--continue Losartan, hydralazine. Decrease amlodipine to 5mg              Monitor with increased mobility 9.T2DM: Hgb A1C-6.6 and well controlled on home regimen 1000 mg am/500 mg pm             --Will discontinue Lantus after pm dose. Resume metformin as will off steroids.              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 Contacted NSGY to discuss with niece and to advise [place] when repeat imaging is warranted- discussed this with family 50.  Hypokalemia: normalized, monitor weekly.  12. Dysphagia: continue SLP. Discussed with her the importance of swallowing her medications to the best of her ability 8. Leukocytosis: normalized 14.  Decreased appetite: Improved, d/c megace 15. Suboptimal with magnesium:  continue magnesium 250mg  HS 16. Vomiting meds: requested pharamcy to switch as many meds to IV as she is throwing them up. KUB reviewed and shows gas and scattered stool. Enema and Miralax ordered. Resolved.  17. Hypotension: d/c Hydralazine and amlodipine.  18. Hypokalemia: resolved with supplementation.     LOS: 14 days A FACE TO FACE EVALUATION WAS PERFORMED  Clide Deutscher Angelly Spearing 06/30/2021, 8:58 AM

## 2021-07-01 DIAGNOSIS — R5381 Other malaise: Secondary | ICD-10-CM | POA: Diagnosis not present

## 2021-07-01 MED ORDER — LOSARTAN POTASSIUM 50 MG PO TABS
75.0000 mg | ORAL_TABLET | Freq: Every day | ORAL | Status: DC
  Filled 2021-07-01: qty 1

## 2021-07-01 NOTE — Progress Notes (Signed)
Speech Language Pathology Daily Session Note  Patient Details  Name: Olivia Shah MRN: [id number] Date of Birth: 1956/07/18  Today's Date: 07/01/2021 SLP Individual Time: 1005-1045 SLP Individual Time Calculation (min): 40 min  Short Term Goals: Week 3: SLP Short Term Goal 1 (Week 3): STG=LTG due to short ELOS (expected d/c 10/12)  Skilled Therapeutic Interventions:Skilled ST services focused on cognitive skills. Pt was alert and responsive to verbal greetings and demonstrated verbal exchanged x3. Pt was orientated to place and situation but required total A for time. SLP facilitated verbal output and attention in naming nouns on picture cards with 70% accuracy and naming verb at phrase level with 50% accuracy, requiring max A verbal cues to initiate response and no awareness of errors. Pt demonstrated 30 second sustained attention task in sorting cards by 2 colors with 70% ability to correct errors with max A verbal cues ("not that color.") Pt responded yes via head nods to changes in cognitive skills, however could not verbalize what had change and nor appeared to demonstrate awareness of acute deficits. Pt responded to yes/no questions pertaining to biographical information with 70% accuracy via head nods only. Pt then began to fall sleep and could not functionally keeps eyes open for more than 5 seconds at a time. Pt missed 20 minute of skilled ST services due to fatigue/ falling asleep. Pt was left in room with call bell within reach and bed/chair alarm set. SLP recommends to continue skilled services.     Pain Pain Assessment Pain Score: 0-No pain  Therapy/Group: Individual Therapy  Liliana Brentlinger  [facility] 07/01/2021, 4:06 PM

## 2021-07-01 NOTE — Progress Notes (Signed)
PROGRESS NOTE   On the phone this morning.  Tolerated OT session today Denies pain Mod-Max A for lower body dressing  XEN:MMHWKG pain, +visual deficits- improving, +constipation   Objective:   No results found. Recent Labs    06/29/21 0539  WBC 4.6  HGB 11.0*  HCT 33.1*  PLT 232   Recent Labs    06/29/21 0539 06/30/21 0503  NA 137 138  K 2.9* 3.5  CL 105 107  CO2 21* 21*  GLUCOSE 104* 103*  BUN 6* 9  CREATININE 0.84 0.82  CALCIUM 8.5* 8.6*     Intake/Output Summary (Last 24 hours) at 07/01/2021 1009 Last data filed at 07/01/2021 0904 Gross per 24 hour  Intake 140 ml  Output --  Net 140 ml        Physical Exam: Vital Signs Blood pressure 96/63, pulse 81, temperature 98.3 F (36.8 C), resp. rate 16, height 5\' 2"  (1.575 m), weight 79.2 kg, SpO2 99 %. Gen: no distress, normal appearing, eating better HEENT: +cortical blindness Cardio: Reg rate Chest: normal effort, normal rate of breathing Abd: soft, distended Ext: no edema Psych: pleasant, normal affect Skin: intact Neurological:     Mental Status: She is alert. She is disoriented. Unable to recall what she did with therapy    Comments: Alert and oriented x1 Motor: Limited due to cognition, however left side appears weaker than right  Unable to see 2 fingers in front of her face and could not see me on the left. Left inattention with delayed output.  She was able to follow occasional one step motor commands with tactile cues.   Impaired initiation  Psychiatric:        Mood and Affect: Affect is blunt but she has become more talkative and friendly    Speech: Speech is delayed.        Behavior: Behavior is slowed.        Cognition and Memory: Cognition is impaired.    Assessment/Plan: 1. Functional deficits which require 3+ hours per day of interdisciplinary therapy in a comprehensive inpatient rehab setting. Physiatrist is providing close  team supervision and 24 hour management of active medical problems listed below. Physiatrist and rehab team continue to assess barriers to discharge/monitor patient progress toward functional and medical goals  Care Tool:  Bathing    Body parts bathed by patient: Right arm, Left arm, Chest, Abdomen, Face   Body parts bathed by helper: Front perineal area, Buttocks, Right lower leg, Left lower leg, Left upper leg, Right upper leg     Bathing assist Assist Level: Maximal Assistance - Patient 24 - 49%     Upper Body Dressing/Undressing Upper body dressing   What is the patient wearing?: Pull over shirt    Upper body assist Assist Level: Set up assist    Lower Body Dressing/Undressing Lower body dressing      What is the patient wearing?: Pants, Incontinence brief     Lower body assist Assist for lower body dressing: Maximal Assistance - Patient 25 - 49%     Toileting Toileting    Toileting assist Assist for toileting: Total Assistance - Patient < 25%  Transfers Chair/bed transfer  Transfers assist  Chair/bed transfer activity did not occur: Safety/medical concerns  Chair/bed transfer assist level: 2 Helpers     Locomotion Ambulation   Ambulation assist   Ambulation activity did not occur: Safety/medical concerns  Assist level: 2 helpers Assistive device: Walker-Eva Max distance: 20   Walk 10 feet activity   Assist  Walk 10 feet activity did not occur: Safety/medical concerns  Assist level: 2 helpers Assistive device: Walker-Eva   Walk 50 feet activity   Assist Walk 50 feet with 2 turns activity did not occur: Safety/medical concerns         Walk 150 feet activity   Assist Walk 150 feet activity did not occur: Safety/medical concerns         Walk 10 feet on uneven surface  activity   Assist Walk 10 feet on uneven surfaces activity did not occur: Safety/medical concerns         Wheelchair     Assist Is the patient using  a wheelchair?: No             Wheelchair 50 feet with 2 turns activity    Assist            Wheelchair 150 feet activity     Assist          Blood pressure 96/63, pulse 81, temperature 98.3 F (36.8 C), resp. rate 16, height 5\' 2"  (1.575 m), weight 79.2 kg, SpO2 99 %.  Medical Problem List and Plan: 1.  Deficits with mobility, swallowing, cognition secondary to seizures status post meningioma resection.             -patient may not shower             -ELOS/Goals: 29 days/Supervision/Min A             Continue CIR   Vitamin D level reviewed and normal 2.  Impaired mobility: Continue Lovenox             -antiplatelet therapy: N/A 3. Pain Management: PRN meds 4. Mood: LCSW to follow for evaluation and support.              -antipsychotic agents: N/A 5. Neuropsych: This patient is not capable of making decisions on her own behalf. 6. Skin/Wound Care: Routine pressure relief measures.  7. Fluids/Electrolytes/Nutrition: Monitor I/Os 8. HTN: See #17. Monitor BP TID--continue Losartan, hydralazine. D/c amlodipine and decrease Cozaar to 75mg .              Monitor with increased mobility 9.T2DM: Hgb A1C-6.6 and well controlled on home regimen 1000 mg am/500 mg pm             --Will discontinue Lantus after pm dose. Resume metformin as will off steroids.              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 Contacted NSGY to discuss with niece and to advise [place] when repeat imaging is warranted- discussed this with family 76.  Hypokalemia: normalized with supplementation, monitor weekly. Decrease Cozaar to 75mg  12. Dysphagia: continue SLP. Discussed with her the importance of swallowing her medications to the best of her ability 34. Leukocytosis: normalized 14. Decreased appetite: Improved, d/c megace 15. Suboptimal with  magnesium: continue magnesium 250mg  HS 16. Vomiting  meds: requested pharamcy to switch as many meds to IV as she is throwing them up. KUB reviewed and shows gas and scattered stool. Enema and Miralax ordered. Resolved.  17. Hypotension: d/c Hydralazine and amlodipine. Decrease Cozaar to 75mg .  18. Disposition: HFU scheduled    LOS: 15 days A FACE TO FACE EVALUATION WAS PERFORMED  Karlei Waldo P Aiana Nordquist 07/01/2021, 10:09 AM

## 2021-07-01 NOTE — Plan of Care (Signed)
  Problem: RH Ambulation Goal: LTG Patient will ambulate in home environment (PT) Description: LTG: Patient will ambulate in home environment, # of feet with assistance (PT). Outcome: Not Applicable Note: Pt will not be a safe functional ambulator in home environment   Problem: RH Stairs Goal: LTG Patient will ambulate up and down stairs w/assist (PT) Description: LTG: Patient will ambulate up and down # of stairs with assistance (PT) Outcome: Not Applicable Note: Pt will not be safe to navigate stairs at DC - will require a ramp for entrance into home   Problem: RH Balance Goal: LTG Patient will maintain dynamic standing balance (PT) Description: LTG:  Patient will maintain dynamic standing balance with assistance during mobility activities (PT) Flowsheets (Taken 07/01/2021 0740) LTG: Pt will maintain dynamic standing balance during mobility activities with:: Moderate Assistance - Patient 50 - 74% Note: Downgraded due to slow progress   Problem: Sit to Stand Goal: LTG:  Patient will perform sit to stand with assistance level (PT) Description: LTG:  Patient will perform sit to stand with assistance level (PT) Flowsheets (Taken 07/01/2021 0740) LTG: PT will perform sit to stand in preparation for functional mobility with assistance level: Moderate Assistance - Patient 50 - 74% Note: Downgraded due to slow progress   Problem: RH Bed Mobility Goal: LTG Patient will perform bed mobility with assist (PT) Description: LTG: Patient will perform bed mobility with assistance, with/without cues (PT). Flowsheets (Taken 07/01/2021 0740) LTG: Pt will perform bed mobility with assistance level of: Moderate Assistance - Patient 50 - 74% Note: Downgraded due to slow progress   Problem: RH Bed to Chair Transfers Goal: LTG Patient will perform bed/chair transfers w/assist (PT) Description: LTG: Patient will perform bed to chair transfers with assistance (PT). Flowsheets (Taken 07/01/2021 0740) LTG: Pt  will perform Bed to Chair Transfers with assistance level: Moderate Assistance - Patient 50 - 74% Note: Downgraded due to slow progress   Problem: RH Car Transfers Goal: LTG Patient will perform car transfers with assist (PT) Description: LTG: Patient will perform car transfers with assistance (PT). Flowsheets (Taken 07/01/2021 0740) LTG: Pt will perform car transfers with assist:: Moderate Assistance - Patient 50 - 74% Note: Downgraded due to slow progress   Problem: RH Ambulation Goal: LTG Patient will ambulate in controlled environment (PT) Description: LTG: Patient will ambulate in a controlled environment, # of feet with assistance (PT). Flowsheets (Taken 07/01/2021 0740) LTG: Pt will ambulate in controlled environ  assist needed:: Moderate Assistance - Patient 50 - 74% LTG: Ambulation distance in controlled environment: 80ft Note: Downgraded due to slow progress

## 2021-07-01 NOTE — Progress Notes (Signed)
Occupational Therapy Session Note  Patient Details  Name: Olivia Shah MRN: [id number] Date of Birth: Dec 08, 1955  Today's Date: 07/01/2021 OT Individual Time: 0704-0800 OT Individual Time Calculation (min): 56 min    Short Term Goals: Week 1:  OT Short Term Goal 1 (Week 1): Patient will don UB clothing with Min A seated EOB. OT Short Term Goal 1 - Progress (Week 1): Progressing toward goal OT Short Term Goal 2 (Week 1): Patient will complet e1/3 parts of LB clothing at bed level with Mod A. OT Short Term Goal 2 - Progress (Week 1): Met OT Short Term Goal 3 (Week 1): Patient will complete sit to stand transfer with Max A and LRAD. OT Short Term Goal 3 - Progress (Week 1): Met Week 2:  OT Short Term Goal 1 (Week 2): Pt will complete UB dressing with min assist. OT Short Term Goal 2 (Week 2): Pt will complete LB dressing with min assist. OT Short Term Goal 3 (Week 2): Pt will complete stand pivot toilet transfer with min assist consecutively x 3  Skilled Therapeutic Interventions/Progress Updates:  Patient met lying supine in bed in agreement with OT treatment session. 0/10 pain reported at rest and with activity. Patient able to state name and DOB this date. Oriented to hospital. Patient able to correctly state day of week and with question/multichoice cues. Unable to correctly identify month or year. Patient able to scoot laterally in supine with Max multimodal cues for sequencing and Min A with use of chuck pad. Supine to EOB with Mod A and HOB flat with use of bed rail and increased time/effort. Seated EOB, patient able to doff night gown and don overhead shirt with set-up assist. Patient incontinent of bladder but seemingly unaware. Hygiene clothing management completed with patient standing in stedy and Max A and max mutlimodal cues initially for hand placement. Seated EOB patient able to thread BLE through pants. Heavy Mod A for sit to stand in stedy and Max A to hike pants over hips. In  perched position at sink patient able to wash face and brush teeth with Max A and max mutlimodal cues 2/2 decreased motor planning and inattention. Sit to stand from perched position in stedy with Mod A. Patient more verbal this a.m. making needs known for water several times. 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 Session concluded with patient seated in recliner with call bell within reach, belt alarm activated and all needs met. Gospel music playing on CD player.   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

## 2021-07-01 NOTE — Progress Notes (Signed)
Physical Therapy Weekly Progress Note  Patient Details  Name: Olivia Shah MRN: [id number] Date of Birth: 01/10/1956  Beginning of progress report period: June 24, 2021 End of progress report period: July 01, 2021  Today's Date: 07/01/2021 PT Individual Time: 1300-1410 PT Individual Time Calculation (min): 70 min   Patient has met 2 of 3 short term goals. Pt is making very slow progress this reporting period. Her performance fluctuates daily depending on fatigue, attention, and effort. She requires modA for bed mobility (can require maxA at times), mod to maxA for squat pivot transfers, modA for sit<>stand transfers, and has ambulated ~5-40f with +2 modA with EEthelene Hal She has been able to tolerate standing up to 7 minutes in a standing frame but without this, can only stand for ~60 seconds. Her barriers are limited engagement, flat affect, poor awareness, decreased attention, behavioral and cognitive deficits, body habitus, and lack of motivation. Will begin focusing on family education to prepare patient for upcoming DC next week.   Patient continues to demonstrate the following deficits muscle weakness, decreased cardiorespiratoy endurance, impaired timing and sequencing, unbalanced muscle activation, motor apraxia, decreased coordination, and decreased motor planning, decreased visual perceptual skills, decreased motor planning, decreased initiation, decreased attention, decreased awareness, decreased problem solving, decreased safety awareness, decreased memory, and delayed processing, and decreased sitting balance, decreased standing balance, decreased postural control, and decreased balance strategies and therefore will continue to benefit from skilled PT intervention to increase functional independence with mobility.  Patient not progressing toward long term goals.  See goal revision..  Plan of care revisions: See PT POC note for LTG revisions. Discharged stair and home  ambulation goals.   PT Short Term Goals Week 2:  PT Short Term Goal 1 (Week 2): Pt will perform bed to chair transfer with LRAD and mod A consistently PT Short Term Goal 1 - Progress (Week 2): Not met PT Short Term Goal 2 (Week 2): Pt will initiate gait with training with LRAD PT Short Term Goal 2 - Progress (Week 2): Met PT Short Term Goal 3 (Week 2): Pt will tolerate standing x 5 min with LRAD PT Short Term Goal 3 - Progress (Week 2): Met Week 3:  PT Short Term Goal 1 (Week 3): STG = LTG due to ELOS  Skilled Therapeutic Interventions/Progress Updates:     Pt seeping in recliner upon arrival - awakens to voice and is agreeable to therapy session. She denies pain and also fatigue despite being woken. Completes sliding board transfer from recliner to w/c, requires maxA with 2nd person on standby - barriers to slide board transfer include lack of initiation, difficulty comprehending instructions, and body habitus with soft tissue limiting ability to scoot across board. She was transported to main rehab gym and completed a stand<>pivot transfer with modA from w/c to mat table - max cues and tactile feedback for initiating and for facilitating stepping technique. At edge of mat, she can sit unsupported with SBA but sitting posture is very flexed with forward head and rounded shoulders - worked on tall sitting and upright postural awareness - limited carryover and understanding of purpose. At edge of mat, completed a variety of seated therapeutic activity including unweighted ball toss, shooting basketball to lowered goal, thoracic rotations with small 5lb med ball, forward/backward handoffs of 3lb med ball, upward reaches with small 3lb med ball. She also completed modified sit<>stands to match resistive clothespins to lowered basketball goal with minA for standing and balance (not reaching full standing,  would rely on LUE for balance on mat table during attempting stands). She continues to struggle with  significant cognitive deficits that impact her functional mobility capacity such as sustained/focused attention, comprehension of basic instructions, and decreased awareness of deficits. She was assisted back to her w/c via stand<>pivot with modA and then returned to room where she was assisted to bed in similar manner. Required maxA for sit>supine for BLE and trunk management. Remained semi=reclined in bed with all needs in reach, bed alarm on, pt made comfortable at conclusion of session.   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

## 2021-07-01 NOTE — Progress Notes (Signed)
Patient ID: Olivia Shah, female   DOB: 1956-09-16, 65 y.o.   MRN: [id number] Spoke with Tiffany who had questions regarding equipment and coverage. Made aware tub bench's are not covered either are sara stedy. She will get back with me regarding equipment want to order and preference regarding home health agency.

## 2021-07-02 DIAGNOSIS — R5381 Other malaise: Secondary | ICD-10-CM | POA: Diagnosis not present

## 2021-07-02 MED ORDER — LOSARTAN POTASSIUM 50 MG PO TABS
50.0000 mg | ORAL_TABLET | Freq: Every day | ORAL | Status: DC
  Administered 2021-07-02 – 2021-07-03 (×2): 50 mg via ORAL
  Filled 2021-07-02: qty 1

## 2021-07-02 NOTE — Progress Notes (Signed)
Occupational Therapy Session Note  Patient Details  Name: JURNEE NAKAYAMA MRN: [id number] Date of Birth: 1955/11/24  Today's Date: 07/02/2021 OT Individual Time: 1000-1100 OT Individual Time Calculation (min): 60 min    Short Term Goals: Week 2:  OT Short Term Goal 1 (Week 2): Pt will complete UB dressing with min assist. OT Short Term Goal 2 (Week 2): Pt will complete LB dressing with min assist. OT Short Term Goal 3 (Week 2): Pt will complete stand pivot toilet transfer with min assist consecutively x 3   Skilled Therapeutic Interventions/Progress Updates:    First session:  Pt sitting up in recliner, no c/o pain.  Agreeable to taking a shower during OT session.  Stand pivot recliner to w/c with min assist with pt attempting to sit prematurely.  Pt transported to bathroom and completed stand pivot to shower bench using grab bars with min assist.  Pt bathed UB and LB with step by step Vcs and CGA during standing for peri region.  Pt donned shirt overhead with min assist to orient pt front and back and open bottom of shirt to begin task.  Pt donned brief with max assist in standing and pants needing mod assist at sit<>stand level.  Stand pivot shower bench to w/c with CGA using grab bars.  Pt donned socks and shoes with significantly increased time and supervision for verbal cues to stay on task.  Discussed dc planning with pts niece over the phone with pt present.  Determined pts sister will be moving in with her and there 24/7.  Niece and other family members will also intermittently be present to assist pt as needed.  Also discussed DME needs and recommendations.  Call bell in reach, seat belt alarm on.  Second session: Pt sitting in w/c with head lowered.  Pt shaking her head and stating "Im alright" when therapist encouraged pt to participate in various activities/self care with OT.  Pt requesting to get in bed and go to sleep.  Pt completed sit to stand and ambulated 3 feet and pivot sit  with min assist without AD and using bed rail.  Pt requesting to put on pajama shirt.  Therapist encouraged pt to stay dressed due to still having another PT session scheduled.  Pt responded "Im gonna tell you this, I am staying in bed".  Pt self initiating doffing of current shirt therefore therapist setup pt with pajama shirt as requested.  Pt donned overhead with supervision to orient front/back of shirt.  Pt then doffed shoes with some difficulty needing assist to untie.  Sit to supine with CGA.  Therapist replaced standard shoe laces with elastic at end of session to increase independence donning/doffing footwear.  Call bell in reach, bed alarm on.  Therapy Documentation Precautions:  Precautions Precautions: Fall Precaution Comments: Cortical blindness, seizure preacutions, high fall risk Restrictions Weight Bearing Restrictions: No   Therapy/Group: Individual Therapy  Ezekiel Slocumb 07/02/2021, 12:56 PM

## 2021-07-02 NOTE — Progress Notes (Signed)
PROGRESS NOTE   Asks this morning for help to call her sister She has no other complaints Appreciate Becky's help with DME No issues overnight  RKY:HCWCBJ pain, +visual deficits- improving, +constipation   Objective:   No results found. No results for input(s): WBC, HGB, HCT, PLT in the last 72 hours.  Recent Labs    06/30/21 0503  NA 138  K 3.5  CL 107  CO2 21*  GLUCOSE 103*  BUN 9  CREATININE 0.82  CALCIUM 8.6*     Intake/Output Summary (Last 24 hours) at 07/02/2021 0854 Last data filed at 07/01/2021 1725 Gross per 24 hour  Intake 260 ml  Output --  Net 260 ml        Physical Exam: Vital Signs Blood pressure 95/67, pulse 75, temperature 98.1 F (36.7 C), resp. rate 17, height 5\' 2"  (1.575 m), weight 79.2 kg, SpO2 97 %. Gen: no distress, normal appearing, eating better, fatigue during the day.  HEENT: +cortical blindness Cardio: Reg rate Chest: normal effort, normal rate of breathing Abd: soft, distended Ext: no edema Psych: pleasant, normal affect Skin: intact Neurological:     Mental Status: She is alert. She is disoriented. Unable to recall what she did with therapy    Comments: Alert and oriented x1 Motor: Limited due to cognition, however left side appears weaker than right  Unable to see 2 fingers in front of her face and could not see me on the left. Left inattention with delayed output.  She was able to follow occasional one step motor commands with tactile cues.   Impaired initiation  Psychiatric:        Mood and Affect: Affect is blunt but she has become more talkative and friendly    Speech: Speech is delayed.        Behavior: Behavior is slowed.        Cognition and Memory: Cognition is impaired.    Assessment/Plan: 1. Functional deficits which require 3+ hours per day of interdisciplinary therapy in a comprehensive inpatient rehab setting. Physiatrist is providing close team  supervision and 24 hour management of active medical problems listed below. Physiatrist and rehab team continue to assess barriers to discharge/monitor patient progress toward functional and medical goals  Care Tool:  Bathing    Body parts bathed by patient: Right arm, Left arm, Chest, Abdomen, Face   Body parts bathed by helper: Front perineal area, Buttocks, Right lower leg, Left lower leg, Left upper leg, Right upper leg     Bathing assist Assist Level: Maximal Assistance - Patient 24 - 49%     Upper Body Dressing/Undressing Upper body dressing   What is the patient wearing?: Pull over shirt    Upper body assist Assist Level: Set up assist    Lower Body Dressing/Undressing Lower body dressing      What is the patient wearing?: Pants, Incontinence brief     Lower body assist Assist for lower body dressing: Maximal Assistance - Patient 25 - 49%     Toileting Toileting    Toileting assist Assist for toileting: Total Assistance - Patient < 25%     Transfers Chair/bed transfer  Transfers assist  Chair/bed transfer activity did not occur: Safety/medical concerns  Chair/bed transfer assist level: 2 Helpers     Locomotion Ambulation   Ambulation assist   Ambulation activity did not occur: Safety/medical concerns  Assist level: 2 helpers Assistive device: Walker-Eva Max distance: 20   Walk 10 feet activity   Assist  Walk 10 feet activity did not occur: Safety/medical concerns  Assist level: 2 helpers Assistive device: Walker-Eva   Walk 50 feet activity   Assist Walk 50 feet with 2 turns activity did not occur: Safety/medical concerns         Walk 150 feet activity   Assist Walk 150 feet activity did not occur: Safety/medical concerns         Walk 10 feet on uneven surface  activity   Assist Walk 10 feet on uneven surfaces activity did not occur: Safety/medical concerns         Wheelchair     Assist Is the patient using a  wheelchair?: No             Wheelchair 50 feet with 2 turns activity    Assist            Wheelchair 150 feet activity     Assist          Blood pressure 95/67, pulse 75, temperature 98.1 F (36.7 C), resp. rate 17, height 5\' 2"  (1.575 m), weight 79.2 kg, SpO2 97 %.  Medical Problem List and Plan: 1.  Deficits with mobility, swallowing, cognition secondary to seizures status post meningioma resection.             -patient may not shower             -ELOS/Goals: 29 days/Supervision/Min A             Continue CIR   Vitamin D level reviewed and normal 2.  Impaired mobility: Continue Lovenox             -antiplatelet therapy: N/A 3. Pain Management: PRN meds 4. Mood: LCSW to follow for evaluation and support.              -antipsychotic agents: N/A 5. Neuropsych: This patient is not capable of making decisions on her own behalf. 6. Skin/Wound Care: Routine pressure relief measures.  7. Fluids/Electrolytes/Nutrition: Monitor I/Os 8. HTN: See #17. Monitor BP TID--continue Losartan, hydralazine. D/c amlodipine and decrease Cozaar to 75mg .              Monitor with increased mobility 9.T2DM: Hgb A1C-6.6 and well controlled on home regimen 1000 mg am/500 mg pm             --Will discontinue Lantus after pm dose. Resume metformin as will off steroids.              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 Contacted NSGY to discuss with niece and to advise [place] when repeat imaging is warranted- discussed this with family 37.  Hypokalemia: normalized with supplementation, monitor weekly. Decrease Cozaar to 75mg  12. Dysphagia: continue SLP. Discussed with her the importance of swallowing her medications to the best of her ability 67. Leukocytosis: normalized 14. Decreased appetite: Improved, d/c megace 15. Suboptimal with  magnesium: continue magnesium 250mg  HS 16. Vomiting meds: requested pharamcy to switch as many  meds to IV as she is throwing them up. KUB reviewed and shows gas and scattered stool. Enema and Miralax ordered. Resolved.  17. Hypotension: d/c Hydralazine and amlodipine. Decrease Cozaar to 50mg .  18. Fatigue: will discuss with neurology whether seizure medications can be decreased 19. Disposition: HFU scheduled.     LOS: 16 days A FACE TO FACE EVALUATION WAS PERFORMED  Olivia Shah 07/02/2021, 8:54 AM

## 2021-07-02 NOTE — Progress Notes (Signed)
Physical Therapy Session Note  Patient Details  Name: Olivia Shah MRN: [id number] Date of Birth: 1956/05/04  Today's Date: 07/02/2021 PT Individual Time: 1430-1525 PT Individual Time Calculation (min): 55 min   Short Term Goals: Week 3:  PT Short Term Goal 1 (Week 3): STG = LTG due to ELOS  Skilled Therapeutic Interventions/Progress Updates:     Pt supine in bed at start of session - required mod encouragement to participate in therapy as she requests to rest - convinced with effort. Supine<>sit completed at [facility] level, mostly due to poor initiation and effort. Completes stand<>pivot transfer with min/modA and no AD from EOB to w/c with cues for safety awareness, hand placement, and sequencing. Transported to main rehab gym for time. Focused majority of session on functional gait training, progressing gait distance, and RW management. She ambulated 73ft + 41ft + 42ft + 38ft with +2 minA and RW with w/c follow for safety. Assist needed for stability as well as RW management as she tends to push it too far forwards. Cues throughout for normalizing gait pattnern, keeping her head up, and safety awareness. Extended rest breaks needed b/w efforts. Returned to her room and assisted back to bed via stand<>pivot minA transfer with no AD. Completed sit>supine with minA for RLE and trunk management with HOB flat. Remained supine in bed with bed alarm on and all needs within reach at conclusion of session.   Therapy Documentation Precautions:  Precautions Precautions: Fall Precaution Comments: Cortical blindness, seizure preacutions, high fall risk Restrictions Weight Bearing Restrictions: No General:     Therapy/Group: Individual Therapy  Inga Noller P Varvara Legault 07/02/2021, 7:28 AM

## 2021-07-02 NOTE — Progress Notes (Signed)
Speech Language Pathology Daily Session Note  Patient Details  Name: Olivia Shah MRN: [id number] Date of Birth: Jan 22, 1956  Today's Date: 07/02/2021 SLP Individual Time: 4388-8757 SLP Individual Time Calculation (min): 45 min  Short Term Goals: Week 3: SLP Short Term Goal 1 (Week 3): STG=LTG due to short ELOS (expected d/c 10/12)  Skilled Therapeutic Interventions:   Patient seen for skilled ST session to address cognitive function goals. When SLP in room, patient sitting up in bed and had call bell/TV remote up to ear and seemed to be talking to someone. SLP then observed phone which was lying in bed next to her, was on and when picked up, family member on other line. Patient did not seem aware that this was occurring. She then started moving in bed and when asked, she stated she wanted to get out of bed and into chair. She did not independently push call button, but when SLP placed in front of her and cued her to push button to call nurse, she was able to do so, and was able to then verbalize her needs to nursing staff. When sitting up in recliner, patient participated in tasks of reading (1-2 sentences, large print) and locating music cd she wanted for later. Initially, she required modA verbal and gestural cues to scan in left visual field, however this improved to min-modA overall. Patient was not oriented to month (stated October). She was left in recliner with alarm belt in place and nursing in room. She continues to benefit from skilled SLP intervention to maximize cognitive-linguistic and swallow function goals prior to discharge.  Pain Pain Assessment Pain Scale: 0-10 Pain Score: 0-No pain  Therapy/Group: Individual Therapy  Sonia Baller, MA, CCC-SLP Speech Therapy

## 2021-07-03 DIAGNOSIS — R5381 Other malaise: Secondary | ICD-10-CM | POA: Diagnosis not present

## 2021-07-03 LAB — GLUCOSE, CAPILLARY: Glucose-Capillary: 84 mg/dL (ref 70–99)

## 2021-07-03 MED ORDER — LOSARTAN POTASSIUM 25 MG PO TABS
25.0000 mg | ORAL_TABLET | Freq: Every day | ORAL | Status: DC
  Administered 2021-07-04 – 2021-07-06 (×3): 25 mg via ORAL
  Filled 2021-07-03 (×5): qty 1

## 2021-07-03 MED ORDER — LEVETIRACETAM 100 MG/ML PO SOLN
1500.0000 mg | Freq: Two times a day (BID) | ORAL | Status: DC
  Administered 2021-07-03 – 2021-07-07 (×8): 1500 mg via ORAL
  Filled 2021-07-03 (×9): qty 15

## 2021-07-03 NOTE — Progress Notes (Signed)
PROGRESS NOTE   Asks again for help to call her sister She has no other concerns/complaints Denies pain Participating well with therapy  ROS: denies pain, +visual deficits- improving, +constipation   Objective:   No results found. No results for input(s): WBC, HGB, HCT, PLT in the last 72 hours.  No results for input(s): NA, K, CL, CO2, GLUCOSE, BUN, CREATININE, CALCIUM in the last 72 hours.    Intake/Output Summary (Last 24 hours) at 07/03/2021 1115 Last data filed at 07/03/2021 0751 Gross per 24 hour  Intake 120 ml  Output --  Net 120 ml        Physical Exam: Vital Signs Blood pressure (!) 81/60, pulse 85, temperature 98.2 F (36.8 C), temperature source Oral, resp. rate 16, height 5\' 2"  (1.575 m), weight 79.2 kg, SpO2 99 %. Gen: no distress, normal appearing, eating better, fatigue during the day.  HEENT: +cortical blindness, impaired left visual field scanning Cardio: Reg rate Chest: normal effort, normal rate of breathing Abd: soft, distended Ext: no edema Psych: pleasant, normal affect Skin: intact Neurological:     Mental Status: She is alert. She is disoriented. Unable to recall what she did with therapy    Comments: Alert and oriented x1 Motor: Limited due to cognition, however left side appears weaker than right  Unable to see 2 fingers in front of her face and could not see me on the left. Left inattention with delayed output.  She was able to follow occasional one step motor commands with tactile cues.   Impaired initiation  Psychiatric:        Mood and Affect: Affect is blunt but she has become more talkative and friendly    Speech: Speech is delayed.        Behavior: Behavior is slowed.        Cognition and Memory: Cognition is impaired.    Assessment/Plan: 1. Functional deficits which require 3+ hours per day of interdisciplinary therapy in a comprehensive inpatient rehab  setting. Physiatrist is providing close team supervision and 24 hour management of active medical problems listed below. Physiatrist and rehab team continue to assess barriers to discharge/monitor patient progress toward functional and medical goals  Care Tool:  Bathing    Body parts bathed by patient: Right arm, Left arm, Chest, Abdomen, Face   Body parts bathed by helper: Front perineal area, Buttocks, Right lower leg, Left lower leg, Left upper leg, Right upper leg     Bathing assist Assist Level: Maximal Assistance - Patient 24 - 49%     Upper Body Dressing/Undressing Upper body dressing   What is the patient wearing?: Pull over shirt    Upper body assist Assist Level: Set up assist    Lower Body Dressing/Undressing Lower body dressing      What is the patient wearing?: Pants, Incontinence brief     Lower body assist Assist for lower body dressing: Maximal Assistance - Patient 25 - 49%     Toileting Toileting    Toileting assist Assist for toileting: Total Assistance - Patient < 25%     Transfers Chair/bed transfer  Transfers assist  Chair/bed transfer activity did not occur: Safety/medical concerns  Chair/bed transfer assist level: Moderate Assistance - Patient 50 - 74%     Locomotion Ambulation   Ambulation assist   Ambulation activity did not occur: Safety/medical concerns  Assist level: 2 helpers Assistive device: Walker-rolling Max distance: 32ft   Walk 10 feet activity   Assist  Walk 10 feet activity did not occur: Safety/medical concerns  Assist level: 2 helpers Assistive device: Walker-rolling   Walk 50 feet activity   Assist Walk 50 feet with 2 turns activity did not occur: Safety/medical concerns         Walk 150 feet activity   Assist Walk 150 feet activity did not occur: Safety/medical concerns         Walk 10 feet on uneven surface  activity   Assist Walk 10 feet on uneven surfaces activity did not occur:  Safety/medical concerns         Wheelchair     Assist Is the patient using a wheelchair?: No             Wheelchair 50 feet with 2 turns activity    Assist            Wheelchair 150 feet activity     Assist          Blood pressure (!) 81/60, pulse 85, temperature 98.2 F (36.8 C), temperature source Oral, resp. rate 16, height 5\' 2"  (1.575 m), weight 79.2 kg, SpO2 99 %.  Medical Problem List and Plan: 1.  Deficits with mobility, swallowing, cognition secondary to seizures status post meningioma resection.             -patient may not shower             -ELOS/Goals: 29 days/Supervision/Min A             Continue CIR   Vitamin D level reviewed and normal 2.  Impaired mobility: Continue Lovenox             -antiplatelet therapy: N/A 3. Pain Management: PRN meds 4. Mood: LCSW to follow for evaluation and support.              -antipsychotic agents: N/A 5. Neuropsych: This patient is not capable of making decisions on her own behalf. 6. Skin/Wound Care: Routine pressure relief measures.  7. Fluids/Electrolytes/Nutrition: Monitor I/Os 8. HTN: See #17. Monitor BP TID--continue Losartan, hydralazine. D/c amlodipine and decrease Cozaar to 25mg .              Monitor with increased mobility 9.T2DM: Hgb A1C-6.6 and well controlled on home regimen 1000 mg am/500 mg pm             --Will discontinue Lantus after pm dose. Resume metformin as will off steroids.              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 Contacted NSGY to discuss with niece and to advise [place] when repeat imaging is warranted- discussed this with family 59.  Hypokalemia: normalized with supplementation, monitor weekly. Decrease Cozaar to 25mg  12. Dysphagia: continue SLP. Discussed with her the importance of swallowing her medications to the best of  her ability 20. Leukocytosis: normalized 14. Decreased appetite: Improved, d/c megace 15. Suboptimal with magnesium: continue magnesium 250mg  HS 16. Vomiting meds: requested pharamcy to switch as many  meds to IV as she is throwing them up. KUB reviewed and shows gas and scattered stool. Enema and Miralax ordered. Resolved.  17. Hypotension: d/c Hydralazine and amlodipine. Decrease Cozaar to 25mg .  18. Fatigue: will discuss with neurology whether seizure medications can be decreased 19. Disposition: HFU scheduled.     LOS: 17 days A FACE TO FACE EVALUATION WAS PERFORMED  Lemoine Goyne P Latona Krichbaum 07/03/2021, 11:15 AM

## 2021-07-03 NOTE — Progress Notes (Signed)
Physical Therapy Session Note  Patient Details  Name: Olivia Shah MRN: [id number] Date of Birth: 08-19-1956  Today's Date: 07/03/2021 PT Individual Time: 0800-0910 + 1415-1500 PT Individual Time Calculation (min): 70 min  + 45 min  Short Term Goals: Week 3:  PT Short Term Goal 1 (Week 3): STG = LTG due to ELOS  Skilled Therapeutic Interventions/Progress Updates:     1st session: Pt supine in bed to start session and is agreeable to PT session. Denies pain. Donned socks with totalA and sweat pants with maxA at bed level. Completed supine<>sit with modA, primarily for initiation and effort. Once sitting EOB, can sit with SBA. Donned her tennis shoes with totalA at EOB. Encouraged her for time toileting although she reports she didn't need to use the bathroom but was agreeable with encouragement. Sit<>Stand to RW with minA with cues needed for hand placement. Ambulates short distance in her room to toilet, ~48f, with minA for steadying and maxA for RW management as she has poor understanding of AD use with poor ability to correct despite instruction. Pt able to pull her pants down in standing with minA for balance and she was ultimately continent of bladder. Completed sit<>Stand to RW with minA from elevated BSC over toilet and completed ambulatory transfer to her w/c with min/modA and RW. Wheeled sinkside to complete hand hygiene which she did with setupA with cues needed for visual scanning to locate soap and paper towels. She was transported to day room rehab gym in w/c, for time. Setup in for seated Kinetron at workload to 30cm/sec - completed for 6 minutes with continuous cues for stepping pattern and motion. Then worked on fConservation officer, historic buildingswhere she ambulated ~342fwith minA and RW with +2 assist for w/c follow for safety- assist continues to primarily be needed for AD management and light steadying during gait. She was returned to her room at completion of session, remained seated in  w/c with safety belt alarm on, all her needs within reach.  *extra time needed throughout session due to delayed processing, delayed initiation, tangential stories, and decreased sustained attention.   2nd session: Pt seated in w/c at start of session with a family friend at the bedside. Pt required max convincing to participate, even with family friend providing encouragement. Able to encourage her to ambulate to the bathroom for timed toileting - she required minA for powering to rise to RW from w/c height and she ambulated ~1029fithin her room with minA for balance and maxA for RW management. Pt was continent of bladder once seated on toilet, despite saying she didn't need to go. Sit<>stand to RW with minA from elevated toilet height and then began ambulating to her w/c that was in her room - this time using +2 assist for RW management and steadying. After ~5ft3f ambulating, she began to report feeling hot and sweaty with increaesed efforts for stepping. Provided a chair and had a seated rest break, also provided cool wash cloth.   Assessed BP: Sitting: 107/82 HR 117 Standing: 122/95 HR 142 Supine in bed: 95/70 HR 101 *symptomatic  Assisted her back to bed with minA stand<>Pivot transfer with use of bed rail for support - required modA for sit>supine for BLE management. Remained supine in bed at completion of session with all needs met and bed alarm on, family friend at the bedside. Messaged RN at end of session regarding pt's response to mobility with concern for hypoglycemic event and relayed her vitals as  well. Pt missed 30 minutes of skilled therapy due to fatigue.   Therapy Documentation Precautions:  Precautions Precautions: Fall Precaution Comments: Cortical blindness, seizure preacutions, high fall risk Restrictions Weight Bearing Restrictions: No General: PT Amount of Missed Time (min): 30 Minutes PT Missed Treatment Reason: Patient fatigue  Therapy/Group: Individual  Therapy  Macey Wurtz P Marcelles Clinard PT 07/03/2021, 7:34 AM

## 2021-07-03 NOTE — Plan of Care (Signed)
  Problem: Consults Goal: RH STROKE PATIENT EDUCATION Description: See Patient Education module for education specifics  Outcome: Progressing   Problem: RH BOWEL ELIMINATION Goal: RH STG MANAGE BOWEL WITH ASSISTANCE Description: STG Manage Bowel with mod I Assistance. Outcome: Progressing Goal: RH STG MANAGE BOWEL W/MEDICATION W/ASSISTANCE Description: STG Manage Bowel with Medication with mod I  Assistance. Outcome: Progressing   Problem: RH BLADDER ELIMINATION Goal: RH STG MANAGE BLADDER WITH ASSISTANCE Description: STG Manage Bladder With mod I  Assistance Outcome: Progressing   Problem: RH SAFETY Goal: RH STG ADHERE TO SAFETY PRECAUTIONS W/ASSISTANCE/DEVICE Description: STG Adhere to Safety Precautions With cues Assistance/Device. Outcome: Progressing   Problem: RH PAIN MANAGEMENT Goal: RH STG PAIN MANAGED AT OR BELOW PT'S PAIN GOAL Description: At or below level 4 Outcome: Progressing   Problem: RH KNOWLEDGE DEFICIT Goal: RH STG INCREASE KNOWLEDGE OF DIABETES Description: Patient and sister will be able to manage DM with medications and dietary modifications using handouts and educational resources independently Outcome: Progressing Goal: RH STG INCREASE KNOWLEDGE OF HYPERTENSION Description: Patient and sister will be able to manage HTN with medications and dietary modifications using handouts and educational resources independently Outcome: Progressing Goal: RH STG INCREASE KNOWLEDGE OF DYSPHAGIA/FLUID INTAKE Description: Patient and sister will be able to manage Dysphagia,medications and dietary modifications using handouts and educational resources independently Outcome: Progressing Goal: RH STG INCREASE KNOWLEGDE OF HYPERLIPIDEMIA Description: Patient and sister will be able to manage HLD with medications and dietary modifications using handouts and educational resources independently Outcome: Progressing   Problem: RH Vision Goal: RH LTG Vision  (Specify) Outcome: Progressing   Problem: Consults Goal: RH BRAIN INJURY PATIENT EDUCATION Description: Description: See Patient Education module for eduction specifics Outcome: Progressing

## 2021-07-04 DIAGNOSIS — I10 Essential (primary) hypertension: Secondary | ICD-10-CM

## 2021-07-04 DIAGNOSIS — E1169 Type 2 diabetes mellitus with other specified complication: Secondary | ICD-10-CM | POA: Diagnosis not present

## 2021-07-04 DIAGNOSIS — H47619 Cortical blindness, unspecified side of brain: Secondary | ICD-10-CM

## 2021-07-04 DIAGNOSIS — R5381 Other malaise: Secondary | ICD-10-CM | POA: Diagnosis not present

## 2021-07-04 DIAGNOSIS — E669 Obesity, unspecified: Secondary | ICD-10-CM

## 2021-07-04 LAB — GLUCOSE, CAPILLARY
Glucose-Capillary: 118 mg/dL — ABNORMAL HIGH (ref 70–99)
Glucose-Capillary: 86 mg/dL (ref 70–99)

## 2021-07-04 NOTE — Progress Notes (Signed)
PROGRESS NOTE   Resting comfortably. Denied any problems this morning  ROS: limited d/t cognition   Objective:   No results found. No results for input(s): WBC, HGB, HCT, PLT in the last 72 hours.  No results for input(s): NA, K, CL, CO2, GLUCOSE, BUN, CREATININE, CALCIUM in the last 72 hours.    Intake/Output Summary (Last 24 hours) at 07/04/2021 0843 Last data filed at 07/03/2021 2200 Gross per 24 hour  Intake 240 ml  Output --  Net 240 ml        Physical Exam: Vital Signs Blood pressure 113/73, pulse (!) 102, temperature 98.1 F (36.7 C), temperature source Oral, resp. rate 18, height 5\' 2"  (1.575 m), weight 79.2 kg, SpO2 96 %. Constitutional: No distress . Vital signs reviewed. HEENT: NCAT,  oral membranes moist Neck: supple Cardiovascular: RRR without murmur. No JVD    Respiratory/Chest: CTA Bilaterally without wheezes or rales. Normal effort    GI/Abdomen: BS +, non-tender, non-distended Ext: no clubbing, cyanosis, or edema Psych: flat, slowed but cooperative  Skin: intact Neurological:     Mental Status: She is alert. She is disoriented.     oriented to self. Cortically blind Motor: Limited due to cognition, however left side appears weaker than right  Unable to see 2 fingers in front of her face and could not see me on the left. Left inattention with delayed output.  She was able to follow occasional one step motor commands with tactile cues.   Impaired initiation       Assessment/Plan: 1. Functional deficits which require 3+ hours per day of interdisciplinary therapy in a comprehensive inpatient rehab setting. Physiatrist is providing close team supervision and 24 hour management of active medical problems listed below. Physiatrist and rehab team continue to assess barriers to discharge/monitor patient progress toward functional and medical goals  Care Tool:  Bathing    Body parts bathed by  patient: Right arm, Left arm, Chest, Abdomen, Face   Body parts bathed by helper: Front perineal area, Buttocks, Right lower leg, Left lower leg, Left upper leg, Right upper leg     Bathing assist Assist Level: Maximal Assistance - Patient 24 - 49%     Upper Body Dressing/Undressing Upper body dressing   What is the patient wearing?: Pull over shirt    Upper body assist Assist Level: Set up assist    Lower Body Dressing/Undressing Lower body dressing      What is the patient wearing?: Pants, Incontinence brief     Lower body assist Assist for lower body dressing: Maximal Assistance - Patient 25 - 49%     Toileting Toileting    Toileting assist Assist for toileting: Total Assistance - Patient < 25%     Transfers Chair/bed transfer  Transfers assist  Chair/bed transfer activity did not occur: Safety/medical concerns  Chair/bed transfer assist level: Moderate Assistance - Patient 50 - 74%     Locomotion Ambulation   Ambulation assist   Ambulation activity did not occur: Safety/medical concerns  Assist level: 2 helpers Assistive device: Walker-rolling Max distance: 41ft   Walk 10 feet activity   Assist  Walk 10 feet activity did not occur: Safety/medical  concerns  Assist level: 2 helpers Assistive device: Walker-rolling   Walk 50 feet activity   Assist Walk 50 feet with 2 turns activity did not occur: Safety/medical concerns         Walk 150 feet activity   Assist Walk 150 feet activity did not occur: Safety/medical concerns         Walk 10 feet on uneven surface  activity   Assist Walk 10 feet on uneven surfaces activity did not occur: Safety/medical concerns         Wheelchair     Assist Is the patient using a wheelchair?: No             Wheelchair 50 feet with 2 turns activity    Assist            Wheelchair 150 feet activity     Assist          Blood pressure 113/73, pulse (!) 102, temperature  98.1 F (36.7 C), temperature source Oral, resp. rate 18, height 5\' 2"  (1.575 m), weight 79.2 kg, SpO2 96 %.  Medical Problem List and Plan: 1.  Deficits with mobility, swallowing, cognition secondary to seizures status post meningioma resection.             -patient may not shower             -ELOS/Goals: 29 days/Supervision/Min A             -Continue CIR therapies including PT, OT, and SLP   Vitamin D level reviewed and normal 2.  Impaired mobility: Continue Lovenox             -antiplatelet therapy: N/A 3. Pain Management: PRN meds 4. Mood: LCSW to follow for evaluation and support.              -antipsychotic agents: N/A 5. Neuropsych: This patient is not capable of making decisions on her own behalf. 6. Skin/Wound Care: Routine pressure relief measures.  7. Fluids/Electrolytes/Nutrition: Monitor I/Os 8. HTN: See #17. Monitor BP TID--continue Losartan, hydralazine. D/c amlodipine and decrease Cozaar to 25mg .            controlled 10/8 9.T2DM: Hgb A1C-6.6 and well controlled on home regimen 1000 mg am/500 mg pm             --Will discontinue Lantus after pm dose. Resume metformin as will off steroids.              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 Contacted NSGY to discuss with niece and to advise [place] when repeat imaging is warranted- discussed this with family 30.  Hypokalemia: normalized with supplementation, monitor weekly. Decrease Cozaar to 25mg  12. Dysphagia: continue SLP. Discussed with her the importance of swallowing her medications to the best of her ability 14. Leukocytosis: normalized 14. Decreased appetite: Improved, d/c'ed megace 15. Suboptimal with magnesium: continue magnesium 250mg  HS 16. Vomiting meds: requested pharamcy to switch as many meds to IV as she is throwing  them up. KUB reviewed and shows gas and scattered stool. Enema and Miralax ordered. Resolved.  17. Hypotension: d/c Hydralazine and amlodipine. Decrease Cozaar to 25mg .  18. Fatigue:   discuss with neurology whether seizure  medications can be decreased 19. Disposition: HFU scheduled.     LOS: 18 days A FACE TO [place] 07/04/2021, 8:43 AM

## 2021-07-04 NOTE — Plan of Care (Signed)
  Problem: Consults Goal: RH STROKE PATIENT EDUCATION Description: See Patient Education module for education specifics  Outcome: Progressing   Problem: RH BOWEL ELIMINATION Goal: RH STG MANAGE BOWEL WITH ASSISTANCE Description: STG Manage Bowel with mod I Assistance. Outcome: Progressing Goal: RH STG MANAGE BOWEL W/MEDICATION W/ASSISTANCE Description: STG Manage Bowel with Medication with mod I  Assistance. Outcome: Progressing   Problem: RH BLADDER ELIMINATION Goal: RH STG MANAGE BLADDER WITH ASSISTANCE Description: STG Manage Bladder With mod I  Assistance Outcome: Progressing   Problem: RH SAFETY Goal: RH STG ADHERE TO SAFETY PRECAUTIONS W/ASSISTANCE/DEVICE Description: STG Adhere to Safety Precautions With cues Assistance/Device. Outcome: Not Progressing   Problem: RH PAIN MANAGEMENT Goal: RH STG PAIN MANAGED AT OR BELOW PT'S PAIN GOAL Description: At or below level 4 Outcome: Progressing   Problem: RH KNOWLEDGE DEFICIT Goal: RH STG INCREASE KNOWLEDGE OF DIABETES Description: Patient and sister will be able to manage DM with medications and dietary modifications using handouts and educational resources independently Outcome: Progressing Goal: RH STG INCREASE KNOWLEDGE OF HYPERTENSION Description: Patient and sister will be able to manage HTN with medications and dietary modifications using handouts and educational resources independently Outcome: Progressing Goal: RH STG INCREASE KNOWLEDGE OF DYSPHAGIA/FLUID INTAKE Description: Patient and sister will be able to manage Dysphagia,medications and dietary modifications using handouts and educational resources independently Outcome: Progressing Goal: RH STG INCREASE KNOWLEGDE OF HYPERLIPIDEMIA Description: Patient and sister will be able to manage HLD with medications and dietary modifications using handouts and educational resources independently Outcome: Progressing   Problem: RH Vision Goal: RH LTG Vision  (Specify) Outcome: Progressing   Problem: Consults Goal: RH BRAIN INJURY PATIENT EDUCATION Description: Description: See Patient Education module for eduction specifics Outcome: Progressing

## 2021-07-05 LAB — GLUCOSE, CAPILLARY
Glucose-Capillary: 92 mg/dL (ref 70–99)
Glucose-Capillary: 119 mg/dL — ABNORMAL HIGH (ref 70–99)
Glucose-Capillary: 106 mg/dL — ABNORMAL HIGH (ref 70–99)
Glucose-Capillary: 102 mg/dL — ABNORMAL HIGH (ref 70–99)

## 2021-07-05 NOTE — Plan of Care (Signed)
  Problem: Consults Goal: RH STROKE PATIENT EDUCATION Description: See Patient Education module for education specifics  Outcome: Progressing   Problem: RH BOWEL ELIMINATION Goal: RH STG MANAGE BOWEL WITH ASSISTANCE Description: STG Manage Bowel with mod I Assistance. Outcome: Progressing Goal: RH STG MANAGE BOWEL W/MEDICATION W/ASSISTANCE Description: STG Manage Bowel with Medication with mod I  Assistance. Outcome: Progressing   Problem: RH BLADDER ELIMINATION Goal: RH STG MANAGE BLADDER WITH ASSISTANCE Description: STG Manage Bladder With mod I  Assistance Outcome: Progressing   Problem: RH SAFETY Goal: RH STG ADHERE TO SAFETY PRECAUTIONS W/ASSISTANCE/DEVICE Description: STG Adhere to Safety Precautions With cues Assistance/Device. Outcome: Not Progressing   Problem: RH PAIN MANAGEMENT Goal: RH STG PAIN MANAGED AT OR BELOW PT'S PAIN GOAL Description: At or below level 4 Outcome: Progressing   Problem: RH KNOWLEDGE DEFICIT Goal: RH STG INCREASE KNOWLEDGE OF DIABETES Description: Patient and sister will be able to manage DM with medications and dietary modifications using handouts and educational resources independently Outcome: Progressing Goal: RH STG INCREASE KNOWLEDGE OF HYPERTENSION Description: Patient and sister will be able to manage HTN with medications and dietary modifications using handouts and educational resources independently Outcome: Progressing Goal: RH STG INCREASE KNOWLEDGE OF DYSPHAGIA/FLUID INTAKE Description: Patient and sister will be able to manage Dysphagia,medications and dietary modifications using handouts and educational resources independently Outcome: Progressing Goal: RH STG INCREASE KNOWLEGDE OF HYPERLIPIDEMIA Description: Patient and sister will be able to manage HLD with medications and dietary modifications using handouts and educational resources independently Outcome: Progressing   Problem: RH Vision Goal: RH LTG Vision  (Specify) Outcome: Progressing   Problem: Consults Goal: RH BRAIN INJURY PATIENT EDUCATION Description: Description: See Patient Education module for eduction specifics Outcome: Progressing

## 2021-07-05 NOTE — Progress Notes (Signed)
Speech Language Pathology Daily Session Note  Patient Details  Name: Olivia Shah MRN: [id number] Date of Birth: 1956-06-28  Today's Date: 07/05/2021 SLP Individual Time: 1000-1030 SLP Individual Time Calculation (min): 30 min  Short Term Goals: Week 3: SLP Short Term Goal 1 (Week 3): STG=LTG due to short ELOS (expected d/c 10/12)  Skilled Therapeutic Interventions:   Pt was seen in room for skilled ST tx. SLP facilitated increased awareness by targeting orientation and attention. Pt was oriented to place ([place] and [place], [place]), situation (brain surgery), and self (name/DOB). Pt demonstrated adequate sustained attention throughout 30 min as indicated by asking and answering questions. Pt reports that she hopes to return to her favorite activities - sketching and writing poetry. Pt recited a poem at therapist request. SLP targeted attention through [facility] card activity. SLP requested pt verbalize number and color of single card provided. Pt benefited from sentence completion cue and extra time (~10 sec delay) between SLP asking for number and color of card. If delay was not present, pt would perseverate on the number when attempting to provide color. Pt was left in bed with bed alarm on and all immediate needs in reach. Cont with current POC.   Pain Pain Assessment Pain Scale: 0-10 Pain Score: 0-No pain  Therapy/Group: Individual Therapy  Verdene Lennert MS, CCC-SLP, CBIS  07/05/2021, 10:42 AM

## 2021-07-06 DIAGNOSIS — R5381 Other malaise: Secondary | ICD-10-CM | POA: Diagnosis not present

## 2021-07-06 LAB — BASIC METABOLIC PANEL
Anion gap: 8 (ref 5–15)
BUN: 9 mg/dL (ref 8–23)
CO2: 21 mmol/L — ABNORMAL LOW (ref 22–32)
Calcium: 9.1 mg/dL (ref 8.9–10.3)
Chloride: 110 mmol/L (ref 98–111)
Creatinine, Ser: 0.88 mg/dL (ref 0.44–1.00)
GFR, Estimated: >60 mL/min (ref >60)
Glucose, Bld: 107 mg/dL — ABNORMAL HIGH (ref 70–99)
Potassium: 4.3 mmol/L (ref 3.5–5.1)
Sodium: 139 mmol/L (ref 135–145)

## 2021-07-06 LAB — GLUCOSE, CAPILLARY
Glucose-Capillary: 104 mg/dL — ABNORMAL HIGH (ref 70–99)
Glucose-Capillary: 95 mg/dL (ref 70–99)
Glucose-Capillary: 113 mg/dL — ABNORMAL HIGH (ref 70–99)
Glucose-Capillary: 123 mg/dL — ABNORMAL HIGH (ref 70–99)
Glucose-Capillary: 107 mg/dL — ABNORMAL HIGH (ref 70–99)

## 2021-07-06 LAB — CBC
HCT: 36.7 % (ref 36.0–46.0)
Hemoglobin: 12.1 g/dL (ref 12.0–15.0)
MCH: 29.7 pg (ref 26.0–34.0)
MCHC: 33 g/dL (ref 30.0–36.0)
MCV: 90 fL (ref 80.0–100.0)
Platelets: 320 10*3/uL (ref 150–400)
RBC: 4.08 MIL/uL (ref 3.87–5.11)
RDW: 17.7 % — ABNORMAL HIGH (ref 11.5–15.5)
WBC: 4.5 10*3/uL (ref 4.0–10.5)
nRBC: 0 % (ref 0.0–0.2)

## 2021-07-06 MED ORDER — POTASSIUM CHLORIDE 20 MEQ PO PACK
40.0000 meq | PACK | Freq: Every day | ORAL | Status: DC
  Administered 2021-07-07 – 2021-07-08 (×2): 40 meq via ORAL
  Filled 2021-07-06 (×2): qty 2

## 2021-07-06 NOTE — Progress Notes (Signed)
Physical Therapy Session Note  Patient Details  Name: Olivia Shah MRN: [id number] Date of Birth: 05/31/1956  Today's Date: 07/06/2021 PT Individual Time: 6144-3154 + 1435-1530 PT Individual Time Calculation (min): 54 min + 55 min  Short Term Goals: Week 3:  PT Short Term Goal 1 (Week 3): STG = LTG due to ELOS  Skilled Therapeutic Interventions/Progress Updates:     1st session: Pt supine in bed to start session, awake and agreeable to therapy session. Denies pain but requesting to go home - reminded of DC date of 10/12 with family education/training this afternoon. Donned pants at bed level with modA for threading - difficulty pulling over hips in supine, so completed in sitting. Donned socks with totalA at bed level for time management.   Orthostatics taken: Supine:125/88 Sitting:133/92 Standing:140/110  Supine<>sit with HOB flat, no use of bed features, requiring minA for trunk elevation. Able to sit EOB with close supervision. Donned shoes with totalA at EOB. Sit<>stand to RW with minA for powering to rise and then ambulated to the bathroom for timed toileting with minA +2. Assist primarily needed for RW management and cueing for AD safety and keeping body within walker frame. Pt continent of bladder, despite reporting no need to void. Sit<>stand to RW with modA from toilet height and then ambulated short distances wtihin her room with +2 minA and RW, similar assist as above. Transported in her w/c to ortho rehab gym to practice car transfer. She required +2 minA for completing the transfer and then [place] for BLE management into/out of the car. Returned to her room in w/c while instructing to keep LE's out in extension to promote quad strengthening. Assisted to the recliner with minA stand<>pivot transfer without AD via face-to-face technique to reduce AD management. Safety belt alarm on and all her needs within reach at completion of session, BLE elevated.  *Extra time needed  throughout session due to poor sustained/focused attention, decreased initiation,   2nd session: Pt seated in recliner at start of session with multiple (5+) family members at the bedside. Handoff of care from OT as they concluded family training. Focus of session to continue family education and prepare for upcoming DC home with family support. Reviewed pt's current mobility, status, PT goals, DC planning, home safety, role of f/u therapies, DME rec's, assist levels and when they are needed, fall prevention, energy conservation, and reviewed her fluctuating mobility levels. All questions and concerns addressed from family. Reviewed stand<>pivot transfers with use of gait belt for safety - niece completed x3 during the session with appropriate guarding and safety - min cues for hand placement and keeping close to pt during the transfer. Pt transported to ortho rehab gym for time. Assisted into the car via stand<>pivot transfer with car height set to replicate her [place] - required minA for stand<>pivot transfer and then minA for BLE management into and out of the car. Family present for active observation. Next, reviewed short distance functional gait training in hallways - she ambulated a total of ~58ft with minA and RW with verbal cues for RW management and keeping her body within walker frame - assist primarily needed for steadying and RW management. She needed max verbal cues for postural awareness due to flexed trunk. Reminded family that pt is to only ambulate with therapies until further training completed. Family reports that a ramp is in process of being built - Verbally reviewed stair negotiation at w/c level, bumping up/down stairs with +2-4 assist. Family reports that they  are familiar with this due to prior family members being w/c users and they were able to verbalize sequencing and understanding for this technique. Educated on w/c parts such as leg rests, brakes, and anti-tippers. She ended session  seated in recliner with BLE elevated and safety belt alarm on - family at bedside with no further questions.   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

## 2021-07-06 NOTE — Progress Notes (Signed)
Patient ID: Olivia Shah, female   DOB: 1956-07-01, 65 y.o.   MRN: [id number]  Met with family members who were here for education it went well and they feel prepared for discharge on Wed. Tiffany-niece has chosen [facility] for her home health follow up likes the fact they work with pt's MD. Will make referral and work on discharge for Wed. Niece has heard from [employer] regarding bed delivery.

## 2021-07-06 NOTE — Progress Notes (Signed)
Speech Language Pathology Daily Session Note  Patient Details  Name: TIFFNY GEMMER MRN: [id number] Date of Birth: Dec 05, 1955  Today's Date: 07/06/2021 SLP Individual Time: 1302-1330 SLP Individual Time Calculation (min): 28 min  Short Term Goals: Week 3: SLP Short Term Goal 1 (Week 3): STG=LTG due to short ELOS (expected d/c 10/12)  Skilled Therapeutic Interventions:Skilled ST services focused on education. SLP facilitated  education with pt's 3 sister and 2 nieces, providing education on cognitive linguistic and swallow function. SLP noted primary deficits in cognition in intellectual awareness, as well as sustained attention and short term recall, impacting basic problem solving, safety awareness and PO intake. SLP educated to focus on nutrition and PO intake versus diet textures due to dysphagia being cognitive in nature. Education was provided to start with dys 2 and dys 3 textures, ensure pt is consuming appropriate amounts of food (limited PO intake noted during CIR stay), paying attention to bolus and clearing oral cavity. SLP recommends 24 hour supervision A and educated increase fall risk due to poor intellectual and safety awareness. Pt's family asked appropriate questions and all answered to satisfaction. Pt was left in room with call bell within reach and bed/chair alarm set. SLP recommends to continue skilled services.     Pain Pain Assessment Pain Score: 0-No pain  Therapy/Group: Individual Therapy  Seraphine Gudiel  [facility] 07/06/2021, 3:49 PM

## 2021-07-06 NOTE — Progress Notes (Signed)
Occupational Therapy Session Note  Patient Details  Name: Olivia Shah MRN: [id number] Date of Birth: 09-29-1955  Today's Date: 07/06/2021 OT Individual Time: 1335-1430 OT Individual Time Calculation (min): 55 min    Short Term Goals: Week 2:  OT Short Term Goal 1 (Week 2): Pt will complete UB dressing with min assist. OT Short Term Goal 2 (Week 2): Pt will complete LB dressing with min assist. OT Short Term Goal 3 (Week 2): Pt will complete stand pivot toilet transfer with min assist consecutively x 3   Skilled Therapeutic Interventions/Progress Updates:    Pt with no c/o pain, sitting up in recliner with blanket pulled to chin.  Multiple family members present for caregiver education session including niece and sister.  Educated family regarding pts performance fluctuation from day to day during self care and discussed what a "good" day looks like compared to a "hard" day and what type of assist needed with each one.  Pts family showed confidence and knowledge regarding bed level assistance for ADLs due to having taken care of other family members this way before.  Demonstrated how to assist pt in stand pivot and sit<>stand using gait belt and pt completed with min assist with therapist and then again with sister.  Pt donned jacket with family members assisting with min assist.  Educated family on benefits of providing simple cues and reducing external distractions and to allow extra time for processing.  Pt doffed shoes independently and shaking head no when encouraged to use toilet.  Educated family on benefits of timed toileting and encouraging pt instead.  Blocked practice sit<>stand and stand pivot assisting one another using gait belt since low participation by pt.  Direct hand off to PT.  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

## 2021-07-06 NOTE — Progress Notes (Signed)
PROGRESS NOTE   Olivia Shah has no complaints this morning Reached out to Dr. Hortense Ramal regarding decreasing seizure medications given fatigue, flat affect- appreciate her seeing her today  ROS: Limited d/t cognition   Objective:   No results found. Recent Labs    07/06/21 0646  WBC 4.5  HGB 12.1  HCT 36.7  PLT 320    Recent Labs    07/06/21 0646  NA 139  K 4.3  CL 110  CO2 21*  GLUCOSE 107*  BUN 9  CREATININE 0.88  CALCIUM 9.1      Intake/Output Summary (Last 24 hours) at 07/06/2021 1115 Last data filed at 07/06/2021 0900 Gross per 24 hour  Intake 240 ml  Output --  Net 240 ml        Physical Exam: Vital Signs Blood pressure 109/75, pulse 78, temperature 98.1 F (36.7 C), resp. rate 18, height 5\' 2"  (1.575 m), weight 79.2 kg, SpO2 99 %. Constitutional: No distress . Vital signs reviewed. HEENT: NCAT,  oral membranes moist Neck: supple Cardiovascular: RRR without murmur. No JVD    Respiratory/Chest: CTA Bilaterally without wheezes or rales. Normal effort    GI/Abdomen: BS +, non-tender, non-distended Ext: no clubbing, cyanosis, or edema Psych: flat, slowed but cooperative  Skin: intact Neurological:     Mental Status: She is alert. She is disoriented.     oriented to self. Cortically blind Motor: Limited due to cognition, however left side appears weaker than right  Ambulated with RW to sink with 2-person assist Unable to see 2 fingers in front of her face and could not see me on the left. Left inattention with delayed output.  She was able to follow occasional one step motor commands with tactile cues.   Impaired initiation       Assessment/Plan: 1. Functional deficits which require 3+ hours per day of interdisciplinary therapy in a comprehensive inpatient rehab setting. Physiatrist is providing close team supervision and 24 hour management of active medical problems listed  below. Physiatrist and rehab team continue to assess barriers to discharge/monitor patient progress toward functional and medical goals  Care Tool:  Bathing    Body parts bathed by patient: Right arm, Left arm, Chest, Abdomen, Face   Body parts bathed by helper: Front perineal area, Buttocks, Right lower leg, Left lower leg, Left upper leg, Right upper leg     Bathing assist Assist Level: Maximal Assistance - Patient 24 - 49%     Upper Body Dressing/Undressing Upper body dressing   What is the patient wearing?: Pull over shirt    Upper body assist Assist Level: Set up assist    Lower Body Dressing/Undressing Lower body dressing      What is the patient wearing?: Pants, Incontinence brief     Lower body assist Assist for lower body dressing: Maximal Assistance - Patient 25 - 49%     Toileting Toileting    Toileting assist Assist for toileting: Total Assistance - Patient < 25%     Transfers Chair/bed transfer  Transfers assist  Chair/bed transfer activity did not occur: Safety/medical concerns  Chair/bed transfer assist level: Moderate Assistance - Patient 50 - 74%  Locomotion Ambulation   Ambulation assist   Ambulation activity did not occur: Safety/medical concerns  Assist level: 2 helpers Assistive device: Walker-rolling Max distance: 87ft   Walk 10 feet activity   Assist  Walk 10 feet activity did not occur: Safety/medical concerns  Assist level: 2 helpers Assistive device: Walker-rolling   Walk 50 feet activity   Assist Walk 50 feet with 2 turns activity did not occur: Safety/medical concerns         Walk 150 feet activity   Assist Walk 150 feet activity did not occur: Safety/medical concerns         Walk 10 feet on uneven surface  activity   Assist Walk 10 feet on uneven surfaces activity did not occur: Safety/medical concerns         Wheelchair     Assist Is the patient using a wheelchair?: No              Wheelchair 50 feet with 2 turns activity    Assist            Wheelchair 150 feet activity     Assist          Blood pressure 109/75, pulse 78, temperature 98.1 F (36.7 C), resp. rate 18, height 5\' 2"  (1.575 m), weight 79.2 kg, SpO2 99 %.  Medical Problem List and Plan: 1.  Deficits with mobility, swallowing, cognition secondary to seizures status post meningioma resection.             -patient may not shower             -ELOS/Goals: 29 days/Supervision/Min A             -Continue CIR therapies including PT, OT, and SLP   Vitamin D level reviewed and normal 2.  Impaired mobility: Continue Lovenox             -antiplatelet therapy: N/A 3. Pain Management: PRN meds 4. Mood: LCSW to follow for evaluation and support.              -antipsychotic agents: N/A 5. Neuropsych: This patient is not capable of making decisions on her own behalf. 6. Skin/Wound Care: Routine pressure relief measures.  7. Fluids/Electrolytes/Nutrition: Monitor I/Os 8. HTN: See #17. Monitor BP TID--continue Losartan, hydralazine. D/c amlodipine and decrease Cozaar to 25mg .            controlled 10/8 9.T2DM: Hgb A1C-6.6 and well controlled on home regimen 1000 mg am/500 mg pm             --Will discontinue Lantus after pm dose. Resume metformin as will off steroids.              --Monitor BS ac/hs and d/c ISS.               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 Contacted NSGY to discuss with niece and to advise [place] when repeat imaging is warranted- discussed this with family 62.  Hypokalemia: normalized with supplementation, monitor weekly. Decrease Cozaar to 25mg . D/c potassium supplement 12. Dysphagia: continue SLP. Discussed with her the importance of swallowing her medications to the best of her ability 34. Leukocytosis: normalized 14. Decreased  appetite: Improved, d/c'ed megace 15. Suboptimal with magnesium: continue magnesium 250mg  HS 16. Vomiting meds: requested pharamcy to switch as many meds to  IV as she is throwing them up. KUB reviewed and shows gas and scattered stool. Enema and Miralax ordered. Resolved.  17. Hypotension: d/c Hydralazine and amlodipine. Decrease Cozaar to 25mg .  18. Fatigue:   discuss with neurology whether seizure medications can be decreased- Dr. Hortense Ramal to see patient today and plan to decrease Keppra.  19. Disposition: HFU scheduled.     LOS: 20 days A FACE TO FACE EVALUATION WAS PERFORMED  Laruth Hanger P Neriah Brott 07/06/2021, 11:15 AM

## 2021-07-06 NOTE — Plan of Care (Signed)
  Problem: Consults Goal: RH STROKE PATIENT EDUCATION Description: See Patient Education module for education specifics  Outcome: Progressing   Problem: RH BOWEL ELIMINATION Goal: RH STG MANAGE BOWEL WITH ASSISTANCE Description: STG Manage Bowel with mod I Assistance. Outcome: Progressing Goal: RH STG MANAGE BOWEL W/MEDICATION W/ASSISTANCE Description: STG Manage Bowel with Medication with mod I  Assistance. Outcome: Progressing   Problem: RH BLADDER ELIMINATION Goal: RH STG MANAGE BLADDER WITH ASSISTANCE Description: STG Manage Bladder With mod I  Assistance Outcome: Progressing   Problem: RH SAFETY Goal: RH STG ADHERE TO SAFETY PRECAUTIONS W/ASSISTANCE/DEVICE Description: STG Adhere to Safety Precautions With cues Assistance/Device. Outcome: Not Progressing   Problem: RH PAIN MANAGEMENT Goal: RH STG PAIN MANAGED AT OR BELOW PT'S PAIN GOAL Description: At or below level 4 Outcome: Progressing   Problem: RH KNOWLEDGE DEFICIT Goal: RH STG INCREASE KNOWLEDGE OF DIABETES Description: Patient and sister will be able to manage DM with medications and dietary modifications using handouts and educational resources independently Outcome: Progressing Goal: RH STG INCREASE KNOWLEDGE OF HYPERTENSION Description: Patient and sister will be able to manage HTN with medications and dietary modifications using handouts and educational resources independently Outcome: Progressing Goal: RH STG INCREASE KNOWLEDGE OF DYSPHAGIA/FLUID INTAKE Description: Patient and sister will be able to manage Dysphagia,medications and dietary modifications using handouts and educational resources independently Outcome: Progressing Goal: RH STG INCREASE KNOWLEGDE OF HYPERLIPIDEMIA Description: Patient and sister will be able to manage HLD with medications and dietary modifications using handouts and educational resources independently Outcome: Progressing   Problem: RH Vision Goal: RH LTG Vision  (Specify) Outcome: Progressing   Problem: Consults Goal: RH BRAIN INJURY PATIENT EDUCATION Description: Description: See Patient Education module for eduction specifics Outcome: Progressing

## 2021-07-07 ENCOUNTER — Other Ambulatory Visit (HOSPITAL_COMMUNITY): Payer: Self-pay

## 2021-07-07 DIAGNOSIS — R5381 Other malaise: Secondary | ICD-10-CM | POA: Diagnosis not present

## 2021-07-07 LAB — GLUCOSE, CAPILLARY
Glucose-Capillary: 93 mg/dL (ref 70–99)
Glucose-Capillary: 117 mg/dL — ABNORMAL HIGH (ref 70–99)
Glucose-Capillary: 92 mg/dL (ref 70–99)
Glucose-Capillary: 151 mg/dL — ABNORMAL HIGH (ref 70–99)

## 2021-07-07 MED ORDER — LOSARTAN POTASSIUM 25 MG PO TABS
12.5000 mg | ORAL_TABLET | Freq: Every day | ORAL | Status: DC
  Administered 2021-07-08: 12.5 mg via ORAL
  Filled 2021-07-07: qty 1

## 2021-07-07 MED ORDER — LEVETIRACETAM 100 MG/ML PO SOLN
1000.0000 mg | Freq: Two times a day (BID) | ORAL | Status: DC
  Administered 2021-07-07 – 2021-07-08 (×2): 1000 mg via ORAL
  Filled 2021-07-07 (×3): qty 10

## 2021-07-07 NOTE — Progress Notes (Signed)
Physical Therapy Session Note  Patient Details  Name: Olivia Shah MRN: [id number] Date of Birth: 06-28-1956  Today's Date: 07/07/2021 PT Individual Time: 1438-8875 PT Individual Time Calculation (min): 30 min   Short Term Goals: Week 3:  PT Short Term Goal 1 (Week 3): STG = LTG due to ELOS  Skilled Therapeutic Interventions/Progress Updates:    Patient received sitting up in wc, visitor present at bedside, agreeable to make up therapy time. She denies pain. PT transporting patient in wc to therapy gym for time management and energy conservation. Seated LE therex with 1.5# ankle weights 3x10: LAQ, marches. Patient with very slow and delayed initiation and required deliberate tactile cuing in order to switch legs after each set. PT attempting to have patient propel wc herself using B UE, however, patient just put wc brakes on then took them off repeatedly. PT attempted hand over hand assist, however patient would resume fidgeting with brakes. She was able to confirm for PT task at hand, but was unable to attempt to execute. Patient returning to room in wc, seatbelt alarm on, call light within reach, visitor at bedside.   Therapy Documentation Precautions:  Precautions Precautions: Fall Precaution Comments: Global weakness and deconditioning, poor attention and awareness Restrictions Weight Bearing Restrictions: No     Therapy/Group: Individual Therapy  Karoline Caldwell, PT, DPT, CBIS  07/07/2021, 2:04 PM

## 2021-07-07 NOTE — Discharge Summary (Signed)
Physical Therapy Discharge Summary  Patient Details  Name: Olivia Shah MRN: [id number] Date of Birth: [birth year]  Patient has met 7 of 7 long term goals due to improved activity tolerance, improved balance, improved postural control, increased strength, ability to compensate for deficits, functional use of  right lower extremity and left lower extremity, improved attention, and improved awareness.  Patient to discharge at a wheelchair level [place].   Patient's care partner is independent to provide the necessary physical and cognitive assistance at discharge. Family education has been completed prior to DC to prepare for safe transition home.   Reasons goals not met: n/a  Recommendation:  Patient will benefit from ongoing skilled PT services in home health setting to continue to advance safe functional mobility, address ongoing impairments in global weakness and deconditioning, functional transfers, home safety, caregiver training, gait training, and minimize fall risk.  Equipment: 20x18 w/c, hospital bed. Family purchased Denna Haggard (a generic brand) via self pay.   Reasons for discharge: treatment goals met and discharge from hospital  Patient/family agrees with progress made and goals achieved: Yes  PT Discharge Precautions/Restrictions Precautions Precautions: Fall Precaution Comments: Global weakness and deconditioning, poor attention and awareness Restrictions Weight Bearing Restrictions: No Vital Signs Therapy Vitals Temp: 99 F (37.2 C) Temp Source: Oral Pulse Rate: 89 Resp: 17 BP: 99/66 Patient Position (if appropriate): Lying Oxygen Therapy SpO2: 97 % O2 Device: Room Air Pain Pain Assessment Pain Scale: 0-10 Pain Score: 0-No pain Pain Interference Pain Interference Pain Effect on Sleep: 1. Rarely or not at all Pain Interference with Therapy Activities: 1. Rarely or not at all Pain Interference with Day-to-Day Activities: 1. Rarely or not at  all Vision/Perception  Vision - History Ability to See in Adequate Light: 1 Impaired Vision - Assessment Eye Alignment: Within Functional Limits Ocular Range of Motion: Restricted on the right Alignment/Gaze Preference: Chin down Tracking/Visual Pursuits: Decreased smoothness of horizontal tracking;Requires cues, head turns, or add eye shifts to track;Impaired - to be further tested in functional context Perception Perception: Impaired Inattention/Neglect: Does not attend to left visual field Praxis Praxis: Impaired Praxis Impairment Details: Motor planning;Initiation;Perseveration;Ideomotor  Cognition Overall Cognitive Status: Impaired/Different from baseline Arousal/Alertness: Awake/alert Orientation Level: Oriented to person;Oriented to place Year: 2026 Month: October Day of Week: Incorrect ("Saturday." Correct day is Tuesday) Attention: Focused;Sustained Focused Attention: Impaired Focused Attention Impairment: Verbal basic;Functional basic Sustained Attention: Impaired Sustained Attention Impairment: Verbal basic;Functional basic Memory: Impaired Memory Impairment: Decreased recall of new information;Decreased short term memory Decreased Short Term Memory: Verbal basic;Functional basic Immediate Memory Recall: Sock;Blue;Bed Memory Recall Sock: Without Cue Memory Recall Blue: Without Cue Memory Recall Bed: Without Cue Awareness: Impaired Awareness Impairment: Intellectual impairment Problem Solving: Impaired Problem Solving Impairment: Functional basic;Verbal basic Executive Function: Self Correcting;Self Monitoring;Organizing;Sequencing;Decision Making;Initiating Sequencing: Impaired Sequencing Impairment: Verbal basic;Functional basic Organizing: Impaired Organizing Impairment: Verbal basic;Functional basic Decision Making: Impaired Decision Making Impairment: Verbal basic;Functional basic Initiating: Impaired Initiating Impairment: Verbal basic Self Monitoring:  Impaired Self Monitoring Impairment: Verbal basic;Functional basic Self Correcting: Impaired Self Correcting Impairment: Functional basic Behaviors: Perseveration Safety/Judgment: Impaired Sensation Sensation Light Touch: Impaired by gross assessment Hot/Cold: Appears Intact Proprioception: Impaired by gross assessment Stereognosis: Not tested Coordination Gross Motor Movements are Fluid and Coordinated: No Fine Motor Movements are Fluid and Coordinated: No Finger Nose Finger Test: overshooting BUE Heel Shin Test: Limited due to hip weakness Motor  Motor Motor: Abnormal postural alignment and control;Hemiplegia Motor - Discharge Observations: Postural awareness improved since date of evaluation but continues to  be insufficient at times.  Mobility Bed Mobility Bed Mobility: Rolling Right;Rolling Left;Supine to Sit;Sit to Supine Rolling Right: Minimal Assistance - Patient > 75% Rolling Left: Minimal Assistance - Patient > 75% Supine to Sit: Minimal Assistance - Patient > 75% Sit to Supine: Minimal Assistance - Patient > 75% Transfers Transfers: Stand Pivot Transfers;Stand to Sit;Sit to Stand Sit to Stand: Minimal Assistance - Patient > 75% Stand to Sit: Minimal Assistance - Patient > 75% Stand Pivot Transfers: Minimal Assistance - Patient > 75% Stand Pivot Transfer Details: Tactile cues for sequencing;Tactile cues for posture;Tactile cues for weight shifting;Tactile cues for placement;Tactile cues for initiation;Verbal cues for sequencing;Verbal cues for precautions/safety;Verbal cues for gait pattern;Verbal cues for technique;Manual facilitation for weight shifting;Manual facilitation for placement;Manual facilitation for weight bearing Transfer (Assistive device): 1 person hand held assist (via face-to-face technique) Locomotion  Gait Ambulation: Yes Gait Assistance: Minimal Assistance - Patient > 75%;Moderate Assistance - Patient 50-74% Gait Distance (Feet): 35 Feet Assistive  device: Rolling walker Gait Assistance Details: Tactile cues for initiation;Tactile cues for sequencing;Tactile cues for weight shifting;Tactile cues for posture;Verbal cues for sequencing;Verbal cues for precautions/safety;Verbal cues for safe use of DME/AE;Verbal cues for gait pattern;Verbal cues for technique;Visual cues/gestures for sequencing;Visual cues for safe use of DME/AE;Visual cues/gestures for precautions/safety;Tactile cues for placement;Manual facilitation for weight shifting Gait Gait: Yes Gait Pattern: Impaired Gait Pattern: Step-to pattern;Decreased step length - right;Decreased step length - left;Decreased hip/knee flexion - right;Decreased hip/knee flexion - left;Narrow base of support;Trunk flexed;Poor foot clearance - left;Poor foot clearance - right Gait velocity: Significantly decreased Stairs / Additional Locomotion Stairs: No Wheelchair Mobility Wheelchair Mobility: Yes Wheelchair Assistance: Dependent - Patient 0% (pt lacks initiation and sustained attention to self propel) Wheelchair Propulsion: Both upper extremities Wheelchair Parts Management: Needs assistance  Trunk/Postural Assessment  Cervical Assessment Cervical Assessment: Exceptions to [facility] (significant forward head, especially in antigravity positions) Thoracic Assessment Thoracic Assessment: Exceptions to [facility] (rounded shoulders and kyphosis of thoracic spine) Lumbar Assessment Lumbar Assessment: Exceptions to [facility] (posterior pelvic tilt) Postural Control Postural Control: Deficits on evaluation Righting Reactions: Delayed Protective Responses: Delayed  Balance Balance Balance Assessed: Yes Static Sitting Balance Static Sitting - Balance Support: Feet supported;No upper extremity supported Static Sitting - Level of Assistance: 5: Stand by assistance Dynamic Sitting Balance Dynamic Sitting - Balance Support: Feet supported;During functional activity Dynamic Sitting - Level of Assistance: 4: Min  assist Static Standing Balance Static Standing - Balance Support: Bilateral upper extremity supported Static Standing - Level of Assistance: 4: Min assist Dynamic Standing Balance Dynamic Standing - Balance Support: During functional activity;Bilateral upper extremity supported Dynamic Standing - Level of Assistance: 3: Mod assist Extremity Assessment      RLE Assessment RLE Assessment: Exceptions to [facility] General Strength Comments: Ankle 4-/5, knee ext 4-/5, hip flex 3-/5 LLE Assessment LLE Assessment: Exceptions to [facility] General Strength Comments: Ankle 4-/5, knee ext 4-/5, hip flex 3-/5    Loui Massenburg P Keerthana Vanrossum PT, DPT 07/07/2021, 7:53 AM

## 2021-07-07 NOTE — Plan of Care (Signed)
Patient remains incontinent of bowel and bladder despite timed/prompted toileting

## 2021-07-07 NOTE — Progress Notes (Signed)
PROGRESS NOTE   Tolerating therapy well Plan for d/c tomorrow Appreciate neurology input regarding decreasing seizure medications  ROS: denies pain   Objective:   No results found. Recent Labs    07/06/21 0646  WBC 4.5  HGB 12.1  HCT 36.7  PLT 320    Recent Labs    07/06/21 0646  NA 139  K 4.3  CL 110  CO2 21*  GLUCOSE 107*  BUN 9  CREATININE 0.88  CALCIUM 9.1      Intake/Output Summary (Last 24 hours) at 07/07/2021 0950 Last data filed at 07/06/2021 1851 Gross per 24 hour  Intake 240 ml  Output --  Net 240 ml        Physical Exam: Vital Signs Blood pressure 94/77, pulse 89, temperature 99 F (37.2 C), temperature source Oral, resp. rate 17, height 5\' 2"  (1.575 m), weight 79.2 kg, SpO2 97 %. Gen: no distress, normal appearing HEENT: oral mucosa pink and moist, NCAT Cardio: Reg rate Chest: normal effort, normal rate of breathing Abd: soft, non-distended Ext: no edema Psych: pleasant, normal affect Skin: intact  Neurological:     Mental Status: She is alert. She is disoriented.     oriented to self. Cortically blind Motor: Limited due to cognition, however left side appears weaker than right  Ambulated with RW to sink with 2-person assist Unable to see 2 fingers in front of her face and could not see me on the left. Left inattention with delayed output.  She was able to follow occasional one step motor commands with tactile cues.   Impaired initiation       Assessment/Plan: 1. Functional deficits which require 3+ hours per day of interdisciplinary therapy in a comprehensive inpatient rehab setting. Physiatrist is providing close team supervision and 24 hour management of active medical problems listed below. Physiatrist and rehab team continue to assess barriers to discharge/monitor patient progress toward functional and medical goals  Care Tool:  Bathing    Body parts bathed by  patient: Right arm, Left arm, Chest, Abdomen, Face   Body parts bathed by helper: Front perineal area, Buttocks, Right lower leg, Left lower leg, Left upper leg, Right upper leg     Bathing assist Assist Level: Maximal Assistance - Patient 24 - 49%     Upper Body Dressing/Undressing Upper body dressing   What is the patient wearing?: Pull over shirt    Upper body assist Assist Level: Set up assist    Lower Body Dressing/Undressing Lower body dressing      What is the patient wearing?: Pants, Incontinence brief     Lower body assist Assist for lower body dressing: Maximal Assistance - Patient 25 - 49%     Toileting Toileting    Toileting assist Assist for toileting: Total Assistance - Patient < 25%     Transfers Chair/bed transfer  Transfers assist  Chair/bed transfer activity did not occur: Safety/medical concerns  Chair/bed transfer assist level: Moderate Assistance - Patient 50 - 74%     Locomotion Ambulation   Ambulation assist   Ambulation activity did not occur: Safety/medical concerns  Assist level: 2 helpers Assistive device: Walker-rolling Max distance: 51ft  Walk 10 feet activity   Assist  Walk 10 feet activity did not occur: Safety/medical concerns  Assist level: 2 helpers Assistive device: Walker-rolling   Walk 50 feet activity   Assist Walk 50 feet with 2 turns activity did not occur: Safety/medical concerns         Walk 150 feet activity   Assist Walk 150 feet activity did not occur: Safety/medical concerns         Walk 10 feet on uneven surface  activity   Assist Walk 10 feet on uneven surfaces activity did not occur: Safety/medical concerns         Wheelchair     Assist Is the patient using a wheelchair?: No             Wheelchair 50 feet with 2 turns activity    Assist            Wheelchair 150 feet activity     Assist          Blood pressure 94/77, pulse 89, temperature 99 F  (37.2 C), temperature source Oral, resp. rate 17, height 5\' 2"  (1.575 m), weight 79.2 kg, SpO2 97 %.  Medical Problem List and Plan: 1.  Deficits with mobility, swallowing, cognition secondary to seizures status post meningioma resection.             -patient may not shower             -ELOS/Goals: 29 days/Supervision/Min A             Continue CIR therapies including PT, OT, and SLP   Vitamin D level reviewed and normal 2.  Impaired mobility: Continue Lovenox             -antiplatelet therapy: N/A 3. Pain Management: PRN meds 4. Mood: LCSW to follow for evaluation and support.              -antipsychotic agents: N/A 5. Neuropsych: This patient is not capable of making decisions on her own behalf. 6. Skin/Wound Care: Routine pressure relief measures.  7. Fluids/Electrolytes/Nutrition: Monitor I/Os 8. HTN: See #17. Monitor BP TID--continue Losartan, hydralazine. D/c amlodipine and decrease Cozaar to 12.5mg .            controlled 10/11 9.T2DM: Hgb A1C-6.6 and well controlled on home regimen 1000 mg am/500 mg pm             --Will discontinue Lantus after pm dose. Continue metformin as will off steroids.              --Monitor BS ac/hs and d/c ISS.               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 Contacted NSGY to discuss with niece and to advise [place] when repeat imaging is warranted- discussed this with family 10.  Hypokalemia: normalized with supplementation, monitor weekly. Decrease Cozaar to 12.5mg . D/c potassium supplement 12. Dysphagia: continue SLP. Discussed with her the importance of swallowing her medications to the best of her ability 42. Leukocytosis: normalized 14. Decreased appetite: Improved, d/c'ed megace 15. Suboptimal with magnesium: continue magnesium 250mg  HS 16. Vomiting meds: requested pharamcy to switch as many meds to IV as  she is throwing them up. KUB reviewed and shows gas and scattered stool. Enema and Miralax ordered. Resolved.  17. Hypotension: d/c Hydralazine  and amlodipine. Decrease Cozaar to 25mg .  18. Fatigue:   discuss with neurology whether seizure medications can be decreased- appreciate Dr. Hortense Ramal decreasing [place] 19. Disposition: HFU scheduled.     LOS: 21 days A FACE TO FACE EVALUATION WAS PERFORMED  Martha Clan P Almadelia Looman 07/07/2021, 9:50 AM

## 2021-07-07 NOTE — TOC Benefit Eligibility Note (Signed)
Patient Teacher, English as a foreign language completed.    The patient is currently admitted and upon discharge could be taking lacosamide (Vimpat) 200 mg tablets.  The current 30 day co-pay is, $28.32.   The patient is insured through [place] [place] Patient Advocate Specialist [place] Team Direct Number: [phone]  Fax: [phone]

## 2021-07-07 NOTE — Progress Notes (Signed)
Patient ID: Olivia Shah, female   DOB: [birth year], 65 y.o.   MRN: [id number]  Met with pt to discuss team conference progress toward goals and readiness for discharge tomorrow. Family education went well and all feel ready for discharge. Niece reports bed being delivered today to home. Ready for discharge tomorrow.

## 2021-07-07 NOTE — Progress Notes (Signed)
Occupational Therapy Discharge Summary  Patient Details  Name: Olivia Shah MRN: [id number] Date of Birth: 05-24-1956  Today's Date: 07/07/2021 OT Individual Time: 0920-1000 OT Individual Time Calculation (min): 40 min    Patient has met 10 of 13 long term goals due to improved activity tolerance, improved balance, postural control, ability to compensate for deficits, improved attention, improved awareness, and improved coordination.  Patient to discharge at [facility] Assist level.  Patient's care partner is independent to provide the necessary physical and cognitive assistance at discharge.    Skilled Treatment: Pt semi reclined in bed no c/o pain.  Nurse present to give medications and when given option to work with OT and have nurse come back, pt replied "Im gonna get my meds now" therefore OT returned at nursing completion.  Pt asking to go to the bathroom.  CGA supine to sit with cues to utilize bed rail.  Min assist for all functional transfers including stand pivot EOB to w/c, stand pivot w/c<>commode, and sit<>stand during toileting and dressing tasks.  Pt also required min assist for pericare, clothing management after continent episode of stool and urine (documented in flowsheet) as well as doffing/donning shirt overhead, brief, pants, and shoes sitting/standing at commode.  Pt needed step by step simple Vcs for sequencing.  Pt sitting up in w/c, call bell in reach, seat alarm on at end of session.  Reasons goals not met: Pt did not meet setup goals due to continued cognitive impairments requiring supervision and multimodal cueing to complete basic self care.  Recommendation:  Patient will benefit from ongoing skilled OT services in home health setting to continue to advance functional skills in the area of BADL.  Equipment: W/C, hospital bed, bedside commode  Reasons for discharge: treatment goals met and discharge from hospital  Patient/family agrees with progress made and  goals achieved: Yes  OT Discharge Precautions/Restrictions   Fall General OT Amount of Missed Time: 20 Minutes Pain Pain Assessment Pain Scale: 0-10 Pain Score: 0-No pain ADL ADL Eating: Supervision/safety Where Assessed-Eating: Wheelchair Grooming: Supervision/safety Where Assessed-Grooming: Sitting at sink Upper Body Bathing: Minimal assistance Where Assessed-Upper Body Bathing: Sitting at sink, Shower Lower Body Bathing: Minimal assistance Where Assessed-Lower Body Bathing: Sitting at sink, Administrator, sports Dressing: Minimal assistance Where Assessed-Upper Body Dressing: Wheelchair Lower Body Dressing: Moderate assistance, Minimal assistance Where Assessed-Lower Body Dressing: Wheelchair Toileting: Minimal assistance Where Assessed-Toileting: Bedside Commode Toilet Transfer: Minimal assistance Toilet Transfer Method: Stand pivot Science writer: Geophysical data processor: Minimal assistance Social research officer, government Method: Radiographer, therapeutic: Gaffer Baseline Vision/History: 3 Glaucoma Patient Visual Report: Other (comment) (impaired communication impacted ability to describe visual deficits) Vision Assessment?: Yes Eye Alignment: Within Functional Limits Ocular Range of Motion: Restricted on the right Alignment/Gaze Preference: Chin down Tracking/Visual Pursuits: Decreased smoothness of horizontal tracking;Requires cues, head turns, or add eye shifts to track;Impaired - to be further tested in functional context Depth Perception: Overshoots Perception  Perception: Impaired Inattention/Neglect: Does not attend to left visual field Praxis Praxis: Impaired Praxis Impairment Details: Motor planning;Initiation;Perseveration;Ideomotor Cognition Overall Cognitive Status: Impaired/Different from baseline Arousal/Alertness: Awake/alert Orientation Level: Oriented to person;Oriented to place Year: 2026 Month:  October Day of Week: Incorrect Attention: Focused;Sustained Focused Attention: Impaired Focused Attention Impairment: Verbal basic;Functional basic Sustained Attention: Impaired Sustained Attention Impairment: Verbal basic;Functional basic Memory: Impaired Memory Impairment: Decreased recall of new information;Decreased short term memory Decreased Short Term Memory: Verbal basic;Functional basic Immediate Memory Recall: Sock;Blue;Bed Memory Recall  Sock: Without Cue Memory Recall Blue: Without Cue Memory Recall Bed: Without Cue Awareness: Impaired Awareness Impairment: Intellectual impairment Problem Solving: Impaired Problem Solving Impairment: Functional basic;Verbal basic Executive Function: Self Correcting;Self Monitoring;Organizing;Sequencing;Decision Making;Initiating Sequencing: Impaired Sequencing Impairment: Verbal basic;Functional basic Organizing: Impaired Organizing Impairment: Verbal basic;Functional basic Decision Making: Impaired Decision Making Impairment: Verbal basic;Functional basic Initiating: Impaired Initiating Impairment: Verbal basic Self Monitoring: Impaired Self Monitoring Impairment: Verbal basic;Functional basic Self Correcting: Impaired Self Correcting Impairment: Functional basic Behaviors: Perseveration Safety/Judgment: Impaired Sensation Sensation Light Touch: Impaired by gross assessment Hot/Cold: Appears Intact Proprioception: Impaired by gross assessment Stereognosis: Not tested Coordination Gross Motor Movements are Fluid and Coordinated: No Fine Motor Movements are Fluid and Coordinated: No Finger Nose Finger Test: overshooting BUE Heel Shin Test: Limited due to hip weakness Motor  Motor Motor - Discharge Observations: Postural awareness improved since date of evaluation but continues to be insufficient at times. Mobility  Transfers Sit to Stand: Minimal Assistance - Patient > 75% Stand to Sit: Minimal Assistance - Patient > 75%   Trunk/Postural Assessment  Cervical Assessment Cervical Assessment: Exceptions to [facility] (forward head posture) Thoracic Assessment Thoracic Assessment: Exceptions to [facility] (kyphosis) Lumbar Assessment Lumbar Assessment: Exceptions to [facility] (posterior pelvic tilt) Postural Control Postural Control: Deficits on evaluation Righting Reactions: Delayed Protective Responses: Delayed  Balance Balance Balance Assessed: Yes Static Sitting Balance Static Sitting - Balance Support: Feet supported;No upper extremity supported Static Sitting - Level of Assistance: 5: Stand by assistance Dynamic Sitting Balance Dynamic Sitting - Balance Support: Feet supported;During functional activity Dynamic Sitting - Level of Assistance: 5: Stand by assistance Static Standing Balance Static Standing - Balance Support: Bilateral upper extremity supported Static Standing - Level of Assistance: 4: Min assist Dynamic Standing Balance Dynamic Standing - Balance Support: During functional activity;Bilateral upper extremity supported Dynamic Standing - Level of Assistance: 3: Mod assist Extremity/Trunk Assessment RUE Assessment RUE Assessment: Exceptions to [facility] General Strength Comments: 4-/5 LUE Assessment LUE Assessment: Exceptions to [facility] General Strength Comments: 4-/5   Ezekiel Slocumb 07/07/2021, 3:09 PM

## 2021-07-07 NOTE — Progress Notes (Signed)
Physical Therapy Session Note  Patient Details  Name: Olivia Shah MRN: [id number] Date of Birth: July 27, 1956  Today's Date: 07/07/2021 PT Individual Time: 1030-1110 + 1430-1515 PT Individual Time Calculation (min): 40 min + 45 min  Short Term Goals: Week 3:  PT Short Term Goal 1 (Week 3): STG = LTG due to ELOS  Skilled Therapeutic Interventions/Progress Updates:     1st session: Pt seated in w/c to start - agreeable to PT tx but reports generalized fatigue and requires some convincing to participate. Transported in w/c to main rehab gym. Focused majority of session on functional gait training - completes sit<>stand to RW from w/c height with minA and required TC's for initiation. Ambulates 10f prior to fatigue with minA and RW with +2 assist for w/c follow - continues to struggle with RW proximity and safety awareness with AD management. Tied a green theraband around front of walker frame to allow visual cue for keeping body within walker frame - minimal improvement during 2nd gait trial although she was able to increase her distance to ~311fwith RW with similar assist as above. Next, introduced stair training - she was able to negotiate up/down x1 6inch step x4 times with 2 hand rails and modA for powering to rise. No knee buckling noted but forward flexed posture. Transported back to her room in w/c, remained seated with safety belt alarm on and needs within reach. Nephew arriving at end of session. All needs met.  *Extra time needed for rest breaks b/w functional mobility as well as for task initiation.  2nd session: Pt seated in w/c to start session - agreeable to therapy but reporting fatigue and requesting to return to bed at end of session. Denies any pain. Transported to main rehab hallway for time. Worked on functional gait training to start - ambulated [id number] minA and RW with +2 assist for w/c follow for safety - cues consistent for keeping body within walker frame, upward  gaze, and elongating her steps. Gait distance limited primarily by fatigue. Completed DC functional assessment (see DC summary) and discussed DC planning, home safety, role of f/u therapies, DME rec's, etc, throughout this. She was returned to her room and assisted back to bed via stand<>pivot transfer with minA and no AD. Required minA for BLE management to return to supine in the bed. Remained in bed with bed alarm on and visitors at the bedside. No further needs. She missed 15 minutes of skilled therapy due to fatigue.   Therapy Documentation Precautions:  Precautions Precautions: Fall Precaution Comments: Global weakness and deconditioning, poor attention and awareness Restrictions Weight Bearing Restrictions: No General: PT Amount of Missed Time (min): 15 Minutes PT Missed Treatment Reason: Patient fatigue  Therapy/Group: Individual Therapy  Mouhamad Teed P Jakaila Norment 07/07/2021, 11:10 AM

## 2021-07-07 NOTE — Progress Notes (Signed)
Speech Language Pathology Discharge Summary  Patient Details  Name: Olivia Shah MRN: [id number] Date of Birth: 11/11/1955  Today's Date: 07/07/2021 SLP Individual Time: 0802-0845 SLP Individual Time Calculation (min): 43 min   Skilled Therapeutic Interventions: Skilled ST services focused on swallow and cognitive skills. SLP facilitated PO intake of dys 3 trial textures, pt demonstrated incraese bolus preparation time due to reduced attention, however able to clear bolus and no overt s/s aspiration. Pt was limited in PO intake required max A verbal cues to consumed 3/4 of peach cup only, due to "not being hungry." SLP provided education pertaining to need to consume meals even when not hungry due to cognitive deficits. Pt was orientated to place and situation. Pt scored 9/30 (n=>27) on SLUMS indicating severe deficits in attention, basic problem solving, short term recall and intellectual awareness. Pt did demonstrated improvement in short term recall recalling 3/5 words with delay and 1 out 4 details from story on SLUMS. Pt demonstrated increased initiation of verbal output without cues x5 and increase pragmatic skills looking to the left to look SLP in eyes when communicating x1. SLP left handout in pt's stroke binder for family with detailed information on acute deficits and areas of need as well as SLUMS cognitive linguistic assessment results. Pt was left in room with call bell within reach and bed alarm set. SLP recommends to continue skilled services.    Patient has met 8 of 8 long term goals.  Patient to discharge at overall Max;Mod;Supervision level.  Reasons goals not met: N/A   Clinical Impression/Discharge Summary:   Pt made good progress meeting 8 out 8 goals, discharging at mod A for communication, max A cognition and supervision A for swallow function. Pt demonstrates slow progress and cognitive goals were downgraded during CIR length of stay due to slow progress and carryover.  Education was completed with family as well as handouts given. Pt is currently consuming dys 2 textures, however education was given to allow pt dys 3 textures and occasional regular textures to increase PO intake (due to cognition) and providing max-total A in feeding to encourage PO intake. Pt has made improvements in ability to express wants/needs at word/phrase level, follow 1 step commands, orientation to self/place/situation and increase focused/sustained attention. Pt's barrier at discharge continue to be intellectual awareness of acute deficits, sustained attention, basic problem solving, initiation, following 1-2 step commands, safety awareness, reduced PO intake and reduced ability to express wants/needs. Pt benefited from skilled ST services in order to maximize functional independence and reduce burden of care, requiring 24 hour supervision at discharge with continued skilled ST services.   Care Partner:  Caregiver Able to Provide Assistance: Yes  Type of Caregiver Assistance: Physical;Cognitive  Recommendation:  Home Health SLP;24 hour supervision/assistance  Rationale for SLP Follow Up: Maximize functional communication;Maximize swallowing safety;Maximize cognitive function and independence;Reduce caregiver burden   Equipment: N/A   Reasons for discharge: Discharged from hospital   Patient/Family Agrees with Progress Made and Goals Achieved: Yes    [name] 07/07/2021, 12:42 PM  

## 2021-07-07 NOTE — Progress Notes (Signed)
Inpatient Rehabilitation Care Coordinator Discharge Note   Patient Details  Name: Olivia Shah MRN: [id number] Date of Birth: 08/29/1956   Dis[facility] location: [facility] 24/7 CARE  Length of Stay: 86 DAYS  Discharge activity level: MIN-MOD LEVEL  Home/community participation: ACTIVE  Patient response HF:SFSELT Literacy - How often do you need to have someone help you when you read instructions, pamphlets, or other written material from your doctor or pharmacy?: Often  Patient response RV:UYEBXI Isolation - How often do you feel lonely or isolated from those around you?: Never  Services provided included: MD, RD, PT, OT, SLP, RN, CM, Pharmacy, Neuropsych, SW  Financial Services:  Charity fundraiser Utilized: Medicare    Choices offered to/list presented to: PT AD NIECE  Follow-up services arranged:  Home Health, Patient/Family request agency HH/DME, DME Home Health Agency: [facility]-PT, OT, SP    DME : [facility] AND WHEELCHAIR FAMILY TO GET REST OF EQUIPMENT ON THEIR OWN HH/DME Requested Agency: [facility]  Patient response to transportation need: Is the patient able to respond to transportation needs?: Yes In the past 12 months, has lack of transportation kept you from medical appointments or from getting medications?: No In the past 12 months, has lack of transportation kept you from meetings, work, or from getting things needed for daily living?: No    Comments (or additional information): [facility] [place].  Patient/Family verbalized understanding of follow-up arrangements:  Yes  Individual responsible for coordination of the follow-up plan: TIFFANY-NIECE-POA  [phone]  Confirmed correct DME delivered: Elease Hashimoto 07/07/2021    Elease Hashimoto

## 2021-07-07 NOTE — Patient Care Conference (Signed)
Inpatient RehabilitationTeam Conference and Plan of Care Update Date: 07/07/2021   Time: 13:05 PM    Patient Name: Olivia Shah      Medical Record Number: [id number]  Date of Birth: 1956-05-27 Sex: Female         Room/Bed: 4M01C/4M01C-01 Payor Info: Payor: MEDICARE / Plan: MEDICARE PART A AND B / Product Type: *No Product type* /    Admit Date/Time:  06/16/2021  4:08 PM  Primary Diagnosis:  [facility] Problems: Principal Problem:   Debility    Expected Discharge Date: Expected Discharge Date: 07/08/21  Team Members Present: Physician leading conference: Dr. Leeroy Cha Social Worker Present: Ovidio Kin, LCSW Nurse Present: Dorien Chihuahua, RN PT Present: Ginnie Smart, PT OT Present: Leretha Pol, OT SLP Present: Weston Anna, SLP PPS Coordinator present : Gunnar Fusi, SLP     Current Status/Progress Goal Weekly Team Focus  Bowel/Bladder   incontinent of bowel and bladder at night , has episodes of continence thoughout the day  regain continence to bowel and bladder      Swallow/Nutrition/ Hydration   dys 2 textures and thin liquids, supervision A  Supervision A  education   ADL's   min assist  min assist  functional mob, ADL training, activity tolerance, caregiver education, dc planning   Mobility   minA bed mobility, min to modA for stand<>pivot transfers, gait ~57ft with minA and RW  grossly MinA  Attention, functional transfers, gait training, DC planning, family ed   Communication   mod A, reduced verbal output but can communicate at phrase level  mod A  education   Safety/Cognition/ Behavioral Observations  has intermittent confusion, follows commands, needs encouragement  max-mod A  education   Pain   Denies pain  No evidence of pain      Skin   skin intact  skin to remain intact        Discharge Planning:  Family education completed and feel prepared to provide 24/7 care to pt at home. Discharge tomorrow   Team Discussion: MD  adjusted medications for seizures and BP. Patient incontinent of bowel and bladder at night , has episodes of continence thoughout the day.  Patient on target to meet rehab goals: yes, currently transfers and short distance gait with min assist.  D2; thin diet with supervision for meals. Severe cognitive  deficits with decreased verbal expression and communication.  *See Care Plan and progress notes for long and short-term goals.   Revisions to Treatment Plan:  None  Teaching Needs: Safety, transfers, toileting, medications, secondary risk management, dietary modifications, etc.  Current Barriers to Discharge: Decreased caregiver support, Home enviroment access/layout, and Behavior  Possible Resolutions to Barriers: Family education completed 07/06/21 [place] follow up services recommended     Medical Summary Current Status: fatigue, flat affect, hypotension, CBGs elevated, eating better, no longer spitting out medications, dysphagia  Barriers to Discharge: Medical stability;Behavior  Barriers to Discharge Comments: fatigue, flat affect, hypotension, CBGs elevated, dysphagia Possible Resolutions to [employer] Focus: discussed with neurology decreasing seizure medications and Keppra was decreased yesterday, continue SLP and dysphadia diet   Continued Need for Acute Rehabilitation Level of Care: The patient requires daily medical management by a physician with specialized training in physical medicine and rehabilitation for the following reasons: Direction of a multidisciplinary physical rehabilitation program to maximize functional independence : Yes Medical management of patient stability for increased activity during participation in an intensive rehabilitation regime.: Yes Analysis of laboratory values and/or radiology reports with  any subsequent need for medication adjustment and/or medical intervention. : Yes   I attest that I was present, lead the team conference, and concur with  the assessment and plan of the team.   Dorien Chihuahua B 07/07/2021, 3:30 PM

## 2021-07-08 ENCOUNTER — Other Ambulatory Visit (HOSPITAL_COMMUNITY): Payer: Self-pay

## 2021-07-08 DIAGNOSIS — H409 Unspecified glaucoma: Secondary | ICD-10-CM

## 2021-07-08 DIAGNOSIS — R5381 Other malaise: Secondary | ICD-10-CM | POA: Diagnosis not present

## 2021-07-08 DIAGNOSIS — H5440 Blindness, one eye, unspecified eye: Secondary | ICD-10-CM

## 2021-07-08 DIAGNOSIS — E119 Type 2 diabetes mellitus without complications: Secondary | ICD-10-CM

## 2021-07-08 DIAGNOSIS — H5340 Unspecified visual field defects: Secondary | ICD-10-CM

## 2021-07-08 LAB — GLUCOSE, CAPILLARY: Glucose-Capillary: 101 mg/dL — ABNORMAL HIGH (ref 70–99)

## 2021-07-08 MED ORDER — METFORMIN HCL 500 MG PO TABS
ORAL_TABLET | ORAL | 0 refills | Status: AC
  Filled 2021-07-08: qty 90, 30d supply, fill #0

## 2021-07-08 MED ORDER — POTASSIUM CHLORIDE CRYS ER 10 MEQ PO TBCR
20.0000 meq | EXTENDED_RELEASE_TABLET | Freq: Two times a day (BID) | ORAL | 0 refills | Status: AC
  Filled 2021-07-08: qty 120, 30d supply, fill #0

## 2021-07-08 MED ORDER — FAMOTIDINE 20 MG PO TABS
20.0000 mg | ORAL_TABLET | Freq: Two times a day (BID) | ORAL | 0 refills | Status: AC
  Filled 2021-07-08: qty 60, 30d supply, fill #0

## 2021-07-08 MED ORDER — LEVETIRACETAM 100 MG/ML PO SOLN
1000.0000 mg | Freq: Two times a day (BID) | ORAL | 0 refills | Status: DC
  Filled 2021-07-08: qty 473, 24d supply, fill #0

## 2021-07-08 MED ORDER — LACOSAMIDE 200 MG PO TABS
200.0000 mg | ORAL_TABLET | Freq: Two times a day (BID) | ORAL | 0 refills | Status: DC
  Filled 2021-07-08: qty 60, 30d supply, fill #0

## 2021-07-08 MED ORDER — MAGNESIUM OXIDE 400 MG PO TABS
ORAL_TABLET | Freq: Every day | ORAL | 0 refills | Status: DC
Start: 2021-07-08 — End: 2023-01-17
  Filled 2021-07-08: qty 15, 30d supply, fill #0

## 2021-07-08 MED ORDER — METOPROLOL TARTRATE 100 MG PO TABS
200.0000 mg | ORAL_TABLET | Freq: Every day | ORAL | 0 refills | Status: AC
  Filled 2021-07-08: qty 60, 30d supply, fill #0

## 2021-07-08 MED ORDER — METHYLPHENIDATE HCL 5 MG PO TABS
5.0000 mg | ORAL_TABLET | Freq: Two times a day (BID) | ORAL | 0 refills | Status: DC
Start: 2021-07-09 — End: 2021-08-06
  Filled 2021-07-08: qty 60, 30d supply, fill #0

## 2021-07-08 MED ORDER — HYDROCHLOROTHIAZIDE 12.5 MG PO TABS
12.5000 mg | ORAL_TABLET | Freq: Every day | ORAL | 0 refills | Status: AC
  Filled 2021-07-08: qty 30, 30d supply, fill #0

## 2021-07-08 MED ORDER — VITAMIN E 180 MG (400 UNIT) PO CAPS
400.0000 [IU] | ORAL_CAPSULE | Freq: Every day | ORAL | 0 refills | Status: AC
  Filled 2021-07-08: qty 30, 30d supply, fill #0

## 2021-07-08 MED ORDER — POLYETHYLENE GLYCOL 3350 17 GM/SCOOP PO POWD
17.0000 g | Freq: Every day | ORAL | 0 refills | Status: DC
  Filled 2021-07-08: qty 238, 14d supply, fill #0

## 2021-07-08 MED ORDER — LOSARTAN POTASSIUM 25 MG PO TABS
12.5000 mg | ORAL_TABLET | Freq: Every day | ORAL | 0 refills | Status: DC
  Filled 2021-07-08: qty 15, 30d supply, fill #0

## 2021-07-08 MED ORDER — SACCHAROMYCES BOULARDII 250 MG PO CAPS
250.0000 mg | ORAL_CAPSULE | Freq: Two times a day (BID) | ORAL | 0 refills | Status: DC
  Filled 2021-07-08: qty 20, 10d supply, fill #0

## 2021-07-08 NOTE — Progress Notes (Signed)
Patient discharged home with family. Patient and family state that they understand discarge instructions. Sanda Linger, LPN

## 2021-07-08 NOTE — Progress Notes (Signed)
PROGRESS NOTE   Ready for d/c today! Her nephew is at bedside and is helping her with lower body dressing She has no complaints  ROS: denies pain   Objective:   No results found. Recent Labs    07/06/21 0646  WBC 4.5  HGB 12.1  HCT 36.7  PLT 320    Recent Labs    07/06/21 0646  NA 139  K 4.3  CL 110  CO2 21*  GLUCOSE 107*  BUN 9  CREATININE 0.88  CALCIUM 9.1      Intake/Output Summary (Last 24 hours) at 07/08/2021 1140 Last data filed at 07/07/2021 1900 Gross per 24 hour  Intake 180 ml  Output --  Net 180 ml        Physical Exam: Vital Signs Blood pressure 104/62, pulse 78, temperature 97.9 F (36.6 C), resp. rate 16, height 5\' 2"  (1.575 m), weight 80 kg, SpO2 98 %. Gen: no distress, normal appearing HEENT: oral mucosa pink and moist, NCAT Cardio: Reg rate Chest: normal effort, normal rate of breathing Abd: soft, non-distended Ext: no edema Psych: pleasant, normal affect Skin: intact Neurological:     Mental Status: She is alert. She is disoriented.     oriented to self. Cortically blind Motor: Limited due to cognition, however left side appears weaker than right  Ambulated with RW to sink with 2-person assist Unable to see 2 fingers in front of her face and could not see me on the left. Left inattention with delayed output.  She was able to follow occasional one step motor commands with tactile cues.   Impaired initiation       Assessment/Plan: 1. Functional deficits which require 3+ hours per day of interdisciplinary therapy in a comprehensive inpatient rehab setting. Physiatrist is providing close team supervision and 24 hour management of active medical problems listed below. Physiatrist and rehab team continue to assess barriers to discharge/monitor patient progress toward functional and medical goals  Care Tool:  Bathing    Body parts bathed by patient: Right arm, Left arm,  Chest, Abdomen, Face   Body parts bathed by helper: Front perineal area, Buttocks, Right lower leg, Left lower leg, Left upper leg, Right upper leg     Bathing assist Assist Level: Minimal Assistance - Patient > 75%     Upper Body Dressing/Undressing Upper body dressing   What is the patient wearing?: Pull over shirt    Upper body assist Assist Level: Minimal Assistance - Patient > 75%    Lower Body Dressing/Undressing Lower body dressing      What is the patient wearing?: Pants, Incontinence brief     Lower body assist Assist for lower body dressing: Minimal Assistance - Patient > 75%     Toileting Toileting    Toileting assist Assist for toileting: Minimal Assistance - Patient > 75%     Transfers Chair/bed transfer  Transfers assist  Chair/bed transfer activity did not occur: Safety/medical concerns  Chair/bed transfer assist level: Minimal Assistance - Patient > 75%     Locomotion Ambulation   Ambulation assist   Ambulation activity did not occur: Safety/medical concerns  Assist level: Minimal Assistance - Patient > 75%  Assistive device: Walker-rolling Max distance: 35'   Walk 10 feet activity   Assist  Walk 10 feet activity did not occur: Safety/medical concerns  Assist level: Minimal Assistance - Patient > 75% Assistive device: Walker-rolling   Walk 50 feet activity   Assist Walk 50 feet with 2 turns activity did not occur: Safety/medical concerns         Walk 150 feet activity   Assist Walk 150 feet activity did not occur: Safety/medical concerns         Walk 10 feet on uneven surface  activity   Assist Walk 10 feet on uneven surfaces activity did not occur: Safety/medical concerns         Wheelchair     Assist Is the patient using a wheelchair?: Yes Type of Wheelchair: Manual    Wheelchair assist level: Dependent - Patient 0% Max wheelchair distance: 150    Wheelchair 50 feet with 2 turns  activity    Assist        Assist Level: Dependent - Patient 0%   Wheelchair 150 feet activity     Assist      Assist Level: Dependent - Patient 0%   Blood pressure 104/62, pulse 78, temperature 97.9 F (36.6 C), resp. rate 16, height 5\' 2"  (1.575 m), weight 80 kg, SpO2 98 %.  Medical Problem List and Plan: 1.  Deficits with mobility, swallowing, cognition secondary to seizures status post meningioma resection.             -patient may not shower             -ELOS/Goals: 29 days/Supervision/Min A             Continue CIR therapies including PT, OT, and SLP   Vitamin D level reviewed and normal 2.  Impaired mobility: Continue Lovenox             -antiplatelet therapy: N/A 3. Pain Management: PRN meds 4. Mood: LCSW to follow for evaluation and support.              -antipsychotic agents: N/A 5. Neuropsych: This patient is not capable of making decisions on her own behalf. 6. Skin/Wound Care: Routine pressure relief measures.  7. Fluids/Electrolytes/Nutrition: Monitor I/Os 8. HTN: See #17. Monitor BP TID--continue Losartan, hydralazine. D/c amlodipine and decrease Cozaar to 12.5mg .            controlled 10/11 9.T2DM: Hgb A1C-6.6 and well controlled on home regimen 1000 mg am/500 mg pm             --Will discontinue Lantus after pm dose. Continue metformin as will off steroids.              --Monitor BS ac/hs and d/c ISS.               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 Contacted NSGY to discuss with niece and to advise [place] when repeat imaging is warranted- discussed this with family 84.  Hypokalemia: normalized with supplementation, monitor weekly. Decrease Cozaar to 12.5mg . D/c potassium supplement 12. Dysphagia: continue SLP. Discussed with her the importance of swallowing her medications to the best of her ability 84.  Leukocytosis: normalized 14. Decreased appetite: Improved, d/c'ed megace 15. Suboptimal with magnesium: continue magnesium 250mg  HS 16. Vomiting meds: requested pharamcy to switch as many meds  to IV as she is throwing them up. KUB reviewed and shows gas and scattered stool. Enema and Miralax ordered. Resolved.  17. Hypotension: d/c Hydralazine and amlodipine. Continue Cozaar to 12.5mg .  18. Fatigue:   discuss with neurology whether seizure medications can be decreased- appreciate Dr. Hortense Ramal decreasing [place] 19. Disposition: HFU scheduled.    >30 minutes spent in discharge of patient including review of medications and follow-up appointments, physical examination, and in answering all patient's questions     LOS: 22 days A FACE TO FACE EVALUATION WAS [place] 07/08/2021, 11:40 AM

## 2021-07-08 NOTE — Progress Notes (Signed)
Discharge Inpatient Rehabilitation Medication Review by a Pharmacist  A complete drug regimen review was completed for this patient to identify any potential clinically significant medication issues.  High Risk Drug Classes Is patient taking? Indication by Medication  Antipsychotic No   Anticoagulant No   Antibiotic No   Opioid No   Antiplatelet Yes Aspirin: CVA prevention  Hypoglycemics/insulin Yes Metformin: DM control  Vasoactive Medication Yes Hydrochlorothiazide: BP control Losartan: BP control Metoprolol tartrate: BP control  Chemotherapy No   Other Yes Acetaminophen: PRN pain control Ascorbic acid: supplementation/wound healing Cosopt opht: glaucoma treatment Famotidine: GERD ppx Lacosamide: seizure prevention Latanoprost opht: glaucoma treatment Levetiracetam: seizure prevention Magnesium gluconate: nutrition/supplementation Methylphenidate: ADHD/ADD Multivitamin: nutrition/supplementation PEG: constipation treatment Potassium chloride: nutrition/supplementation Vitamin E: nutrition/supplementation     Type of Medication Issue Identified Description of Issue Recommendation(s)  Drug Interaction(s) (clinically significant)     Duplicate Therapy     Allergy     No Medication Administration End Date     Incorrect Dose     Additional Drug Therapy Needed     Significant med changes from prior encounter (inform family/care partners about these prior to discharge).    Other       Clinically significant medication issues were identified that warrant physician communication and completion of prescribed/recommended actions by midnight of the next day:  No  Pharmacist comments: n/a  Time spent performing this drug regimen review (minutes):  20 minutes   Thank you for allowing pharmacy to be a part of this patient's care.  Ardyth Harps, PharmD Clinical Pharmacist

## 2021-07-08 NOTE — Discharge Summary (Signed)
Physician Discharge Summary  Patient ID: Olivia Shah MRN: [id number] DOB/AGE: [birth year] 67 y.o.  Admit date: 06/16/2021 Discharge date: 07/08/2021  Discharge Diagnoses:  Principal Problem:   Debility Active Problems:   Hypokalemia   Seizures (HCC)   Benign essential HTN   Diabetes (HCC)   Glaucoma   Blind right eye   Visual field defect of left eye   Discharged Condition: good  Significant Diagnostic Studies: DG Abd 1 View  Result Date: 06/23/2021 CLINICAL DATA:  65 year old female with history of nausea and vomiting. EXAM: ABDOMEN - 1 VIEW COMPARISON:  No priors. FINDINGS: Gas and stool are seen scattered throughout the colon extending to the level of the distal rectum. No pathologic distension of small bowel is noted. No gross evidence of pneumoperitoneum. Surgical clips project over the right upper quadrant of the abdomen, likely from prior cholecystectomy. Surgical clips are also noted in the low anatomic pelvis. IMPRESSION: 1. Nonobstructive bowel gas pattern. 2. No pneumoperitoneum. Electronically Signed   By: Vinnie Langton M.D.   On: 06/23/2021 16:32   MR BRAIN WO CONTRAST  Result Date: 06/23/2021 CLINICAL DATA:  Benign neoplasm, brain/CNS s/p tumor resection, evaluate for PCA infarcts. Visual loss. EXAM: MRI HEAD WITHOUT CONTRAST TECHNIQUE: Multiplanar, multiecho pulse sequences of the brain and surrounding structures were obtained without intravenous contrast. COMPARISON:  05/19/2021 FINDINGS: Brain: There has been expected interval evolution of the infarcts involving the basal ganglia, temporal lobe, inferior frontal lobe, and insula on the right on the prior MRI with a small amount of diffusion weighted signal abnormality remaining. Minimal blood products and/or mineralization are noted in the region of the right caudate infarct. No new infarct is identified. Small T2 hyperintensities in the cerebral white matter and pons are unchanged and nonspecific but compatible  with mild chronic small vessel ischemic disease. There is mild cerebral atrophy. Pneumocephalus has resolved. A small extra-axial fluid collection subjacent to the right pterional craniotomy measures up to 7 mm in thickness, smaller than on the prior study. Midline shift has resolved. Assessment for any potential residual skull base tumor is limited on this noncontrast study. Vascular: Major intracranial vascular flow voids are preserved. Skull and upper cervical spine: Right pterional craniotomy. No suspicious marrow lesion. Sinuses/Orbits: Right cataract extraction. Clear paranasal sinuses. Trace bilateral mastoid fluid. Other: None. IMPRESSION: 1. No new infarct or other acute intracranial abnormality. 2. Evolving subacute infarcts and postoperative changes. Electronically Signed   By: Logan Bores M.D.   On: 06/23/2021 08:34    Labs:  Basic Metabolic Panel: BMP Latest Ref Rng & Units 07/06/2021 06/30/2021 06/29/2021  Glucose 70 - 99 mg/dL 107(H) 103(H) 104(H)  BUN 8 - 23 mg/dL 9 9 6(L)  Creatinine 0.44 - 1.00 mg/dL 0.88 0.82 0.84  Sodium 135 - 145 mmol/L 139 138 137  Potassium 3.5 - 5.1 mmol/L 4.3 3.5 2.9(L)  Chloride 98 - 111 mmol/L 110 107 105  CO2 22 - 32 mmol/L 21(L) 21(L) 21(L)  Calcium 8.9 - 10.3 mg/dL 9.1 8.6(L) 8.5(L)     CBC: CBC Latest Ref Rng & Units 07/06/2021 06/29/2021 06/22/2021  WBC 4.0 - 10.5 K/uL 4.5 4.6 7.0  Hemoglobin 12.0 - 15.0 g/dL 12.1 11.0(L) 13.2  Hematocrit 36.0 - 46.0 % 36.7 33.1(L) 40.1  Platelets 150 - 400 K/uL 320 232 296     CBG: Recent Labs  Lab 07/07/21 0600 07/07/21 1127 07/07/21 1649 07/07/21 2111 07/08/21 0606  GLUCAP 93 117* 92 151* 101*    Brief HPI:   Olivia Shah is a 65 y.o. LH-female with history of T2DM, HTN, congenital right blindness with glaucoma and progressive loss of vision in left eye.  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  Postop course complicated by status  epilepticus requiring addition of multiple medication as well as intubation for airway protection.  Olivia Shah failed attempts at extubation requiring tracheostomy and was transferred to [facility] on 09/08 for vent weaning and management.  Her trach was dislodged on 09/11 and Olivia Shah was treated with racemic epi and steroids without need for replacement.  Her respiratory status was stable and Olivia Shah was weaned off oxygen.  Swallow evaluation done and Olivia Shah was started on dysphagia 1 thin liquids with aspiration precautions.  Patient was noted to be limited by visual deficits with dysphagia and weakness.  CIR was recommended due to functional decline.   Hospital Course: Olivia Shah was admitted to rehab 06/16/2021 for inpatient therapies to consist of PT, ST and OT at least three hours five days a week. Past admission physiatrist, therapy team and rehab RN have worked together to provide customized collaborative inpatient rehab.  Olivia Shah was noted to have issues with fatigue as well as lethargy.  Her blood pressures were monitored on TID basis and was noted to be on downward trend with increase in activity therefore amlodipine and hydralazine were tapered off. Hemoglobin A1c was noted to be 6.6 therefore Lantus was discontinued and metformin was resumed.  Follow up check of BMET showed lytes and renal status to be WNL. Follow up CBC showed H/H to be stable overall.   Blood sugars were monitored with ac/hs CBG checks and SSI was use prn for tighter BS control. Appetite has improved with addition of megace and use of nutritional supplements. As intake improved, megace was discontinued and Olivia Shah is tolerating advancement to dysphagia 3, thin liquids with use of aspiration precautions. Dr. Venetia Constable was consulted for input on patient's cognitive status as well as ongoing issues with waxing and waning of mental status.  Follow-up MRI brain showed no acute changes and lethargy was felt to be partially due to caudate infarct as well as  high-dose AEDsRitalin was added to help with lethargy and energy as well as focus and attention with some improvement.     Dr. Hortense Ramal was consulted for input regarding tapering of antiepileptics and Keppra was decreased to 1500 mg twice daily. Her lethargy has improved with decrease in Keppra dose but Olivia Shah continued to have significant delay with fatigue limiting activity tolerance therefore neurology was reconsulted prior to discharge and Keppra was further decreased 1000 mg twice daily. Olivia Shah has been seizure free during her stay and family has been educated on seizure precautions as well as monitoring for any redcurrant  episodes.  Olivia Shah has made slow steady gains during her stay and currently requires min assist with most tasks. Olivia Shah will continue to receive follow up HHPT, HHOT and HHSP by [facility] after discharge.    Rehab course: During patient's stay in rehab weekly team conferences were held to monitor patient's progress, set goals and discuss barriers to discharge. At admission, patient required max to total assist with basic ADL tasks and total assist with mobility.  Olivia Shah exhibited severe cognitive linguistic deficits with decrease in attention, limited verbal output, and was able to sustain attention for less than a minute with cognitive tasks and was able to sustain attention to meals for 10 minutes with mod verbal  cues for p.o. intake. Olivia Shah  has had improvement in activity tolerance, balance, postural control as well as ability to compensate for deficits.  Olivia Shah requires min assist with with step-by-step simple verbal cues for sequencing to complete her ADL task.  Olivia Shah requires min assist for transfers and min assist +2 to ambulate 35 feet with rolling walker.  Olivia Shah was able to climb 1 step with mod assist.  Continues to have significant cognitive deficits and is oriented to place and situation with slums score 9 out of 30.  Olivia Shah is showing some improvement in recall, initiation as well as cognition.  Olivia Shah requires mod assist for communication, max assist for cognition and supervision for swallow function/strategies. Family education was completed regarding all aspects of safety and care.    Discharge disposition: 01-Home or Self Care  Diet: Chopped foods. Limit distractions at  meals.   Special Instructions: Pills needs to be crushed and administered in puree. Limit distractions at meals.   Discharge Instructions     Ambulatory referral to Neurology   Complete by: As directed    An appointment is requested in approximately: 2 weeks follow up for new onset seizures post tumor resection.   Ambulatory referral to Physical Medicine Rehab   Complete by: As directed    Hospital follow up      Allergies as of 07/08/2021       Reactions   Cashew Nut (anacardium Occidentale) Skin Test Hives   Hives to multiple nuts   Lisinopril Cough   Shellfish Allergy Hives        Medication List     STOP taking these medications    amLODipine 10 MG tablet Commonly known as: NORVASC   CALCIUM 600 + D PO   feeding supplement (JEVITY 1.2 CAL) Liqd   feeding supplement (PROSource TF) liquid   Garlic 5784 MG Caps   irbesartan-hydrochlorothiazide 150-12.5 MG tablet Commonly known as: AVALIDE   lacosamide 200 mg in sodium chloride 0.9 % 25 mL   levETIRAcetam 2,000 mg in sodium chloride 0.9 % 250 mL   pantoprazole sodium 40 mg Commonly known as: PROTONIX       TAKE these medications    acetaminophen 325 MG tablet Commonly known as: TYLENOL Take 1-2 tablets (325-650 mg total) by mouth every 4 (four) hours as needed for mild pain.   aspirin EC 81 MG tablet Take 81 mg by mouth daily.   dorzolamide-timolol 22.3-6.8 MG/ML ophthalmic solution Commonly known as: COSOPT Place 1 drop into the left eye 2 (two) times daily.   famotidine 20 MG tablet Commonly known as: PEPCID Take 1 tablet (20 mg total) by mouth 2 (two) times daily.   hydrochlorothiazide 12.5 MG tablet Commonly  known as: HYDRODIURIL Take 1 tablet (12.5 mg total) by mouth daily.   lacosamide 200 MG Tabs tablet Commonly known as: VIMPAT Take 1 tablet (200 mg total) by mouth 2 (two) times daily.   latanoprost 0.005 % ophthalmic solution Commonly known as: XALATAN Place 1 drop into the left eye at bedtime.   levETIRAcetam 100 MG/ML solution Commonly known as: KEPPRA Take 10 mLs (1,000 mg total) by mouth 2 (two) times daily.   losartan 25 MG tablet Commonly known as: COZAAR Take 1/2 tablet (12.5 mg total) by mouth daily.   magnesium oxide 400 MG tablet Commonly known as: MAG-OX Take 1/2 tablet (200 mg total) by mouth at bedtime.   metFORMIN 500 MG tablet Commonly known as: GLUCOPHAGE Take 2 tablets (1,000 mg total) by  mouth daily with breakfast AND 1 tablet (500 mg total) daily with supper. What changed: See the new instructions.   methylphenidate 5 MG tablet Commonly known as: RITALIN Take 1 tablet (5 mg total) by mouth 2 (two) times daily with breakfast and lunch. Start taking on: July 09, 2021   metoprolol tartrate 100 MG tablet Commonly known as: LOPRESSOR Take 2 tablets (200 mg total) by mouth at bedtime.   multivitamin with minerals Tabs tablet Take 1 tablet by mouth daily.   polyethylene glycol powder 17 GM/SCOOP powder Commonly known as: GLYCOLAX/MIRALAX Take 17 g by mouth daily.   potassium chloride 10 MEQ tablet Commonly known as: KLOR-CON Take 2 tablets (20 mEq total) by mouth in the morning and at supper.   saccharomyces boulardii 250 MG capsule Commonly known as: FLORASTOR Take 1 capsule (250 mg total) by mouth 2 (two) times daily.   vitamin C 1000 MG tablet Take 1,000 mg by mouth daily.   vitamin E 180 MG (400 UNITS) capsule Take 1 capsule (400 Units total) by mouth daily.        Follow-up Information     Raulkar, Clide Deutscher, MD Follow up.   Specialty: Physical Medicine and Rehabilitation Why: 08/28/21 please arrive at 9:00am for 9:20am  appointment Contact information: [address] [place] [address] [phone]         Judith Part, MD. Call in 1 day(s).   Specialty: Neurosurgery Why: for post op follow up Contact information: [place] [address] [phone]         Seward Carol, MD. Call in 1 day(s).   Specialty: Internal Medicine Why: for post hospital follow up Contact information: [address] [place] [address] [phone]         [facility] Follow up.   Why: Office will call with follow up appt (seizures) Contact information: [address]     [place] [address] [phone]                Signed: Bary Leriche 07/08/2021, 11:16 PM

## 2021-07-15 ENCOUNTER — Ambulatory Visit: Payer: Medicare Other | Admitting: Neurology

## 2021-07-16 ENCOUNTER — Ambulatory Visit (INDEPENDENT_AMBULATORY_CARE_PROVIDER_SITE_OTHER): Payer: Medicare Other | Admitting: Neurology

## 2021-07-16 ENCOUNTER — Encounter: Payer: Self-pay | Admitting: Neurology

## 2021-07-16 VITALS — BP 131/83 | HR 86

## 2021-07-16 DIAGNOSIS — G40909 Epilepsy, unspecified, not intractable, without status epilepticus: Secondary | ICD-10-CM | POA: Diagnosis not present

## 2021-07-16 DIAGNOSIS — Z9889 Other specified postprocedural states: Secondary | ICD-10-CM | POA: Diagnosis not present

## 2021-07-16 DIAGNOSIS — Z5181 Encounter for therapeutic drug level monitoring: Secondary | ICD-10-CM

## 2021-07-16 DIAGNOSIS — E559 Vitamin D deficiency, unspecified: Secondary | ICD-10-CM

## 2021-07-16 DIAGNOSIS — Z86018 Personal history of other benign neoplasm: Secondary | ICD-10-CM

## 2021-07-16 DIAGNOSIS — I6782 Cerebral ischemia: Secondary | ICD-10-CM

## 2021-07-16 NOTE — Patient Instructions (Addendum)
Continue with physical therapy  Continue with Keppra 1000 mg BID and Lacosamide 200 mg BID  Will check Keppra and Lacosamide level today  Will check Vitamin D level today  Will also obtain a lipid panel  Return in 6 months or sooner if worse

## 2021-07-16 NOTE — Progress Notes (Signed)
[facility]  PATIENT: Olivia Shah DOB: [birth year]  REFERRING CLINICIAN: Bary Leriche, PA-C HISTORY FROM: Patient, sister and nephew REASON FOR VISIT: New onset seizure s/p meningioma resection    HISTORICAL  CHIEF COMPLAINT:  Chief Complaint  Patient presents with   New Patient (Initial Visit)    Rm 23, with sister and nephew, here to discuss seizure medications     HISTORY OF PRESENT ILLNESS:  This is a 65 year old woman with past medical history of type 2 diabetes, hypertension, congenital right eye blindness with glaucoma and progressive loss of left vision who was found to have 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  She underwent a total gross resection on August 22 and postop course complicated by focal status epilepticus requiring multiple anti seizure medications.  She was finally extubated, her ASM regimen was simplified to Keppra 1000 mg twice daily and lacosamide 200 mg twice daily.  She did have a follow-up MRI which showed incidental right hemispheric strokes and also in the right caudate.  She did have a long rehab course.  She was finally discharged home October 12.  She was not noted to have any seizures while in rehab.  Per family she continued to have cognitive issues but has improved.  She continues with physical therapy and Occupational Therapy.  She also have improvement in her swallowing status. In terms of seizures she denies any past medical history of seizures, denies any family history of seizures, denies any seizure risk factor, seizure started status post meningioma resection.  She still need assistance from transferring from sitting to standing and from laying down to sitting up.  She also needs assistance with using the bathroom.  Handedness: Left handed   Seizure Type: right focal seizure  Current frequency: None since being in rehab   Any injuries from seizures: None   Seizure risk factors: Meningioma  resection   Previous ASMs: Levetiracetam, Lacosamide, Phenytoin, Phenobarb, Propofol   Currenty ASMs: Levetiracetam, Lacosamide   ASMs side effects: None   Brain Images: Skull based meningioma (04/21/2021)   Previous EEGs: Status epilepticus  (05/21/2021)   OTHER MEDICAL CONDITIONS: Diabetes mellitus type 2, hypertension, congenital right eye blindness.   REVIEW OF SYSTEMS: Full 14 system review of systems performed and negative with exception of: As noted in the HPI  ALLERGIES: Allergies  Allergen Reactions   Cashew Nut (Anacardium Occidentale) Skin Test Hives    Hives to multiple nuts   Lisinopril Cough   Shellfish Allergy Hives    HOME MEDICATIONS: Outpatient Medications Prior to Visit  Medication Sig Dispense Refill   acetaminophen (TYLENOL) 325 MG tablet Take 1-2 tablets (325-650 mg total) by mouth every 4 (four) hours as needed for mild pain.     Ascorbic Acid (VITAMIN C) 1000 MG tablet Take 1,000 mg by mouth daily.     aspirin EC 81 MG tablet Take 81 mg by mouth daily.     dorzolamide-timolol (COSOPT) 22.3-6.8 MG/ML ophthalmic solution Place 1 drop into the left eye 2 (two) times daily.     famotidine (PEPCID) 20 MG tablet Take 1 tablet (20 mg total) by mouth 2 (two) times daily. 60 tablet 0   hydrochlorothiazide (HYDRODIURIL) 12.5 MG tablet Take 1 tablet (12.5 mg total) by mouth daily. 30 tablet 0   lacosamide (VIMPAT) 200 MG TABS tablet Take 1 tablet (200 mg total) by mouth 2 (two) times daily. 60 tablet 0   latanoprost (XALATAN) 0.005 % ophthalmic solution  Place 1 drop into the left eye at bedtime.     levETIRAcetam (KEPPRA) 100 MG/ML solution Take 10 mLs (1,000 mg total) by mouth 2 (two) times daily. 473 mL 0   losartan (COZAAR) 25 MG tablet Take 1/2 tablet (12.5 mg total) by mouth daily. 15 tablet 0   magnesium oxide (MAG-OX) 400 MG tablet Take 1/2 tablet (200 mg total) by mouth at bedtime. 15 tablet 0   metFORMIN (GLUCOPHAGE) 500 MG tablet Take 2 tablets (1,000 mg  total) by mouth daily with breakfast AND 1 tablet (500 mg total) daily with supper. 90 tablet 0   methylphenidate (RITALIN) 5 MG tablet Take 1 tablet (5 mg total) by mouth 2 (two) times daily with breakfast and lunch. 60 tablet 0   metoprolol tartrate (LOPRESSOR) 100 MG tablet Take 2 tablets (200 mg total) by mouth at bedtime. 60 tablet 0   Multiple Vitamin (MULTIVITAMIN WITH MINERALS) TABS tablet Take 1 tablet by mouth daily.     polyethylene glycol powder (GLYCOLAX/MIRALAX) 17 GM/SCOOP powder Take 17 g by mouth daily. 238 g 0   potassium chloride (KLOR-CON) 10 MEQ tablet Take 2 tablets (20 mEq total) by mouth in the morning and at supper. 120 tablet 0   saccharomyces boulardii (FLORASTOR) 250 MG capsule Take 1 capsule (250 mg total) by mouth 2 (two) times daily. 60 capsule 0   vitamin E 180 MG (400 UNITS) capsule Take 1 capsule (400 Units total) by mouth daily. 30 capsule 0   No facility-administered medications prior to visit.    PAST MEDICAL HISTORY: Past Medical History:  Diagnosis Date   Arthritis    Diabetes mellitus without complication ([place])    Hypertension     PAST SURGICAL HISTORY: Past Surgical History:  Procedure Laterality Date   APPLICATION OF CRANIAL NAVIGATION N/A 05/18/2021   Procedure: APPLICATION OF CRANIAL NAVIGATION;  Surgeon: Judith Part, MD;  Location: [facility];  Service: Neurosurgery;  Laterality: N/A;   BREAST BIOPSY Right    CHOLECYSTECTOMY     CRANIOTOMY Right 05/18/2021   Procedure: Right craniotomy for tumor resection;  Surgeon: Judith Part, MD;  Location: [facility];  Service: Neurosurgery;  Laterality: Right;   EYE SURGERY     as a child   HERNIA REPAIR      FAMILY HISTORY: Family History  Problem Relation Age of Onset   Breast cancer Neg Hx     SOCIAL HISTORY: Social History   Socioeconomic History   Marital status: Single    Spouse name: Not on file   Number of children: Not on file   Years of education: Not on file   Highest  education level: Not on file  Occupational History   Not on file  Tobacco Use   Smoking status: Never   Smokeless tobacco: Never  Substance and Sexual Activity   Alcohol use: Not on file   Drug use: Not on file   Sexual activity: Not on file  Other Topics Concern   Not on file  Social History Narrative   Not on file   Social Determinants of Health   Financial Resource Strain: Not on file  Food Insecurity: Not on file  Transportation Needs: Not on file  Physical Activity: Not on file  Stress: Not on file  Social Connections: Not on file  Intimate Partner Violence: Not on file     PHYSICAL EXAM GENERAL EXAM/CONSTITUTIONAL: Vitals:  Vitals:   07/16/21 1251  BP: 131/83  Pulse: 86   There  is no height or weight on file to calculate BMI. Wt Readings from Last 3 Encounters:  07/07/21 176 lb 5.9 oz (80 kg)  06/04/21 194 lb 10.7 oz (88.3 kg)  05/11/21 190 lb 3 oz (86.3 kg)   Patient is in no distress; well developed, nourished and groomed; neck is supple  EYES: Pupils round and reactive to light, Visual fields full to confrontation, Extraocular movements intacts,  No results found.  MUSCULOSKELETAL: Gait, strength, tone, movements noted in Neurologic exam below  NEUROLOGIC: MENTAL STATUS:  No flowsheet data found. awake, alert, oriented to person, place but not time Difficulty with recent memory, unable to count the number of quarters in a dollar 75, in a dollar.  She was also noted to make paraphasic error and also to perseverate.  language fluent, difficulty with comprehension, difficulty with naming but able to follow some simple command.    CRANIAL NERVE:   2nd, 3rd, 4th, 6th - left pupil is reactive to light, visual fields full to confrontation on left but she has preference in the right visual field vs. Left visual field neglect   MOTOR:  At least antigravity.    GAIT/STATION:  Wheelchair     DIAGNOSTIC DATA (LABS, IMAGING, TESTING) - I reviewed  patient records, labs, notes, testing and imaging myself where available.  Lab Results  Component Value Date   WBC 4.5 07/06/2021   HGB 12.1 07/06/2021   HCT 36.7 07/06/2021   MCV 90.0 07/06/2021   PLT 320 07/06/2021      Component Value Date/Time   NA 139 07/06/2021 0646   K 4.3 07/06/2021 0646   CL 110 07/06/2021 0646   CO2 21 (L) 07/06/2021 0646   GLUCOSE 107 (H) 07/06/2021 0646   BUN 9 07/06/2021 0646   CREATININE 0.88 07/06/2021 0646   CALCIUM 9.1 07/06/2021 0646   PROT 5.8 (L) 06/17/2021 0504   ALBUMIN 2.7 (L) 06/17/2021 0504   AST 15 06/17/2021 0504   ALT 36 06/17/2021 0504   ALKPHOS 94 06/17/2021 0504   BILITOT 0.3 06/17/2021 0504   GFRNONAA >60 07/06/2021 0646   Lab Results  Component Value Date   CHOL 201 (H) 07/16/2021   HDL 59 07/16/2021   LDLCALC 124 (H) 07/16/2021   TRIG 101 07/16/2021   Lab Results  Component Value Date   HGBA1C 6.6 (H) 06/05/2021   No results found for: VITAMINB12 No results found for: TSH  MRI 04/22/2021 3.2 x 2.6 x 2.4 cm dural-based mass compatible with meningioma growing along the planum sphenoidale, tuberculum sellae and extending to the paraclinoid regions bilaterally. Involvement of regional structures, as detailed (including, but not limited to, significant mass effect upon the optic apparatus). The mass has not significantly changed in size as compared to the prior outside MRI of 03/18/2021. Mild chronic small vessel ischemic changes within the cerebral white matter and pons. Mild generalized parenchymal atrophy.   MRI 05/19/2021 1. Interval right pterional craniotomy and resection of an anterior skull base meningioma. No significant residual masslike enhancement. This study will serve as a new baseline future follow-up imaging. 2. Acute infarcts in the right anterior temporal lobe, inferior frontal lobe, lentiform nucleus and posterior limb of the right internal capsule with mild associated edema. 3. Moderate right greater  than left antidependent pneumocephalus (up to 1.8 cm thick) and small volume right-sided extra-axial hemorrhage and/or gelfoam subjacent to the craniotomy (up to 0.9 cm thick) with resulting 3 mm of leftward midline shift and effacement of the right lateral  ventricle.   MRI 06/23/2021 1. No new infarct or other acute intracranial abnormality. 2. Evolving subacute infarcts and postoperative changes.   EEG  This study was initially suggestive of cortical dysfunction in right frontotemporal region likely secondary to underlying structural abnormality.  At around 1443 on 05/20/2021, patient started having seizures during which EKG was noted to have left facial twitching and no clinical signs were noted.  Seizures arising from left frontotemporal region, average duration about 2 to 3 minutes.  Patient had around 9 seizures between 40 43-16 49 on 05/20/2021.  Seizure medications were adjusted after which seizures improved.  However seizures recorded on 2332 on 05/20/2021 and patient had average 12 seizures per hour, lasting only 1 minute.  This EEG pattern is consistent with focal status epilepticus.  There is also evidence of moderate diffuse encephalopathy, nonspecific etiology but likely due to seizures.  I personally reviewed brain Images and previous EEG reports.   ASSESSMENT AND PLAN  65 y.o. year old female  with past medical history of diabetes mellitus type 2, hypertension, congenital blindness of the right eye, no previous history of seizures who was found incidentally to have a skull base meningioma, postresection course complicated by status epilepticus requiring up to 4 antiseizure medications.  Her antiseizure medication has been simplified in rehab to Keppra 1000 mg twice daily and lacosamide 200 mg twice daily.  She was not noted to have any additional seizures since her extubation and during all her rehab course.  She is tolerating the medication wells, denies any side effect.  I will obtain a  Keppra level and Vimpat level and will adjust as indicated.  She was also found to have postop right hemispheric strokes, stroke likely postprocedure.  She is on aspirin, I will check a lipid panel panel, her hemoglobin A1c was 6.6.  I will see her in 6 months for follow-up or sooner if worse, at that time if no additional seizures we will start decreasing her antiseizure medication.    1. Seizure disorder ([place])   2. Cerebral ischemia    3. Vitamin D deficiency, unspecified    4. Status post resection of meningioma   5. Therapeutic drug monitoring     PLAN: Continue with physical therapy  Continue with Keppra 1000 mg BID and Lacosamide 200 mg BID  Will check Keppra and Lacosamide level today  Will check Vitamin D level today  Will also obtain a lipid panel  Return in 6 months or sooner if worse    Per [facility] statutes, patients with seizures are not allowed to drive until they have been seizure-free for six months.  Other recommendations include using caution when using heavy equipment or power tools. Avoid working on ladders or at heights. Take showers instead of baths.  Do not swim alone.  Ensure the water temperature is not too high on the home water heater. Do not go swimming alone. Do not lock yourself in a room alone (i.e. bathroom). When caring for infants or small children, sit down when holding, feeding, or changing them to minimize risk of injury to the child in the event you have a seizure. Maintain good sleep hygiene. Avoid alcohol.  Also recommend adequate sleep, hydration, good diet and minimize stress.   During the Seizure  - First, ensure adequate ventilation and place patients on the floor on their left side  Loosen clothing around the neck and ensure the airway is patent. If the patient is clenching the teeth, do not  force the mouth open with any object as this can cause severe damage - Remove all items from the surrounding that can be hazardous. The patient  may be oblivious to what's happening and may not even know what he or she is doing. If the patient is confused and wandering, either gently guide him/her away and block access to outside areas - Reassure the individual and be comforting - Call 911. In most cases, the seizure ends before EMS arrives. However, there are cases when seizures may last over 3 to 5 minutes. Or the individual may have developed breathing difficulties or severe injuries. If a pregnant patient or a person with diabetes develops a seizure, it is prudent to call an ambulance. - Finally, if the patient does not regain full consciousness, then call EMS. Most patients will remain confused for about 45 to 90 minutes after a seizure, so you must use judgment in calling for help. - Avoid restraints but make sure the patient is in a bed with padded side rails - Place the individual in a lateral position with the neck slightly flexed; this will help the saliva drain from the mouth and prevent the tongue from falling backward - Remove all nearby furniture and other hazards from the area - Provide verbal assurance as the individual is regaining consciousness - Provide the patient with privacy if possible - Call for help and start treatment as ordered by the caregiver   After the Seizure (Postictal Stage)  After a seizure, most patients experience confusion, fatigue, muscle pain and/or a headache. Thus, one should permit the individual to sleep. For the next few days, reassurance is essential. Being calm and helping reorient the person is also of importance.  Most seizures are painless and end spontaneously. Seizures are not harmful to others but can lead to complications such as stress on the lungs, brain and the heart. Individuals with prior lung problems may develop labored breathing and respiratory distress.     Orders Placed This Encounter  Procedures   Levetiracetam level   Lacosamide   Lipid Panel   Vitamin D, 25-hydroxy      No orders of the defined types were placed in this encounter.   Return in about 6 months (around 01/14/2022).    Alric Ran, MD 07/19/2021, 12:43 PM  [facility] [address] [place],  [address] [phone]

## 2021-07-21 ENCOUNTER — Ambulatory Visit
Admission: RE | Admit: 2021-07-21 | Discharge: 2021-07-21 | Disposition: A | Discharge status: Home / Self Care | Payer: Medicare Other | Source: Ambulatory Visit | Attending: Internal Medicine | Admitting: Internal Medicine

## 2021-07-21 ENCOUNTER — Other Ambulatory Visit: Payer: Self-pay | Admitting: Internal Medicine

## 2021-07-21 DIAGNOSIS — R112 Nausea with vomiting, unspecified: Secondary | ICD-10-CM

## 2021-07-21 DIAGNOSIS — R059 Cough, unspecified: Secondary | ICD-10-CM

## 2021-07-22 LAB — LIPID PANEL
Chol/HDL Ratio: 3.4 ratio (ref 0.0–4.4)
Cholesterol, Total: 201 mg/dL — ABNORMAL HIGH (ref 100–199)
HDL: 59 mg/dL (ref >39)
LDL Chol Calc (NIH): 124 mg/dL — ABNORMAL HIGH (ref 0–99)
Triglycerides: 101 mg/dL (ref 0–149)
VLDL Cholesterol Cal: 18 mg/dL (ref 5–40)

## 2021-07-22 LAB — VITAMIN D 25 HYDROXY (VIT D DEFICIENCY, FRACTURES): Vit D, 25-Hydroxy: 45.3 ng/mL (ref 30.0–100.0)

## 2021-07-22 LAB — LACOSAMIDE: Lacosamide: 13.5 ug/mL — ABNORMAL HIGH (ref 5.0–10.0)

## 2021-07-22 LAB — LEVETIRACETAM LEVEL: Levetiracetam Lvl: 63.4 ug/mL — ABNORMAL HIGH (ref 10.0–40.0)

## 2021-07-23 ENCOUNTER — Telehealth: Payer: Self-pay | Admitting: Neurology

## 2021-07-23 MED ORDER — ATORVASTATIN CALCIUM 40 MG PO TABS: 40.0000 mg | ORAL_TABLET | Freq: Every evening | ORAL | 4 refills | Status: DC

## 2021-07-23 MED ORDER — LEVETIRACETAM 100 MG/ML PO SOLN: 750.0000 mg | Freq: Two times a day (BID) | ORAL | 1 refills | Status: DC

## 2021-07-23 NOTE — Telephone Encounter (Signed)
Spoke with niece Jonelle Sidle, informed her of the antiseizure medication level, both Keppra and Vimpat elevated 63.4 and 13.5 respectively.  I informed Tiffany my plan to decrease the Keppra from 1000 mg twice a day to 750 mg twice a Day.  We will keep the Vimpat at the same level and continue all other medication. Her lipid panel showed a total cholesterol of 201 and LDL of 124, I will also start her on a statin.  All other question answered.  I will see the as scheduled for follow-up.  Alric Ran, MD

## 2021-08-06 ENCOUNTER — Other Ambulatory Visit: Payer: Self-pay | Admitting: Neurology

## 2021-08-06 MED ORDER — LACOSAMIDE 200 MG PO TABS: 200.0000 mg | ORAL_TABLET | Freq: Two times a day (BID) | ORAL | 5 refills | Status: DC

## 2021-08-06 MED ORDER — METHYLPHENIDATE HCL 5 MG PO TABS: 5.0000 mg | ORAL_TABLET | Freq: Two times a day (BID) | ORAL | 0 refills | Status: DC

## 2021-08-06 NOTE — Telephone Encounter (Signed)
I returned the call to the patient's niece. She will need a refill of lacosamide 200mg , one tab BID. Reports the patient is taking methylphenidate 5mg , one tab at breakfast and tone tab at lunch to improve daytime wakefulness.  Per last office notes, she is to continue lacosamide. We will ask Dr. April Manson to review chart to see if he is willing to manage methylphenidate.   [place] narcotic registry checked for both medications. Last filled for #30 on 07/08/21.

## 2021-08-06 NOTE — Telephone Encounter (Signed)
Pt's Olivia Shah (on DPR) called, she needs refill for lacosamide (VIMPAT) 200 MG TABS tablet and methylphenidate (RITALIN) 5 MG tablet. She was discharged from the hospital with a 30 day supply. Her PCP said he could not refill these medications because need to have refilled by the neurologist.  Can send prescription to [facility]. If have any questions can call back.

## 2021-08-17 ENCOUNTER — Telehealth: Payer: Self-pay | Admitting: Neurology

## 2021-08-17 DIAGNOSIS — G40909 Epilepsy, unspecified, not intractable, without status epilepticus: Secondary | ICD-10-CM

## 2021-08-17 MED ORDER — LEVETIRACETAM 100 MG/ML PO SOLN: 500.0000 mg | Freq: Two times a day (BID) | ORAL | 1 refills | Status: DC

## 2021-08-17 NOTE — Telephone Encounter (Addendum)
Spoke to pt's sister, states for the last 2-3 weeks pt has experience Confusion, Itching, Hallucinations, Night terrors, and Leg weakness. She feels they are side effects of the keppra, she notices pt starts feeling itching herself right after taking medication.

## 2021-08-17 NOTE — Telephone Encounter (Signed)
Pt's sister called states pt is not tolerating the levETIRAcetam (KEPPRA) 100 MG/ML solution. Her side affects are very bad. Pt's sister requesting a call back.

## 2021-08-17 NOTE — Telephone Encounter (Signed)
Spoke with sister, she stated for the past 2 weeks patient having hallucinations described as seeing people in her room, getting worse, worsening itching, coughing and weakness in the legs.  She believes that these are side effect of the Keppra.  I did lower her Keppra from 1000 mg twice daily to 750 mg twice daily due to Keppra level being 63.4 a month ago.  At that time, I also started her on atorvastatin 40 mg daily.  I have advised sister to decrease the Keppra to 500 mg twice daily and also to discontinue the atorvastatin.  I will obtain a 24-hour EEG to rule out Maji to rule out nocturnal seizures.  Alric Ran, MD

## 2021-08-17 NOTE — Telephone Encounter (Signed)
Spoke with sister, will decrease Keppra to 500 mg BID, discontinue Lipitor and obtain a 24 hrs amb EEG.

## 2021-08-24 NOTE — Telephone Encounter (Signed)
Pt's Olivia Shah (on DPR) called, would like a call from the nurse to discuss changing seizure medication. levETIRAcetam (KEPPRA) 100 MG/ML solutio has too many side effects; hallucination, itchy, leg weakness, vomiting.

## 2021-08-25 ENCOUNTER — Telehealth: Payer: Self-pay | Admitting: Neurology

## 2021-08-25 NOTE — Telephone Encounter (Signed)
Pt returned phone call, would like a call back.  

## 2021-08-25 NOTE — Telephone Encounter (Signed)
08/25/2021 1210: Left a message for niece regarding adverse effects of Keppra. Will back another time.   Alric Ran, MD

## 2021-08-26 ENCOUNTER — Telehealth: Payer: Self-pay | Admitting: Neurology

## 2021-08-26 NOTE — Telephone Encounter (Signed)
I called back to review the plan. Dr. April Manson has already spoken with her. She did not have any further questions.

## 2021-08-26 NOTE — Telephone Encounter (Signed)
Spoke with niece Olivia Shah, reported patient is still having hallucinations, trying to get out of her bed, seeing people in her room while we decrease the Keppra to 500 mg twice daily.  It seems like family has stopped the medication for a couple nights and she did not have any of this behavior.  They believe it is secondary to [place] and would like to discontinue it.  Currently she is on Vimpat 200 mg twice daily.  I will go ahead and discontinue Keppra but I advised family to continue monitoring her in the event she has another seizure.  If she has another seizure I will add Depakote on a regimen.  I also informed him if hallucinations continue despite patient being off of levetiracetam to give me a call and an update me.  She is pending a 24-hour ambulatory EEG.   Alric Ran, MD

## 2021-08-26 NOTE — Telephone Encounter (Signed)
Will discontinue Keppra and continue Vimpat.

## 2021-08-27 ENCOUNTER — Telehealth (HOSPITAL_COMMUNITY): Payer: Self-pay

## 2021-08-27 NOTE — Telephone Encounter (Signed)
Attempted to contact patient to schedule OP MBS - left voicemail. ?

## 2021-08-28 ENCOUNTER — Encounter
Payer: Medicare Other | Attending: Physical Medicine and Rehabilitation | Admitting: Physical Medicine and Rehabilitation

## 2021-09-04 ENCOUNTER — Other Ambulatory Visit (HOSPITAL_COMMUNITY): Payer: Self-pay

## 2021-09-04 DIAGNOSIS — R1310 Dysphagia, unspecified: Secondary | ICD-10-CM

## 2021-09-04 DIAGNOSIS — R059 Cough, unspecified: Secondary | ICD-10-CM

## 2021-09-07 ENCOUNTER — Other Ambulatory Visit: Payer: Self-pay | Admitting: Neurology

## 2021-09-07 MED ORDER — METHYLPHENIDATE HCL 5 MG PO TABS: 5.0000 mg | ORAL_TABLET | Freq: Two times a day (BID) | ORAL | 0 refills | Status: DC

## 2021-09-07 NOTE — Telephone Encounter (Signed)
Pt's niece Jonelle Sidle called requesting refill for the methylphenidate (RITALIN) 5 MG tablet. Pharmacy [place], [place] - [address]

## 2021-09-16 ENCOUNTER — Ambulatory Visit (HOSPITAL_COMMUNITY)
Admission: RE | Admit: 2021-09-16 | Discharge: 2021-09-16 | Disposition: A | Discharge status: Home / Self Care | Payer: Medicare Other | Source: Ambulatory Visit | Attending: Internal Medicine | Admitting: Internal Medicine

## 2021-09-16 ENCOUNTER — Other Ambulatory Visit: Payer: Self-pay

## 2021-09-16 DIAGNOSIS — R1310 Dysphagia, unspecified: Secondary | ICD-10-CM | POA: Diagnosis present

## 2021-09-16 DIAGNOSIS — R059 Cough, unspecified: Secondary | ICD-10-CM | POA: Insufficient documentation

## 2021-09-16 NOTE — Progress Notes (Signed)
Modified Barium Swallow Progress Note  Patient Details  Name: Olivia Shah MRN: [id number] Date of Birth: [birth year]  Today's Date: 09/16/2021  Modified Barium Swallow completed.  Full report located under Chart Review in the Imaging Section.  Brief recommendations include the following:  Clinical Impression  Pt presents wtih a functional oropharyngeal swallow. She does often use two swallows to clear her oral cavity, but she does so spontaneously and fairly swiftly. She protects her airway well even while challenged with no aspiration that occurs. Recommend that she continue with regular solids and thin liquids. Suggested that she try elevating her head for sleeping to see if this decreases symptoms, although she denies any symptoms of reflux/heartburn. SLP f/u for swallowing not indicated at this time, but would continue ongoing SLP services that she says she is receiving for other cognitive-linguistic needs.   Swallow Evaluation Recommendations   Recommended Consults: Consider esophageal assessment   SLP Diet Recommendations: Regular solids;Thin liquid   Liquid Administration via: Cup;Straw   Medication Administration: Whole meds with liquid   Supervision: Patient able to self feed   Compensations: Slow rate;Small sips/bites   Postural Changes: Seated upright at 90 degrees;Remain semi-upright after after feeds/meals (Comment)   Oral Care Recommendations: Oral care BID        Osie Bond., M.A. [facility] Pager [phone] Office [phone]  09/16/2021,3:05 PM

## 2021-12-01 DIAGNOSIS — R053 Chronic cough: Secondary | ICD-10-CM | POA: Insufficient documentation

## 2022-01-14 ENCOUNTER — Encounter: Payer: Self-pay | Admitting: Neurology

## 2022-01-14 ENCOUNTER — Ambulatory Visit (INDEPENDENT_AMBULATORY_CARE_PROVIDER_SITE_OTHER): Payer: Medicare Other | Admitting: Neurology

## 2022-01-14 VITALS — BP 158/90 | HR 62 | Ht 62.0 in | Wt 175.0 lb

## 2022-01-14 DIAGNOSIS — I10 Essential (primary) hypertension: Secondary | ICD-10-CM | POA: Insufficient documentation

## 2022-01-14 DIAGNOSIS — G40909 Epilepsy, unspecified, not intractable, without status epilepticus: Secondary | ICD-10-CM

## 2022-01-14 DIAGNOSIS — G959 Disease of spinal cord, unspecified: Secondary | ICD-10-CM | POA: Insufficient documentation

## 2022-01-14 DIAGNOSIS — E1165 Type 2 diabetes mellitus with hyperglycemia: Secondary | ICD-10-CM | POA: Insufficient documentation

## 2022-01-14 DIAGNOSIS — D496 Neoplasm of unspecified behavior of brain: Secondary | ICD-10-CM | POA: Diagnosis not present

## 2022-01-14 DIAGNOSIS — E782 Mixed hyperlipidemia: Secondary | ICD-10-CM | POA: Insufficient documentation

## 2022-01-14 DIAGNOSIS — R1310 Dysphagia, unspecified: Secondary | ICD-10-CM | POA: Insufficient documentation

## 2022-01-14 DIAGNOSIS — R9389 Abnormal findings on diagnostic imaging of other specified body structures: Secondary | ICD-10-CM | POA: Insufficient documentation

## 2022-01-14 DIAGNOSIS — Z9889 Other specified postprocedural states: Secondary | ICD-10-CM

## 2022-01-14 DIAGNOSIS — E7800 Pure hypercholesterolemia, unspecified: Secondary | ICD-10-CM | POA: Insufficient documentation

## 2022-01-14 DIAGNOSIS — G5710 Meralgia paresthetica, unspecified lower limb: Secondary | ICD-10-CM | POA: Insufficient documentation

## 2022-01-14 DIAGNOSIS — G8929 Other chronic pain: Secondary | ICD-10-CM | POA: Insufficient documentation

## 2022-01-14 DIAGNOSIS — E663 Overweight: Secondary | ICD-10-CM | POA: Insufficient documentation

## 2022-01-14 DIAGNOSIS — Z86018 Personal history of other benign neoplasm: Secondary | ICD-10-CM | POA: Diagnosis not present

## 2022-01-14 DIAGNOSIS — Z800 Family history of malignant neoplasm of digestive organs: Secondary | ICD-10-CM | POA: Insufficient documentation

## 2022-01-14 DIAGNOSIS — Z1211 Encounter for screening for malignant neoplasm of colon: Secondary | ICD-10-CM | POA: Insufficient documentation

## 2022-01-14 NOTE — Progress Notes (Signed)
? ?[facility] ? ?PATIENT: Olivia Shah ?DOB: 15-Mar-1956 ? ?REFERRING CLINICIAN: Seward Carol, MD ?HISTORY FROM: Patient, sister ?REASON FOR VISIT: New onset seizure s/p meningioma resection  ? ? ?HISTORICAL ? ?CHIEF COMPLAINT:  ?Chief Complaint  ?Patient presents with  ? Follow-up  ?  Rm 15. Accompanied by sister. ?Denies any new seizure activity. Discuss lacosamide.  ? ? ?INTERVAL HISTORY 01/14/2022 ?Patient presents today for follow up. She is accompanied by her sister. At last visit, plan was to continue with Keppra and Vimpat, she could not tolerate Keppra and was experiencing hallucinations. Therefore Keppra discontinued and and she continued on Vimpat 200 mg BID. Since discontinuing the Keppra, no additional hallucinations, doing well on Vimpat, no additional seizures, no other side effects.  ? 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  She underwent a total gross resection on August 22 and postop course complicated by focal status epilepticus requiring multiple anti seizure medications.  She was finally extubated, her ASM regimen was simplified to Keppra 1000 mg twice daily and lacosamide 200 mg twice daily.  She did have a follow-up MRI which showed incidental right hemispheric strokes and also in the right caudate.  She did have a long rehab course.  She was finally discharged home October 12.  She was not noted to have any seizures while in rehab.  Per family she continued to have cognitive issues but has improved.  She continues with physical therapy and Occupational Therapy.  She also have improvement in her swallowing status. ?In terms of seizures she denies any past medical history of seizures, denies any family history of seizures, denies any  seizure risk factor, seizure started status post meningioma resection.  She still need assistance from transferring from sitting to standing and from laying down to sitting up.  She also needs assistance with using the bathroom. ? ?Handedness: Left handed  ? ?Seizure Type: right focal seizure ? ?Current frequency: None since being in rehab  ? ?Any injuries from seizures: None  ? ?Seizure risk factors: Meningioma resection  ? ?Previous ASMs: Levetiracetam, Lacosamide, Phenytoin, Phenobarb, Propofol  ? ?Currenty ASMs: Lacosamide  ? ?ASMs side effects: Hallucination on Levetiracetam  ? ?Brain Images: Skull based meningioma (04/21/2021)  ? ?Previous EEGs: Status epilepticus  (05/21/2021) ? ? ?OTHER MEDICAL CONDITIONS: Diabetes mellitus type 2, hypertension, congenital right eye blindness.  ? ?REVIEW OF SYSTEMS: Full 14 system review of systems performed and negative with exception of: As noted in the HPI ? ?ALLERGIES: ?Allergies  ?Allergen Reactions  ? Cashew Nut (Anacardium Occidentale) Skin Test Hives  ?  Hives to multiple nuts  ? Lisinopril Cough  ? Shellfish Allergy Hives  ? ? ?HOME MEDICATIONS: ?Outpatient Medications Prior to Visit  ?Medication Sig Dispense Refill  ? acetaminophen (TYLENOL) 325 MG tablet Take 1-2 tablets (325-650 mg total) by mouth every 4 (four) hours as needed for mild pain.    ? Ascorbic Acid (VITAMIN C) 1000 MG tablet Take 1,000 mg by mouth daily.    ? aspirin EC 81 MG tablet Take 81 mg by mouth daily.    ? ATORVASTATIN CALCIUM PO Take by mouth.    ? dorzolamide-timolol (COSOPT) 22.3-6.8 MG/ML ophthalmic solution Place 1 drop into the left eye 2 (two) times daily.    ?  famotidine (PEPCID) 20 MG tablet Take 1 tablet (20 mg total) by mouth 2 (two) times daily. 60 tablet 0  ? hydrochlorothiazide (HYDRODIURIL) 12.5 MG tablet Take 1 tablet (12.5 mg total) by mouth daily. 30 tablet 0  ? lacosamide (VIMPAT) 200 MG TABS tablet Take 1 tablet (200 mg total) by mouth 2 (two) times daily. 60 tablet 5  ?  latanoprost (XALATAN) 0.005 % ophthalmic solution Place 1 drop into the left eye at bedtime.    ? losartan (COZAAR) 25 MG tablet Take 1/2 tablet (12.5 mg total) by mouth daily. 15 tablet 0  ? magnesium oxide (MAG-OX) 400 MG tablet Take 1/2 tablet (200 mg total) by mouth at bedtime. 15 tablet 0  ? metFORMIN (GLUCOPHAGE) 500 MG tablet Take 2 tablets (1,000 mg total) by mouth daily with breakfast AND 1 tablet (500 mg total) daily with supper. 90 tablet 0  ? methylphenidate (RITALIN) 5 MG tablet Take 1 tablet (5 mg total) by mouth 2 (two) times daily with breakfast and lunch. 60 tablet 0  ? metoprolol tartrate (LOPRESSOR) 100 MG tablet Take 2 tablets (200 mg total) by mouth at bedtime. 60 tablet 0  ? Multiple Vitamin (MULTIVITAMIN WITH MINERALS) TABS tablet Take 1 tablet by mouth daily.    ? polyethylene glycol powder (GLYCOLAX/MIRALAX) 17 GM/SCOOP powder Take 17 g by mouth daily. 238 g 0  ? potassium chloride (KLOR-CON) 10 MEQ tablet Take 2 tablets (20 mEq total) by mouth in the morning and at supper. 120 tablet 0  ? saccharomyces boulardii (FLORASTOR) 250 MG capsule Take 1 capsule (250 mg total) by mouth 2 (two) times daily. 60 capsule 0  ? vitamin E 180 MG (400 UNITS) capsule Take 1 capsule (400 Units total) by mouth daily. 30 capsule 0  ? ?No facility-administered medications prior to visit.  ? ? ?PAST MEDICAL HISTORY: ?Past Medical History:  ?Diagnosis Date  ? Arthritis   ? Diabetes mellitus without complication ([place])   ? Hypertension   ? ? ?PAST SURGICAL HISTORY: ?Past Surgical History:  ?Procedure Laterality Date  ? APPLICATION OF CRANIAL NAVIGATION N/A 05/18/2021  ? Procedure: APPLICATION OF CRANIAL NAVIGATION;  Surgeon: Judith Part, MD;  Location: [place];  Service: Neurosurgery;  Laterality: N/A;  ? BREAST BIOPSY Right   ? CHOLECYSTECTOMY    ? CRANIOTOMY Right 05/18/2021  ? Procedure: Right craniotomy for tumor resection;  Surgeon: Judith Part, MD;  Location: [place];  Service: Neurosurgery;   Laterality: Right;  ? EYE SURGERY    ? as a child  ? HERNIA REPAIR    ? ? ?FAMILY HISTORY: ?Family History  ?Problem Relation Age of Onset  ? Breast cancer Neg Hx   ? ? ?SOCIAL HISTORY: ?Social History  ? ?Socioeconomic History  ? Marital status: Single  ?  Spouse name: Not on file  ? Number of children: Not on file  ? Years of education: Not on file  ? Highest education level: Not on file  ?Occupational History  ? Not on file  ?Tobacco Use  ? Smoking status: Never  ? Smokeless tobacco: Never  ?Substance and Sexual Activity  ? Alcohol use: Not on file  ? Drug use: Not on file  ? Sexual activity: Not on file  ?Other Topics Concern  ? Not on file  ?Social History Narrative  ? Not on file  ? ?Social Determinants of Health  ? ?Financial Resource Strain: Not on file  ?Food Insecurity: Not on file  ?Transportation Needs: Not on file  ?  Physical Activity: Not on file  ?Stress: Not on file  ?Social Connections: Not on file  ?Intimate Partner Violence: Not on file  ? ? ? ?PHYSICAL EXAM ?GENERAL EXAM/CONSTITUTIONAL: ?Vitals:  ?Vitals:  ? 01/14/22 1135 01/14/22 1137 01/14/22 1144  ?BP: (!) 181/99 (!) 193/107 (!) 158/90  ?Pulse: 62 64 62  ?Weight: 175 lb (79.4 kg)    ?Height: 5' 2"  (1.575 m)    ? ?Body mass index is 32.01 kg/m?. ?Wt Readings from Last 3 Encounters:  ?01/14/22 175 lb (79.4 kg)  ?07/07/21 176 lb 5.9 oz (80 kg)  ?06/04/21 194 lb 10.7 oz (88.3 kg)  ? ?Patient is in no distress; well developed, nourished and groomed; neck is supple ? ?EYES: ?Pupils round and reactive to light, Loss of vision with the right eye and decrease peripheral vision with the left. ?No results found. ? ?MUSCULOSKELETAL: ?Gait, strength, tone, movements noted in Neurologic exam below ? ?NEUROLOGIC: ?MENTAL STATUS:  ?   ? View : No data to display.  ?  ?  ?  ? ?awake, alert, oriented to person, place  ? ?language fluent, normal comprehension and attention ? ?CRANIAL NERVE:  ? ?2nd, 3rd, 4th, 6th - left pupil is reactive to light, she has loss of  peripheral vision with the left eye and no vision perception with the right.  ? ? ?MOTOR:  ?At least antigravity.  ? ? ?GAIT/STATION:  ?Walks with a walker  ? ? ? ?DIAGNOSTIC DATA (LABS, IMAGING, TESTING) ?- I

## 2022-01-15 NOTE — Patient Instructions (Addendum)
Continue with Vimpat 200 mg BID  ?Follow up in a year  ? ?

## 2022-02-06 ENCOUNTER — Other Ambulatory Visit: Payer: Self-pay | Admitting: Neurology

## 2022-02-08 NOTE — Telephone Encounter (Signed)
Verify Drug Registry For Lacosamide 200 Mg Tablet ?Last Filled: 01/06/2022 ?Quantity: 60 tablets for 30 days ?Last appointment: 01/14/2022 ?Next appointment: 01/17/2023 ?

## 2022-02-11 ENCOUNTER — Other Ambulatory Visit: Payer: Self-pay | Admitting: Internal Medicine

## 2022-02-11 DIAGNOSIS — Z1231 Encounter for screening mammogram for malignant neoplasm of breast: Secondary | ICD-10-CM

## 2022-02-25 ENCOUNTER — Ambulatory Visit
Admission: RE | Admit: 2022-02-25 | Discharge: 2022-02-25 | Disposition: A | Discharge status: Home / Self Care | Payer: Medicare Other | Source: Ambulatory Visit | Attending: Internal Medicine | Admitting: Internal Medicine

## 2022-02-25 DIAGNOSIS — Z1231 Encounter for screening mammogram for malignant neoplasm of breast: Secondary | ICD-10-CM

## 2022-03-16 ENCOUNTER — Other Ambulatory Visit: Payer: Self-pay | Admitting: Neurological Surgery

## 2022-03-16 ENCOUNTER — Other Ambulatory Visit (HOSPITAL_COMMUNITY): Payer: Self-pay | Admitting: Neurological Surgery

## 2022-03-16 DIAGNOSIS — D320 Benign neoplasm of cerebral meninges: Secondary | ICD-10-CM

## 2022-03-18 ENCOUNTER — Telehealth: Payer: Self-pay | Admitting: Neurology

## 2022-03-18 NOTE — Telephone Encounter (Signed)
I returned the call to the patient. Reports over the last two weeks, she has experienced four events of mild tongue numbness, lasting 5 or less minutes. Each time it started after her breakfast and morning medications. No other symptoms reported. Last time was this morning. Her PCP instructed her to report it to our office.  She is under our care for seizure disorder. Taking lacosamide '200mg'$ , one tab BID.

## 2022-03-18 NOTE — Telephone Encounter (Signed)
Pt has called to report that her PCP suggested she calls to notify Dr April Manson that for about 2 weeks in the morning pt experiences a light(not painful) numbing in her tongue(in front or on right side) for 5-10 mins after she has breakfast, please call to suggest

## 2022-04-03 ENCOUNTER — Ambulatory Visit (HOSPITAL_COMMUNITY)
Admission: RE | Admit: 2022-04-03 | Discharge: 2022-04-03 | Disposition: A | Discharge status: Home / Self Care | Payer: Medicare Other | Source: Ambulatory Visit | Attending: Neurological Surgery | Admitting: Neurological Surgery

## 2022-04-03 DIAGNOSIS — D320 Benign neoplasm of cerebral meninges: Secondary | ICD-10-CM | POA: Diagnosis not present

## 2022-04-03 MED ORDER — GADOBUTROL 1 MMOL/ML IV SOLN
7.9000 mL | Freq: Once | INTRAVENOUS | Status: AC | PRN
  Administered 2022-04-03: 7.9 mL via INTRAVENOUS

## 2022-06-28 IMAGING — RF DG SWALLOWING FUNCTION
12 of 24 series · 12 of 24 positions shown · non-contrast
Comparison: None.

CLINICAL DATA: Dysphagia. Cough/GE reflux disease/other secondary
diagnosis

EXAM:
MODIFIED BARIUM SWALLOW
TECHNIQUE: Different consistencies of barium were administered orally to the
patient by the Speech Pathologist. Imaging of the pharynx was
performed in the lateral projection. The physician extender was
present in the fluoroscopy room for this study, providing personal
supervision.
FLUOROSCOPY TIME:  Fluoroscopy Time:  2 minutes
Radiation Exposure Index (if provided by the fluoroscopic device):
18 mGy
Number of Acquired Spot Images: 0

[Series 2: run · 1 of 11 frames shown (1 of 12)]
[frame 2/11]
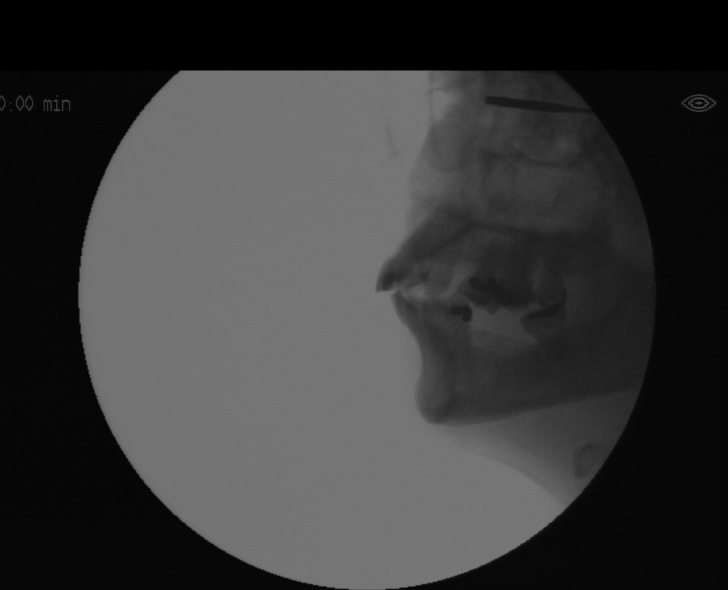

[Series 4: run · 1 of 83 frames shown (2 of 12)]
[frame 1/83]
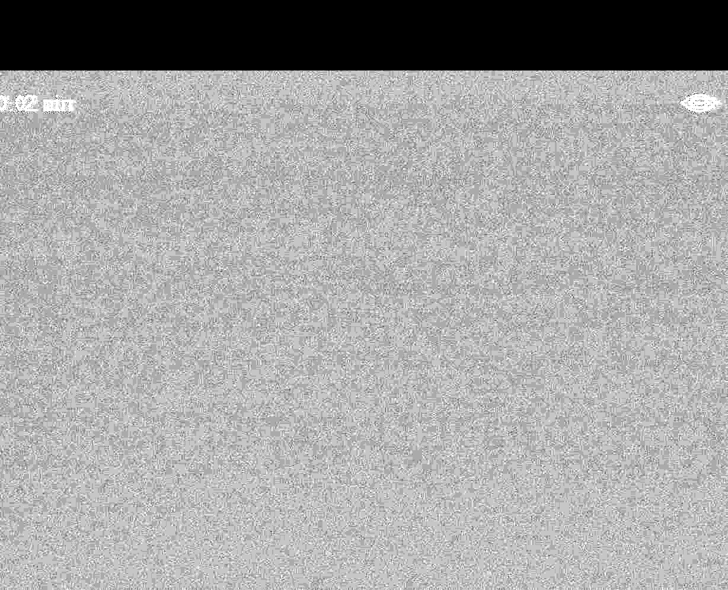

[Series 6: run · 1 of 129 frames shown (3 of 12)]
[frame 65/129]
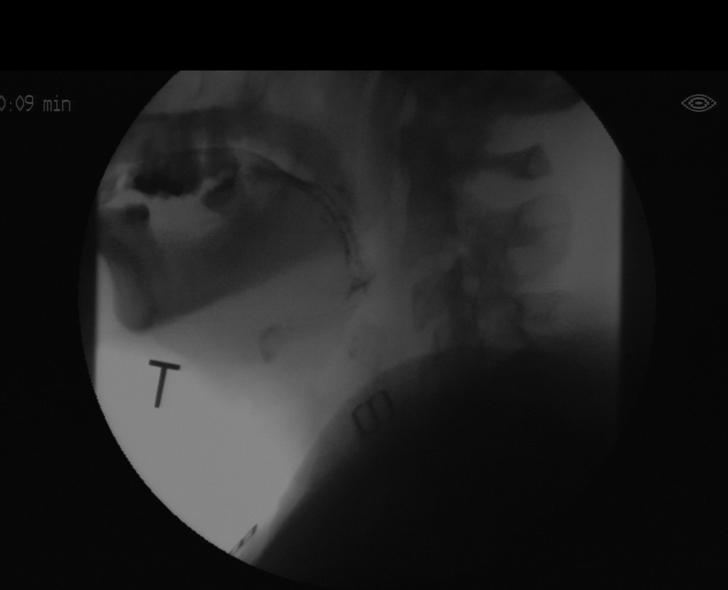

[Series 8: run · 1 of 136 frames shown (4 of 12)]
[frame 69/136]
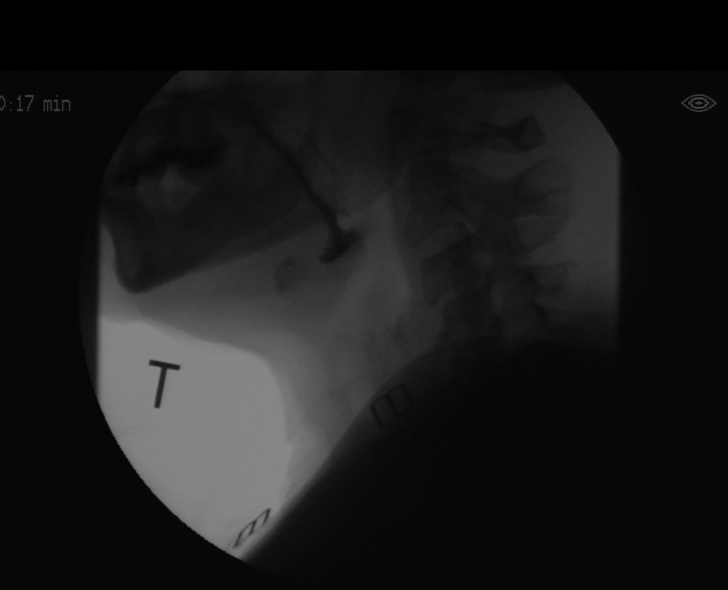

[Series 10: run · 1 of 150 frames shown (5 of 12)]
[frame 35/150]
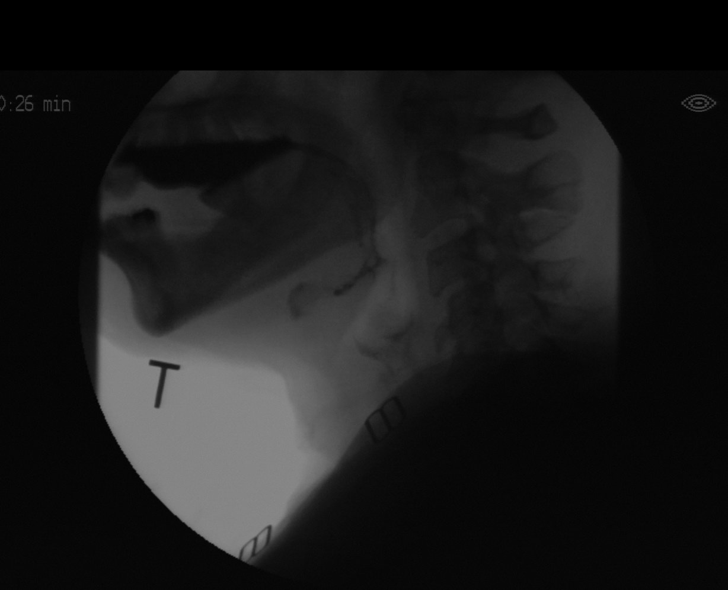

[Series 12: run · 1 of 135 frames shown (6 of 12)]
[frame 35/135]
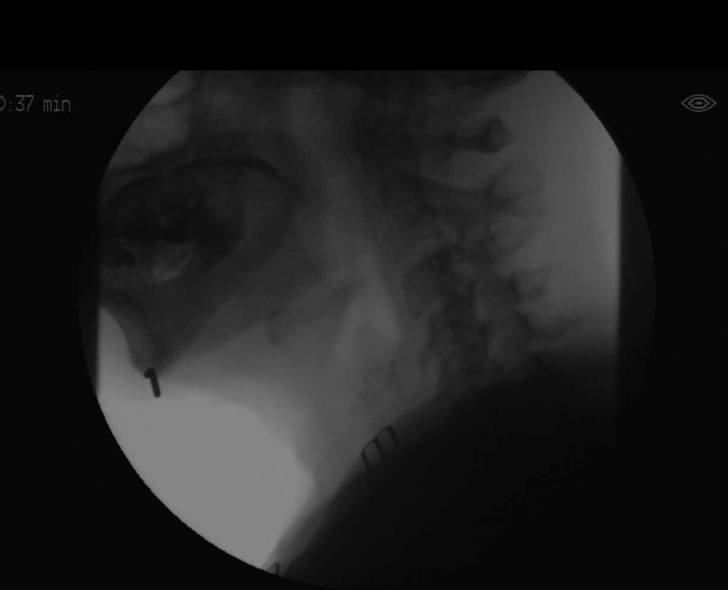

[Series 14: run · 1 of 152 frames shown (7 of 12)]
[frame 77/152]
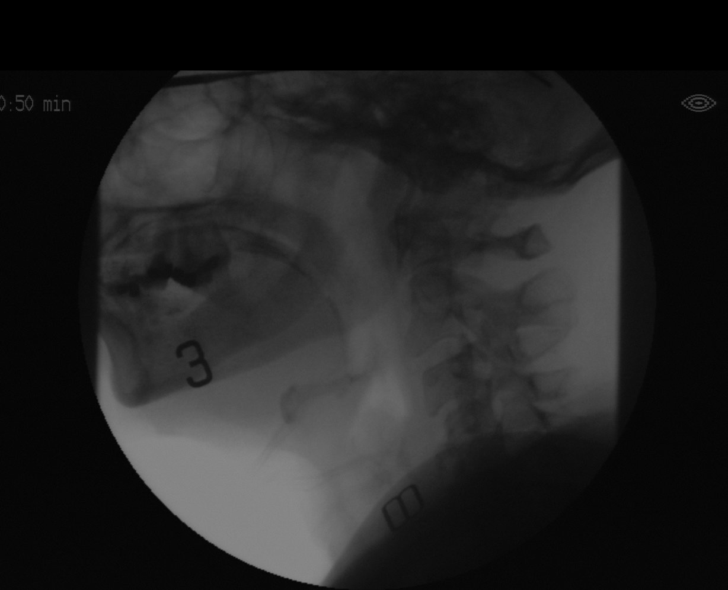

[Series 16: run · 1 of 140 frames shown (8 of 12)]
[frame 71/140]
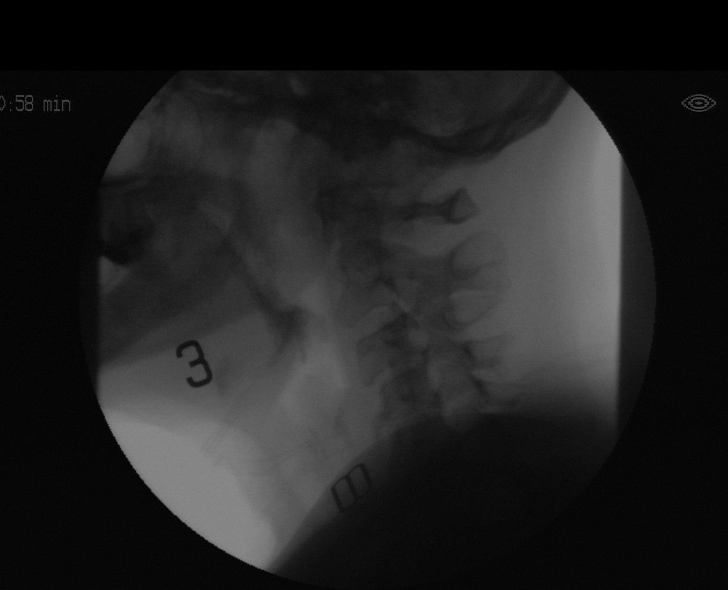

[Series 18: run · 1 of 98 frames shown (9 of 12)]
[frame 50/98]
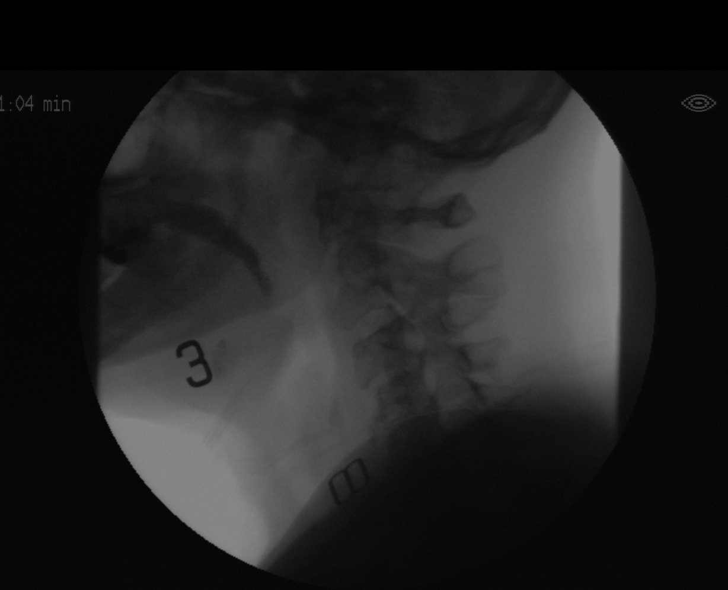

[Series 20: run · 1 of 388 frames shown (10 of 12)]
[frame 195/388]
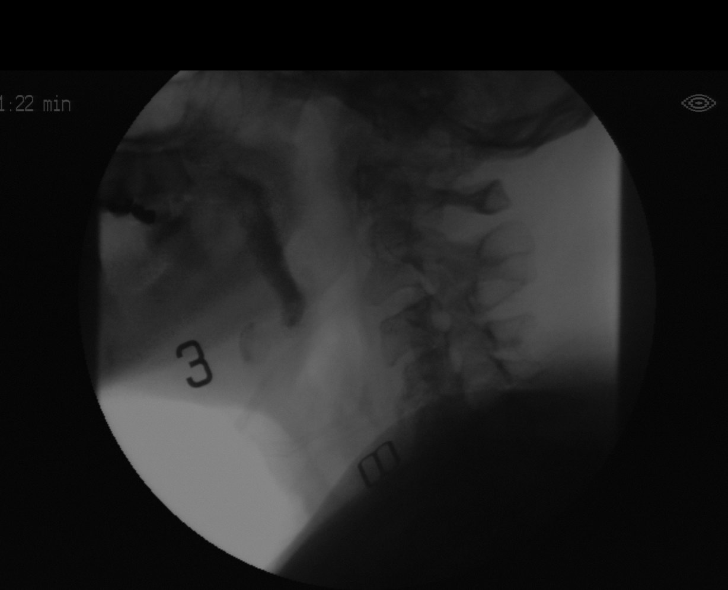

[Series 22: run · 1 of 47 frames shown (11 of 12)]
[frame 40/47]
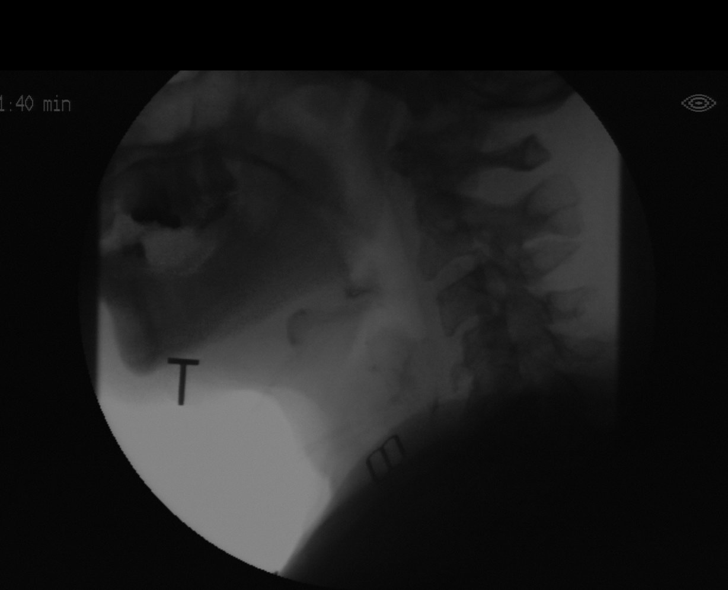

[Series 24: run · 1 of 626 frames shown (12 of 12)]
[frame 533/626]
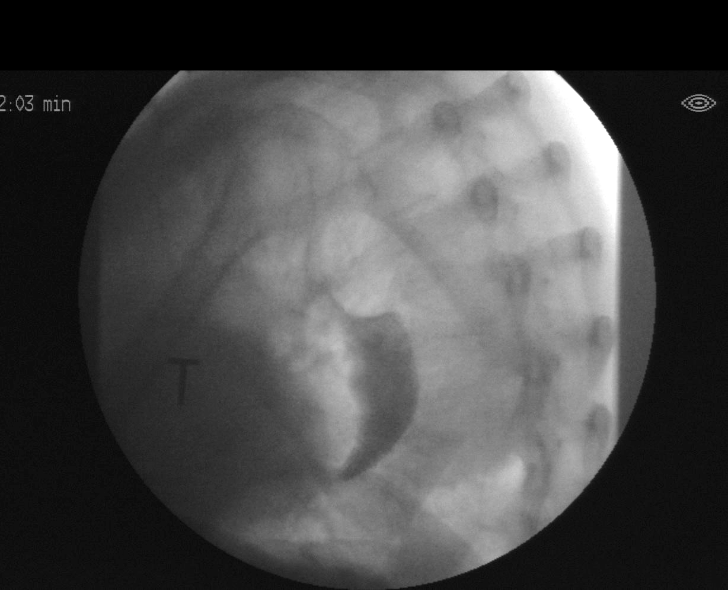

[12 of 24 positions shown; findings below may reference images not displayed]

FINDINGS: No laryngeal penetration or tracheal aspiration.
IMPRESSION: See speech pathology report for detail.

Please refer to the Speech Pathologists report for complete details
and recommendations.

## 2022-07-26 ENCOUNTER — Encounter: Payer: Self-pay | Admitting: Physical Therapy

## 2022-07-26 ENCOUNTER — Ambulatory Visit: Payer: Medicare Other | Attending: Internal Medicine | Admitting: Physical Therapy

## 2022-07-26 DIAGNOSIS — M5459 Other low back pain: Secondary | ICD-10-CM | POA: Diagnosis present

## 2022-07-26 DIAGNOSIS — R278 Other lack of coordination: Secondary | ICD-10-CM | POA: Insufficient documentation

## 2022-07-26 DIAGNOSIS — M6281 Muscle weakness (generalized): Secondary | ICD-10-CM | POA: Insufficient documentation

## 2022-07-26 DIAGNOSIS — R262 Difficulty in walking, not elsewhere classified: Secondary | ICD-10-CM | POA: Insufficient documentation

## 2022-07-26 DIAGNOSIS — R2681 Unsteadiness on feet: Secondary | ICD-10-CM | POA: Diagnosis present

## 2022-07-26 NOTE — Therapy (Signed)
OUTPATIENT PHYSICAL THERAPY THORACOLUMBAR EVALUATION   Patient Name: Olivia Shah MRN: [id number] DOB:[birth year], 66 y.o., female Today's Date: 07/26/2022   PT End of Session - 07/26/22 1450     Visit Number 1    Date for PT Re-Evaluation 10/18/22    PT Start Time 1230    PT Stop Time 5625    PT Time Calculation (min) 42 min    Activity Tolerance Patient tolerated treatment well    Behavior During Therapy [facility] for tasks assessed/performed             Past Medical History:  Diagnosis Date   Arthritis    Diabetes mellitus without complication ([place])    Hypertension    Past Surgical History:  Procedure Laterality Date   APPLICATION OF CRANIAL NAVIGATION N/A 05/18/2021   Procedure: APPLICATION OF CRANIAL NAVIGATION;  Surgeon: Judith Part, MD;  Location: [place];  Service: Neurosurgery;  Laterality: N/A;   BREAST BIOPSY Right    CHOLECYSTECTOMY     CRANIOTOMY Right 05/18/2021   Procedure: Right craniotomy for tumor resection;  Surgeon: Judith Part, MD;  Location: [place];  Service: Neurosurgery;  Laterality: Right;   EYE SURGERY     as a child   HERNIA REPAIR     Patient Active Problem List   Diagnosis Date Noted   Abnormal findings on diagnostic imaging of other specified body structures 01/14/2022   Chronic pain 01/14/2022   Colon cancer screening 01/14/2022   Dysphagia 01/14/2022   Family history of malignant neoplasm of digestive organs 01/14/2022   Hyperglycemia due to type 2 diabetes mellitus ([place]) 01/14/2022   Hypertension 01/14/2022   Meralgia paresthetica 01/14/2022   Mixed hyperlipidemia 01/14/2022   Morbid obesity ([place]) 01/14/2022   Overweight 01/14/2022   Pure hypercholesterolemia 01/14/2022   Spinal cord disease ([place]) 01/14/2022   Chronic cough 12/01/2021   Diabetes ([place]) 07/08/2021   Glaucoma 07/08/2021   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   Visual field defect of left eye 07/08/2021   Debility 06/16/2021   Hypokalemia    Seizures ([facility])     Benign essential HTN    Controlled type 2 diabetes mellitus with hyperglycemia, without long-term current use of insulin ([place])    Stridor 06/05/2021   Status post tracheostomy ([place])    Acute respiratory failure with hypoxia ([place])    Status epilepticus ([place])    Brain tumor ([place]) 05/18/2021    PCP: Seward Carol, MD   REFERRING PROVIDER: Seward Carol, MD   REFERRING DIAG: Diagnosis M48.061 (ICD-10-CM) - Spinal stenosis, lumbar region without neurogenic claudication   Rationale for Evaluation and Treatment: Rehabilitation  THERAPY DIAG:  Muscle weakness (generalized)  Difficulty in walking, not elsewhere classified  Unsteadiness on feet  Other lack of coordination  Other low back pain  ONSET DATE: 07/06/2022   SUBJECTIVE:  SUBJECTIVE STATEMENT: Patient reports posterior leg pain/stinging above the knee, she also has pain in her neck  PERTINENT HISTORY:  Meningioma, removed, R BG infarct, old  PAIN:  Are you having pain? Yes: NPRS scale: 5/10 Pain location: Posterior legs to knees Pain description: stinging Aggravating factors: walking Relieving factors: sitting  PRECAUTIONS: None  WEIGHT BEARING RESTRICTIONS: No  FALLS:  Has patient fallen in last 6 months? No  LIVING ENVIRONMENT: Lives with: lives alone Lives in: House/apartment Stairs: Yes: External: 2 steps; on right going up Has following equipment at home: Single point cane and Walker - 2 wheeled  OCCUPATION: Receptionist, sitting, full time  PLOF: Independent  PATIENT GOALS: Decrease pain, be able to move around without pain   OBJECTIVE:   DIAGNOSTIC FINDINGS:  Lumbar spinal stenosis  PATIENT SURVEYS:  FOTO 40.26  SCREENING FOR RED FLAGS: Bowel or bladder incontinence: No Spinal tumors: No Cauda equina  syndrome: No Compression fracture: No Abdominal aneurysm: No  COGNITION: Overall cognitive status: Within functional limits for tasks assessed    MUSCLE LENGTH: Hamstrings: Right 82 deg; Left 84 deg B hip flexors appear tight  POSTURE: increased lumbar lordosis, decreased thoracic kyphosis, right pelvic obliquity, and R shoulder elevated RLE longer than L 3/4"  PALPATION: N TTP in back or buttocks or legs  LUMBAR ROM:   AROM eval  Flexion WNL  Extension Severely limited  Right lateral flexion WNL  Left lateral flexion 3" above knee  Right rotation 30%  Left rotation 40%   (Blank rows = not tested)  LOWER EXTREMITY ROM:    WNL B  LOWER EXTREMITY MMT:    MMT Right eval Left eval  Hip flexion 3+ 3+  Hip extension 3- 3-  Hip abduction 3 3  Hip adduction    Hip internal rotation    Hip external rotation    Knee flexion 4- 4-  Knee extension 3+ 3+  Ankle dorsiflexion 4 4  Ankle plantarflexion    Ankle inversion    Ankle eversion     (Blank rows = not tested)  LUMBAR SPECIAL TESTS:  Straight leg raise test: Negative and Slump test: Negative  FUNCTIONAL TESTS:  5 times sit to stand: 19.26 Timed up and go (TUG): 14.50 Functional gait assessment: TBD  GAIT: Distance walked: 31'  Assistive device utilized: Single point cane and it was set too tall, but she declined to lower it. Level of assistance: Modified independence Comments: Patient declined to adjust cane height.  TODAY'S TREATMENT:                                                                                                                              DATE: 07/26/22 Education    PATIENT EDUCATION:  Education details: POC Person educated: Patient Education method: Explanation Education comprehension: verbalized understanding  HOME EXERCISE PROGRAM: TBD  ASSESSMENT:  CLINICAL IMPRESSION: Patient is a 66 y.o. who was seen today for physical therapy evaluation and treatment  for pain that  moves down her B posterior upper legs. She also reports some burning in shoulder-new onset. Walking aggravates the pain. She demonstrates weakness in trunk and LE, Leg length discrepancy, difficulty with transfers and ambulation, decreased balance, and as noted, pain. 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 She will benefit from PT to address her weakness to try to build more trunk and postural support and decrease her pain.  OBJECTIVE IMPAIRMENTS: Abnormal gait, decreased activity tolerance, decreased balance, decreased coordination, decreased endurance, decreased mobility, difficulty walking, decreased ROM, decreased strength, increased muscle spasms, impaired flexibility, improper body mechanics, postural dysfunction, and pain.   ACTIVITY LIMITATIONS: carrying, lifting, bending, standing, squatting, sleeping, stairs, transfers, and locomotion level  PARTICIPATION LIMITATIONS: meal prep, cleaning, laundry, interpersonal relationship, driving, shopping, and occupation  PERSONAL FACTORS: Age, Past/current experiences, and 1 comorbidity: Meningioma  are also affecting patient's functional outcome.   REHAB POTENTIAL: Good  CLINICAL DECISION MAKING: Evolving/moderate complexity  EVALUATION COMPLEXITY: Moderate   GOALS: Goals reviewed with patient? Yes  SHORT TERM GOALS: Target date: 08/23/2022  I with basic HEP Baseline: Goal status: INITIAL  LONG TERM GOALS: Target date: 10/18/2022  I with final HEP Baseline:  Goal status: INITIAL  2.  Increase FOTO score to at least 52 Baseline: 40 Goal status: INITIAL  3.  Decrease TUG14.5 time to < 12 Baseline:  Goal status: INITIAL  4.  Decrease 5 x STS to < 12 sec Baseline: 19 Goal status: INITIAL  5.  Patient will walk x at least 300' with LRAD, MI, no pain in legs Baseline:  Goal status: INITIAL  6.  Assess and update sitting posture in her workspace to provide adequate trunk support. Baseline:  Goal status:  INITIAL  PLAN:  PT FREQUENCY: 1-2x/week  PT DURATION: 12 weeks  PLANNED INTERVENTIONS: Therapeutic exercises, Therapeutic activity, Neuromuscular re-education, Balance training, Gait training, Patient/Family education, Self Care, Joint mobilization, Stair training, Dry Needling, Electrical stimulation, Cryotherapy, Moist heat, Ionotophoresis '4mg'$ /ml Dexamethasone, and Manual therapy.  PLAN FOR NEXT SESSION: FGA, HEP   Marcelina Morel, DPT 07/26/2022, 2:52 PM

## 2022-07-30 NOTE — Therapy (Signed)
OUTPATIENT PHYSICAL THERAPY THORACOLUMBAR TREATMENT   Patient Name: Olivia Shah MRN: [id number] DOB:05-27-1956, 66 y.o., female Today's Date: 08/02/2022   PT End of Session - 08/02/22 1444     Visit Number 2    Date for PT Re-Evaluation 10/18/22    PT Start Time 1400    PT Stop Time 1445    PT Time Calculation (min) 45 min    Activity Tolerance Patient tolerated treatment well    Behavior During Therapy [facility] for tasks assessed/performed             Past Medical History:  Diagnosis Date   Arthritis    Diabetes mellitus without complication ([place])    Hypertension    Past Surgical History:  Procedure Laterality Date   APPLICATION OF CRANIAL NAVIGATION N/A 05/18/2021   Procedure: APPLICATION OF CRANIAL NAVIGATION;  Surgeon: Judith Part, MD;  Location: [place];  Service: Neurosurgery;  Laterality: N/A;   BREAST BIOPSY Right    CHOLECYSTECTOMY     CRANIOTOMY Right 05/18/2021   Procedure: Right craniotomy for tumor resection;  Surgeon: Judith Part, MD;  Location: [place];  Service: Neurosurgery;  Laterality: Right;   EYE SURGERY     as a child   HERNIA REPAIR     Patient Active Problem List   Diagnosis Date Noted   Abnormal findings on diagnostic imaging of other specified body structures 01/14/2022   Chronic pain 01/14/2022   Colon cancer screening 01/14/2022   Dysphagia 01/14/2022   Family history of malignant neoplasm of digestive organs 01/14/2022   Hyperglycemia due to type 2 diabetes mellitus ([place]) 01/14/2022   Hypertension 01/14/2022   Meralgia paresthetica 01/14/2022   Mixed hyperlipidemia 01/14/2022   Morbid obesity ([place]) 01/14/2022   Overweight 01/14/2022   Pure hypercholesterolemia 01/14/2022   Spinal cord disease ([place]) 01/14/2022   Chronic cough 12/01/2021   Diabetes ([place]) 07/08/2021   Glaucoma 07/08/2021   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   Visual field defect of left eye 07/08/2021   Debility 06/16/2021   Hypokalemia    Seizures (HCC)     Benign essential HTN    Controlled type 2 diabetes mellitus with hyperglycemia, without long-term current use of insulin ([place])    Stridor 06/05/2021   Status post tracheostomy ([place])    Acute respiratory failure with hypoxia ([place])    Status epilepticus ([place])    Brain tumor ([place]) 05/18/2021    PCP: Seward Carol, MD   REFERRING PROVIDER: Seward Carol, MD   REFERRING DIAG: Diagnosis M48.061 (ICD-10-CM) - Spinal stenosis, lumbar region without neurogenic claudication   Rationale for Evaluation and Treatment: Rehabilitation  THERAPY DIAG:  Muscle weakness (generalized)  Difficulty in walking, not elsewhere classified  Other lack of coordination  Unsteadiness on feet  Other low back pain  ONSET DATE: 07/06/2022   SUBJECTIVE:  SUBJECTIVE STATEMENT: Having some back in the back of my legs above the knees  PERTINENT HISTORY:  Meningioma, removed, R BG infarct, old  PAIN:  Are you having pain? Yes: NPRS scale: 3/10 Pain location: Posterior legs to knees Pain description: stinging Aggravating factors: walking Relieving factors: sitting  PRECAUTIONS: None  WEIGHT BEARING RESTRICTIONS: No  FALLS:  Has patient fallen in last 6 months? No  LIVING ENVIRONMENT: Lives with: lives alone Lives in: House/apartment Stairs: Yes: External: 2 steps; on right going up Has following equipment at home: Single point cane and Walker - 2 wheeled  OCCUPATION: Receptionist, sitting, full time  PLOF: Independent  PATIENT GOALS: Decrease pain, be able to move around without pain   OBJECTIVE:   DIAGNOSTIC FINDINGS:  Lumbar spinal stenosis  PATIENT SURVEYS:  FOTO 40.26  SCREENING FOR RED FLAGS: Bowel or bladder incontinence: No Spinal tumors: No Cauda equina syndrome: No Compression fracture:  No Abdominal aneurysm: No  COGNITION: Overall cognitive status: Within functional limits for tasks assessed    MUSCLE LENGTH: Hamstrings: Right 82 deg; Left 84 deg B hip flexors appear tight  POSTURE: increased lumbar lordosis, decreased thoracic kyphosis, right pelvic obliquity, and R shoulder elevated RLE longer than L 3/4"  PALPATION: N TTP in back or buttocks or legs  LUMBAR ROM:   AROM eval  Flexion WNL  Extension Severely limited  Right lateral flexion WNL  Left lateral flexion 3" above knee  Right rotation 30%  Left rotation 40%   (Blank rows = not tested)  LOWER EXTREMITY ROM:    WNL B  LOWER EXTREMITY MMT:    MMT Right eval Left eval  Hip flexion 3+ 3+  Hip extension 3- 3-  Hip abduction 3 3  Hip adduction    Hip internal rotation    Hip external rotation    Knee flexion 4- 4-  Knee extension 3+ 3+  Ankle dorsiflexion 4 4  Ankle plantarflexion    Ankle inversion    Ankle eversion     (Blank rows = not tested)  LUMBAR SPECIAL TESTS:  Straight leg raise test: Negative and Slump test: Negative  FUNCTIONAL TESTS:  5 times sit to stand: 19.26 Timed up and go (TUG): 14.50 Functional gait assessment: TBD  GAIT: Distance walked: 57'  Assistive device utilized: Single point cane and it was set too tall, but she declined to lower it. Level of assistance: Modified independence Comments: Patient declined to adjust cane height.  TODAY'S TREATMENT:                                                                                                                              DATE:  08/02/22 NuStep L5x26mns S2S 2x10 4" heel taps SBA   LAQ 2x10  HS curls greenTB 2x10 Hip abd greenTB 2x10 seated 2x10 Rows and ext greenTB 2x10    PATIENT EDUCATION:  Education details: POC Person educated: Patient Education method: Explanation Education comprehension: verbalized understanding  HOME EXERCISE PROGRAM: TBD  ASSESSMENT:  CLINICAL IMPRESSION: Patient  returns with some pain in the back of her legs. We worked on some light strengthening to start gym activities. Worked mostly on LE strengthening. Tolerates session well without c/o of pain.   OBJECTIVE IMPAIRMENTS: Abnormal gait, decreased activity tolerance, decreased balance, decreased coordination, decreased endurance, decreased mobility, difficulty walking, decreased ROM, decreased strength, increased muscle spasms, impaired flexibility, improper body mechanics, postural dysfunction, and pain.   ACTIVITY LIMITATIONS: carrying, lifting, bending, standing, squatting, sleeping, stairs, transfers, and locomotion level  PARTICIPATION LIMITATIONS: meal prep, cleaning, laundry, interpersonal relationship, driving, shopping, and occupation  PERSONAL FACTORS: Age, Past/current experiences, and 1 comorbidity: Meningioma  are also affecting patient's functional outcome.   REHAB POTENTIAL: Good  CLINICAL DECISION MAKING: Evolving/moderate complexity  EVALUATION COMPLEXITY: Moderate   GOALS: Goals reviewed with patient? Yes  SHORT TERM GOALS: Target date: 08/23/2022  I with basic HEP Baseline: Goal status: INITIAL  LONG TERM GOALS: Target date: 10/18/2022  I with final HEP Baseline:  Goal status: INITIAL  2.  Increase FOTO score to at least 52 Baseline: 40 Goal status: INITIAL  3.  Decrease TUG14.5 time to < 12 Baseline:  Goal status: INITIAL  4.  Decrease 5 x STS to < 12 sec Baseline: 19 Goal status: INITIAL  5.  Patient will walk x at least 300' with LRAD, MI, no pain in legs Baseline:  Goal status: INITIAL  6.  Assess and update sitting posture in her workspace to provide adequate trunk support. Baseline:  Goal status: INITIAL  PLAN:  PT FREQUENCY: 1-2x/week  PT DURATION: 12 weeks  PLANNED INTERVENTIONS: Therapeutic exercises, Therapeutic activity, Neuromuscular re-education, Balance training, Gait training, Patient/Family education, Self Care, Joint mobilization,  Stair training, Dry Needling, Electrical stimulation, Cryotherapy, Moist heat, Ionotophoresis '4mg'$ /ml Dexamethasone, and Manual therapy.  PLAN FOR NEXT SESSION: FGA, HEP   Marcelina Morel, DPT 08/02/2022, 2:44 PM

## 2022-07-31 ENCOUNTER — Other Ambulatory Visit: Payer: Self-pay | Admitting: Neurology

## 2022-07-31 DIAGNOSIS — G40909 Epilepsy, unspecified, not intractable, without status epilepticus: Secondary | ICD-10-CM

## 2022-08-02 ENCOUNTER — Ambulatory Visit: Payer: Medicare Other | Attending: Internal Medicine

## 2022-08-02 DIAGNOSIS — R262 Difficulty in walking, not elsewhere classified: Secondary | ICD-10-CM | POA: Insufficient documentation

## 2022-08-02 DIAGNOSIS — R278 Other lack of coordination: Secondary | ICD-10-CM | POA: Diagnosis present

## 2022-08-02 DIAGNOSIS — R2681 Unsteadiness on feet: Secondary | ICD-10-CM | POA: Diagnosis present

## 2022-08-02 DIAGNOSIS — M6281 Muscle weakness (generalized): Secondary | ICD-10-CM | POA: Insufficient documentation

## 2022-08-02 DIAGNOSIS — M5459 Other low back pain: Secondary | ICD-10-CM | POA: Insufficient documentation

## 2022-08-05 ENCOUNTER — Ambulatory Visit: Payer: Medicare Other | Admitting: Physical Therapy

## 2022-08-05 DIAGNOSIS — M6281 Muscle weakness (generalized): Secondary | ICD-10-CM

## 2022-08-05 NOTE — Therapy (Signed)
OUTPATIENT PHYSICAL THERAPY THORACOLUMBAR TREATMENT   Patient Name: Olivia Shah MRN: [id number] DOB:[birth year], 66 y.o., female Today's Date: 08/05/2022   PT End of Session - 08/05/22 1700     Visit Number 3    Date for PT Re-Evaluation 10/18/22    PT Start Time 1700    PT Stop Time 1745    PT Time Calculation (min) 45 min             Past Medical History:  Diagnosis Date   Arthritis    Diabetes mellitus without complication ([place])    Hypertension    Past Surgical History:  Procedure Laterality Date   APPLICATION OF CRANIAL NAVIGATION N/A 05/18/2021   Procedure: APPLICATION OF CRANIAL NAVIGATION;  Surgeon: Judith Part, MD;  Location: [place];  Service: Neurosurgery;  Laterality: N/A;   BREAST BIOPSY Right    CHOLECYSTECTOMY     CRANIOTOMY Right 05/18/2021   Procedure: Right craniotomy for tumor resection;  Surgeon: Judith Part, MD;  Location: [place];  Service: Neurosurgery;  Laterality: Right;   EYE SURGERY     as a child   HERNIA REPAIR     Patient Active Problem List   Diagnosis Date Noted   Abnormal findings on diagnostic imaging of other specified body structures 01/14/2022   Chronic pain 01/14/2022   Colon cancer screening 01/14/2022   Dysphagia 01/14/2022   Family history of malignant neoplasm of digestive organs 01/14/2022   Hyperglycemia due to type 2 diabetes mellitus ([place]) 01/14/2022   Hypertension 01/14/2022   Meralgia paresthetica 01/14/2022   Mixed hyperlipidemia 01/14/2022   Morbid obesity ([place]) 01/14/2022   Overweight 01/14/2022   Pure hypercholesterolemia 01/14/2022   Spinal cord disease ([place]) 01/14/2022   Chronic cough 12/01/2021   Diabetes ([place]) 07/08/2021   Glaucoma 07/08/2021   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   Visual field defect of left eye 07/08/2021   Debility 06/16/2021   Hypokalemia    Seizures (HCC)    Benign essential HTN    Controlled type 2 diabetes mellitus with hyperglycemia, without long-term current use of  insulin ([place])    Stridor 06/05/2021   Status post tracheostomy ([place])    Acute respiratory failure with hypoxia ([place])    Status epilepticus ([place])    Brain tumor ([place]) 05/18/2021    PCP: Seward Carol, MD   REFERRING PROVIDER: Seward Carol, MD   REFERRING DIAG: Diagnosis M48.061 (ICD-10-CM) - Spinal stenosis, lumbar region without neurogenic claudication   Rationale for Evaluation and Treatment: Rehabilitation  THERAPY DIAG:  Muscle weakness (generalized)  ONSET DATE: 07/06/2022   SUBJECTIVE:  SUBJECTIVE STATEMENT:stinging back of my legs above the knees  PERTINENT HISTORY:  Meningioma, removed, R BG infarct, old  PAIN:  Are you having pain? Yes: NPRS scale: 3/10 Pain location: Posterior legs to knees Pain description: stinging Aggravating factors: walking Relieving factors: sitting  PRECAUTIONS: None  WEIGHT BEARING RESTRICTIONS: No  FALLS:  Has patient fallen in last 6 months? No  LIVING ENVIRONMENT: Lives with: lives alone Lives in: House/apartment Stairs: Yes: External: 2 steps; on right going up Has following equipment at home: Single point cane and Walker - 2 wheeled  OCCUPATION: Receptionist, sitting, full time  PLOF: Independent  PATIENT GOALS: Decrease pain, be able to move around without pain   OBJECTIVE:   DIAGNOSTIC FINDINGS:  Lumbar spinal stenosis  PATIENT SURVEYS:  FOTO 40.26  SCREENING FOR RED FLAGS: Bowel or bladder incontinence: No Spinal tumors: No Cauda equina syndrome: No Compression fracture: No Abdominal aneurysm: No  COGNITION: Overall cognitive status: Within functional limits for tasks assessed    MUSCLE LENGTH: Hamstrings: Right 82 deg; Left 84 deg B hip flexors appear tight  POSTURE: increased lumbar lordosis, decreased thoracic  kyphosis, right pelvic obliquity, and R shoulder elevated RLE longer than L 3/4"  PALPATION: N TTP in back or buttocks or legs  LUMBAR ROM:   AROM eval  Flexion WNL  Extension Severely limited  Right lateral flexion WNL  Left lateral flexion 3" above knee  Right rotation 30%  Left rotation 40%   (Blank rows = not tested)  LOWER EXTREMITY ROM:    WNL B  LOWER EXTREMITY MMT:    MMT Right eval Left eval  Hip flexion 3+ 3+  Hip extension 3- 3-  Hip abduction 3 3  Hip adduction    Hip internal rotation    Hip external rotation    Knee flexion 4- 4-  Knee extension 3+ 3+  Ankle dorsiflexion 4 4  Ankle plantarflexion    Ankle inversion    Ankle eversion     (Blank rows = not tested)  LUMBAR SPECIAL TESTS:  Straight leg raise test: Negative and Slump test: Negative  FUNCTIONAL TESTS:  5 times sit to stand: 19.26 Timed up and go (TUG): 14.50 Functional gait assessment: TBD  GAIT: Distance walked: 5'  Assistive device utilized: Single point cane and it was set too tall, but she declined to lower it. Level of assistance: Modified independence Comments: Patient declined to adjust cane height.  TODAY'S TREATMENT:                                                                                                                              DATE:   08/05/22 Nustep L 4 6 min Knee ext 5# 2 sets 10 HS curl 15# 2 sets 10 HS stretch , active sitting 3 x each side 15 sec hold STS with wt ball chest press 10 x STS on airex 10 x HHA on airex 20 x Alt  marching,hip flex,ext  and abd CG-min A. ABD very challenging Ball btwn the knees squeeze 15 Seated green tband hip abd and flex 2 sets 10 Amb without AD 2 laps 300 feet 1 min 51 sec    08/02/22 NuStep L5x20mns S2S 2x10 4" heel taps SBA   LAQ 2x10  HS curls greenTB 2x10 Hip abd greenTB 2x10 seated 2x10 Rows and ext greenTB 2x10    PATIENT EDUCATION:  Education details: POC Person educated: Patient Education method:  Explanation Education comprehension: verbalized understanding  HOME EXERCISE PROGRAM: TBD  ASSESSMENT:  CLINICAL IMPRESSION: Patient returns with some pain in the back of her . Progressed  LE strengthening , hip weakness noted. Tolerates session well without c/o of pain.    OBJECTIVE IMPAIRMENTS: Abnormal gait, decreased activity tolerance, decreased balance, decreased coordination, decreased endurance, decreased mobility, difficulty walking, decreased ROM, decreased strength, increased muscle spasms, impaired flexibility, improper body mechanics, postural dysfunction, and pain.   ACTIVITY LIMITATIONS: carrying, lifting, bending, standing, squatting, sleeping, stairs, transfers, and locomotion level  PARTICIPATION LIMITATIONS: meal prep, cleaning, laundry, interpersonal relationship, driving, shopping, and occupation  PERSONAL FACTORS: Age, Past/current experiences, and 1 comorbidity: Meningioma  are also affecting patient's functional outcome.   REHAB POTENTIAL: Good  CLINICAL DECISION MAKING: Evolving/moderate complexity  EVALUATION COMPLEXITY: Moderate   GOALS: Goals reviewed with patient? Yes  SHORT TERM GOALS: Target date: 08/23/2022  I with basic HEP Baseline: Goal status: INITIAL  LONG TERM GOALS: Target date: 10/18/2022  I with final HEP Baseline:  Goal status: INITIAL  2.  Increase FOTO score to at least 52 Baseline: 40 Goal status: INITIAL  3.  Decrease TUG14.5 time to < 12 Baseline:  Goal status: INITIAL  4.  Decrease 5 x STS to < 12 sec Baseline: 19 Goal status: INITIAL  5.  Patient will walk x at least 300' with LRAD, MI, no pain in legs Baseline:  Goal status: INITIAL  6.  Assess and update sitting posture in her workspace to provide adequate trunk support. Baseline:  Goal status: INITIAL  PLAN:  PT FREQUENCY: 1-2x/week  PT DURATION: 12 weeks  PLANNED INTERVENTIONS: Therapeutic exercises, Therapeutic activity, Neuromuscular  re-education, Balance training, Gait training, Patient/Family education, Self Care, Joint mobilization, Stair training, Dry Needling, Electrical stimulation, Cryotherapy, Moist heat, Ionotophoresis '4mg'$ /ml Dexamethasone, and Manual therapy.  PLAN FOR NEXT SESSION: FGA, HEP   ALevada DyPayseur PTA 08/05/2022, 5:01 PM [place] [address]Phone: [phone]  Fax:  [phone] Patient Details  Name: Olivia FOMBYMRN: [id number] of Birth: [birth year]Referring Provider:  PSeward Carol MD  Encounter Date: 08/05/2022   PLaqueta Carina PTA 08/05/2022, 5:01 PM  [place] [address]Phone: [phone]  Fax:  [phone]

## 2022-08-06 NOTE — Therapy (Signed)
OUTPATIENT PHYSICAL THERAPY THORACOLUMBAR TREATMENT   Patient Name: Olivia Shah MRN: [id number] DOB:[birth year], 66 y.o., female Today's Date: 08/05/2022   PT End of Session - 08/05/22 1700     Visit Number 3    Date for PT Re-Evaluation 10/18/22    PT Start Time 1700    PT Stop Time 1745    PT Time Calculation (min) 45 min             Past Medical History:  Diagnosis Date   Arthritis    Diabetes mellitus without complication ([place])    Hypertension    Past Surgical History:  Procedure Laterality Date   APPLICATION OF CRANIAL NAVIGATION N/A 05/18/2021   Procedure: APPLICATION OF CRANIAL NAVIGATION;  Surgeon: Judith Part, MD;  Location: [facility];  Service: Neurosurgery;  Laterality: N/A;   BREAST BIOPSY Right    CHOLECYSTECTOMY     CRANIOTOMY Right 05/18/2021   Procedure: Right craniotomy for tumor resection;  Surgeon: Judith Part, MD;  Location: [facility];  Service: Neurosurgery;  Laterality: Right;   EYE SURGERY     as a child   HERNIA REPAIR     Patient Active Problem List   Diagnosis Date Noted   Abnormal findings on diagnostic imaging of other specified body structures 01/14/2022   Chronic pain 01/14/2022   Colon cancer screening 01/14/2022   Dysphagia 01/14/2022   Family history of malignant neoplasm of digestive organs 01/14/2022   Hyperglycemia due to type 2 diabetes mellitus ([place]) 01/14/2022   Hypertension 01/14/2022   Meralgia paresthetica 01/14/2022   Mixed hyperlipidemia 01/14/2022   Morbid obesity ([place]) 01/14/2022   Overweight 01/14/2022   Pure hypercholesterolemia 01/14/2022   Spinal cord disease ([place]) 01/14/2022   Chronic cough 12/01/2021   Diabetes ([place]) 07/08/2021   Glaucoma 07/08/2021   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   Visual field defect of left eye 07/08/2021   Debility 06/16/2021   Hypokalemia    Seizures (HCC)    Benign essential HTN    Controlled type 2 diabetes mellitus with hyperglycemia, without long-term current use of  insulin ([place])    Stridor 06/05/2021   Status post tracheostomy ([place])    Acute respiratory failure with hypoxia ([place])    Status epilepticus ([place])    Brain tumor ([place]) 05/18/2021    PCP: Seward Carol, MD   REFERRING PROVIDER: Seward Carol, MD   REFERRING DIAG: Diagnosis M48.061 (ICD-10-CM) - Spinal stenosis, lumbar region without neurogenic claudication   Rationale for Evaluation and Treatment: Rehabilitation  THERAPY DIAG:  Muscle weakness (generalized)  ONSET DATE: 07/06/2022   SUBJECTIVE:  SUBJECTIVE STATEMENT:stinging back of my legs above the knees  PERTINENT HISTORY:  Meningioma, removed, R BG infarct, old  PAIN:  Are you having pain? Yes: NPRS scale: 3/10 Pain location: Posterior legs to knees Pain description: stinging Aggravating factors: walking Relieving factors: sitting  PRECAUTIONS: None  WEIGHT BEARING RESTRICTIONS: No  FALLS:  Has patient fallen in last 6 months? No  LIVING ENVIRONMENT: Lives with: lives alone Lives in: House/apartment Stairs: Yes: External: 2 steps; on right going up Has following equipment at home: Single point cane and Walker - 2 wheeled  OCCUPATION: Receptionist, sitting, full time  PLOF: Independent  PATIENT GOALS: Decrease pain, be able to move around without pain   OBJECTIVE:   DIAGNOSTIC FINDINGS:  Lumbar spinal stenosis  PATIENT SURVEYS:  FOTO 40.26  SCREENING FOR RED FLAGS: Bowel or bladder incontinence: No Spinal tumors: No Cauda equina syndrome: No Compression fracture: No Abdominal aneurysm: No  COGNITION: Overall cognitive status: Within functional limits for tasks assessed    MUSCLE LENGTH: Hamstrings: Right 82 deg; Left 84 deg B hip flexors appear tight  POSTURE: increased lumbar lordosis, decreased thoracic  kyphosis, right pelvic obliquity, and R shoulder elevated RLE longer than L 3/4"  PALPATION: N TTP in back or buttocks or legs  LUMBAR ROM:   AROM eval  Flexion WNL  Extension Severely limited  Right lateral flexion WNL  Left lateral flexion 3" above knee  Right rotation 30%  Left rotation 40%   (Blank rows = not tested)  LOWER EXTREMITY ROM:    WNL B  LOWER EXTREMITY MMT:    MMT Right eval Left eval  Hip flexion 3+ 3+  Hip extension 3- 3-  Hip abduction 3 3  Hip adduction    Hip internal rotation    Hip external rotation    Knee flexion 4- 4-  Knee extension 3+ 3+  Ankle dorsiflexion 4 4  Ankle plantarflexion    Ankle inversion    Ankle eversion     (Blank rows = not tested)  LUMBAR SPECIAL TESTS:  Straight leg raise test: Negative and Slump test: Negative  FUNCTIONAL TESTS:  5 times sit to stand: 19.26 Timed up and go (TUG): 14.50 Functional gait assessment: TBD  GAIT: Distance walked: 20'  Assistive device utilized: Single point cane and it was set too tall, but she declined to lower it. Level of assistance: Modified independence Comments: Patient declined to adjust cane height.  TODAY'S TREATMENT:                                                                                                                              DATE:  08/09/22 NuStep Step ups  Leg ext HS curls Step ups on airex  S2S on airex with chest press Side steps over obstavles  08/05/22 Nustep L 4 6 min Knee ext 5# 2 sets 10 HS curl 15# 2 sets 10 HS stretch , active sitting 3 x each side 15  sec hold STS with wt ball chest press 10 x STS on airex 10 x HHA on airex 20 x Alt  marching,hip flex,ext and abd CG-min A. ABD very challenging Ball btwn the knees squeeze 15 Seated green tband hip abd and flex 2 sets 10 Amb without AD 2 laps 300 feet 1 min 51 sec    08/02/22 NuStep L5x53mns S2S 2x10 4" heel taps SBA   LAQ 2x10  HS curls greenTB 2x10 Hip abd greenTB 2x10 seated  2x10 Rows and ext greenTB 2x10    PATIENT EDUCATION:  Education details: POC Person educated: Patient Education method: Explanation Education comprehension: verbalized understanding  HOME EXERCISE PROGRAM: TBD  ASSESSMENT:  CLINICAL IMPRESSION: Patient returns with some pain in the back of her . Progressed  LE strengthening , hip weakness noted. Tolerates session well without c/o of pain.    OBJECTIVE IMPAIRMENTS: Abnormal gait, decreased activity tolerance, decreased balance, decreased coordination, decreased endurance, decreased mobility, difficulty walking, decreased ROM, decreased strength, increased muscle spasms, impaired flexibility, improper body mechanics, postural dysfunction, and pain.   ACTIVITY LIMITATIONS: carrying, lifting, bending, standing, squatting, sleeping, stairs, transfers, and locomotion level  PARTICIPATION LIMITATIONS: meal prep, cleaning, laundry, interpersonal relationship, driving, shopping, and occupation  PERSONAL FACTORS: Age, Past/current experiences, and 1 comorbidity: Meningioma  are also affecting patient's functional outcome.   REHAB POTENTIAL: Good  CLINICAL DECISION MAKING: Evolving/moderate complexity  EVALUATION COMPLEXITY: Moderate   GOALS: Goals reviewed with patient? Yes  SHORT TERM GOALS: Target date: 08/23/2022  I with basic HEP Baseline: Goal status: INITIAL  LONG TERM GOALS: Target date: 10/18/2022  I with final HEP Baseline:  Goal status: INITIAL  2.  Increase FOTO score to at least 52 Baseline: 40 Goal status: INITIAL  3.  Decrease TUG14.5 time to < 12 Baseline:  Goal status: INITIAL  4.  Decrease 5 x STS to < 12 sec Baseline: 19 Goal status: INITIAL  5.  Patient will walk x at least 300' with LRAD, MI, no pain in legs Baseline:  Goal status: INITIAL  6.  Assess and update sitting posture in her workspace to provide adequate trunk support. Baseline:  Goal status: INITIAL  PLAN:  PT FREQUENCY:  1-2x/week  PT DURATION: 12 weeks  PLANNED INTERVENTIONS: Therapeutic exercises, Therapeutic activity, Neuromuscular re-education, Balance training, Gait training, Patient/Family education, Self Care, Joint mobilization, Stair training, Dry Needling, Electrical stimulation, Cryotherapy, Moist heat, Ionotophoresis '4mg'$ /ml Dexamethasone, and Manual therapy.  PLAN FOR NEXT SESSION: FGA, HEP   ALevada DyPayseur PTA 08/05/2022, 5:01 PM [place] [address]Phone: [phone]  Fax:  [phone] Patient Details  Name: Olivia TUCHOLSKIMRN: [id number] of Birth: 711/07/1957Referring Provider:  PSeward Carol MD  Encounter Date: 08/05/2022   PLaqueta Carina PTA 08/05/2022, 5:01 PM  [place] [address]Phone: [phone]  Fax:  [phone]

## 2022-08-09 ENCOUNTER — Ambulatory Visit: Payer: Medicare Other

## 2022-08-09 DIAGNOSIS — M6281 Muscle weakness (generalized): Secondary | ICD-10-CM

## 2022-08-09 DIAGNOSIS — R262 Difficulty in walking, not elsewhere classified: Secondary | ICD-10-CM

## 2022-08-09 DIAGNOSIS — R2681 Unsteadiness on feet: Secondary | ICD-10-CM

## 2022-08-09 DIAGNOSIS — R278 Other lack of coordination: Secondary | ICD-10-CM

## 2022-08-11 ENCOUNTER — Ambulatory Visit: Payer: Medicare Other | Admitting: Physical Therapy

## 2022-08-12 ENCOUNTER — Ambulatory Visit: Payer: Medicare Other

## 2022-08-12 ENCOUNTER — Ambulatory Visit: Payer: Medicare Other | Admitting: Physical Therapy

## 2022-08-12 DIAGNOSIS — M6281 Muscle weakness (generalized): Secondary | ICD-10-CM | POA: Diagnosis not present

## 2022-08-12 DIAGNOSIS — R262 Difficulty in walking, not elsewhere classified: Secondary | ICD-10-CM

## 2022-08-12 DIAGNOSIS — R2681 Unsteadiness on feet: Secondary | ICD-10-CM

## 2022-08-12 DIAGNOSIS — R278 Other lack of coordination: Secondary | ICD-10-CM

## 2022-08-12 NOTE — Therapy (Signed)
OUTPATIENT PHYSICAL THERAPY THORACOLUMBAR TREATMENT   Patient Name: Olivia Shah MRN: [id number] DOB:1955-11-23, 66 y.o., female Today's Date: 08/12/2022   PT End of Session - 08/12/22 1800     Visit Number 5    Date for PT Re-Evaluation 10/18/22    PT Start Time 1800    PT Stop Time 1845    PT Time Calculation (min) 45 min    Activity Tolerance Patient tolerated treatment well    Behavior During Therapy [facility] for tasks assessed/performed              Past Medical History:  Diagnosis Date   Arthritis    Diabetes mellitus without complication ([place])    Hypertension    Past Surgical History:  Procedure Laterality Date   APPLICATION OF CRANIAL NAVIGATION N/A 05/18/2021   Procedure: APPLICATION OF CRANIAL NAVIGATION;  Surgeon: Judith Part, MD;  Location: [place];  Service: Neurosurgery;  Laterality: N/A;   BREAST BIOPSY Right    CHOLECYSTECTOMY     CRANIOTOMY Right 05/18/2021   Procedure: Right craniotomy for tumor resection;  Surgeon: Judith Part, MD;  Location: [place];  Service: Neurosurgery;  Laterality: Right;   EYE SURGERY     as a child   HERNIA REPAIR     Patient Active Problem List   Diagnosis Date Noted   Abnormal findings on diagnostic imaging of other specified body structures 01/14/2022   Chronic pain 01/14/2022   Colon cancer screening 01/14/2022   Dysphagia 01/14/2022   Family history of malignant neoplasm of digestive organs 01/14/2022   Hyperglycemia due to type 2 diabetes mellitus ([place]) 01/14/2022   Hypertension 01/14/2022   Meralgia paresthetica 01/14/2022   Mixed hyperlipidemia 01/14/2022   Morbid obesity ([place]) 01/14/2022   Overweight 01/14/2022   Pure hypercholesterolemia 01/14/2022   Spinal cord disease ([place]) 01/14/2022   Chronic cough 12/01/2021   Diabetes ([place]) 07/08/2021   Glaucoma 07/08/2021   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   Visual field defect of left eye 07/08/2021   Debility 06/16/2021   Hypokalemia    Seizures (HCC)     Benign essential HTN    Controlled type 2 diabetes mellitus with hyperglycemia, without long-term current use of insulin ([place])    Stridor 06/05/2021   Status post tracheostomy ([place])    Acute respiratory failure with hypoxia ([place])    Status epilepticus ([place])    Brain tumor ([place]) 05/18/2021    PCP: Seward Carol, MD   REFERRING PROVIDER: Seward Carol, MD   REFERRING DIAG: Diagnosis M48.061 (ICD-10-CM) - Spinal stenosis, lumbar region without neurogenic claudication   Rationale for Evaluation and Treatment: Rehabilitation  THERAPY DIAG:  Muscle weakness (generalized)  Other lack of coordination  Unsteadiness on feet  Difficulty in walking, not elsewhere classified  ONSET DATE: 07/06/2022   SUBJECTIVE:  SUBJECTIVE STATEMENT:Still having some stinging in the back of my legs   PERTINENT HISTORY:  Meningioma, removed, R BG infarct, old  PAIN:  Are you having pain? Yes: NPRS scale: 3/10 Pain location: Posterior legs to knees Pain description: stinging Aggravating factors: walking Relieving factors: sitting  PRECAUTIONS: None  WEIGHT BEARING RESTRICTIONS: No  FALLS:  Has patient fallen in last 6 months? No  LIVING ENVIRONMENT: Lives with: lives alone Lives in: House/apartment Stairs: Yes: External: 2 steps; on right going up Has following equipment at home: Single point cane and Walker - 2 wheeled  OCCUPATION: Receptionist, sitting, full time  PLOF: Independent  PATIENT GOALS: Decrease pain, be able to move around without pain   OBJECTIVE:   DIAGNOSTIC FINDINGS:  Lumbar spinal stenosis  PATIENT SURVEYS:  FOTO 40.26  SCREENING FOR RED FLAGS: Bowel or bladder incontinence: No Spinal tumors: No Cauda equina syndrome: No Compression fracture: No Abdominal aneurysm:  No  COGNITION: Overall cognitive status: Within functional limits for tasks assessed    MUSCLE LENGTH: Hamstrings: Right 82 deg; Left 84 deg B hip flexors appear tight  POSTURE: increased lumbar lordosis, decreased thoracic kyphosis, right pelvic obliquity, and R shoulder elevated RLE longer than L 3/4"  PALPATION: N TTP in back or buttocks or legs  LUMBAR ROM:   AROM eval  Flexion WNL  Extension Severely limited  Right lateral flexion WNL  Left lateral flexion 3" above knee  Right rotation 30%  Left rotation 40%   (Blank rows = not tested)  LOWER EXTREMITY ROM:    WNL B  LOWER EXTREMITY MMT:    MMT Right eval Left eval  Hip flexion 3+ 3+  Hip extension 3- 3-  Hip abduction 3 3  Hip adduction    Hip internal rotation    Hip external rotation    Knee flexion 4- 4-  Knee extension 3+ 3+  Ankle dorsiflexion 4 4  Ankle plantarflexion    Ankle inversion    Ankle eversion     (Blank rows = not tested)  LUMBAR SPECIAL TESTS:  Straight leg raise test: Negative and Slump test: Negative  FUNCTIONAL TESTS:  5 times sit to stand: 19.26 Timed up and go (TUG): 14.50 Functional gait assessment: TBD  GAIT: Distance walked: 26'  Assistive device utilized: Single point cane and it was set too tall, but she declined to lower it. Level of assistance: Modified independence Comments: Patient declined to adjust cane height.  TODAY'S TREATMENT:                                                                                                                              DATE:  08/12/22 NuStep L5 x24mns  Calf stretch 30s Calf raises 2x10 Rows and Lats 15# 2x10  Shoulder ext 5# 2x10 Standing 2 way hip abd/ext 2# 2x10 Heel taps 6" w/2# 2x10- CGA  Walking w/o AD 1 big lap  blackTB ext 2x10   08/09/22 NuStep L5  x32mns Step ups 4" with HHA Leg ext 5# 2x10 HS curls 15# 2x10 Step ups on airex- minA  S2S on airex with chest press redball 2x10 Side steps over  obstacles Calf stretch 30s on bar Calf raises 2x10 on bar   08/05/22 Nustep L 4 6 min Knee ext 5# 2 sets 10 HS curl 15# 2 sets 10 HS stretch , active sitting 3 x each side 15 sec hold STS with wt ball chest press 10 x STS on airex 10 x HHA on airex 20 x Alt  marching,hip flex,ext and abd CG-min A. ABD very challenging Ball btwn the knees squeeze 15 Seated green tband hip abd and flex 2 sets 10 Amb without AD 2 laps 300 feet 1 min 51 sec    08/02/22 NuStep L5x615ms S2S 2x10 4" heel taps SBA   LAQ 2x10  HS curls greenTB 2x10 Hip abd greenTB 2x10 seated 2x10 Rows and ext greenTB 2x10    PATIENT EDUCATION:  Education details: POC Person educated: Patient Education method: Explanation Education comprehension: verbalized understanding  HOME EXERCISE PROGRAM: TBD  ASSESSMENT:  CLINICAL IMPRESSION: Patient returns with no pain in legs today, states it was bad in the morning but has gotten better. Worked on some low back strengthening and LE strengthening. Tolerates session well, no complaints of pain. CG A needed with heel taps due to unsteadiness.    OBJECTIVE IMPAIRMENTS: Abnormal gait, decreased activity tolerance, decreased balance, decreased coordination, decreased endurance, decreased mobility, difficulty walking, decreased ROM, decreased strength, increased muscle spasms, impaired flexibility, improper body mechanics, postural dysfunction, and pain.   ACTIVITY LIMITATIONS: carrying, lifting, bending, standing, squatting, sleeping, stairs, transfers, and locomotion level  PARTICIPATION LIMITATIONS: meal prep, cleaning, laundry, interpersonal relationship, driving, shopping, and occupation  PERSONAL FACTORS: Age, Past/current experiences, and 1 comorbidity: Meningioma  are also affecting patient's functional outcome.   REHAB POTENTIAL: Good  CLINICAL DECISION MAKING: Evolving/moderate complexity  EVALUATION COMPLEXITY: Moderate   GOALS: Goals reviewed with  patient? Yes  SHORT TERM GOALS: Target date: 08/23/2022  I with basic HEP Baseline: Goal status: INITIAL  LONG TERM GOALS: Target date: 10/18/2022  I with final HEP Baseline:  Goal status: INITIAL  2.  Increase FOTO score to at least 52 Baseline: 40 Goal status: INITIAL  3.  Decrease TUG14.5 time to < 12 Baseline:  Goal status: INITIAL  4.  Decrease 5 x STS to < 12 sec Baseline: 19 Goal status: INITIAL  5.  Patient will walk x at least 300' with LRAD, MI, no pain in legs Baseline:  Goal status: INITIAL  6.  Assess and update sitting posture in her workspace to provide adequate trunk support. Baseline:  Goal status: INITIAL  PLAN:  PT FREQUENCY: 1-2x/week  PT DURATION: 12 weeks  PLANNED INTERVENTIONS: Therapeutic exercises, Therapeutic activity, Neuromuscular re-education, Balance training, Gait training, Patient/Family education, Self Care, Joint mobilization, Stair training, Dry Needling, Electrical stimulation, Cryotherapy, Moist heat, Ionotophoresis '4mg'$ /ml Dexamethasone, and Manual therapy.  PLAN FOR NEXT SESSION: FGA, HEP   AnFranki MonteTA 08/12/2022, 6:43 PM [place]one: [phone] Fax:  [phone] Details  Name: JaRAYLIN DIGUGLIELMORN: [id number] of Birth: 03/1956/08/18eferring Provider:  PoSeward CarolMD  Encounter Date: 08/12/2022   MoAndris BaumannPT 08/12/2022, 6:43 PM  [place]hone: [phone] Fax:  [phone]

## 2022-08-23 ENCOUNTER — Ambulatory Visit: Payer: Medicare Other | Admitting: Physical Therapy

## 2022-08-23 ENCOUNTER — Encounter: Payer: Self-pay | Admitting: Physical Therapy

## 2022-08-23 DIAGNOSIS — M5459 Other low back pain: Secondary | ICD-10-CM

## 2022-08-23 DIAGNOSIS — R2681 Unsteadiness on feet: Secondary | ICD-10-CM

## 2022-08-23 DIAGNOSIS — M6281 Muscle weakness (generalized): Secondary | ICD-10-CM

## 2022-08-23 DIAGNOSIS — R278 Other lack of coordination: Secondary | ICD-10-CM

## 2022-08-23 DIAGNOSIS — R262 Difficulty in walking, not elsewhere classified: Secondary | ICD-10-CM

## 2022-08-23 NOTE — Therapy (Signed)
OUTPATIENT PHYSICAL THERAPY THORACOLUMBAR TREATMENT   Patient Name: Olivia Shah MRN: [id number] DOB:1956-04-07, 66 y.o., female Today's Date: 08/23/2022   PT End of Session - 08/23/22 1231     Visit Number 6    Date for PT Re-Evaluation 10/18/22    PT Start Time 1226    PT Stop Time 1310    PT Time Calculation (min) 44 min    Activity Tolerance Patient tolerated treatment well    Behavior During Therapy [facility] for tasks assessed/performed              Past Medical History:  Diagnosis Date   Arthritis    Diabetes mellitus without complication ([place])    Hypertension    Past Surgical History:  Procedure Laterality Date   APPLICATION OF CRANIAL NAVIGATION N/A 05/18/2021   Procedure: APPLICATION OF CRANIAL NAVIGATION;  Surgeon: Judith Part, MD;  Location: [place];  Service: Neurosurgery;  Laterality: N/A;   BREAST BIOPSY Right    CHOLECYSTECTOMY     CRANIOTOMY Right 05/18/2021   Procedure: Right craniotomy for tumor resection;  Surgeon: Judith Part, MD;  Location: [place];  Service: Neurosurgery;  Laterality: Right;   EYE SURGERY     as a child   HERNIA REPAIR     Patient Active Problem List   Diagnosis Date Noted   Abnormal findings on diagnostic imaging of other specified body structures 01/14/2022   Chronic pain 01/14/2022   Colon cancer screening 01/14/2022   Dysphagia 01/14/2022   Family history of malignant neoplasm of digestive organs 01/14/2022   Hyperglycemia due to type 2 diabetes mellitus ([place]) 01/14/2022   Hypertension 01/14/2022   Meralgia paresthetica 01/14/2022   Mixed hyperlipidemia 01/14/2022   Morbid obesity ([place]) 01/14/2022   Overweight 01/14/2022   Pure hypercholesterolemia 01/14/2022   Spinal cord disease ([place]) 01/14/2022   Chronic cough 12/01/2021   Diabetes ([place]) 07/08/2021   Glaucoma 07/08/2021   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   Visual field defect of left eye 07/08/2021   Debility 06/16/2021   Hypokalemia    Seizures (HCC)     Benign essential HTN    Controlled type 2 diabetes mellitus with hyperglycemia, without long-term current use of insulin ([place])    Stridor 06/05/2021   Status post tracheostomy ([place])    Acute respiratory failure with hypoxia ([place])    Status epilepticus ([place])    Brain tumor ([place]) 05/18/2021    PCP: Seward Carol, MD   REFERRING PROVIDER: Seward Carol, MD   REFERRING DIAG: Diagnosis M48.061 (ICD-10-CM) - Spinal stenosis, lumbar region without neurogenic claudication   Rationale for Evaluation and Treatment: Rehabilitation  THERAPY DIAG:  Muscle weakness (generalized)  Other lack of coordination  Unsteadiness on feet  Other low back pain  Difficulty in walking, not elsewhere classified  ONSET DATE: 07/06/2022   SUBJECTIVE:  SUBJECTIVE STATEMENT: patient reports no change in her pain pattern, occasional burning in the backs of her legs.  PERTINENT HISTORY:  Meningioma, removed, R BG infarct, old  PAIN:  Are you having pain? Yes: NPRS scale: 3/10 Pain location: Posterior legs to knees Pain description: stinging Aggravating factors: walking Relieving factors: sitting  PRECAUTIONS: None  WEIGHT BEARING RESTRICTIONS: No  FALLS:  Has patient fallen in last 6 months? No  LIVING ENVIRONMENT: Lives with: lives alone Lives in: House/apartment Stairs: Yes: External: 2 steps; on right going up Has following equipment at home: Single point cane and Walker - 2 wheeled  OCCUPATION: Receptionist, sitting, full time  PLOF: Independent  PATIENT GOALS: Decrease pain, be able to move around without pain   OBJECTIVE:   DIAGNOSTIC FINDINGS:  Lumbar spinal stenosis  PATIENT SURVEYS:  FOTO 40.26  SCREENING FOR RED FLAGS: Bowel or bladder incontinence: No Spinal tumors: No Cauda  equina syndrome: No Compression fracture: No Abdominal aneurysm: No  COGNITION: Overall cognitive status: Within functional limits for tasks assessed    MUSCLE LENGTH: Hamstrings: Right 82 deg; Left 84 deg B hip flexors appear tight  POSTURE: increased lumbar lordosis, decreased thoracic kyphosis, right pelvic obliquity, and R shoulder elevated RLE longer than L 3/4"  PALPATION: N TTP in back or buttocks or legs  LUMBAR ROM:   AROM eval  Flexion WNL  Extension Severely limited  Right lateral flexion WNL  Left lateral flexion 3" above knee  Right rotation 30%  Left rotation 40%   (Blank rows = not tested)  LOWER EXTREMITY ROM:    WNL B  LOWER EXTREMITY MMT:    MMT Right eval Left eval  Hip flexion 3+ 3+  Hip extension 3- 3-  Hip abduction 3 3  Hip adduction    Hip internal rotation    Hip external rotation    Knee flexion 4- 4-  Knee extension 3+ 3+  Ankle dorsiflexion 4 4  Ankle plantarflexion    Ankle inversion    Ankle eversion     (Blank rows = not tested)  LUMBAR SPECIAL TESTS:  Straight leg raise test: Negative and Slump test: Negative  FUNCTIONAL TESTS:  5 times sit to stand: 19.26 Timed up and go (TUG): 14.50 Functional gait assessment: TBD  GAIT: Distance walked: 19'  Assistive device utilized: Single point cane and it was set too tall, but she declined to lower it. Level of assistance: Modified independence Comments: Patient declined to adjust cane height.  TODAY'S TREATMENT:                                                                                                                              DATE:  08/23/22 NuStep L4 x 6 minutes Supine bridging x 10 reps SLR x 10 each leg Clamshells with G tband resistance x 10 Lower Trunk Rotation Glut stretch in supine Sidelying hip abd, 5 reps each side Seated HS stretch Standing side step with  red tband resistance, BUE support on counter Standing shoulder ext and Rows with Red Tband-2 x 10  reps Standing shoulder ER red tband resistance 2 x 10   08/12/22 NuStep L5 x27mns  Calf stretch 30s Calf raises 2x10 Rows and Lats 15# 2x10  Shoulder ext 5# 2x10 Standing 2 way hip abd/ext 2# 2x10 Heel taps 6" w/2# 2x10- CGA  Walking w/o AD 1 big lap  blackTB ext 2x10  08/09/22 NuStep L5 x640ms Step ups 4" with HHA Leg ext 5# 2x10 HS curls 15# 2x10 Step ups on airex- minA  S2S on airex with chest press redball 2x10 Side steps over obstacles Calf stretch 30s on bar Calf raises 2x10 on bar  08/05/22 Nustep L 4 6 min Knee ext 5# 2 sets 10 HS curl 15# 2 sets 10 HS stretch , active sitting 3 x each side 15 sec hold STS with wt ball chest press 10 x STS on airex 10 x HHA on airex 20 x Alt  marching,hip flex,ext and abd CG-min A. ABD very challenging Ball btwn the knees squeeze 15 Seated green tband hip abd and flex 2 sets 10 Amb without AD 2 laps 300 feet 1 min 51 sec  08/02/22 NuStep L5x6m22m S2S 2x10 4" heel taps SBA   LAQ 2x10  HS curls greenTB 2x10 Hip abd greenTB 2x10 seated 2x10 Rows and ext greenTB 2x10  PATIENT EDUCATION:  Education details: POC Person educated: Patient Education method: Explanation Education comprehension: verbalized understanding  HOME EXERCISE PROGRAM:   [id number]:  CLINICAL IMPRESSION: Patient reports no real change in her pain, occasionally flairs in her legs. Initiated HEP for stregnth and stretching to relieve back pain.   OBJECTIVE IMPAIRMENTS: Abnormal gait, decreased activity tolerance, decreased balance, decreased coordination, decreased endurance, decreased mobility, difficulty walking, decreased ROM, decreased strength, increased muscle spasms, impaired flexibility, improper body mechanics, postural dysfunction, and pain.   ACTIVITY LIMITATIONS: carrying, lifting, bending, standing, squatting, sleeping, stairs, transfers, and locomotion level  PARTICIPATION LIMITATIONS: meal prep, cleaning, laundry,  interpersonal relationship, driving, shopping, and occupation  PERSONAL FACTORS: Age, Past/current experiences, and 1 comorbidity: Meningioma  are also affecting patient's functional outcome.   REHAB POTENTIAL: Good  CLINICAL DECISION MAKING: Evolving/moderate complexity  EVALUATION COMPLEXITY: Moderate   GOALS: Goals reviewed with patient? Yes  SHORT TERM GOALS: Target date: 08/23/2022  I with basic HEP Baseline: Goal status: ongoing  LONG TERM GOALS: Target date: 10/18/2022  I with final HEP Baseline:  Goal status: INITIAL  2.  Increase FOTO score to at least 52 Baseline: 40 Goal status: ongoing  3.  Decrease TUG14.5 time to < 12 Baseline:  Goal status: INITIAL  4.  Decrease 5 x STS to < 12 sec Baseline: 19 Goal status: INITIAL  5.  Patient will walk x at least 300' with LRAD, MI, no pain in legs Baseline:  Goal status: INITIAL  6.  Assess and update sitting posture in her workspace to provide adequate trunk support. Baseline:  Goal status: INITIAL  PLAN:  PT FREQUENCY: 1-2x/week  PT DURATION: 12 weeks  PLANNED INTERVENTIONS: Therapeutic exercises, Therapeutic activity, Neuromuscular re-education, Balance training, Gait training, Patient/Family education, Self Care, Joint mobilization, Stair training, Dry Needling, Electrical stimulation, Cryotherapy, Moist heat, Ionotophoresis '4mg'$ /ml Dexamethasone, and Manual therapy.  PLAN FOR NEXT SESSION: How'd HEP go?  Patient Details  Name: JamKARLE DESROSIERN: [id number] of Birth: 7/3December 30, 1957ferring Provider:  PolSeward CarolD  Encounter Date: 08/23/2022   SusEthel RanaT 08/23/22 1:11 PM  08/23/2022, 1:11 PM  [address] [place], [place], [address] Phone: [phone]   Fax:  [phone]

## 2022-08-26 ENCOUNTER — Ambulatory Visit: Payer: Medicare Other | Admitting: Physical Therapy

## 2022-08-26 DIAGNOSIS — R2681 Unsteadiness on feet: Secondary | ICD-10-CM

## 2022-08-26 DIAGNOSIS — R262 Difficulty in walking, not elsewhere classified: Secondary | ICD-10-CM

## 2022-08-26 DIAGNOSIS — M6281 Muscle weakness (generalized): Secondary | ICD-10-CM | POA: Diagnosis not present

## 2022-08-26 DIAGNOSIS — M5459 Other low back pain: Secondary | ICD-10-CM

## 2022-08-26 NOTE — Therapy (Signed)
OUTPATIENT PHYSICAL THERAPY THORACOLUMBAR TREATMENT   Patient Name: Olivia Shah MRN: [id number] DOB:1955-11-06, 66 y.o., female Today's Date: 08/26/2022   PT End of Session - 08/26/22 1656     Visit Number 7    Date for PT Re-Evaluation 10/18/22    PT Start Time 1655    PT Stop Time 1745    PT Time Calculation (min) 50 min              Past Medical History:  Diagnosis Date   Arthritis    Diabetes mellitus without complication ([place])    Hypertension    Past Surgical History:  Procedure Laterality Date   APPLICATION OF CRANIAL NAVIGATION N/A 05/18/2021   Procedure: APPLICATION OF CRANIAL NAVIGATION;  Surgeon: Judith Part, MD;  Location: [facility];  Service: Neurosurgery;  Laterality: N/A;   BREAST BIOPSY Right    CHOLECYSTECTOMY     CRANIOTOMY Right 05/18/2021   Procedure: Right craniotomy for tumor resection;  Surgeon: Judith Part, MD;  Location: [facility];  Service: Neurosurgery;  Laterality: Right;   EYE SURGERY     as a child   HERNIA REPAIR     Patient Active Problem List   Diagnosis Date Noted   Abnormal findings on diagnostic imaging of other specified body structures 01/14/2022   Chronic pain 01/14/2022   Colon cancer screening 01/14/2022   Dysphagia 01/14/2022   Family history of malignant neoplasm of digestive organs 01/14/2022   Hyperglycemia due to type 2 diabetes mellitus ([place]) 01/14/2022   Hypertension 01/14/2022   Meralgia paresthetica 01/14/2022   Mixed hyperlipidemia 01/14/2022   Morbid obesity ([place]) 01/14/2022   Overweight 01/14/2022   Pure hypercholesterolemia 01/14/2022   Spinal cord disease ([place]) 01/14/2022   Chronic cough 12/01/2021   Diabetes ([place]) 07/08/2021   Glaucoma 07/08/2021   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   Visual field defect of left eye 07/08/2021   Debility 06/16/2021   Hypokalemia    Seizures (HCC)    Benign essential HTN    Controlled type 2 diabetes mellitus with hyperglycemia, without long-term current use of  insulin ([place])    Stridor 06/05/2021   Status post tracheostomy ([place])    Acute respiratory failure with hypoxia ([place])    Status epilepticus ([place])    Brain tumor ([place]) 05/18/2021    PCP: Seward Carol, MD   REFERRING PROVIDER: Seward Carol, MD   REFERRING DIAG: Diagnosis M48.061 (ICD-10-CM) - Spinal stenosis, lumbar region without neurogenic claudication   Rationale for Evaluation and Treatment: Rehabilitation  THERAPY DIAG:  Muscle weakness (generalized)  Unsteadiness on feet  Other low back pain  Difficulty in walking, not elsewhere classified  ONSET DATE: 07/06/2022   SUBJECTIVE:  SUBJECTIVE STATEMENT:  pain in back this morning but then went to bathroom and felt fine. Post leg pain butt to knee-stinging  PERTINENT HISTORY:  Meningioma, removed, R BG infarct, old  PAIN:  Are you having pain? Yes: NPRS scale: 3-8/10 Pain location: Posterior legs to knees Pain description: stinging Aggravating factors: walking Relieving factors: sitting  PRECAUTIONS: None  WEIGHT BEARING RESTRICTIONS: No  FALLS:  Has patient fallen in last 6 months? No  LIVING ENVIRONMENT: Lives with: lives alone Lives in: House/apartment Stairs: Yes: External: 2 steps; on right going up Has following equipment at home: Single point cane and Walker - 2 wheeled  OCCUPATION: Receptionist, sitting, full time  PLOF: Independent  PATIENT GOALS: Decrease pain, be able to move around without pain   OBJECTIVE:   DIAGNOSTIC FINDINGS:  Lumbar spinal stenosis  PATIENT SURVEYS:  FOTO 40.26  SCREENING FOR RED FLAGS: Bowel or bladder incontinence: No Spinal tumors: No Cauda equina syndrome: No Compression fracture: No Abdominal aneurysm: No  COGNITION: Overall cognitive status: Within functional limits  for tasks assessed    MUSCLE LENGTH: Hamstrings: Right 82 deg; Left 84 deg B hip flexors appear tight  POSTURE: increased lumbar lordosis, decreased thoracic kyphosis, right pelvic obliquity, and R shoulder elevated RLE longer than L 3/4"  PALPATION: N TTP in back or buttocks or legs  LUMBAR ROM:   AROM eval  Flexion WNL  Extension Severely limited  Right lateral flexion WNL  Left lateral flexion 3" above knee  Right rotation 30%  Left rotation 40%   (Blank rows = not tested)  LOWER EXTREMITY ROM:    WNL B  LOWER EXTREMITY MMT:    MMT Right eval Left eval  Hip flexion 3+ 3+  Hip extension 3- 3-  Hip abduction 3 3  Hip adduction    Hip internal rotation    Hip external rotation    Knee flexion 4- 4-  Knee extension 3+ 3+  Ankle dorsiflexion 4 4  Ankle plantarflexion    Ankle inversion    Ankle eversion     (Blank rows = not tested)  LUMBAR SPECIAL TESTS:  Straight leg raise test: Negative and Slump test: Negative  FUNCTIONAL TESTS:  5 times sit to stand: 19.26 Timed up and go (TUG): 14.50 Functional gait assessment: TBD  GAIT: Distance walked: 23'  Assistive device utilized: Single point cane and it was set too tall, but she declined to lower it. Level of assistance: Modified independence Comments: Patient declined to adjust cane height.  TODAY'S TREATMENT:                                                                                                                              DATE:   08/26/22 Nustep L 5 6 min PROM/stretching LE and trunk Feet on ball bridge, KTC and obl 15 x each. Isometric abd ball squeeze 15 x Blue tband clams and hip flex 15 x each Blue tband trunk rotation  15 x each Seated HS curl blue tband 2 sets 10 Seated trunk ext tband 2 sets 10 Seated trunk rotation blue tband 10 x each STS feet on airex, no UE 2 sets 5 CGA Seated row and lat pull 2 sets 10      08/23/22 NuStep L4 x 6 minutes Supine bridging x 10 reps SLR x  10 each leg Clamshells with G tband resistance x 10 Lower Trunk Rotation Glut stretch in supine Sidelying hip abd, 5 reps each side Seated HS stretch Standing side step with red tband resistance, BUE support on counter Standing shoulder ext and Rows with Red Tband-2 x 10 reps Standing shoulder ER red tband resistance 2 x 10   08/12/22 NuStep L5 x68mns  Calf stretch 30s Calf raises 2x10 Rows and Lats 15# 2x10  Shoulder ext 5# 2x10 Standing 2 way hip abd/ext 2# 2x10 Heel taps 6" w/2# 2x10- CGA  Walking w/o AD 1 big lap  blackTB ext 2x10  08/09/22 NuStep L5 x630ms Step ups 4" with HHA Leg ext 5# 2x10 HS curls 15# 2x10 Step ups on airex- minA  S2S on airex with chest press redball 2x10 Side steps over obstacles Calf stretch 30s on bar Calf raises 2x10 on bar  08/05/22 Nustep L 4 6 min Knee ext 5# 2 sets 10 HS curl 15# 2 sets 10 HS stretch , active sitting 3 x each side 15 sec hold STS with wt ball chest press 10 x STS on airex 10 x HHA on airex 20 x Alt  marching,hip flex,ext and abd CG-min A. ABD very challenging Ball btwn the knees squeeze 15 Seated green tband hip abd and flex 2 sets 10 Amb without AD 2 laps 300 feet 1 min 51 sec  08/02/22 NuStep L5x6m37m S2S 2x10 4" heel taps SBA   LAQ 2x10  HS curls greenTB 2x10 Hip abd greenTB 2x10 seated 2x10 Rows and ext greenTB 2x10  PATIENT EDUCATION:  Education details: POC Person educated: Patient Education method: Explanation Education comprehension: verbalized understanding  HOME EXERCISE PROGRAM:   [id number]:  CLINICAL IMPRESSION: pt reports increased posterior leg pain today. Minimal LE and trunk tightness. C/o posterior thigh ain with some ex but tolerated well. Addressed goals and func testing. Pt is progressing but does need some cuing for posture   OBJECTIVE IMPAIRMENTS: Abnormal gait, decreased activity tolerance, decreased balance, decreased coordination, decreased endurance, decreased  mobility, difficulty walking, decreased ROM, decreased strength, increased muscle spasms, impaired flexibility, improper body mechanics, postural dysfunction, and pain.   ACTIVITY LIMITATIONS: carrying, lifting, bending, standing, squatting, sleeping, stairs, transfers, and locomotion level  PARTICIPATION LIMITATIONS: meal prep, cleaning, laundry, interpersonal relationship, driving, shopping, and occupation  PERSONAL FACTORS: Age, Past/current experiences, and 1 comorbidity: Meningioma  are also affecting patient's functional outcome.   REHAB POTENTIAL: Good  CLINICAL DECISION MAKING: Evolving/moderate complexity  EVALUATION COMPLEXITY: Moderate   GOALS: Goals reviewed with patient? Yes  SHORT TERM GOALS: Target date: 08/23/2022  I with basic HEP Baseline: Goal status: met 08/26/22  LONG TERM GOALS: Target date: 10/18/2022  I with final HEP Baseline:  Goal status: INITIAL  2.  Increase FOTO score to at least 52 Baseline: 40 Goal status: ongoing  3.  Decrease TUG14.5 time to < 12 Baseline:  Goal status: 08/26/22 with SPC 9.58 sec MET  4.  Decrease 5 x STS to < 12 sec Baseline: 19 Goal status: 08/26/22 12.47sec progressing  5.  Patient will walk x at least 300' with LRAD, MI, no  pain in legs Baseline:  Goal status: INITIAL  6.  Assess and update sitting posture in her workspace to provide adequate trunk support. Baseline:  Goal status: INITIAL  PLAN:  PT FREQUENCY: 1-2x/week  PT DURATION: 12 weeks  PLANNED INTERVENTIONS: Therapeutic exercises, Therapeutic activity, Neuromuscular re-education, Balance training, Gait training, Patient/Family education, Self Care, Joint mobilization, Stair training, Dry Needling, Electrical stimulation, Cryotherapy, Moist heat, Ionotophoresis 71m/ml Dexamethasone, and Manual therapy.  PLAN FOR NEXT SESSION: progress func strength Patient Details  Name: Olivia CHEONGMRN: [id number] of Birth: 709-20-1957Referring  Provider:  PSeward Carol MD  Encounter Date: 08/26/2022   SEthel RanaDPT 08/26/22 4:57 PM  08/26/2022, 4:57 PM  [place] [address]Phone: [phone]  Fax:  [address] [address]Phone: [phone]  Fax:  [phone] Patient Details  Name: Olivia PELLERITOMRN: [id number] of Birth: 705/06/1957Referring Provider:  PSeward Carol MD  Encounter Date: 08/26/2022   PLaqueta Carina PTA 08/26/2022, 4:57 PM  [place] [address]Phone: [phone]  Fax:  [phone]

## 2022-09-02 ENCOUNTER — Ambulatory Visit: Payer: Medicare Other | Attending: Internal Medicine | Admitting: Physical Therapy

## 2022-09-02 DIAGNOSIS — G8929 Other chronic pain: Secondary | ICD-10-CM | POA: Insufficient documentation

## 2022-09-02 DIAGNOSIS — R252 Cramp and spasm: Secondary | ICD-10-CM | POA: Diagnosis present

## 2022-09-02 DIAGNOSIS — R262 Difficulty in walking, not elsewhere classified: Secondary | ICD-10-CM

## 2022-09-02 DIAGNOSIS — M6281 Muscle weakness (generalized): Secondary | ICD-10-CM

## 2022-09-02 DIAGNOSIS — R2681 Unsteadiness on feet: Secondary | ICD-10-CM | POA: Diagnosis present

## 2022-09-02 DIAGNOSIS — M5441 Lumbago with sciatica, right side: Secondary | ICD-10-CM | POA: Insufficient documentation

## 2022-09-02 DIAGNOSIS — M5459 Other low back pain: Secondary | ICD-10-CM | POA: Diagnosis present

## 2022-09-02 DIAGNOSIS — M5442 Lumbago with sciatica, left side: Secondary | ICD-10-CM | POA: Diagnosis present

## 2022-09-02 NOTE — Therapy (Signed)
OUTPATIENT PHYSICAL THERAPY THORACOLUMBAR TREATMENT   Patient Name: Olivia Shah MRN: [id number] DOB:10-12-1955, 66 y.o., female Today's Date: 09/02/2022   PT End of Session - 09/02/22 1650     Visit Number 8    Date for PT Re-Evaluation 10/18/22    PT Start Time 1700    PT Stop Time 1745    PT Time Calculation (min) 45 min              Past Medical History:  Diagnosis Date   Arthritis    Diabetes mellitus without complication ([place])    Hypertension    Past Surgical History:  Procedure Laterality Date   APPLICATION OF CRANIAL NAVIGATION N/A 05/18/2021   Procedure: APPLICATION OF CRANIAL NAVIGATION;  Surgeon: Judith Part, MD;  Location: [facility];  Service: Neurosurgery;  Laterality: N/A;   BREAST BIOPSY Right    CHOLECYSTECTOMY     CRANIOTOMY Right 05/18/2021   Procedure: Right craniotomy for tumor resection;  Surgeon: Judith Part, MD;  Location: [facility];  Service: Neurosurgery;  Laterality: Right;   EYE SURGERY     as a child   HERNIA REPAIR     Patient Active Problem List   Diagnosis Date Noted   Abnormal findings on diagnostic imaging of other specified body structures 01/14/2022   Chronic pain 01/14/2022   Colon cancer screening 01/14/2022   Dysphagia 01/14/2022   Family history of malignant neoplasm of digestive organs 01/14/2022   Hyperglycemia due to type 2 diabetes mellitus ([place]) 01/14/2022   Hypertension 01/14/2022   Meralgia paresthetica 01/14/2022   Mixed hyperlipidemia 01/14/2022   Morbid obesity ([place]) 01/14/2022   Overweight 01/14/2022   Pure hypercholesterolemia 01/14/2022   Spinal cord disease ([place]) 01/14/2022   Chronic cough 12/01/2021   Diabetes ([place]) 07/08/2021   Glaucoma 07/08/2021   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   Visual field defect of left eye 07/08/2021   Debility 06/16/2021   Hypokalemia    Seizures (HCC)    Benign essential HTN    Controlled type 2 diabetes mellitus with hyperglycemia, without long-term current use of  insulin ([place])    Stridor 06/05/2021   Status post tracheostomy ([place])    Acute respiratory failure with hypoxia ([place])    Status epilepticus ([place])    Brain tumor ([place]) 05/18/2021    PCP: Seward Carol, MD   REFERRING PROVIDER: Seward Carol, MD   REFERRING DIAG: Diagnosis M48.061 (ICD-10-CM) - Spinal stenosis, lumbar region without neurogenic claudication   Rationale for Evaluation and Treatment: Rehabilitation  THERAPY DIAG:  Muscle weakness (generalized)  Unsteadiness on feet  Other low back pain  Difficulty in walking, not elsewhere classified  ONSET DATE: 07/06/2022   SUBJECTIVE:  SUBJECTIVE STATEMENT:  stopped taking statins for cholesterol and leg pain much much better, working with MD to adjust eds   PERTINENT HISTORY:  Meningioma, removed, R BG infarct, old  PAIN:  Are you having pain? Yes 3/10 posterior legs  PRECAUTIONS: None  WEIGHT BEARING RESTRICTIONS: No  FALLS:  Has patient fallen in last 6 months? No  LIVING ENVIRONMENT: Lives with: lives alone Lives in: House/apartment Stairs: Yes: External: 2 steps; on right going up Has following equipment at home: Single point cane and Walker - 2 wheeled  OCCUPATION: Receptionist, sitting, full time  PLOF: Independent  PATIENT GOALS: Decrease pain, be able to move around without pain   OBJECTIVE:   DIAGNOSTIC FINDINGS:  Lumbar spinal stenosis  PATIENT SURVEYS:  FOTO 40.26  SCREENING FOR RED FLAGS: Bowel or bladder incontinence: No Spinal tumors: No Cauda equina syndrome: No Compression fracture: No Abdominal aneurysm: No  COGNITION: Overall cognitive status: Within functional limits for tasks assessed    MUSCLE LENGTH: Hamstrings: Right 82 deg; Left 84 deg B hip flexors appear tight  POSTURE: increased  lumbar lordosis, decreased thoracic kyphosis, right pelvic obliquity, and R shoulder elevated RLE longer than L 3/4"  PALPATION: N TTP in back or buttocks or legs  LUMBAR ROM:   AROM eval 09/02/22  Flexion WNL WNLs  Extension Severely limited Decreased 90%  Right lateral flexion WNL WFLs  Left lateral flexion 3" above knee WFLS  Right rotation 30% WFLs  Left rotation 40% WFLs   (Blank rows = not tested)  LOWER EXTREMITY ROM:    WNL B  LOWER EXTREMITY MMT:    MMT Right eval Left eval RT/Left  09/02/22  Hip flexion 3+ 3+ 4/4  Hip extension 3- 3- 4/4  Hip abduction 3 3 4/4  Hip adduction     Hip internal rotation     Hip external rotation     Knee flexion 4- 4-   Knee extension 3+ 3+   Ankle dorsiflexion 4 4   Ankle plantarflexion     Ankle inversion     Ankle eversion      (Blank rows = not tested)  LUMBAR SPECIAL TESTS:  Straight leg raise test: Negative and Slump test: Negative  FUNCTIONAL TESTS:  5 times sit to stand: 19.26   12 /7/23 12.3 sec Timed up and go (TUG): 14.50  12/7/ 23 no AD 8.1 sec Functional gait assessment: TBD  GAIT: Distance walked: 34'  Assistive device utilized: Single point cane and it was set too tall, but she declined to lower it. Level of assistance: Modified independence Comments: Patient declined to adjust cane height.  TODAY'S TREATMENT:                                                                                                                              DATE:   09/02/22 Nustep L 5 7 min HS curl 25# 3 sets 10 Knee ext 5# 3 sets 10 (  attempted 10 # but too heavy) Black tband trunk flex and ext 2 sets 10 Seated row 20# 2 sets 10 STS on airex 10x 30# resisted gait 5 x 4 ways Green tband shld ext 2 sets 10 Green tband trunk ext 2 sets 10   08/26/22 Nustep L 5 6 min PROM/stretching LE and trunk Feet on ball bridge, KTC and obl 15 x each. Isometric abd ball squeeze 15 x Blue tband clams and hip flex 15 x each Blue  tband trunk rotation 15 x each Seated HS curl blue tband 2 sets 10 Seated trunk ext tband 2 sets 10 Seated trunk rotation blue tband 10 x each STS feet on airex, no UE 2 sets 5 CGA Seated row and lat pull 2 sets 10      08/23/22 NuStep L4 x 6 minutes Supine bridging x 10 reps SLR x 10 each leg Clamshells with G tband resistance x 10 Lower Trunk Rotation Glut stretch in supine Sidelying hip abd, 5 reps each side Seated HS stretch Standing side step with red tband resistance, BUE support on counter Standing shoulder ext and Rows with Red Tband-2 x 10 reps Standing shoulder ER red tband resistance 2 x 10   08/12/22 NuStep L5 x24mns  Calf stretch 30s Calf raises 2x10 Rows and Lats 15# 2x10  Shoulder ext 5# 2x10 Standing 2 way hip abd/ext 2# 2x10 Heel taps 6" w/2# 2x10- CGA  Walking w/o AD 1 big lap  blackTB ext 2x10  08/09/22 NuStep L5 x655ms Step ups 4" with HHA Leg ext 5# 2x10 HS curls 15# 2x10 Step ups on airex- minA  S2S on airex with chest press redball 2x10 Side steps over obstacles Calf stretch 30s on bar Calf raises 2x10 on bar  08/05/22 Nustep L 4 6 min Knee ext 5# 2 sets 10 HS curl 15# 2 sets 10 HS stretch , active sitting 3 x each side 15 sec hold STS with wt ball chest press 10 x STS on airex 10 x HHA on airex 20 x Alt  marching,hip flex,ext and abd CG-min A. ABD very challenging Ball btwn the knees squeeze 15 Seated green tband hip abd and flex 2 sets 10 Amb without AD 2 laps 300 feet 1 min 51 sec  08/02/22 NuStep L5x6m42m S2S 2x10 4" heel taps SBA   LAQ 2x10  HS curls greenTB 2x10 Hip abd greenTB 2x10 seated 2x10 Rows and ext greenTB 2x10  PATIENT EDUCATION:  Education details: POC Person educated: Patient Education method: Explanation Education comprehension: verbalized understanding  HOME EXERCISE PROGRAM:   [id number]:  CLINICAL IMPRESSION: assessed TUG and STS goals met. Reviewed all other goals and progressing.  Progressed strengthening for LE and core, fatigue noted in LE as session progressed and cuing needed to not flex trunk. Several LOBinstability but able ot right herself  OBJECTIVE IMPAIRMENTS: Abnormal gait, decreased activity tolerance, decreased balance, decreased coordination, decreased endurance, decreased mobility, difficulty walking, decreased ROM, decreased strength, increased muscle spasms, impaired flexibility, improper body mechanics, postural dysfunction, and pain.   ACTIVITY LIMITATIONS: carrying, lifting, bending, standing, squatting, sleeping, stairs, transfers, and locomotion level  PARTICIPATION LIMITATIONS: meal prep, cleaning, laundry, interpersonal relationship, driving, shopping, and occupation  PERSONAL FACTORS: Age, Past/current experiences, and 1 comorbidity: Meningioma  are also affecting patient's functional outcome.   REHAB POTENTIAL: Good  CLINICAL DECISION MAKING: Evolving/moderate complexity  EVALUATION COMPLEXITY: Moderate   GOALS: Goals reviewed with patient? Yes  SHORT TERM GOALS: Target date: 08/23/2022  I with basic HEP  Baseline: Goal status: met 08/26/22  LONG TERM GOALS: Target date: 10/18/2022  I with final HEP Baseline:  Goal status: 09/02/22 progressing  2.  Increase FOTO score to at least 52 Baseline: 40 Goal status: ongoing  3.  Decrease TUG14.5 time to < 12 Baseline:  Goal status: 08/26/22 with SPC 9.58 sec MET  4.  Decrease 5 x STS to < 12 sec Baseline: 19 Goal status: 08/26/22 12.47sec progressing. 09/02/22 MET  5.  Patient will walk x at least 300' with LRAD, MI, no pain in legs Baseline:  Goal status: INITIAL  6.  Assess and update sitting posture in her workspace to provide adequate trunk support. Baseline:  Goal status: INITIAL  PLAN:  PT FREQUENCY: 1-2x/week  PT DURATION: 12 weeks  PLANNED INTERVENTIONS: Therapeutic exercises, Therapeutic activity, Neuromuscular re-education, Balance training, Gait training,  Patient/Family education, Self Care, Joint mobilization, Stair training, Dry Needling, Electrical stimulation, Cryotherapy, Moist heat, Ionotophoresis 66m/ml Dexamethasone, and Manual therapy.  PLAN FOR NEXT SESSION: progress func strength Patient Details  Name: JJAYLANI MCGUINNMRN: [id number] of Birth: [birth year]Referring Provider:  PSeward Carol MD  Encounter Date: 09/02/2022   AFranki MontePTA 09/02/22 4:51 PM  09/02/2022, 4:51 PM  [place] [address]Phone: [phone]  Fax:  [address] [address]Phone: [phone]  Fax:  [phone] Patient Details  Name: JROYALTI SCHAUFMRN: [id number] of Birth: [birth year]Referring Provider:  PSeward Carol MD  Encounter Date: 09/02/2022   PLaqueta Carina PTA 09/02/2022, 4:51 PM  [place] [address]Phone: [phone]  Fax:  3[place] [address]Phone: [phone]  Fax:  [phone] Patient Details  Name: JEVGENIA MERRIMANMRN: [id number] of Birth: 707/06/1957Referring Provider:  PSeward Carol MD  Encounter Date: 09/02/2022   PLaqueta Carina PTA 09/02/2022, 4:51 PM  [place] [address]Phone: [phone]  Fax:  [phone]

## 2022-09-06 ENCOUNTER — Encounter: Payer: Self-pay | Admitting: Physical Therapy

## 2022-09-06 ENCOUNTER — Ambulatory Visit: Payer: Medicare Other | Admitting: Physical Therapy

## 2022-09-06 DIAGNOSIS — M5459 Other low back pain: Secondary | ICD-10-CM

## 2022-09-06 DIAGNOSIS — M6281 Muscle weakness (generalized): Secondary | ICD-10-CM | POA: Diagnosis not present

## 2022-09-06 DIAGNOSIS — R2681 Unsteadiness on feet: Secondary | ICD-10-CM

## 2022-09-06 DIAGNOSIS — R262 Difficulty in walking, not elsewhere classified: Secondary | ICD-10-CM

## 2022-09-06 DIAGNOSIS — R252 Cramp and spasm: Secondary | ICD-10-CM

## 2022-09-06 DIAGNOSIS — G8929 Other chronic pain: Secondary | ICD-10-CM

## 2022-09-06 NOTE — Therapy (Signed)
OUTPATIENT PHYSICAL THERAPY THORACOLUMBAR TREATMENT   Patient Name: Olivia Shah MRN: [id number] DOB:03-27-1956, 66 y.o., female Today's Date: 09/06/2022   PT End of Session - 09/06/22 1138     Visit Number 9    Date for PT Re-Evaluation 10/18/22    PT Start Time 1140    PT Stop Time 1225    PT Time Calculation (min) 45 min    Activity Tolerance Patient tolerated treatment well    Behavior During Therapy [facility] for tasks assessed/performed              Past Medical History:  Diagnosis Date   Arthritis    Diabetes mellitus without complication ([place])    Hypertension    Past Surgical History:  Procedure Laterality Date   APPLICATION OF CRANIAL NAVIGATION N/A 05/18/2021   Procedure: APPLICATION OF CRANIAL NAVIGATION;  Surgeon: Judith Part, MD;  Location: [place];  Service: Neurosurgery;  Laterality: N/A;   BREAST BIOPSY Right    CHOLECYSTECTOMY     CRANIOTOMY Right 05/18/2021   Procedure: Right craniotomy for tumor resection;  Surgeon: Judith Part, MD;  Location: [place];  Service: Neurosurgery;  Laterality: Right;   EYE SURGERY     as a child   HERNIA REPAIR     Patient Active Problem List   Diagnosis Date Noted   Abnormal findings on diagnostic imaging of other specified body structures 01/14/2022   Chronic pain 01/14/2022   Colon cancer screening 01/14/2022   Dysphagia 01/14/2022   Family history of malignant neoplasm of digestive organs 01/14/2022   Hyperglycemia due to type 2 diabetes mellitus ([place]) 01/14/2022   Hypertension 01/14/2022   Meralgia paresthetica 01/14/2022   Mixed hyperlipidemia 01/14/2022   Morbid obesity ([place]) 01/14/2022   Overweight 01/14/2022   Pure hypercholesterolemia 01/14/2022   Spinal cord disease ([place]) 01/14/2022   Chronic cough 12/01/2021   Diabetes ([place]) 07/08/2021   Glaucoma 07/08/2021   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   Visual field defect of left eye 07/08/2021   Debility 06/16/2021   Hypokalemia    Seizures (HCC)     Benign essential HTN    Controlled type 2 diabetes mellitus with hyperglycemia, without long-term current use of insulin ([place])    Stridor 06/05/2021   Status post tracheostomy ([place])    Acute respiratory failure with hypoxia ([place])    Status epilepticus ([place])    Brain tumor ([place]) 05/18/2021    PCP: Seward Carol, MD   REFERRING PROVIDER: Seward Carol, MD   REFERRING DIAG: Diagnosis M48.061 (ICD-10-CM) - Spinal stenosis, lumbar region without neurogenic claudication   Rationale for Evaluation and Treatment: Rehabilitation  THERAPY DIAG:  Muscle weakness (generalized)  Unsteadiness on feet  Other low back pain  Difficulty in walking, not elsewhere classified  Chronic bilateral low back pain with bilateral sciatica  Cramp and spasm  ONSET DATE: 07/06/2022   SUBJECTIVE:  SUBJECTIVE STATEMENT:  "Just the back of my legs"    PERTINENT HISTORY:  Meningioma, removed, R BG infarct, old  PAIN:  Are you having pain? Yes 5/10 posterior legs  PRECAUTIONS: None  WEIGHT BEARING RESTRICTIONS: No  FALLS:  Has patient fallen in last 6 months? No  LIVING ENVIRONMENT: Lives with: lives alone Lives in: House/apartment Stairs: Yes: External: 2 steps; on right going up Has following equipment at home: Single point cane and Walker - 2 wheeled  OCCUPATION: Receptionist, sitting, full time  PLOF: Independent  PATIENT GOALS: Decrease pain, be able to move around without pain   OBJECTIVE:   DIAGNOSTIC FINDINGS:  Lumbar spinal stenosis  PATIENT SURVEYS:  FOTO 40.26  SCREENING FOR RED FLAGS: Bowel or bladder incontinence: No Spinal tumors: No Cauda equina syndrome: No Compression fracture: No Abdominal aneurysm: No  COGNITION: Overall cognitive status: Within functional limits for  tasks assessed    MUSCLE LENGTH: Hamstrings: Right 82 deg; Left 84 deg B hip flexors appear tight  POSTURE: increased lumbar lordosis, decreased thoracic kyphosis, right pelvic obliquity, and R shoulder elevated RLE longer than L 3/4"  PALPATION: N TTP in back or buttocks or legs  LUMBAR ROM:   AROM eval 09/02/22  Flexion WNL WNLs  Extension Severely limited Decreased 90%  Right lateral flexion WNL WFLs  Left lateral flexion 3" above knee WFLS  Right rotation 30% WFLs  Left rotation 40% WFLs   (Blank rows = not tested)  LOWER EXTREMITY ROM:    WNL B  LOWER EXTREMITY MMT:    MMT Right eval Left eval RT/Left  09/02/22  Hip flexion 3+ 3+ 4/4  Hip extension 3- 3- 4/4  Hip abduction 3 3 4/4  Hip adduction     Hip internal rotation     Hip external rotation     Knee flexion 4- 4-   Knee extension 3+ 3+   Ankle dorsiflexion 4 4   Ankle plantarflexion     Ankle inversion     Ankle eversion      (Blank rows = not tested)  LUMBAR SPECIAL TESTS:  Straight leg raise test: Negative and Slump test: Negative  FUNCTIONAL TESTS:  5 times sit to stand: 19.26   12 /7/23 12.3 sec Timed up and go (TUG): 14.50  12/7/ 23 no AD 8.1 sec Functional gait assessment: TBD  GAIT: Distance walked: 2'  Assistive device utilized: Single point cane and it was set too tall, but she declined to lower it. Level of assistance: Modified independence Comments: Patient declined to adjust cane height.  TODAY'S TREATMENT:                                                                                                                              DATE:  09/06/22 NuStep L 5 x 6 min  Hamstring Curls 20lb 3x10  Leg Ext 5lb 3x10 Shoulder Ext 5lb 2x10  Standing rows 10lb 2x10 Sit to stand 2x10  Supine bridges 2x10 PROM/stretching LE and trunk   09/02/22 Nustep L 5 7 min HS curl 25# 3 sets 10 Knee ext 5# 3 sets 10 ( attempted 10 # but too heavy) Black tband trunk flex and ext 2 sets  10 Seated row 20# 2 sets 10 STS on airex 10x 30# resisted gait 5 x 4 ways Green tband shld ext 2 sets 10 Green tband trunk ext 2 sets 10   08/26/22 Nustep L 5 6 min PROM/stretching LE and trunk Feet on ball bridge, KTC and obl 15 x each. Isometric abd ball squeeze 15 x Blue tband clams and hip flex 15 x each Blue tband trunk rotation 15 x each Seated HS curl blue tband 2 sets 10 Seated trunk ext tband 2 sets 10 Seated trunk rotation blue tband 10 x each STS feet on airex, no UE 2 sets 5 CGA Seated row and lat pull 2 sets 10   08/23/22 NuStep L4 x 6 minutes Supine bridging x 10 reps SLR x 10 each leg Clamshells with G tband resistance x 10 Lower Trunk Rotation Glut stretch in supine Sidelying hip abd, 5 reps each side Seated HS stretch Standing side step with red tband resistance, BUE support on counter Standing shoulder ext and Rows with Red Tband-2 x 10 reps Standing shoulder ER red tband resistance 2 x 10   08/12/22 NuStep L5 x80mns  Calf stretch 30s Calf raises 2x10 Rows and Lats 15# 2x10  Shoulder ext 5# 2x10 Standing 2 way hip abd/ext 2# 2x10 Heel taps 6" w/2# 2x10- CGA  Walking w/o AD 1 big lap  blackTB ext 2x10  08/09/22 NuStep L5 x682ms Step ups 4" with HHA Leg ext 5# 2x10 HS curls 15# 2x10 Step ups on airex- minA  S2S on airex with chest press redball 2x10 Side steps over obstacles Calf stretch 30s on bar Calf raises 2x10 on bar  08/05/22 Nustep L 4 6 min Knee ext 5# 2 sets 10 HS curl 15# 2 sets 10 HS stretch , active sitting 3 x each side 15 sec hold STS with wt ball chest press 10 x STS on airex 10 x HHA on airex 20 x Alt  marching,hip flex,ext and abd CG-min A. ABD very challenging Ball btwn the knees squeeze 15 Seated green tband hip abd and flex 2 sets 10 Amb without AD 2 laps 300 feet 1 min 51 sec  08/02/22 NuStep L5x6m62m S2S 2x10 4" heel taps SBA   LAQ 2x10  HS curls greenTB 2x10 Hip abd greenTB 2x10 seated 2x10 Rows and ext  greenTB 2x10  PATIENT EDUCATION:  Education details: POC Person educated: Patient Education method: Explanation Education comprehension: verbalized understanding  HOME EXERCISE PROGRAM:   [id number]:  CLINICAL IMPRESSION: Pt enters with some pain behind the legs. Progressed strengthening for LE and core, fatigue noted in LE as session progressed. Postural cue reuired with standing rows and extensions. Some core weakness present with shoulder Ext. She had good HS flexibility with stretching.   OBJECTIVE IMPAIRMENTS: Abnormal gait, decreased activity tolerance, decreased balance, decreased coordination, decreased endurance, decreased mobility, difficulty walking, decreased ROM, decreased strength, increased muscle spasms, impaired flexibility, improper body mechanics, postural dysfunction, and pain.   ACTIVITY LIMITATIONS: carrying, lifting, bending, standing, squatting, sleeping, stairs, transfers, and locomotion level  PARTICIPATION LIMITATIONS: meal prep, cleaning, laundry, interpersonal relationship, driving, shopping, and occupation  PERSONAL FACTORS: Age, Past/current experiences, and 1 comorbidity: Meningioma  are also affecting patient's functional outcome.   REHAB POTENTIAL: Good  CLINICAL DECISION MAKING: Evolving/moderate complexity  EVALUATION COMPLEXITY: Moderate   GOALS: Goals reviewed with patient? Yes  SHORT TERM GOALS: Target date: 08/23/2022  I with basic HEP Baseline: Goal status: met 08/26/22  LONG TERM GOALS: Target date: 10/18/2022  I with final HEP Baseline:  Goal status: 09/02/22 progressing  2.  Increase FOTO score to at least 52 Baseline: 40 Goal status: ongoing  3.  Decrease TUG14.5 time to < 12 Baseline:  Goal status: 08/26/22 with SPC 9.58 sec MET  4.  Decrease 5 x STS to < 12 sec Baseline: 19 Goal status: 08/26/22 12.47sec progressing. 09/02/22 MET  5.  Patient will walk x at least 300' with LRAD, MI, no pain in  legs Baseline:  Goal status: INITIAL  6.  Assess and update sitting posture in her workspace to provide adequate trunk support. Baseline:  Goal status: INITIAL  PLAN:  PT FREQUENCY: 1-2x/week  PT DURATION: 12 weeks  PLANNED INTERVENTIONS: Therapeutic exercises, Therapeutic activity, Neuromuscular re-education, Balance training, Gait training, Patient/Family education, Self Care, Joint mobilization, Stair training, Dry Needling, Electrical stimulation, Cryotherapy, Moist heat, Ionotophoresis 60m/ml Dexamethasone, and Manual therapy.  PLAN FOR NEXT SESSION: progress func strength   RScot Jun PTA 09/06/2022, 11:39 AM

## 2022-09-13 ENCOUNTER — Ambulatory Visit: Payer: Medicare Other | Admitting: Physical Therapy

## 2022-09-13 ENCOUNTER — Encounter: Payer: Self-pay | Admitting: Physical Therapy

## 2022-09-13 DIAGNOSIS — M6281 Muscle weakness (generalized): Secondary | ICD-10-CM | POA: Diagnosis not present

## 2022-09-13 DIAGNOSIS — R262 Difficulty in walking, not elsewhere classified: Secondary | ICD-10-CM

## 2022-09-13 DIAGNOSIS — G8929 Other chronic pain: Secondary | ICD-10-CM

## 2022-09-13 DIAGNOSIS — R2681 Unsteadiness on feet: Secondary | ICD-10-CM

## 2022-09-13 DIAGNOSIS — M5459 Other low back pain: Secondary | ICD-10-CM

## 2022-09-13 NOTE — Therapy (Signed)
OUTPATIENT PHYSICAL THERAPY THORACOLUMBAR TREATMENT  Progress Note Reporting Period 07/26/22 to 09/13/22  See note below for Objective Data and Assessment of Progress/Goals.     Patient Name: Olivia Shah MRN: [id number] DOB:1956-05-08, 66 y.o., female Today's Date: 09/13/2022   PT End of Session - 09/13/22 1147     Visit Number 10    Date for PT Re-Evaluation 10/18/22    PT Start Time 1145    PT Stop Time 1230    PT Time Calculation (min) 45 min    Activity Tolerance Patient tolerated treatment well    Behavior During Therapy [facility] for tasks assessed/performed              Past Medical History:  Diagnosis Date   Arthritis    Diabetes mellitus without complication ([place])    Hypertension    Past Surgical History:  Procedure Laterality Date   APPLICATION OF CRANIAL NAVIGATION N/A 05/18/2021   Procedure: APPLICATION OF CRANIAL NAVIGATION;  Surgeon: Judith Part, MD;  Location: [place];  Service: Neurosurgery;  Laterality: N/A;   BREAST BIOPSY Right    CHOLECYSTECTOMY     CRANIOTOMY Right 05/18/2021   Procedure: Right craniotomy for tumor resection;  Surgeon: Judith Part, MD;  Location: [place];  Service: Neurosurgery;  Laterality: Right;   EYE SURGERY     as a child   HERNIA REPAIR     Patient Active Problem List   Diagnosis Date Noted   Abnormal findings on diagnostic imaging of other specified body structures 01/14/2022   Chronic pain 01/14/2022   Colon cancer screening 01/14/2022   Dysphagia 01/14/2022   Family history of malignant neoplasm of digestive organs 01/14/2022   Hyperglycemia due to type 2 diabetes mellitus ([place]) 01/14/2022   Hypertension 01/14/2022   Meralgia paresthetica 01/14/2022   Mixed hyperlipidemia 01/14/2022   Morbid obesity ([place]) 01/14/2022   Overweight 01/14/2022   Pure hypercholesterolemia 01/14/2022   Spinal cord disease ([place]) 01/14/2022   Chronic cough 12/01/2021   Diabetes ([place]) 07/08/2021   Glaucoma 07/08/2021    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   Visual field defect of left eye 07/08/2021   Debility 06/16/2021   Hypokalemia    Seizures (HCC)    Benign essential HTN    Controlled type 2 diabetes mellitus with hyperglycemia, without long-term current use of insulin ([place])    Stridor 06/05/2021   Status post tracheostomy ([place])    Acute respiratory failure with hypoxia ([place])    Status epilepticus ([place])    Brain tumor ([place]) 05/18/2021    PCP: Seward Carol, MD   REFERRING PROVIDER: Seward Carol, MD   REFERRING DIAG: Diagnosis M48.061 (ICD-10-CM) - Spinal stenosis, lumbar region without neurogenic claudication   Rationale for Evaluation and Treatment: Rehabilitation  THERAPY DIAG:  Muscle weakness (generalized)  Unsteadiness on feet  Other low back pain  Difficulty in walking, not elsewhere classified  Chronic bilateral low back pain with bilateral sciatica  ONSET DATE: 07/06/2022   SUBJECTIVE:  SUBJECTIVE STATEMENT:  "All right, My legs was hurting yesterday, but I think it was cause of the rain"     PERTINENT HISTORY:  Meningioma, removed, R BG infarct, old  PAIN:  Are you having pain? Yes 0/10 posterior legs  PRECAUTIONS: None  WEIGHT BEARING RESTRICTIONS: No  FALLS:  Has patient fallen in last 6 months? No  LIVING ENVIRONMENT: Lives with: lives alone Lives in: House/apartment Stairs: Yes: External: 2 steps; on right going up Has following equipment at home: Single point cane and Walker - 2 wheeled  OCCUPATION: Receptionist, sitting, full time  PLOF: Independent  PATIENT GOALS: Decrease pain, be able to move around without pain   OBJECTIVE:   DIAGNOSTIC FINDINGS:  Lumbar spinal stenosis  PATIENT SURVEYS:  FOTO 40.26  SCREENING FOR RED FLAGS: Bowel or bladder incontinence:  No Spinal tumors: No Cauda equina syndrome: No Compression fracture: No Abdominal aneurysm: No  COGNITION: Overall cognitive status: Within functional limits for tasks assessed    MUSCLE LENGTH: Hamstrings: Right 82 deg; Left 84 deg B hip flexors appear tight  POSTURE: increased lumbar lordosis, decreased thoracic kyphosis, right pelvic obliquity, and R shoulder elevated RLE longer than L 3/4"  PALPATION: N TTP in back or buttocks or legs  LUMBAR ROM:   AROM eval 09/02/22 09/13/22  Flexion WNL WNLs WFL  Extension Severely limited Decreased 90% Decreased 50%  Right lateral flexion WNL WFLs WFL  Left lateral flexion 3" above knee WFLS WFL  Right rotation 30% WFLs WFL  Left rotation 40% WFLs WFL   (Blank rows = not tested)  LOWER EXTREMITY ROM:    WNL B  LOWER EXTREMITY MMT:    MMT Right eval Left eval RT/Left  09/02/22  Hip flexion 3+ 3+ 4/4  Hip extension 3- 3- 4/4  Hip abduction 3 3 4/4  Hip adduction     Hip internal rotation     Hip external rotation     Knee flexion 4- 4-   Knee extension 3+ 3+   Ankle dorsiflexion 4 4   Ankle plantarflexion     Ankle inversion     Ankle eversion      (Blank rows = not tested)  LUMBAR SPECIAL TESTS:  Straight leg raise test: Negative and Slump test: Negative  FUNCTIONAL TESTS:  5 times sit to stand: 19.26   12 /7/23 12.3 sec Timed up and go (TUG): 14.50  12/7/ 23 no AD 8.1 sec Functional gait assessment: TBD  GAIT: Distance walked: 42'  Assistive device utilized: Single point cane and it was set too tall, but she declined to lower it. Level of assistance: Modified independence Comments: Patient declined to adjust cane height.  TODAY'S TREATMENT:                                                                                                                              DATE:  09/13/22 NuStep L5 x5 min Foto Gait W/ SPC 360 feet  reports some stinging int he back of her legs Hamstring Curls 20lb 2x12  Leg Ext 5lb  2x12. S2S holding red ball 2x10   09/06/22 NuStep L 5 x 6 min  Hamstring Curls 20lb 3x10  Leg Ext 5lb 3x10 Shoulder Ext 5lb 2x10  Standing rows 10lb 2x10 Sit to stand 2x10  Supine bridges 2x10 PROM/stretching LE and trunk   09/02/22 Nustep L 5 7 min HS curl 25# 3 sets 10 Knee ext 5# 3 sets 10 ( attempted 10 # but too heavy) Black tband trunk flex and ext 2 sets 10 Seated row 20# 2 sets 10 STS on airex 10x 30# resisted gait 5 x 4 ways Green tband shld ext 2 sets 10 Green tband trunk ext 2 sets 10   08/26/22 Nustep L 5 6 min PROM/stretching LE and trunk Feet on ball bridge, KTC and obl 15 x each. Isometric abd ball squeeze 15 x Blue tband clams and hip flex 15 x each Blue tband trunk rotation 15 x each Seated HS curl blue tband 2 sets 10 Seated trunk ext tband 2 sets 10 Seated trunk rotation blue tband 10 x each STS feet on airex, no UE 2 sets 5 CGA Seated row and lat pull 2 sets 10   08/23/22 NuStep L4 x 6 minutes Supine bridging x 10 reps SLR x 10 each leg Clamshells with G tband resistance x 10 Lower Trunk Rotation Glut stretch in supine Sidelying hip abd, 5 reps each side Seated HS stretch Standing side step with red tband resistance, BUE support on counter Standing shoulder ext and Rows with Red Tband-2 x 10 reps Standing shoulder ER red tband resistance 2 x 10   08/12/22 NuStep L5 x95mns  Calf stretch 30s Calf raises 2x10 Rows and Lats 15# 2x10  Shoulder ext 5# 2x10 Standing 2 way hip abd/ext 2# 2x10 Heel taps 6" w/2# 2x10- CGA  Walking w/o AD 1 big lap  blackTB ext 2x10  08/09/22 NuStep L5 x668ms Step ups 4" with HHA Leg ext 5# 2x10 HS curls 15# 2x10 Step ups on airex- minA  S2S on airex with chest press redball 2x10 Side steps over obstacles Calf stretch 30s on bar Calf raises 2x10 on bar  08/05/22 Nustep L 4 6 min Knee ext 5# 2 sets 10 HS curl 15# 2 sets 10 HS stretch , active sitting 3 x each side 15 sec hold STS with wt ball  chest press 10 x STS on airex 10 x HHA on airex 20 x Alt  marching,hip flex,ext and abd CG-min A. ABD very challenging Ball btwn the knees squeeze 15 Seated green tband hip abd and flex 2 sets 10 Amb without AD 2 laps 300 feet 1 min 51 sec  08/02/22 NuStep L5x6m76m S2S 2x10 4" heel taps SBA   LAQ 2x10  HS curls greenTB 2x10 Hip abd greenTB 2x10 seated 2x10 Rows and ext greenTB 2x10  PATIENT EDUCATION:  Education details: POC Person educated: Patient Education method: Explanation Education comprehension: verbalized understanding  HOME EXERCISE PROGRAM:   [id number]:  CLINICAL IMPRESSION: Pt enters reporitn gposterior leg pain yesterday that she thinks came from the rain. Pt has progressed increasing her lumbar ROM. She completed gait distance for goal but did report some LE posterior leg stinging. Postural cue reuired with standing shoulder extensions. Some core weakness present with shoulder Ext.   OBJECTIVE IMPAIRMENTS: Abnormal gait, decreased activity tolerance, decreased balance, decreased coordination, decreased endurance, decreased mobility, difficulty walking, decreased ROM, decreased strength,  increased muscle spasms, impaired flexibility, improper body mechanics, postural dysfunction, and pain.   ACTIVITY LIMITATIONS: carrying, lifting, bending, standing, squatting, sleeping, stairs, transfers, and locomotion level  PARTICIPATION LIMITATIONS: meal prep, cleaning, laundry, interpersonal relationship, driving, shopping, and occupation  PERSONAL FACTORS: Age, Past/current experiences, and 1 comorbidity: Meningioma  are also affecting patient's functional outcome.   REHAB POTENTIAL: Good  CLINICAL DECISION MAKING: Evolving/moderate complexity  EVALUATION COMPLEXITY: Moderate   GOALS: Goals reviewed with patient? Yes  SHORT TERM GOALS: Target date: 08/23/2022  I with basic HEP Baseline: Goal status: met 08/26/22  LONG TERM GOALS: Target date:  10/18/2022  I with final HEP Baseline:  Goal status:  On going "Has not started"  2.  Increase FOTO score to at least 52 Baseline: 40 Goal status: Met  09/13/22   3.  Decrease TUG14.5 time to < 12 Baseline:  Goal status: 08/26/22 with SPC 9.58 sec MET  4.  Decrease 5 x STS to < 12 sec Baseline: 19 Goal status: 08/26/22 12.47sec progressing. 09/02/22 MET  5.  Patient will walk x at least 300' with LRAD, MI, no pain in legs Baseline:  Goal status: On going 09/13/22  6.  Assess and update sitting posture in her workspace to provide adequate trunk support. Baseline:  Goal status: Progressing 09/13/22  PLAN:  PT FREQUENCY: 1-2x/week  PT DURATION: 12 weeks  PLANNED INTERVENTIONS: Therapeutic exercises, Therapeutic activity, Neuromuscular re-education, Balance training, Gait training, Patient/Family education, Self Care, Joint mobilization, Stair training, Dry Needling, Electrical stimulation, Cryotherapy, Moist heat, Ionotophoresis 38m/ml Dexamethasone, and Manual therapy.  PLAN FOR NEXT SESSION: progress func strength  MAndris Baumann DPT RScot Jun PTA 09/13/2022, 11:49 AM

## 2022-09-16 ENCOUNTER — Ambulatory Visit: Payer: Medicare Other | Admitting: Physical Therapy

## 2022-09-16 DIAGNOSIS — M5459 Other low back pain: Secondary | ICD-10-CM

## 2022-09-16 DIAGNOSIS — R262 Difficulty in walking, not elsewhere classified: Secondary | ICD-10-CM

## 2022-09-16 DIAGNOSIS — M6281 Muscle weakness (generalized): Secondary | ICD-10-CM | POA: Diagnosis not present

## 2022-09-16 DIAGNOSIS — R2681 Unsteadiness on feet: Secondary | ICD-10-CM

## 2022-09-16 NOTE — Therapy (Signed)
OUTPATIENT PHYSICAL THERAPY THORACOLUMBAR TREATMENT    Patient Name: Olivia Shah MRN: [id number] DOB:1956/02/10, 66 y.o., female Today's Date: 09/16/2022   PT End of Session - 09/16/22 1654     Visit Number 11    Date for PT Re-Evaluation 10/18/22    PT Start Time 1650    PT Stop Time 1745    PT Time Calculation (min) 55 min              Past Medical History:  Diagnosis Date   Arthritis    Diabetes mellitus without complication ([place])    Hypertension    Past Surgical History:  Procedure Laterality Date   APPLICATION OF CRANIAL NAVIGATION N/A 05/18/2021   Procedure: APPLICATION OF CRANIAL NAVIGATION;  Surgeon: Judith Part, MD;  Location: [place];  Service: Neurosurgery;  Laterality: N/A;   BREAST BIOPSY Right    CHOLECYSTECTOMY     CRANIOTOMY Right 05/18/2021   Procedure: Right craniotomy for tumor resection;  Surgeon: Judith Part, MD;  Location: [place];  Service: Neurosurgery;  Laterality: Right;   EYE SURGERY     as a child   HERNIA REPAIR     Patient Active Problem List   Diagnosis Date Noted   Abnormal findings on diagnostic imaging of other specified body structures 01/14/2022   Chronic pain 01/14/2022   Colon cancer screening 01/14/2022   Dysphagia 01/14/2022   Family history of malignant neoplasm of digestive organs 01/14/2022   Hyperglycemia due to type 2 diabetes mellitus ([place]) 01/14/2022   Hypertension 01/14/2022   Meralgia paresthetica 01/14/2022   Mixed hyperlipidemia 01/14/2022   Morbid obesity ([place]) 01/14/2022   Overweight 01/14/2022   Pure hypercholesterolemia 01/14/2022   Spinal cord disease ([place]) 01/14/2022   Chronic cough 12/01/2021   Diabetes ([place]) 07/08/2021   Glaucoma 07/08/2021   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   Visual field defect of left eye 07/08/2021   Debility 06/16/2021   Hypokalemia    Seizures (HCC)    Benign essential HTN    Controlled type 2 diabetes mellitus with hyperglycemia, without long-term current use of  insulin ([place])    Stridor 06/05/2021   Status post tracheostomy ([place])    Acute respiratory failure with hypoxia ([place])    Status epilepticus ([place])    Brain tumor ([place]) 05/18/2021    PCP: Seward Carol, MD   REFERRING PROVIDER: Seward Carol, MD   REFERRING DIAG: Diagnosis M48.061 (ICD-10-CM) - Spinal stenosis, lumbar region without neurogenic claudication   Rationale for Evaluation and Treatment: Rehabilitation  THERAPY DIAG:  Muscle weakness (generalized)  Unsteadiness on feet  Other low back pain  Difficulty in walking, not elsewhere classified  ONSET DATE: 07/06/2022   SUBJECTIVE:  SUBJECTIVE STATEMENT:  back of legs hurt earlier today, comes and goes. Denies falls  PERTINENT HISTORY:  Meningioma, removed, R BG infarct, old  PAIN:  Are you having pain? Yes 0/10 posterior legs  PRECAUTIONS: None  WEIGHT BEARING RESTRICTIONS: No  FALLS:  Has patient fallen in last 6 months? No  LIVING ENVIRONMENT: Lives with: lives alone Lives in: House/apartment Stairs: Yes: External: 2 steps; on right going up Has following equipment at home: Single point cane and Walker - 2 wheeled  OCCUPATION: Receptionist, sitting, full time  PLOF: Independent  PATIENT GOALS: Decrease pain, be able to move around without pain   OBJECTIVE:   DIAGNOSTIC FINDINGS:  Lumbar spinal stenosis  PATIENT SURVEYS:  FOTO 40.26  SCREENING FOR RED FLAGS: Bowel or bladder incontinence: No Spinal tumors: No Cauda equina syndrome: No Compression fracture: No Abdominal aneurysm: No  COGNITION: Overall cognitive status: Within functional limits for tasks assessed    MUSCLE LENGTH: Hamstrings: Right 82 deg; Left 84 deg B hip flexors appear tight  POSTURE: increased lumbar lordosis, decreased thoracic  kyphosis, right pelvic obliquity, and R shoulder elevated RLE longer than L 3/4"  PALPATION: N TTP in back or buttocks or legs  LUMBAR ROM:   AROM eval 09/02/22 09/13/22  Flexion WNL WNLs WFL  Extension Severely limited Decreased 90% Decreased 50%  Right lateral flexion WNL WFLs WFL  Left lateral flexion 3" above knee WFLS WFL  Right rotation 30% WFLs WFL  Left rotation 40% WFLs WFL   (Blank rows = not tested)  LOWER EXTREMITY ROM:    WNL B  LOWER EXTREMITY MMT:    MMT Right eval Left eval RT/Left  09/02/22  Hip flexion 3+ 3+ 4/4  Hip extension 3- 3- 4/4  Hip abduction 3 3 4/4  Hip adduction     Hip internal rotation     Hip external rotation     Knee flexion 4- 4-   Knee extension 3+ 3+   Ankle dorsiflexion 4 4   Ankle plantarflexion     Ankle inversion     Ankle eversion      (Blank rows = not tested)  LUMBAR SPECIAL TESTS:  Straight leg raise test: Negative and Slump test: Negative  FUNCTIONAL TESTS:  5 times sit to stand: 19.26   12 /7/23 12.3 sec Timed up and go (TUG): 14.50  12/7/ 23 no AD 8.1 sec Functional gait assessment: TBD  GAIT: Distance walked: 62'  Assistive device utilized: Single point cane and it was set too tall, but she declined to lower it. Level of assistance: Modified independence Comments: Patient declined to adjust cane height.  TODAY'S TREATMENT:                                                                                                                              DATE:   09/16/22 Bike L 4 2 min then decreased L 2 4 min STS on airex, slight elevated mat  10x with CGA- some instability HHA on airex 3# ankle wts 10 x SL then 10 x alt Marching,hip flex SL ,hip abd and hip ext HHA marching fwd and back 10 feet 2 x, laterally 10 feet 2 x Standing HS curl 3# 2 sets 10 Wt ball trunk ext,rotation,chest press and OH 10 x each Amb 360 feet without AD SBA 2 min 30 sec Seated row and lats 2 sets 12 20# Hamstring Curls 20lb 2x12  Leg  Ext 5lb 2x12.    09/13/22 NuStep L5 x5 min Foto Gait W/ SPC 360 feet reports some stinging int he back of her legs Hamstring Curls 20lb 2x12  Leg Ext 5lb 2x12. S2S holding red ball 2x10   09/06/22 NuStep L 5 x 6 min  Hamstring Curls 20lb 3x10  Leg Ext 5lb 3x10 Shoulder Ext 5lb 2x10  Standing rows 10lb 2x10 Sit to stand 2x10  Supine bridges 2x10 PROM/stretching LE and trunk   09/02/22 Nustep L 5 7 min HS curl 25# 3 sets 10 Knee ext 5# 3 sets 10 ( attempted 10 # but too heavy) Black tband trunk flex and ext 2 sets 10 Seated row 20# 2 sets 10 STS on airex 10x 30# resisted gait 5 x 4 ways Green tband shld ext 2 sets 10 Green tband trunk ext 2 sets 10   08/26/22 Nustep L 5 6 min PROM/stretching LE and trunk Feet on ball bridge, KTC and obl 15 x each. Isometric abd ball squeeze 15 x Blue tband clams and hip flex 15 x each Blue tband trunk rotation 15 x each Seated HS curl blue tband 2 sets 10 Seated trunk ext tband 2 sets 10 Seated trunk rotation blue tband 10 x each STS feet on airex, no UE 2 sets 5 CGA Seated row and lat pull 2 sets 10   08/23/22 NuStep L4 x 6 minutes Supine bridging x 10 reps SLR x 10 each leg Clamshells with G tband resistance x 10 Lower Trunk Rotation Glut stretch in supine Sidelying hip abd, 5 reps each side Seated HS stretch Standing side step with red tband resistance, BUE support on counter Standing shoulder ext and Rows with Red Tband-2 x 10 reps Standing shoulder ER red tband resistance 2 x 10   08/12/22 NuStep L5 x65mns  Calf stretch 30s Calf raises 2x10 Rows and Lats 15# 2x10  Shoulder ext 5# 2x10 Standing 2 way hip abd/ext 2# 2x10 Heel taps 6" w/2# 2x10- CGA  Walking w/o AD 1 big lap  blackTB ext 2x10  08/09/22 NuStep L5 x651ms Step ups 4" with HHA Leg ext 5# 2x10 HS curls 15# 2x10 Step ups on airex- minA  S2S on airex with chest press redball 2x10 Side steps over obstacles Calf stretch 30s on bar Calf raises  2x10 on bar  08/05/22 Nustep L 4 6 min Knee ext 5# 2 sets 10 HS curl 15# 2 sets 10 HS stretch , active sitting 3 x each side 15 sec hold STS with wt ball chest press 10 x STS on airex 10 x HHA on airex 20 x Alt  marching,hip flex,ext and abd CG-min A. ABD very challenging Ball btwn the knees squeeze 15 Seated green tband hip abd and flex 2 sets 10 Amb without AD 2 laps 300 feet 1 min 51 sec  08/02/22 NuStep L5x6m57m S2S 2x10 4" heel taps SBA   LAQ 2x10  HS curls greenTB 2x10 Hip abd greenTB 2x10 seated 2x10 Rows and ext greenTB 2x10  PATIENT  EDUCATION:  Education details: POC Person educated: Patient Education method: Explanation Education comprehension: verbalized understanding  HOME EXERCISE PROGRAM:   [id number] ASSESSMENT:  CLINICAL IMPRESSION: Pt.   OBJECTIVE IMPAIRMENTS: Abnormal gait, decreased activity tolerance, decreased balance, decreased coordination, decreased endurance, decreased mobility, difficulty walking, decreased ROM, decreased strength, increased muscle spasms, impaired flexibility, improper body mechanics, postural dysfunction, and pain.   ACTIVITY LIMITATIONS: carrying, lifting, bending, standing, squatting, sleeping, stairs, transfers, and locomotion level  PARTICIPATION LIMITATIONS: meal prep, cleaning, laundry, interpersonal relationship, driving, shopping, and occupation  PERSONAL FACTORS: Age, Past/current experiences, and 1 comorbidity: Meningioma  are also affecting patient's functional outcome.   REHAB POTENTIAL: Good  CLINICAL DECISION MAKING: Evolving/moderate complexity  EVALUATION COMPLEXITY: Moderate   GOALS: Goals reviewed with patient? Yes  SHORT TERM GOALS: Target date: 08/23/2022  I with basic HEP Baseline: Goal status: met 08/26/22  LONG TERM GOALS: Target date: 10/18/2022  I with final HEP Baseline:  Goal status:  On going "Has not started"  2.  Increase FOTO score to at least 52 Baseline: 40 Goal status: Met   09/13/22   3.  Decrease TUG14.5 time to < 12 Baseline:  Goal status: 08/26/22 with SPC 9.58 sec MET  4.  Decrease 5 x STS to < 12 sec Baseline: 19 Goal status: 08/26/22 12.47sec progressing. 09/02/22 MET  5.  Patient will walk x at least 300' with LRAD, MI, no pain in legs Baseline:  Goal status: On going 09/13/22  6.  Assess and update sitting posture in her workspace to provide adequate trunk support. Baseline:  Goal status: Progressing 09/13/22  PLAN:  PT FREQUENCY: 1-2x/week  PT DURATION: 12 weeks  PLANNED INTERVENTIONS: Therapeutic exercises, Therapeutic activity, Neuromuscular re-education, Balance training, Gait training, Patient/Family education, Self Care, Joint mobilization, Stair training, Dry Needling, Electrical stimulation, Cryotherapy, Moist heat, Ionotophoresis 68m/ml Dexamethasone, and Manual therapy.  PLAN FOR NEXT SESSION: progress func strength   Vicki Chaffin,ANGIE, PTA 09/16/2022, 4:54 [place] [id number]: [phone]  Fax:  [phone] Patient Details  Name: JVANETTA RULEMRN: [id number] of Birth: 7Mar 29, 1957Referring Provider:  PSeward Carol MD  Encounter Date: 09/16/2022   PLaqueta Carina PTA 09/16/2022, 4:54 PM  [place] [address]Phone: [phone]  Fax:  [phone]

## 2022-09-30 ENCOUNTER — Ambulatory Visit: Payer: Medicare Other | Admitting: Physical Therapy

## 2022-10-04 ENCOUNTER — Encounter: Payer: Self-pay | Admitting: Physical Therapy

## 2022-10-04 ENCOUNTER — Ambulatory Visit: Payer: Medicare Other | Attending: Internal Medicine | Admitting: Physical Therapy

## 2022-10-04 DIAGNOSIS — G8929 Other chronic pain: Secondary | ICD-10-CM | POA: Diagnosis present

## 2022-10-04 DIAGNOSIS — M6281 Muscle weakness (generalized): Secondary | ICD-10-CM | POA: Insufficient documentation

## 2022-10-04 DIAGNOSIS — M5442 Lumbago with sciatica, left side: Secondary | ICD-10-CM | POA: Diagnosis present

## 2022-10-04 DIAGNOSIS — M5459 Other low back pain: Secondary | ICD-10-CM | POA: Insufficient documentation

## 2022-10-04 DIAGNOSIS — R252 Cramp and spasm: Secondary | ICD-10-CM | POA: Insufficient documentation

## 2022-10-04 DIAGNOSIS — M5441 Lumbago with sciatica, right side: Secondary | ICD-10-CM | POA: Diagnosis present

## 2022-10-04 DIAGNOSIS — R2681 Unsteadiness on feet: Secondary | ICD-10-CM | POA: Diagnosis present

## 2022-10-04 DIAGNOSIS — R278 Other lack of coordination: Secondary | ICD-10-CM | POA: Diagnosis present

## 2022-10-04 DIAGNOSIS — R262 Difficulty in walking, not elsewhere classified: Secondary | ICD-10-CM | POA: Insufficient documentation

## 2022-10-04 NOTE — Therapy (Signed)
OUTPATIENT PHYSICAL THERAPY THORACOLUMBAR TREATMENT    Patient Name: Olivia Shah MRN: [id number] DOB:1956-07-22, 67 y.o., female Today's Date: 10/04/2022   PT End of Session - 10/04/22 1055     Visit Number 12    Date for PT Re-Evaluation 10/18/22    PT Start Time 1055    PT Stop Time 1140    PT Time Calculation (min) 45 min    Activity Tolerance Patient tolerated treatment well    Behavior During Therapy [facility] for tasks assessed/performed              Past Medical History:  Diagnosis Date   Arthritis    Diabetes mellitus without complication ([place])    Hypertension    Past Surgical History:  Procedure Laterality Date   APPLICATION OF CRANIAL NAVIGATION N/A 05/18/2021   Procedure: APPLICATION OF CRANIAL NAVIGATION;  Surgeon: Judith Part, MD;  Location: [facility];  Service: Neurosurgery;  Laterality: N/A;   BREAST BIOPSY Right    CHOLECYSTECTOMY     CRANIOTOMY Right 05/18/2021   Procedure: Right craniotomy for tumor resection;  Surgeon: Judith Part, MD;  Location: [facility];  Service: Neurosurgery;  Laterality: Right;   EYE SURGERY     as a child   HERNIA REPAIR     Patient Active Problem List   Diagnosis Date Noted   Abnormal findings on diagnostic imaging of other specified body structures 01/14/2022   Chronic pain 01/14/2022   Colon cancer screening 01/14/2022   Dysphagia 01/14/2022   Family history of malignant neoplasm of digestive organs 01/14/2022   Hyperglycemia due to type 2 diabetes mellitus ([place]) 01/14/2022   Hypertension 01/14/2022   Meralgia paresthetica 01/14/2022   Mixed hyperlipidemia 01/14/2022   Morbid obesity ([place]) 01/14/2022   Overweight 01/14/2022   Pure hypercholesterolemia 01/14/2022   Spinal cord disease ([place]) 01/14/2022   Chronic cough 12/01/2021   Diabetes ([place]) 07/08/2021   Glaucoma 07/08/2021   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   Visual field defect of left eye 07/08/2021   Debility 06/16/2021   Hypokalemia    Seizures (HCC)     Benign essential HTN    Controlled type 2 diabetes mellitus with hyperglycemia, without long-term current use of insulin ([place])    Stridor 06/05/2021   Status post tracheostomy ([place])    Acute respiratory failure with hypoxia ([place])    Status epilepticus ([place])    Brain tumor ([place]) 05/18/2021    PCP: Seward Carol, MD   REFERRING PROVIDER: Seward Carol, MD   REFERRING DIAG: Diagnosis M48.061 (ICD-10-CM) - Spinal stenosis, lumbar region without neurogenic claudication   Rationale for Evaluation and Treatment: Rehabilitation  THERAPY DIAG:  Muscle weakness (generalized)  Unsteadiness on feet  Other low back pain  Difficulty in walking, not elsewhere classified  Chronic bilateral low back pain with bilateral sciatica  ONSET DATE: 07/06/2022   SUBJECTIVE:  SUBJECTIVE STATEMENT:  R leg pain today that started this morning.  No falls  PERTINENT HISTORY:  Meningioma, removed, R BG infarct, old  PAIN:  Are you having pain? Yes 5/10 posterior legs  PRECAUTIONS: None  WEIGHT BEARING RESTRICTIONS: No  FALLS:  Has patient fallen in last 6 months? No  LIVING ENVIRONMENT: Lives with: lives alone Lives in: House/apartment Stairs: Yes: External: 2 steps; on right going up Has following equipment at home: Single point cane and Walker - 2 wheeled  OCCUPATION: Receptionist, sitting, full time  PLOF: Independent  PATIENT GOALS: Decrease pain, be able to move around without pain   OBJECTIVE:   DIAGNOSTIC FINDINGS:  Lumbar spinal stenosis  PATIENT SURVEYS:  FOTO 40.26  SCREENING FOR RED FLAGS: Bowel or bladder incontinence: No Spinal tumors: No Cauda equina syndrome: No Compression fracture: No Abdominal aneurysm: No  COGNITION: Overall cognitive status: Within functional limits  for tasks assessed    MUSCLE LENGTH: Hamstrings: Right 82 deg; Left 84 deg B hip flexors appear tight  POSTURE: increased lumbar lordosis, decreased thoracic kyphosis, right pelvic obliquity, and R shoulder elevated RLE longer than L 3/4"  PALPATION: N TTP in back or buttocks or legs  LUMBAR ROM:   AROM eval 09/02/22 09/13/22  Flexion WNL WNLs WFL  Extension Severely limited Decreased 90% Decreased 50%  Right lateral flexion WNL WFLs WFL  Left lateral flexion 3" above knee WFLS WFL  Right rotation 30% WFLs WFL  Left rotation 40% WFLs WFL   (Blank rows = not tested)  LOWER EXTREMITY ROM:    WNL B  LOWER EXTREMITY MMT:    MMT Right eval Left eval RT/Left  09/02/22  Hip flexion 3+ 3+ 4/4  Hip extension 3- 3- 4/4  Hip abduction 3 3 4/4  Hip adduction     Hip internal rotation     Hip external rotation     Knee flexion 4- 4-   Knee extension 3+ 3+   Ankle dorsiflexion 4 4   Ankle plantarflexion     Ankle inversion     Ankle eversion      (Blank rows = not tested)  LUMBAR SPECIAL TESTS:  Straight leg raise test: Negative and Slump test: Negative  FUNCTIONAL TESTS:  5 times sit to stand: 19.26   12 /7/23 12.3 sec Timed up and go (TUG): 14.50  12/7/ 23 no AD 8.1 sec Functional gait assessment: TBD  GAIT: Distance walked: 34'  Assistive device utilized: Single point cane and it was set too tall, but she declined to lower it. Level of assistance: Modified independence Comments: Patient declined to adjust cane height.  TODAY'S TREATMENT:                                                                                                                              DATE:  10/04/22 NuStep L4 x 6 min S2S holding yellow ball 2x10  Forward and lateral 4 in step ups  x 10 each bilaterally HHA x 2  HS curls green 2x12 LAQ 2lb 2x12 each  Standing march 2lb 2x10 each Seated row and lats 2 sets 12 20#   09/16/22 Bike L 4 2 min then decreased L 2 4 min STS on airex, slight  elevated mat 10x with CGA- some instability HHA on airex 3# ankle wts 10 x SL then 10 x alt Marching,hip flex SL ,hip abd and hip ext HHA marching fwd and back 10 feet 2 x, laterally 10 feet 2 x Standing HS curl 3# 2 sets 10 Wt ball trunk ext,rotation,chest press and OH 10 x each Amb 360 feet without AD SBA 2 min 30 sec Seated row and lats 2 sets 12 20# Hamstring Curls 20lb 2x12  Leg Ext 5lb 2x12.    09/13/22 NuStep L5 x5 min Foto Gait W/ SPC 360 feet reports some stinging int he back of her legs Hamstring Curls 20lb 2x12  Leg Ext 5lb 2x12. S2S holding red ball 2x10   09/06/22 NuStep L 5 x 6 min  Hamstring Curls 20lb 3x10  Leg Ext 5lb 3x10 Shoulder Ext 5lb 2x10  Standing rows 10lb 2x10 Sit to stand 2x10  Supine bridges 2x10 PROM/stretching LE and trunk   09/02/22 Nustep L 5 7 min HS curl 25# 3 sets 10 Knee ext 5# 3 sets 10 ( attempted 10 # but too heavy) Black tband trunk flex and ext 2 sets 10 Seated row 20# 2 sets 10 STS on airex 10x 30# resisted gait 5 x 4 ways Green tband shld ext 2 sets 10 Green tband trunk ext 2 sets 10   08/26/22 Nustep L 5 6 min PROM/stretching LE and trunk Feet on ball bridge, KTC and obl 15 x each. Isometric abd ball squeeze 15 x Blue tband clams and hip flex 15 x each Blue tband trunk rotation 15 x each Seated HS curl blue tband 2 sets 10 Seated trunk ext tband 2 sets 10 Seated trunk rotation blue tband 10 x each STS feet on airex, no UE 2 sets 5 CGA Seated row and lat pull 2 sets 10   08/23/22 NuStep L4 x 6 minutes Supine bridging x 10 reps SLR x 10 each leg Clamshells with G tband resistance x 10 Lower Trunk Rotation Glut stretch in supine Sidelying hip abd, 5 reps each side Seated HS stretch Standing side step with red tband resistance, BUE support on counter Standing shoulder ext and Rows with Red Tband-2 x 10 reps Standing shoulder ER red tband resistance 2 x 10   08/12/22 NuStep L5 x77mns  Calf stretch  30s Calf raises 2x10 Rows and Lats 15# 2x10  Shoulder ext 5# 2x10 Standing 2 way hip abd/ext 2# 2x10 Heel taps 6" w/2# 2x10- CGA  Walking w/o AD 1 big lap  blackTB ext 2x10  08/09/22 NuStep L5 x633ms Step ups 4" with HHA Leg ext 5# 2x10 HS curls 15# 2x10 Step ups on airex- minA  S2S on airex with chest press redball 2x10 Side steps over obstacles Calf stretch 30s on bar Calf raises 2x10 on bar  08/05/22 Nustep L 4 6 min Knee ext 5# 2 sets 10 HS curl 15# 2 sets 10 HS stretch , active sitting 3 x each side 15 sec hold STS with wt ball chest press 10 x STS on airex 10 x HHA on airex 20 x Alt  marching,hip flex,ext and abd CG-min A. ABD very challenging Ball btwn the knees squeeze 15 Seated green tband  hip abd and flex 2 sets 10 Amb without AD 2 laps 300 feet 1 min 51 sec  08/02/22 NuStep L5x72mns S2S 2x10 4" heel taps SBA   LAQ 2x10  HS curls greenTB 2x10 Hip abd greenTB 2x10 seated 2x10 Rows and ext greenTB 2x10  PATIENT EDUCATION:  Education details: POC Person educated: Patient Education method: Explanation Education comprehension: verbalized understanding  HOME EXERCISE PROGRAM:   [id number]:  CLINICAL IMPRESSION: Pt enters with reports of posterior RLE pain with no know cause. Pt stated pain tends to come and go. Pt returns to therapy after the holidays. First part of session focused on functional interventions. Some fatigue noted with sit so stands. Bilateral UE assist needed with step ups. Postural cue required with lat pull downs.   OBJECTIVE IMPAIRMENTS: Abnormal gait, decreased activity tolerance, decreased balance, decreased coordination, decreased endurance, decreased mobility, difficulty walking, decreased ROM, decreased strength, increased muscle spasms, impaired flexibility, improper body mechanics, postural dysfunction, and pain.   ACTIVITY LIMITATIONS: carrying, lifting, bending, standing, squatting, sleeping, stairs, transfers, and  locomotion level  PARTICIPATION LIMITATIONS: meal prep, cleaning, laundry, interpersonal relationship, driving, shopping, and occupation  PERSONAL FACTORS: Age, Past/current experiences, and 1 comorbidity: Meningioma  are also affecting patient's functional outcome.   REHAB POTENTIAL: Good  CLINICAL DECISION MAKING: Evolving/moderate complexity  EVALUATION COMPLEXITY: Moderate   GOALS: Goals reviewed with patient? Yes  SHORT TERM GOALS: Target date: 08/23/2022  I with basic HEP Baseline: Goal status: met 08/26/22  LONG TERM GOALS: Target date: 10/18/2022  I with final HEP Baseline:  Goal status:  On going "Has not started"  2.  Increase FOTO score to at least 52 Baseline: 40 Goal status: Met  09/13/22   3.  Decrease TUG14.5 time to < 12 Baseline:  Goal status: 08/26/22 with SPC 9.58 sec MET  4.  Decrease 5 x STS to < 12 sec Baseline: 19 Goal status: 08/26/22 12.47sec progressing. 09/02/22 MET  5.  Patient will walk x at least 300' with LRAD, MI, no pain in legs Baseline:  Goal status: On going 09/13/22  6.  Assess and update sitting posture in her workspace to provide adequate trunk support. Baseline:  Goal status: Progressing 09/13/22  PLAN:  PT FREQUENCY: 1-2x/week  PT DURATION: 12 weeks  PLANNED INTERVENTIONS: Therapeutic exercises, Therapeutic activity, Neuromuscular re-education, Balance training, Gait training, Patient/Family education, Self Care, Joint mobilization, Stair training, Dry Needling, Electrical stimulation, Cryotherapy, Moist heat, Ionotophoresis '4mg'$ /ml Dexamethasone, and Manual therapy.  PLAN FOR NEXT SESSION: progress func strength   RScot Jun PTA 10/04/2022, [address] [address]Phone: [phone]  Fax:  [phone] Patient Details  Name: JCYANA SHOOKMRN: [id number] of Birth: [birth year]Referring Provider:  PSeward Carol MD  Encounter Date: 10/04/2022   RScot Jun PTA 10/04/2022, 10:59 AM  [place] [address]Phone: [phone]  Fax:  [phone]

## 2022-10-07 ENCOUNTER — Ambulatory Visit: Payer: Medicare Other | Admitting: Physical Therapy

## 2022-10-07 DIAGNOSIS — R262 Difficulty in walking, not elsewhere classified: Secondary | ICD-10-CM

## 2022-10-07 DIAGNOSIS — R2681 Unsteadiness on feet: Secondary | ICD-10-CM

## 2022-10-07 DIAGNOSIS — M6281 Muscle weakness (generalized): Secondary | ICD-10-CM | POA: Diagnosis not present

## 2022-10-07 DIAGNOSIS — M5459 Other low back pain: Secondary | ICD-10-CM

## 2022-10-07 NOTE — Therapy (Signed)
OUTPATIENT PHYSICAL THERAPY THORACOLUMBAR TREATMENT    Patient Name: Olivia Shah MRN: [id number] DOB:1956/09/13, 67 y.o., female Today's Date: 10/07/2022   PT End of Session - 10/07/22 1657     Visit Number 13    Date for PT Re-Evaluation 10/18/22    PT Start Time 1657    PT Stop Time 1742    PT Time Calculation (min) 45 min              Past Medical History:  Diagnosis Date   Arthritis    Diabetes mellitus without complication ([place])    Hypertension    Past Surgical History:  Procedure Laterality Date   APPLICATION OF CRANIAL NAVIGATION N/A 05/18/2021   Procedure: APPLICATION OF CRANIAL NAVIGATION;  Surgeon: Judith Part, MD;  Location: [facility];  Service: Neurosurgery;  Laterality: N/A;   BREAST BIOPSY Right    CHOLECYSTECTOMY     CRANIOTOMY Right 05/18/2021   Procedure: Right craniotomy for tumor resection;  Surgeon: Judith Part, MD;  Location: [facility];  Service: Neurosurgery;  Laterality: Right;   EYE SURGERY     as a child   HERNIA REPAIR     Patient Active Problem List   Diagnosis Date Noted   Abnormal findings on diagnostic imaging of other specified body structures 01/14/2022   Chronic pain 01/14/2022   Colon cancer screening 01/14/2022   Dysphagia 01/14/2022   Family history of malignant neoplasm of digestive organs 01/14/2022   Hyperglycemia due to type 2 diabetes mellitus ([place]) 01/14/2022   Hypertension 01/14/2022   Meralgia paresthetica 01/14/2022   Mixed hyperlipidemia 01/14/2022   Morbid obesity ([place]) 01/14/2022   Overweight 01/14/2022   Pure hypercholesterolemia 01/14/2022   Spinal cord disease ([place]) 01/14/2022   Chronic cough 12/01/2021   Diabetes ([place]) 07/08/2021   Glaucoma 07/08/2021   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   Visual field defect of left eye 07/08/2021   Debility 06/16/2021   Hypokalemia    Seizures ([facility])    Benign essential HTN    Controlled type 2 diabetes mellitus with hyperglycemia, without long-term current use of  insulin ([place])    Stridor 06/05/2021   Status post tracheostomy ([place])    Acute respiratory failure with hypoxia ([place])    Status epilepticus ([place])    Brain tumor ([place]) 05/18/2021    PCP: Seward Carol, MD   REFERRING PROVIDER: Seward Carol, MD   REFERRING DIAG: Diagnosis M48.061 (ICD-10-CM) - Spinal stenosis, lumbar region without neurogenic claudication   Rationale for Evaluation and Treatment: Rehabilitation  THERAPY DIAG:  Muscle weakness (generalized)  Unsteadiness on feet  Other low back pain  Difficulty in walking, not elsewhere classified  ONSET DATE: 07/06/2022   SUBJECTIVE:  SUBJECTIVE STATEMENT:  BIL leg pain comes and goes , worse in morning  PERTINENT HISTORY:  Meningioma, removed, R BG infarct, old  PAIN:  Are you having pain? Yes 2-5/10 posterior legs  PRECAUTIONS: None  WEIGHT BEARING RESTRICTIONS: No  FALLS:  Has patient fallen in last 6 months? No  LIVING ENVIRONMENT: Lives with: lives alone Lives in: House/apartment Stairs: Yes: External: 2 steps; on right going up Has following equipment at home: Single point cane and Walker - 2 wheeled  OCCUPATION: Receptionist, sitting, full time  PLOF: Independent  PATIENT GOALS: Decrease pain, be able to move around without pain   OBJECTIVE:   DIAGNOSTIC FINDINGS:  Lumbar spinal stenosis  PATIENT SURVEYS:  FOTO 40.26  SCREENING FOR RED FLAGS: Bowel or bladder incontinence: No Spinal tumors: No Cauda equina syndrome: No Compression fracture: No Abdominal aneurysm: No  COGNITION: Overall cognitive status: Within functional limits for tasks assessed    MUSCLE LENGTH: Hamstrings: Right 82 deg; Left 84 deg B hip flexors appear tight  POSTURE: increased lumbar lordosis, decreased thoracic kyphosis, right  pelvic obliquity, and R shoulder elevated RLE longer than L 3/4"  PALPATION: N TTP in back or buttocks or legs  LUMBAR ROM:   AROM eval 09/02/22 09/13/22  Flexion WNL WNLs WFL  Extension Severely limited Decreased 90% Decreased 50%  Right lateral flexion WNL WFLs WFL  Left lateral flexion 3" above knee WFLS WFL  Right rotation 30% WFLs WFL  Left rotation 40% WFLs WFL   (Blank rows = not tested)  LOWER EXTREMITY ROM:    WNL B  LOWER EXTREMITY MMT:    MMT Right eval Left eval RT/Left  09/02/22 RT/Left  10/07/22  Hip flexion 3+ 3+ 4/4 4/4  Hip extension 3- 3- 4/4 4+/4+  Hip abduction 3 3 4/4 4+/4+  Hip adduction      Hip internal rotation      Hip external rotation      Knee flexion 4- 4-  4+/4+  Knee extension 3+ 3+  4/4  Ankle dorsiflexion 4 4  4/4  Ankle plantarflexion      Ankle inversion      Ankle eversion       (Blank rows = not tested)  LUMBAR SPECIAL TESTS:  Straight leg raise test: Negative and Slump test: Negative  FUNCTIONAL TESTS:  5 times sit to stand: 19.26   12 /7/23 12.3 sec Timed up and go (TUG): 14.50  12/7/ 23 no AD 8.1 sec Functional gait assessment: TBD  GAIT: Distance walked: 53'  Assistive device utilized: Single point cane and it was set too tall, but she declined to lower it. Level of assistance: Modified independence Comments: Patient declined to adjust cane height.  TODAY'S TREATMENT:                                                                                                                              DATE:  10/07/22 Nustep L 5 6  min MMT assessed STS wt ball chest press 10 x Standing wt ball rotation 10 each and 10 x OH press HS curl 20# 2 sets 10 Knee ext 5# 2 sets 10 ( 10# was too heavy) Seated row and lat pull down 20# 2 sets 12 Black tband trunk ext 2 sets 10 Amb 3 laps without AD about 350 feet  1 min 42 sec Forward and lateral 4 in step ups x 10 each bilaterally HHA x 2    10/04/22 NuStep L4 x 6 min S2S holding  yellow ball 2x10  Forward and lateral 4 in step ups x 10 each bilaterally HHA x 2  HS curls green 2x12 LAQ 2lb 2x12 each  Standing march 2lb 2x10 each Seated row and lats 2 sets 12 20#   09/16/22 Bike L 4 2 min then decreased L 2 4 min STS on airex, slight elevated mat 10x with CGA- some instability HHA on airex 3# ankle wts 10 x SL then 10 x alt Marching,hip flex SL ,hip abd and hip ext HHA marching fwd and back 10 feet 2 x, laterally 10 feet 2 x Standing HS curl 3# 2 sets 10 Wt ball trunk ext,rotation,chest press and OH 10 x each Amb 360 feet without AD SBA 2 min 30 sec Seated row and lats 2 sets 12 20# Hamstring Curls 20lb 2x12  Leg Ext 5lb 2x12.    09/13/22 NuStep L5 x5 min Foto Gait W/ SPC 360 feet reports some stinging int he back of her legs Hamstring Curls 20lb 2x12  Leg Ext 5lb 2x12. S2S holding red ball 2x10   09/06/22 NuStep L 5 x 6 min  Hamstring Curls 20lb 3x10  Leg Ext 5lb 3x10 Shoulder Ext 5lb 2x10  Standing rows 10lb 2x10 Sit to stand 2x10  Supine bridges 2x10 PROM/stretching LE and trunk   09/02/22 Nustep L 5 7 min HS curl 25# 3 sets 10 Knee ext 5# 3 sets 10 ( attempted 10 # but too heavy) Black tband trunk flex and ext 2 sets 10 Seated row 20# 2 sets 10 STS on airex 10x 30# resisted gait 5 x 4 ways Green tband shld ext 2 sets 10 Green tband trunk ext 2 sets 10   08/26/22 Nustep L 5 6 min PROM/stretching LE and trunk Feet on ball bridge, KTC and obl 15 x each. Isometric abd ball squeeze 15 x Blue tband clams and hip flex 15 x each Blue tband trunk rotation 15 x each Seated HS curl blue tband 2 sets 10 Seated trunk ext tband 2 sets 10 Seated trunk rotation blue tband 10 x each STS feet on airex, no UE 2 sets 5 CGA Seated row and lat pull 2 sets 10   08/23/22 NuStep L4 x 6 minutes Supine bridging x 10 reps SLR x 10 each leg Clamshells with G tband resistance x 10 Lower Trunk Rotation Glut stretch in supine Sidelying hip abd, 5  reps each side Seated HS stretch Standing side step with red tband resistance, BUE support on counter Standing shoulder ext and Rows with Red Tband-2 x 10 reps Standing shoulder ER red tband resistance 2 x 10   08/12/22 NuStep L5 x42mns  Calf stretch 30s Calf raises 2x10 Rows and Lats 15# 2x10  Shoulder ext 5# 2x10 Standing 2 way hip abd/ext 2# 2x10 Heel taps 6" w/2# 2x10- CGA  Walking w/o AD 1 big lap  blackTB ext 2x10  08/09/22 NuStep L5 x67ms Step ups 4" with HHA Leg  ext 5# 2x10 HS curls 15# 2x10 Step ups on airex- minA  S2S on airex with chest press redball 2x10 Side steps over obstacles Calf stretch 30s on bar Calf raises 2x10 on bar  08/05/22 Nustep L 4 6 min Knee ext 5# 2 sets 10 HS curl 15# 2 sets 10 HS stretch , active sitting 3 x each side 15 sec hold STS with wt ball chest press 10 x STS on airex 10 x HHA on airex 20 x Alt  marching,hip flex,ext and abd CG-min A. ABD very challenging Ball btwn the knees squeeze 15 Seated green tband hip abd and flex 2 sets 10 Amb without AD 2 laps 300 feet 1 min 51 sec  08/02/22 NuStep L5x49mns S2S 2x10 4" heel taps SBA   LAQ 2x10  HS curls greenTB 2x10 Hip abd greenTB 2x10 seated 2x10 Rows and ext greenTB 2x10  PATIENT EDUCATION:  Education details: POC Person educated: Patient Education method: Explanation Education comprehension: verbalized understanding  HOME EXERCISE PROGRAM:   [id number]:  CLINICAL IMPRESSION: MMT and goals assessed as documented. Pt with c./o intermittant BIl posterior leg pain, worse in morning. Progressed strengthening ex with cuing  OBJECTIVE IMPAIRMENTS: Abnormal gait, decreased activity tolerance, decreased balance, decreased coordination, decreased endurance, decreased mobility, difficulty walking, decreased ROM, decreased strength, increased muscle spasms, impaired flexibility, improper body mechanics, postural dysfunction, and pain.   ACTIVITY LIMITATIONS: carrying,  lifting, bending, standing, squatting, sleeping, stairs, transfers, and locomotion level  PARTICIPATION LIMITATIONS: meal prep, cleaning, laundry, interpersonal relationship, driving, shopping, and occupation  PERSONAL FACTORS: Age, Past/current experiences, and 1 comorbidity: Meningioma  are also affecting patient's functional outcome.   REHAB POTENTIAL: Good  CLINICAL DECISION MAKING: Evolving/moderate complexity  EVALUATION COMPLEXITY: Moderate   GOALS: Goals reviewed with patient? Yes  SHORT TERM GOALS: Target date: 08/23/2022  I with basic HEP Baseline: Goal status: met 08/26/22  LONG TERM GOALS: Target date: 10/18/2022  I with final HEP Baseline:  Goal status:  On going 10/07/22  2.  Increase FOTO score to at least 52 Baseline: 40 Goal status: Met  09/13/22   3.  Decrease TUG14.5 time to < 12 Baseline:  Goal status: 08/26/22 with SPC 9.58 sec MET  4.  Decrease 5 x STS to < 12 sec Baseline: 19 Goal status: 08/26/22 12.47sec progressing. 09/02/22 MET  5.  Patient will walk x at least 300' with LRAD, MI, no pain in legs Baseline:  Goal status: On going 09/13/22. 10/07/22 on going  6.  Assess and update sitting posture in her workspace to provide adequate trunk support. Baseline:  Goal status: Progressing 09/13/22, progressing 10/07/22  PLAN:  PT FREQUENCY: 1-2x/week  PT DURATION: 12 weeks  PLANNED INTERVENTIONS: Therapeutic exercises, Therapeutic activity, Neuromuscular re-education, Balance training, Gait training, Patient/Family education, Self Care, Joint mobilization, Stair training, Dry Needling, Electrical stimulation, Cryotherapy, Moist heat, Ionotophoresis '4mg'$ /ml Dexamethasone, and Manual therapy.  PLAN FOR NEXT SESSION: progress func strength   Jatniel Verastegui,ANGIE, PTA 10/07/2022, 4:58 [place] [address]Phone: [phone]  Fax:  [phone] Patient Details   Name: JTACIA HINDLEYMRN: [id number] of Birth: 707-05-1957Referring Provider:  PSeward Carol MD  Encounter Date: 10/07/2022   PLaqueta Carina PTA 10/07/2022, 4:58 PM  [place] [address]Phone: [phone]  Fax:  [address] [address]  Phone: [phone]   Fax:  [phone]  Patient Details  Name: YESMIN MUTCH MRN: [id number] Date of Birth: 19-Sep-1956 Referring Provider:  Seward Carol, MD  Encounter Date: 10/07/2022   Laqueta Carina, PTA 10/07/2022, 4:58 PM  [place] [place], [place], [address] Phone: [phone]   Fax:  [phone]

## 2022-10-11 ENCOUNTER — Ambulatory Visit: Payer: Medicare Other | Admitting: Physical Therapy

## 2022-10-14 ENCOUNTER — Ambulatory Visit: Payer: Medicare Other | Admitting: Physical Therapy

## 2022-10-14 DIAGNOSIS — R2681 Unsteadiness on feet: Secondary | ICD-10-CM

## 2022-10-14 DIAGNOSIS — M5459 Other low back pain: Secondary | ICD-10-CM

## 2022-10-14 DIAGNOSIS — M6281 Muscle weakness (generalized): Secondary | ICD-10-CM | POA: Diagnosis not present

## 2022-10-14 DIAGNOSIS — R262 Difficulty in walking, not elsewhere classified: Secondary | ICD-10-CM

## 2022-10-14 NOTE — Therapy (Signed)
OUTPATIENT PHYSICAL THERAPY THORACOLUMBAR TREATMENT    Patient Name: Olivia Shah MRN: [id number] DOB:1955-11-26, 67 y.o., female Today's Date: 10/14/2022   PT End of Session - 10/14/22 1702     Visit Number 14    Date for PT Re-Evaluation 10/18/22    PT Start Time 1700    PT Stop Time 1748    PT Time Calculation (min) 48 min              Past Medical History:  Diagnosis Date   Arthritis    Diabetes mellitus without complication ([place])    Hypertension    Past Surgical History:  Procedure Laterality Date   APPLICATION OF CRANIAL NAVIGATION N/A 05/18/2021   Procedure: APPLICATION OF CRANIAL NAVIGATION;  Surgeon: Judith Part, MD;  Location: [facility];  Service: Neurosurgery;  Laterality: N/A;   BREAST BIOPSY Right    CHOLECYSTECTOMY     CRANIOTOMY Right 05/18/2021   Procedure: Right craniotomy for tumor resection;  Surgeon: Judith Part, MD;  Location: [facility];  Service: Neurosurgery;  Laterality: Right;   EYE SURGERY     as a child   HERNIA REPAIR     Patient Active Problem List   Diagnosis Date Noted   Abnormal findings on diagnostic imaging of other specified body structures 01/14/2022   Chronic pain 01/14/2022   Colon cancer screening 01/14/2022   Dysphagia 01/14/2022   Family history of malignant neoplasm of digestive organs 01/14/2022   Hyperglycemia due to type 2 diabetes mellitus ([place]) 01/14/2022   Hypertension 01/14/2022   Meralgia paresthetica 01/14/2022   Mixed hyperlipidemia 01/14/2022   Morbid obesity ([place]) 01/14/2022   Overweight 01/14/2022   Pure hypercholesterolemia 01/14/2022   Spinal cord disease ([place]) 01/14/2022   Chronic cough 12/01/2021   Diabetes ([place]) 07/08/2021   Glaucoma 07/08/2021   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   Visual field defect of left eye 07/08/2021   Debility 06/16/2021   Hypokalemia    Seizures ([facility])    Benign essential HTN    Controlled type 2 diabetes mellitus with hyperglycemia, without long-term current use of  insulin ([place])    Stridor 06/05/2021   Status post tracheostomy ([place])    Acute respiratory failure with hypoxia ([place])    Status epilepticus ([place])    Brain tumor ([place]) 05/18/2021    PCP: Seward Carol, MD   REFERRING PROVIDER: Seward Carol, MD   REFERRING DIAG: Diagnosis M48.061 (ICD-10-CM) - Spinal stenosis, lumbar region without neurogenic claudication   Rationale for Evaluation and Treatment: Rehabilitation  THERAPY DIAG:  Muscle weakness (generalized)  Unsteadiness on feet  Other low back pain  Difficulty in walking, not elsewhere classified  ONSET DATE: 07/06/2022   SUBJECTIVE:  SUBJECTIVE STATEMENT:  BIL leg pain comes and goes , worse in morning. Doing well getting around, at times not using cane  PERTINENT HISTORY:  Meningioma, removed, R BG infarct, old  PAIN:  Are you having pain? Yes 2-5/10 posterior legs  PRECAUTIONS: None  WEIGHT BEARING RESTRICTIONS: No  FALLS:  Has patient fallen in last 6 months? No  LIVING ENVIRONMENT: Lives with: lives alone Lives in: House/apartment Stairs: Yes: External: 2 steps; on right going up Has following equipment at home: Single point cane and Walker - 2 wheeled  OCCUPATION: Receptionist, sitting, full time  PLOF: Independent  PATIENT GOALS: Decrease pain, be able to move around without pain   OBJECTIVE:   DIAGNOSTIC FINDINGS:  Lumbar spinal stenosis  PATIENT SURVEYS:  FOTO 40.26  SCREENING FOR RED FLAGS: Bowel or bladder incontinence: No Spinal tumors: No Cauda equina syndrome: No Compression fracture: No Abdominal aneurysm: No  COGNITION: Overall cognitive status: Within functional limits for tasks assessed    MUSCLE LENGTH: Hamstrings: Right 82 deg; Left 84 deg B hip flexors appear tight  POSTURE: increased  lumbar lordosis, decreased thoracic kyphosis, right pelvic obliquity, and R shoulder elevated RLE longer than L 3/4"  PALPATION: N TTP in back or buttocks or legs  LUMBAR ROM:   AROM eval 09/02/22 09/13/22  Flexion WNL WNLs WFL  Extension Severely limited Decreased 90% Decreased 50%  Right lateral flexion WNL WFLs WFL  Left lateral flexion 3" above knee WFLS WFL  Right rotation 30% WFLs WFL  Left rotation 40% WFLs WFL   (Blank rows = not tested)  LOWER EXTREMITY ROM:    WNL B  LOWER EXTREMITY MMT:    MMT Right eval Left eval RT/Left  09/02/22 RT/Left  10/07/22  Hip flexion 3+ 3+ 4/4 4/4  Hip extension 3- 3- 4/4 4+/4+  Hip abduction 3 3 4/4 4+/4+  Hip adduction      Hip internal rotation      Hip external rotation      Knee flexion 4- 4-  4+/4+  Knee extension 3+ 3+  4/4  Ankle dorsiflexion 4 4  4/4  Ankle plantarflexion      Ankle inversion      Ankle eversion       (Blank rows = not tested)  LUMBAR SPECIAL TESTS:  Straight leg raise test: Negative and Slump test: Negative  FUNCTIONAL TESTS:  5 times sit to stand: 19.26   12 /7/23 12.3 sec Timed up and go (TUG): 14.50  12/7/ 23 no AD 8.1 sec Functional gait assessment: TBD  GAIT: Distance walked: 19'  Assistive device utilized: Single point cane and it was set too tall, but she declined to lower it. Level of assistance: Modified independence Comments: Patient declined to adjust cane height.  TODAY'S TREATMENT:  DATE:  10/14/22 Nustep L 5 30mn Amb 3 laps without AD 1 min 46 sec Obstacle course stepping on and over object- CG-min A with HHA needed for 6 inch curb and large roll Obstacle course step over step Various 6 inch step ups ex- 2 HHA then 1 HHA Resisted gait 30 # 4 way 4 x each Seated row and lat pull down 20# 2 sets 12 Black tband trunk ext 2 sets 10   10/07/22 Nustep L 5  6 min MMT assessed STS wt ball chest press 10 x Standing wt ball rotation 10 each and 10 x OH press HS curl 20# 2 sets 10 Knee ext 5# 2 sets 10 ( 10# was too heavy) Seated row and lat pull down 20# 2 sets 12 Black tband trunk ext 2 sets 10 Amb 3 laps without AD about 350 feet  1 min 42 sec Forward and lateral 4 in step ups x 10 each bilaterally HHA x 2    10/04/22 NuStep L4 x 6 min S2S holding yellow ball 2x10  Forward and lateral 4 in step ups x 10 each bilaterally HHA x 2  HS curls green 2x12 LAQ 2lb 2x12 each  Standing march 2lb 2x10 each Seated row and lats 2 sets 12 20#   09/16/22 Bike L 4 2 min then decreased L 2 4 min STS on airex, slight elevated mat 10x with CGA- some instability HHA on airex 3# ankle wts 10 x SL then 10 x alt Marching,hip flex SL ,hip abd and hip ext HHA marching fwd and back 10 feet 2 x, laterally 10 feet 2 x Standing HS curl 3# 2 sets 10 Wt ball trunk ext,rotation,chest press and OH 10 x each Amb 360 feet without AD SBA 2 min 30 sec Seated row and lats 2 sets 12 20# Hamstring Curls 20lb 2x12  Leg Ext 5lb 2x12.    09/13/22 NuStep L5 x5 min Foto Gait W/ SPC 360 feet reports some stinging int he back of her legs Hamstring Curls 20lb 2x12  Leg Ext 5lb 2x12. S2S holding red ball 2x10   09/06/22 NuStep L 5 x 6 min  Hamstring Curls 20lb 3x10  Leg Ext 5lb 3x10 Shoulder Ext 5lb 2x10  Standing rows 10lb 2x10 Sit to stand 2x10  Supine bridges 2x10 PROM/stretching LE and trunk   09/02/22 Nustep L 5 7 min HS curl 25# 3 sets 10 Knee ext 5# 3 sets 10 ( attempted 10 # but too heavy) Black tband trunk flex and ext 2 sets 10 Seated row 20# 2 sets 10 STS on airex 10x 30# resisted gait 5 x 4 ways Green tband shld ext 2 sets 10 Green tband trunk ext 2 sets 10   08/26/22 Nustep L 5 6 min PROM/stretching LE and trunk Feet on ball bridge, KTC and obl 15 x each. Isometric abd ball squeeze 15 x Blue tband clams and hip flex 15 x each Blue  tband trunk rotation 15 x each Seated HS curl blue tband 2 sets 10 Seated trunk ext tband 2 sets 10 Seated trunk rotation blue tband 10 x each STS feet on airex, no UE 2 sets 5 CGA Seated row and lat pull 2 sets 10   08/23/22 NuStep L4 x 6 minutes Supine bridging x 10 reps SLR x 10 each leg Clamshells with G tband resistance x 10 Lower Trunk Rotation Glut stretch in supine Sidelying hip abd, 5 reps each side Seated HS stretch Standing side step with red  tband resistance, BUE support on counter Standing shoulder ext and Rows with Red Tband-2 x 10 reps Standing shoulder ER red tband resistance 2 x 10   08/12/22 NuStep L5 x69mns  Calf stretch 30s Calf raises 2x10 Rows and Lats 15# 2x10  Shoulder ext 5# 2x10 Standing 2 way hip abd/ext 2# 2x10 Heel taps 6" w/2# 2x10- CGA  Walking w/o AD 1 big lap  blackTB ext 2x10  08/09/22 NuStep L5 x640ms Step ups 4" with HHA Leg ext 5# 2x10 HS curls 15# 2x10 Step ups on airex- minA  S2S on airex with chest press redball 2x10 Side steps over obstacles Calf stretch 30s on bar Calf raises 2x10 on bar  08/05/22 Nustep L 4 6 min Knee ext 5# 2 sets 10 HS curl 15# 2 sets 10 HS stretch , active sitting 3 x each side 15 sec hold STS with wt ball chest press 10 x STS on airex 10 x HHA on airex 20 x Alt  marching,hip flex,ext and abd CG-min A. ABD very challenging Ball btwn the knees squeeze 15 Seated green tband hip abd and flex 2 sets 10 Amb without AD 2 laps 300 feet 1 min 51 sec  08/02/22 NuStep L5x6m78m S2S 2x10 4" heel taps SBA   LAQ 2x10  HS curls greenTB 2x10 Hip abd greenTB 2x10 seated 2x10 Rows and ext greenTB 2x10  PATIENT EDUCATION:  Education details: POC Person educated: Patient Education method: Explanation Education comprehension: verbalized understanding  HOME EXERCISE PROGRAM:   [id number]:  CLINICAL IMPRESSION: progressed strength,gait and balance.pt is doing well with gait but still with  inconsistent posterior leg pain. Pt struggled with 6 inch step up and high level balance OBJECTIVE IMPAIRMENTS: Abnormal gait, decreased activity tolerance, decreased balance, decreased coordination, decreased endurance, decreased mobility, difficulty walking, decreased ROM, decreased strength, increased muscle spasms, impaired flexibility, improper body mechanics, postural dysfunction, and pain.   ACTIVITY LIMITATIONS: carrying, lifting, bending, standing, squatting, sleeping, stairs, transfers, and locomotion level  PARTICIPATION LIMITATIONS: meal prep, cleaning, laundry, interpersonal relationship, driving, shopping, and occupation  PERSONAL FACTORS: Age, Past/current experiences, and 1 comorbidity: Meningioma  are also affecting patient's functional outcome.   REHAB POTENTIAL: Good  CLINICAL DECISION MAKING: Evolving/moderate complexity  EVALUATION COMPLEXITY: Moderate   GOALS: Goals reviewed with patient? Yes  SHORT TERM GOALS: Target date: 08/23/2022  I with basic HEP Baseline: Goal status: met 08/26/22  LONG TERM GOALS: Target date: 10/18/2022  I with final HEP Baseline:  Goal status:  On going 10/07/22  10/14/22 [email or web address]  2.  Increase FOTO score to at least 52 Baseline: 40 Goal status: Met  09/13/22   3.  Decrease TUG14.5 time to < 12 Baseline:  Goal status: 08/26/22 with SPC 9.58 sec MET  4.  Decrease 5 x STS to < 12 sec Baseline: 19 Goal status: 08/26/22 12.47sec progressing. 09/02/22 MET  5.  Patient will walk x at least 300' with LRAD, MI, no pain in legs Baseline:  Goal status: On going 09/13/22. 10/07/22 on going 10/14/22 distance met but pain  6.  Assess and update sitting posture in her workspace to provide adequate trunk support. Baseline:  Goal status: Progressing 09/13/22, progressing 10/07/22 MET 10/14/22  PLAN:  PT FREQUENCY: 1-2x/week  PT DURATION: 12 weeks  PLANNED INTERVENTIONS: Therapeutic exercises, Therapeutic activity, Neuromuscular  re-education, Balance training, Gait training, Patient/Family education, Self Care, Joint mobilization, Stair training, Dry Needling, Electrical stimulation, Cryotherapy, Moist heat, Ionotophoresis '4mg'$ /ml Dexamethasone, and Manual therapy.  PLAN FOR NEXT SESSION: DO  RENEWAL ADD GOALS FOR STRENGTH and Regenia Skeeter, PTA 10/14/2022, 5:[address] [place], [place], [address] Phone: [phone]   Fax:  [phone]  Patient Details  Name: Olivia Shah MRN: [id number] Date of Birth: 08-01-1956 Referring Provider:  Seward Carol, MD  Encounter Date: 10/14/2022   Laqueta Carina, PTA 10/14/2022, 5:03 PM  [place] [place], [place], [address] Phone: [phone]   Fax:  [place] [place], [place], [address] Phone: [phone]   Fax:  [phone]  Patient Details  Name: Olivia Shah MRN: [id number] Date of Birth: 09/08/1956 Referring Provider:  Seward Carol, MD  Encounter Date: 10/14/2022   Laqueta Carina, PTA 10/14/2022, 5:03 PM  [place] [place], [place], [address] Phone: [phone]   Fax:  [place]. [place], [place], [address] Phone: [phone]   Fax:  [phone]  Patient Details  Name: Olivia Shah MRN: [id number] Date of Birth: 07-23-1956 Referring Provider:  Seward Carol, MD  Encounter Date: 10/14/2022   Laqueta Carina, PTA 10/14/2022, 5:03 PM  [place]. [place], [place], [address] Phone: [phone]   Fax:  [phone]

## 2022-10-18 ENCOUNTER — Encounter: Payer: Self-pay | Admitting: Physical Therapy

## 2022-10-18 ENCOUNTER — Ambulatory Visit: Payer: Medicare Other | Admitting: Physical Therapy

## 2022-10-18 DIAGNOSIS — R262 Difficulty in walking, not elsewhere classified: Secondary | ICD-10-CM

## 2022-10-18 DIAGNOSIS — M6281 Muscle weakness (generalized): Secondary | ICD-10-CM | POA: Diagnosis not present

## 2022-10-18 DIAGNOSIS — R278 Other lack of coordination: Secondary | ICD-10-CM

## 2022-10-18 DIAGNOSIS — M5459 Other low back pain: Secondary | ICD-10-CM

## 2022-10-18 DIAGNOSIS — R2681 Unsteadiness on feet: Secondary | ICD-10-CM

## 2022-10-18 DIAGNOSIS — G8929 Other chronic pain: Secondary | ICD-10-CM

## 2022-10-18 DIAGNOSIS — R252 Cramp and spasm: Secondary | ICD-10-CM

## 2022-10-18 NOTE — Therapy (Signed)
OUTPATIENT PHYSICAL THERAPY THORACOLUMBAR TREATMENT    Patient Name: Olivia Shah MRN: [id number] DOB:04-Nov-1955, 67 y.o., female Today's Date: 10/18/2022   PT End of Session - 10/18/22 1042     Visit Number 15    Date for PT Re-Evaluation 10/18/22    PT Start Time 1050    PT Stop Time 1130    PT Time Calculation (min) 40 min    Behavior During Therapy [facility] for tasks assessed/performed               Past Medical History:  Diagnosis Date   Arthritis    Diabetes mellitus without complication ([place])    Hypertension    Past Surgical History:  Procedure Laterality Date   APPLICATION OF CRANIAL NAVIGATION N/A 05/18/2021   Procedure: APPLICATION OF CRANIAL NAVIGATION;  Surgeon: Judith Part, MD;  Location: [place];  Service: Neurosurgery;  Laterality: N/A;   BREAST BIOPSY Right    CHOLECYSTECTOMY     CRANIOTOMY Right 05/18/2021   Procedure: Right craniotomy for tumor resection;  Surgeon: Judith Part, MD;  Location: [facility];  Service: Neurosurgery;  Laterality: Right;   EYE SURGERY     as a child   HERNIA REPAIR     Patient Active Problem List   Diagnosis Date Noted   Abnormal findings on diagnostic imaging of other specified body structures 01/14/2022   Chronic pain 01/14/2022   Colon cancer screening 01/14/2022   Dysphagia 01/14/2022   Family history of malignant neoplasm of digestive organs 01/14/2022   Hyperglycemia due to type 2 diabetes mellitus ([place]) 01/14/2022   Hypertension 01/14/2022   Meralgia paresthetica 01/14/2022   Mixed hyperlipidemia 01/14/2022   Morbid obesity ([place]) 01/14/2022   Overweight 01/14/2022   Pure hypercholesterolemia 01/14/2022   Spinal cord disease ([place]) 01/14/2022   Chronic cough 12/01/2021   Diabetes ([place]) 07/08/2021   Glaucoma 07/08/2021   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   Visual field defect of left eye 07/08/2021   Debility 06/16/2021   Hypokalemia    Seizures ([facility])    Benign essential HTN    Controlled type 2 diabetes  mellitus with hyperglycemia, without long-term current use of insulin ([place])    Stridor 06/05/2021   Status post tracheostomy ([place])    Acute respiratory failure with hypoxia ([place])    Status epilepticus ([place])    Brain tumor ([place]) 05/18/2021    PCP: Seward Carol, MD   REFERRING PROVIDER: Seward Carol, MD   REFERRING DIAG: Diagnosis M48.061 (ICD-10-CM) - Spinal stenosis, lumbar region without neurogenic claudication   Rationale for Evaluation and Treatment: Rehabilitation  THERAPY DIAG:  Muscle weakness (generalized)  Unsteadiness on feet  Other low back pain  Difficulty in walking, not elsewhere classified  Chronic bilateral low back pain with bilateral sciatica  Cramp and spasm  Other lack of coordination  ONSET DATE: 07/06/2022   SUBJECTIVE:  SUBJECTIVE STATEMENT:  Pt reports she would still like to continue PT to work on strengthening legs and reduce pain in back of legs (goes to back of knee). Pt states posterior leg pain is intermittent.   PERTINENT HISTORY:  Meningioma, removed, R BG infarct, old  PAIN:  Are you having pain? Yes 4/10 posterior legs; 0/10 in back  PRECAUTIONS: None  WEIGHT BEARING RESTRICTIONS: No  FALLS:  Has patient fallen in last 6 months? No  LIVING ENVIRONMENT: Lives with: lives alone Lives in: House/apartment Stairs: Yes: External: 2 steps; on right going up Has following equipment at home: Single point cane and Walker - 2 wheeled  OCCUPATION: Receptionist, sitting, full time  PLOF: Independent  PATIENT GOALS: Decrease pain, be able to move around without pain   OBJECTIVE:   DIAGNOSTIC FINDINGS:  Lumbar spinal stenosis  PATIENT SURVEYS:  FOTO 40.26  SCREENING FOR RED FLAGS: Bowel or bladder incontinence: No Spinal tumors: No Cauda  equina syndrome: No Compression fracture: No Abdominal aneurysm: No  COGNITION: Overall cognitive status: Within functional limits for tasks assessed    MUSCLE LENGTH: Hamstrings: Right 82 deg; Left 84 deg B hip flexors appear tight  POSTURE: increased lumbar lordosis, decreased thoracic kyphosis, right pelvic obliquity, and R shoulder elevated RLE longer than L 3/4"  PALPATION: N TTP in back or buttocks or legs  LUMBAR ROM:   AROM eval 09/02/22 09/13/22  Flexion WNL WNLs WFL  Extension Severely limited Decreased 90% Decreased 50%  Right lateral flexion WNL WFLs WFL  Left lateral flexion 3" above knee WFLS WFL  Right rotation 30% WFLs WFL  Left rotation 40% WFLs WFL   (Blank rows = not tested)  LOWER EXTREMITY ROM:    WNL B  LOWER EXTREMITY MMT:    MMT Right eval Left eval RT/Left 09/02/22 RT/Left 10/07/22 Rt/Lt 10/18/22  Hip flexion 3+ 3+ 4/4 4/4 4/4  Hip extension 3- 3- 4/4 4+/4+ 3+/3+   Hip abduction 3 3 4/4 4+/4+ 3+/3+ without compensation in sidelying  Hip adduction       Hip internal rotation       Hip external rotation       Knee flexion 4- 4-  4+/4+ 5/5  Knee extension 3+ 3+  4/4 4+/4+  Ankle dorsiflexion 4 4  4/4 4+/4+  Ankle plantarflexion       Ankle inversion       Ankle eversion        (Blank rows = not tested)  LUMBAR SPECIAL TESTS:  Straight leg raise test: Negative and Slump test: Negative  FUNCTIONAL TESTS:  5 times sit to stand: 19.26    09/02/22 12.3 sec 10/18/22 13.9 sec Timed up and go (TUG): 14.50   09/02/22 no AD 8.1 sec Functional gait assessment: TBD  10/18/22 15/30  [facility] PT Assessment - 10/18/22 0001       Functional Gait  Assessment   Gait assessed  Yes    Gait Level Surface Walks 20 ft in less than 7 sec but greater than 5.5 sec, uses assistive device, slower speed, mild gait deviations, or deviates 6-10 in outside of the 12 in walkway width.    Change in Gait Speed Able to smoothly change walking speed without loss of balance  or gait deviation. Deviate no more than 6 in outside of the 12 in walkway width.    Gait with Horizontal Head Turns Performs head turns smoothly with slight change in gait velocity (eg, minor disruption to smooth gait path), deviates  6-10 in outside 12 in walkway width, or uses an assistive device.    Gait with Vertical Head Turns Performs task with slight change in gait velocity (eg, minor disruption to smooth gait path), deviates 6 - 10 in outside 12 in walkway width or uses assistive device    Gait and Pivot Turn Pivot turns safely in greater than 3 sec and stops with no loss of balance, or pivot turns safely within 3 sec and stops with mild imbalance, requires small steps to catch balance.    Step Over Obstacle Is able to step over one shoe box (4.5 in total height) but must slow down and adjust steps to clear box safely. May require verbal cueing.    Gait with Narrow Base of Support Ambulates less than 4 steps heel to toe or cannot perform without assistance.    Gait with Eyes Closed Walks 20 ft, slow speed, abnormal gait pattern, evidence for imbalance, deviates 10-15 in outside 12 in walkway width. Requires more than 9 sec to ambulate 20 ft.    Ambulating Backwards Walks 20 ft, slow speed, abnormal gait pattern, evidence for imbalance, deviates 10-15 in outside 12 in walkway width.    Steps Two feet to a stair, must use rail.    Total Score 15    FGA comment: 15/30             GAIT: Distance walked: at least 2 laps around gym  Assistive device utilized: Single point cane and it was set too tall, but she declined to lower it. Level of assistance: Modified independence Comments: Patient declined to adjust cane height.  TODAY'S TREATMENT:                                                                                                                              DATE: 10/18/22  Nustep L5 x 5 min Hamstring stretch x30 sec R&L Figure 4 stretch 2x30 sec R&L Bridging x10 Forward tap 4"  step 2x10 Side tap 4" step 2x10 R&L See assessments above   10/14/22 Nustep L 5 12mn Amb 3 laps without AD 1 min 46 sec Obstacle course stepping on and over object- CG-min A with HHA needed for 6 inch curb and large roll Obstacle course step over step Various 6 inch step ups ex- 2 HHA then 1 HHA Resisted gait 30 # 4 way 4 x each Seated row and lat pull down 20# 2 sets 12 Black tband trunk ext 2 sets 10   10/07/22 Nustep L 5 6 min MMT assessed STS wt ball chest press 10 x Standing wt ball rotation 10 each and 10 x OH press HS curl 20# 2 sets 10 Knee ext 5# 2 sets 10 ( 10# was too heavy) Seated row and lat pull down 20# 2 sets 12 Black tband trunk ext 2 sets 10 Amb 3 laps without AD about 350 feet  1 min 42 sec Forward and lateral 4 in  step ups x 10 each bilaterally HHA x 2    10/04/22 NuStep L4 x 6 min S2S holding yellow ball 2x10  Forward and lateral 4 in step ups x 10 each bilaterally HHA x 2  HS curls green 2x12 LAQ 2lb 2x12 each  Standing march 2lb 2x10 each Seated row and lats 2 sets 12 20#   09/16/22 Bike L 4 2 min then decreased L 2 4 min STS on airex, slight elevated mat 10x with CGA- some instability HHA on airex 3# ankle wts 10 x SL then 10 x alt Marching,hip flex SL ,hip abd and hip ext HHA marching fwd and back 10 feet 2 x, laterally 10 feet 2 x Standing HS curl 3# 2 sets 10 Wt ball trunk ext,rotation,chest press and OH 10 x each Amb 360 feet without AD SBA 2 min 30 sec Seated row and lats 2 sets 12 20# Hamstring Curls 20lb 2x12  Leg Ext 5lb 2x12.    09/13/22 NuStep L5 x5 min Foto Gait W/ SPC 360 feet reports some stinging int he back of her legs Hamstring Curls 20lb 2x12  Leg Ext 5lb 2x12. S2S holding red ball 2x10   09/06/22 NuStep L 5 x 6 min  Hamstring Curls 20lb 3x10  Leg Ext 5lb 3x10 Shoulder Ext 5lb 2x10  Standing rows 10lb 2x10 Sit to stand 2x10  Supine bridges 2x10 PROM/stretching LE and trunk   09/02/22 Nustep L 5 7 min HS  curl 25# 3 sets 10 Knee ext 5# 3 sets 10 ( attempted 10 # but too heavy) Black tband trunk flex and ext 2 sets 10 Seated row 20# 2 sets 10 STS on airex 10x 30# resisted gait 5 x 4 ways Green tband shld ext 2 sets 10 Green tband trunk ext 2 sets 10   08/26/22 Nustep L 5 6 min PROM/stretching LE and trunk Feet on ball bridge, KTC and obl 15 x each. Isometric abd ball squeeze 15 x Blue tband clams and hip flex 15 x each Blue tband trunk rotation 15 x each Seated HS curl blue tband 2 sets 10 Seated trunk ext tband 2 sets 10 Seated trunk rotation blue tband 10 x each STS feet on airex, no UE 2 sets 5 CGA Seated row and lat pull 2 sets 10   08/23/22 NuStep L4 x 6 minutes Supine bridging x 10 reps SLR x 10 each leg Clamshells with G tband resistance x 10 Lower Trunk Rotation Glut stretch in supine Sidelying hip abd, 5 reps each side Seated HS stretch Standing side step with red tband resistance, BUE support on counter Standing shoulder ext and Rows with Red Tband-2 x 10 reps Standing shoulder ER red tband resistance 2 x 10   08/12/22 NuStep L5 x56mns  Calf stretch 30s Calf raises 2x10 Rows and Lats 15# 2x10  Shoulder ext 5# 2x10 Standing 2 way hip abd/ext 2# 2x10 Heel taps 6" w/2# 2x10- CGA  Walking w/o AD 1 big lap  blackTB ext 2x10  08/09/22 NuStep L5 x664ms Step ups 4" with HHA Leg ext 5# 2x10 HS curls 15# 2x10 Step ups on airex- minA  S2S on airex with chest press redball 2x10 Side steps over obstacles Calf stretch 30s on bar Calf raises 2x10 on bar  08/05/22 Nustep L 4 6 min Knee ext 5# 2 sets 10 HS curl 15# 2 sets 10 HS stretch , active sitting 3 x each side 15 sec hold STS with wt ball chest press 10 x  STS on airex 10 x HHA on airex 20 x Alt  marching,hip flex,ext and abd CG-min A. ABD very challenging Ball btwn the knees squeeze 15 Seated green tband hip abd and flex 2 sets 10 Amb without AD 2 laps 300 feet 1 min 51 sec  08/02/22 NuStep  L5x23mns S2S 2x10 4" heel taps SBA   LAQ 2x10  HS curls greenTB 2x10 Hip abd greenTB 2x10 seated 2x10 Rows and ext greenTB 2x10  PATIENT EDUCATION:  Education details: POC Person educated: Patient Education method: Explanation Education comprehension: verbalized understanding  HOME EXERCISE PROGRAM:  [id number] ASSESSMENT:  CLINICAL IMPRESSION: Pt has met 4/6 of her prior long term goals. She has met her 5x STS and TUG scores; however, deficits remain with her amb and balance. Pt's greatest goal is to improve mobility to amb without cane and posterior leg pain. FGA indicates high fall risk. Pt has difficulty with alternating weight shift and single leg stability/narrow BOS. Pt demos hip flexor tightness and continued glute weakness with MMT today. Pt would benefit from continued PT to address these deficits and reach pt's personal goals.   OBJECTIVE IMPAIRMENTS: Abnormal gait, decreased activity tolerance, decreased balance, decreased coordination, decreased endurance, decreased mobility, difficulty walking, decreased ROM, decreased strength, increased muscle spasms, impaired flexibility, improper body mechanics, postural dysfunction, and pain.   ACTIVITY LIMITATIONS: carrying, lifting, bending, standing, squatting, sleeping, stairs, transfers, and locomotion level  PARTICIPATION LIMITATIONS: meal prep, cleaning, laundry, interpersonal relationship, driving, shopping, and occupation  PERSONAL FACTORS: Age, Past/current experiences, and 1 comorbidity: Meningioma  are also affecting patient's functional outcome.   REHAB POTENTIAL: Good  CLINICAL DECISION MAKING: Evolving/moderate complexity  EVALUATION COMPLEXITY: Moderate   GOALS: Goals reviewed with patient? Yes  SHORT TERM GOALS: Target date: 08/23/2022  I with basic HEP Baseline: Goal status: met 08/26/22  PRIOR LONG TERM GOALS: Target date: 10/18/2022  I with final HEP Baseline:  Goal status:  On going 10/07/22   10/14/22 evolvimg  2.  Increase FOTO score to at least 52 Baseline: 40 Goal status: Met  09/13/22  3.  Decrease TUG14.5 time to < 12 Baseline:  Goal status: 08/26/22 with SPC 9.58 sec MET  4.  Decrease 5 x STS to < 12 sec Baseline: 19 Goal status: 08/26/22 12.47sec progressing. 09/02/22 MET  5.  Patient will walk x at least 300' with LRAD, MI, no pain in legs Baseline:  Goal status: On going 09/13/22. 10/07/22 on going 10/14/22 distance met but pain  6.  Assess and update sitting posture in her workspace to provide adequate trunk support. Baseline:  Goal status: Progressing 09/13/22, progressing 10/07/22 MET 10/14/22  REVISED LONG TERM GOALS: Target date: 12/13/2022  I with final HEP for return to community wellness/gym (per pt goals) Baseline:  Goal status:  On going 10/07/22  10/14/22 evolvimg  2.  Patient will walk x at least 500' with LRAD, MI, no pain in legs Baseline: Had been able to do 300' with LRAD but had pain in posterior legs Goal status: Revised goal   3.  Pt will demo at least 4+/5 hip abd in sidelying for improved standing stability Baseline:  Goal status: Revised goal  4.  Pt will have improved FGA to >/=22/30 to demo decreased fall risk with amb Baseline:  Goal status: New  PLAN:  PT FREQUENCY: 1-2x/week  PT DURATION: 8 weeks  PLANNED INTERVENTIONS: Therapeutic exercises, Therapeutic activity, Neuromuscular re-education, Balance training, Gait training, Patient/Family education, Self Care, Joint mobilization, Stair training, Dry Needling,  Electrical stimulation, Cryotherapy, Moist heat, Ionotophoresis '4mg'$ /ml Dexamethasone, and Manual therapy.  PLAN FOR NEXT SESSION: Continue to work on strength, balance, gait/endurance   Abrina Petz April Ma L Crawford Tamura, PT 10/18/2022, 11:37 AM [facility]. [place], [place], [address] Phone: [phone]   Fax:  [phone]

## 2022-10-21 ENCOUNTER — Ambulatory Visit: Payer: Medicare Other | Admitting: Physical Therapy

## 2022-10-21 DIAGNOSIS — R262 Difficulty in walking, not elsewhere classified: Secondary | ICD-10-CM

## 2022-10-21 DIAGNOSIS — R2681 Unsteadiness on feet: Secondary | ICD-10-CM

## 2022-10-21 DIAGNOSIS — M6281 Muscle weakness (generalized): Secondary | ICD-10-CM | POA: Diagnosis not present

## 2022-10-21 DIAGNOSIS — G8929 Other chronic pain: Secondary | ICD-10-CM

## 2022-10-21 NOTE — Therapy (Signed)
OUTPATIENT PHYSICAL THERAPY THORACOLUMBAR TREATMENT    Patient Name: Olivia Shah MRN: [id number] DOB:08/30/1956, 67 y.o., female Today's Date: 10/21/2022   PT End of Session - 10/21/22 1701     Visit Number 16    Date for PT Re-Evaluation 11/19/22    PT Start Time 1700    PT Stop Time 1745    PT Time Calculation (min) 45 min               Past Medical History:  Diagnosis Date   Arthritis    Diabetes mellitus without complication ([place])    Hypertension    Past Surgical History:  Procedure Laterality Date   APPLICATION OF CRANIAL NAVIGATION N/A 05/18/2021   Procedure: APPLICATION OF CRANIAL NAVIGATION;  Surgeon: Judith Part, MD;  Location: [place];  Service: Neurosurgery;  Laterality: N/A;   BREAST BIOPSY Right    CHOLECYSTECTOMY     CRANIOTOMY Right 05/18/2021   Procedure: Right craniotomy for tumor resection;  Surgeon: Judith Part, MD;  Location: [facility];  Service: Neurosurgery;  Laterality: Right;   EYE SURGERY     as a child   HERNIA REPAIR     Patient Active Problem List   Diagnosis Date Noted   Abnormal findings on diagnostic imaging of other specified body structures 01/14/2022   Chronic pain 01/14/2022   Colon cancer screening 01/14/2022   Dysphagia 01/14/2022   Family history of malignant neoplasm of digestive organs 01/14/2022   Hyperglycemia due to type 2 diabetes mellitus ([place]) 01/14/2022   Hypertension 01/14/2022   Meralgia paresthetica 01/14/2022   Mixed hyperlipidemia 01/14/2022   Morbid obesity ([place]) 01/14/2022   Overweight 01/14/2022   Pure hypercholesterolemia 01/14/2022   Spinal cord disease ([place]) 01/14/2022   Chronic cough 12/01/2021   Diabetes ([place]) 07/08/2021   Glaucoma 07/08/2021   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   Visual field defect of left eye 07/08/2021   Debility 06/16/2021   Hypokalemia    Seizures ([facility])    Benign essential HTN    Controlled type 2 diabetes mellitus with hyperglycemia, without long-term current use  of insulin ([place])    Stridor 06/05/2021   Status post tracheostomy ([place])    Acute respiratory failure with hypoxia ([place])    Status epilepticus ([place])    Brain tumor ([place]) 05/18/2021    PCP: Seward Carol, MD   REFERRING PROVIDER: Seward Carol, MD   REFERRING DIAG: Diagnosis M48.061 (ICD-10-CM) - Spinal stenosis, lumbar region without neurogenic claudication   Rationale for Evaluation and Treatment: Rehabilitation  THERAPY DIAG:  Muscle weakness (generalized)  Unsteadiness on feet  Difficulty in walking, not elsewhere classified  Chronic bilateral low back pain with bilateral sciatica  ONSET DATE: 07/06/2022   SUBJECTIVE:  SUBJECTIVE STATEMENT:  pain in morning then goes away. Would like to walk without AD PERTINENT HISTORY:  Meningioma, removed, R BG infarct, old  PAIN:  Are you having pain? Yes 4/10 posterior legs; 0/10 in back  PRECAUTIONS: None  WEIGHT BEARING RESTRICTIONS: No  FALLS:  Has patient fallen in last 6 months? No  LIVING ENVIRONMENT: Lives with: lives alone Lives in: House/apartment Stairs: Yes: External: 2 steps; on right going up Has following equipment at home: Single point cane and Walker - 2 wheeled  OCCUPATION: Receptionist, sitting, full time  PLOF: Independent  PATIENT GOALS: Decrease pain, be able to move around without pain   OBJECTIVE:   DIAGNOSTIC FINDINGS:  Lumbar spinal stenosis  PATIENT SURVEYS:  FOTO 40.26  SCREENING FOR RED FLAGS: Bowel or bladder incontinence: No Spinal tumors: No Cauda equina syndrome: No Compression fracture: No Abdominal aneurysm: No  COGNITION: Overall cognitive status: Within functional limits for tasks assessed    MUSCLE LENGTH: Hamstrings: Right 82 deg; Left 84 deg B hip flexors appear tight  POSTURE:  increased lumbar lordosis, decreased thoracic kyphosis, right pelvic obliquity, and R shoulder elevated RLE longer than L 3/4"  PALPATION: N TTP in back or buttocks or legs  LUMBAR ROM:   AROM eval 09/02/22 09/13/22  Flexion WNL WNLs WFL  Extension Severely limited Decreased 90% Decreased 50%  Right lateral flexion WNL WFLs WFL  Left lateral flexion 3" above knee WFLS WFL  Right rotation 30% WFLs WFL  Left rotation 40% WFLs WFL   (Blank rows = not tested)  LOWER EXTREMITY ROM:    WNL B  LOWER EXTREMITY MMT:    MMT Right eval Left eval RT/Left 09/02/22 RT/Left 10/07/22 Rt/Lt 10/18/22  Hip flexion 3+ 3+ 4/4 4/4 4/4  Hip extension 3- 3- 4/4 4+/4+ 3+/3+   Hip abduction 3 3 4/4 4+/4+ 3+/3+ without compensation in sidelying  Hip adduction       Hip internal rotation       Hip external rotation       Knee flexion 4- 4-  4+/4+ 5/5  Knee extension 3+ 3+  4/4 4+/4+  Ankle dorsiflexion 4 4  4/4 4+/4+  Ankle plantarflexion       Ankle inversion       Ankle eversion        (Blank rows = not tested)  LUMBAR SPECIAL TESTS:  Straight leg raise test: Negative and Slump test: Negative  FUNCTIONAL TESTS:  5 times sit to stand: 19.26    09/02/22 12.3 sec 10/18/22 13.9 sec Timed up and go (TUG): 14.50   09/02/22 no AD 8.1 sec Functional gait assessment: TBD  10/18/22 15/30    GAIT: Distance walked: at least 2 laps around gym  Assistive device utilized: Single point cane and it was set too tall, but she declined to lower it. Level of assistance: Modified independence Comments: Patient declined to adjust cane height.  TODAY'S TREATMENT:  DATE:  10/21/22 Nustep L 5 10mn LE only Resisted gait 30 # 4 way 5 x each TM ( with UE support) 4 min- working on increased stride and lifter steps .7 mph HS curl 25# 2 sets 10 Knee ext 10# 3 sets 5 ( last week unable to do  10#) Standing red tband hip flex,ext and abd 10 x each ( pulling sensation in post leg with extension stopped when mvmt stopped, abd weak) Step ups 6 inch 10 x fwd, 10 x laterally with UE support- left leg weaker with step ups STS with wt ball press 2 sets 10     10/18/22  Nustep L5 x 5 min Hamstring stretch x30 sec R&L Figure 4 stretch 2x30 sec R&L Bridging x10 Forward tap 4" step 2x10 Side tap 4" step 2x10 R&L See assessments above   10/14/22 Nustep L 5 639m Amb 3 laps without AD 1 min 46 sec Obstacle course stepping on and over object- CG-min A with HHA needed for 6 inch curb and large roll Obstacle course step over step Various 6 inch step ups ex- 2 HHA then 1 HHA Resisted gait 30 # 4 way 4 x each Seated row and lat pull down 20# 2 sets 12 Black tband trunk ext 2 sets 10   10/07/22 Nustep L 5 6 min MMT assessed STS wt ball chest press 10 x Standing wt ball rotation 10 each and 10 x OH press HS curl 20# 2 sets 10 Knee ext 5# 2 sets 10 ( 10# was too heavy) Seated row and lat pull down 20# 2 sets 12 Black tband trunk ext 2 sets 10 Amb 3 laps without AD about 350 feet  1 min 42 sec Forward and lateral 4 in step ups x 10 each bilaterally HHA x 2    10/04/22 NuStep L4 x 6 min S2S holding yellow ball 2x10  Forward and lateral 4 in step ups x 10 each bilaterally HHA x 2  HS curls green 2x12 LAQ 2lb 2x12 each  Standing march 2lb 2x10 each Seated row and lats 2 sets 12 20#   09/16/22 Bike L 4 2 min then decreased L 2 4 min STS on airex, slight elevated mat 10x with CGA- some instability HHA on airex 3# ankle wts 10 x SL then 10 x alt Marching,hip flex SL ,hip abd and hip ext HHA marching fwd and back 10 feet 2 x, laterally 10 feet 2 x Standing HS curl 3# 2 sets 10 Wt ball trunk ext,rotation,chest press and OH 10 x each Amb 360 feet without AD SBA 2 min 30 sec Seated row and lats 2 sets 12 20# Hamstring Curls 20lb 2x12  Leg Ext 5lb 2x12.    09/13/22 NuStep L5  x5 min Foto Gait W/ SPC 360 feet reports some stinging int he back of her legs Hamstring Curls 20lb 2x12  Leg Ext 5lb 2x12. S2S holding red ball 2x10   09/06/22 NuStep L 5 x 6 min  Hamstring Curls 20lb 3x10  Leg Ext 5lb 3x10 Shoulder Ext 5lb 2x10  Standing rows 10lb 2x10 Sit to stand 2x10  Supine bridges 2x10 PROM/stretching LE and trunk   09/02/22 Nustep L 5 7 min HS curl 25# 3 sets 10 Knee ext 5# 3 sets 10 ( attempted 10 # but too heavy) Black tband trunk flex and ext 2 sets 10 Seated row 20# 2 sets 10 STS on airex 10x 30# resisted gait 5 x 4 ways Green tband shld ext  2 sets 10 Green tband trunk ext 2 sets 10   08/26/22 Nustep L 5 6 min PROM/stretching LE and trunk Feet on ball bridge, KTC and obl 15 x each. Isometric abd ball squeeze 15 x Blue tband clams and hip flex 15 x each Blue tband trunk rotation 15 x each Seated HS curl blue tband 2 sets 10 Seated trunk ext tband 2 sets 10 Seated trunk rotation blue tband 10 x each STS feet on airex, no UE 2 sets 5 CGA Seated row and lat pull 2 sets 10   08/23/22 NuStep L4 x 6 minutes Supine bridging x 10 reps SLR x 10 each leg Clamshells with G tband resistance x 10 Lower Trunk Rotation Glut stretch in supine Sidelying hip abd, 5 reps each side Seated HS stretch Standing side step with red tband resistance, BUE support on counter Standing shoulder ext and Rows with Red Tband-2 x 10 reps Standing shoulder ER red tband resistance 2 x 10   08/12/22 NuStep L5 x32mns  Calf stretch 30s Calf raises 2x10 Rows and Lats 15# 2x10  Shoulder ext 5# 2x10 Standing 2 way hip abd/ext 2# 2x10 Heel taps 6" w/2# 2x10- CGA  Walking w/o AD 1 big lap  blackTB ext 2x10  08/09/22 NuStep L5 x655ms Step ups 4" with HHA Leg ext 5# 2x10 HS curls 15# 2x10 Step ups on airex- minA  S2S on airex with chest press redball 2x10 Side steps over obstacles Calf stretch 30s on bar Calf raises 2x10 on bar  08/05/22 Nustep L 4 6  min Knee ext 5# 2 sets 10 HS curl 15# 2 sets 10 HS stretch , active sitting 3 x each side 15 sec hold STS with wt ball chest press 10 x STS on airex 10 x HHA on airex 20 x Alt  marching,hip flex,ext and abd CG-min A. ABD very challenging Ball btwn the knees squeeze 15 Seated green tband hip abd and flex 2 sets 10 Amb without AD 2 laps 300 feet 1 min 51 sec  08/02/22 NuStep L5x6m40m S2S 2x10 4" heel taps SBA   LAQ 2x10  HS curls greenTB 2x10 Hip abd greenTB 2x10 seated 2x10 Rows and ext greenTB 2x10  PATIENT EDUCATION:  Education details: POC Person educated: Patient Education method: Explanation Education comprehension: verbalized understanding  HOME EXERCISE PROGRAM:  [id number]:  CLINICAL IMPRESSION: renewal done last session. Pt wants to continue for  strength and gait with goals to not use AD. Pain is mostly just in morning when first gets up. Progressed strength and added TM today but very fatigued and had short step sand heavy feet- needed cuing   OBJECTIVE IMPAIRMENTS: Abnormal gait, decreased activity tolerance, decreased balance, decreased coordination, decreased endurance, decreased mobility, difficulty walking, decreased ROM, decreased strength, increased muscle spasms, impaired flexibility, improper body mechanics, postural dysfunction, and pain.   ACTIVITY LIMITATIONS: carrying, lifting, bending, standing, squatting, sleeping, stairs, transfers, and locomotion level  PARTICIPATION LIMITATIONS: meal prep, cleaning, laundry, interpersonal relationship, driving, shopping, and occupation  PERSONAL FACTORS: Age, Past/current experiences, and 1 comorbidity: Meningioma  are also affecting patient's functional outcome.   REHAB POTENTIAL: Good  CLINICAL DECISION MAKING: Evolving/moderate complexity  EVALUATION COMPLEXITY: Moderate   GOALS: Goals reviewed with patient? Yes  SHORT TERM GOALS: Target date: 08/23/2022  I with basic HEP Baseline: Goal  status: met 08/26/22  PRIOR LONG TERM GOALS: Target date: 10/18/2022  I with final HEP Baseline:  Goal status:  On going 10/07/22  10/14/22 evolvimg  2.  Increase FOTO score to at least 52 Baseline: 40 Goal status: Met  09/13/22  3.  Decrease TUG14.5 time to < 12 Baseline:  Goal status: 08/26/22 with SPC 9.58 sec MET  4.  Decrease 5 x STS to < 12 sec Baseline: 19 Goal status: 08/26/22 12.47sec progressing. 09/02/22 MET  5.  Patient will walk x at least 300' with LRAD, MI, no pain in legs Baseline:  Goal status: On going 09/13/22. 10/07/22 on going 10/14/22 distance met but pain  6.  Assess and update sitting posture in her workspace to provide adequate trunk support. Baseline:  Goal status: Progressing 09/13/22, progressing 10/07/22 MET 10/14/22  REVISED LONG TERM GOALS: Target date: 12/13/2022  I with final HEP for return to community wellness/gym (per pt goals) Baseline:  Goal status:  On going 10/07/22  10/14/22 evolvimg  2.  Patient will walk x at least 500' with LRAD, MI, no pain in legs Baseline: Had been able to do 300' with LRAD but had pain in posterior legs Goal status: Revised goal   3.  Pt will demo at least 4+/5 hip abd in sidelying for improved standing stability Baseline:  Goal status: Revised goal  4.  Pt will have improved FGA to >/=22/30 to demo decreased fall risk with amb Baseline:  Goal status: New  PLAN:  PT FREQUENCY: 1-2x/week  PT DURATION: 8 weeks  PLANNED INTERVENTIONS: Therapeutic exercises, Therapeutic activity, Neuromuscular re-education, Balance training, Gait training, Patient/Family education, Self Care, Joint mobilization, Stair training, Dry Needling, Electrical stimulation, Cryotherapy, Moist heat, Ionotophoresis '4mg'$ /ml Dexamethasone, and Manual therapy.  PLAN FOR NEXT SESSION: Continue to work on strength, balance, gait/endurance ( TM and no AD)   Johnice Riebe,ANGIE, PTA 10/21/2022, 5:04 PM [facility] [place]. [place], [place], [address] Phone: [phone]   Fax:  [facility] at [place]. [place], [place], [address] Phone: [phone]   Fax:  [phone]  Patient Details  Name: Olivia Shah MRN: [id number] Date of Birth: September 14, 1956 Referring Provider:  Seward Carol, MD  Encounter Date: 10/21/2022   Laqueta Carina, PTA 10/21/2022, 5:04 PM  [facility] at [place]. [place], [place], [address] Phone: [phone]   Fax:  [phone]

## 2022-10-21 NOTE — Therapy (Deleted)
[facility] at [place]. [place], [place], [address] Phone: [phone]   Fax:  [phone]  Patient Details  Name: Olivia Shah MRN: [id number] Date of Birth: 02/27/1956 Referring Provider:  Seward Carol, MD  Encounter Date: 10/21/2022   Laqueta Carina, PTA 10/21/2022, 5:03 PM  [facility] at [place]. [place], [place], [address] Phone: [phone]   Fax:  [phone]

## 2022-10-25 ENCOUNTER — Encounter: Payer: Self-pay | Admitting: Physical Therapy

## 2022-10-25 ENCOUNTER — Ambulatory Visit: Payer: Medicare Other | Admitting: Physical Therapy

## 2022-10-25 DIAGNOSIS — G8929 Other chronic pain: Secondary | ICD-10-CM

## 2022-10-25 DIAGNOSIS — M5459 Other low back pain: Secondary | ICD-10-CM

## 2022-10-25 DIAGNOSIS — R2681 Unsteadiness on feet: Secondary | ICD-10-CM

## 2022-10-25 DIAGNOSIS — R278 Other lack of coordination: Secondary | ICD-10-CM

## 2022-10-25 DIAGNOSIS — M6281 Muscle weakness (generalized): Secondary | ICD-10-CM | POA: Diagnosis not present

## 2022-10-25 DIAGNOSIS — R262 Difficulty in walking, not elsewhere classified: Secondary | ICD-10-CM

## 2022-10-25 NOTE — Therapy (Signed)
OUTPATIENT PHYSICAL THERAPY THORACOLUMBAR TREATMENT    Patient Name: Olivia Shah MRN: [id number] DOB:[birth year], 67 y.o., female Today's Date: 10/25/2022   PT End of Session - 10/25/22 1106     Visit Number 17    Date for PT Re-Evaluation 11/19/22    PT Start Time 1103    PT Stop Time 1141    PT Time Calculation (min) 38 min    Activity Tolerance Patient tolerated treatment well    Behavior During Therapy [facility] for tasks assessed/performed               Past Medical History:  Diagnosis Date   Arthritis    Diabetes mellitus without complication ([place])    Hypertension    Past Surgical History:  Procedure Laterality Date   APPLICATION OF CRANIAL NAVIGATION N/A 05/18/2021   Procedure: APPLICATION OF CRANIAL NAVIGATION;  Surgeon: Judith Part, MD;  Location: [place];  Service: Neurosurgery;  Laterality: N/A;   BREAST BIOPSY Right    CHOLECYSTECTOMY     CRANIOTOMY Right 05/18/2021   Procedure: Right craniotomy for tumor resection;  Surgeon: Judith Part, MD;  Location: [place];  Service: Neurosurgery;  Laterality: Right;   EYE SURGERY     as a child   HERNIA REPAIR     Patient Active Problem List   Diagnosis Date Noted   Abnormal findings on diagnostic imaging of other specified body structures 01/14/2022   Chronic pain 01/14/2022   Colon cancer screening 01/14/2022   Dysphagia 01/14/2022   Family history of malignant neoplasm of digestive organs 01/14/2022   Hyperglycemia due to type 2 diabetes mellitus ([place]) 01/14/2022   Hypertension 01/14/2022   Meralgia paresthetica 01/14/2022   Mixed hyperlipidemia 01/14/2022   Morbid obesity ([place]) 01/14/2022   Overweight 01/14/2022   Pure hypercholesterolemia 01/14/2022   Spinal cord disease ([place]) 01/14/2022   Chronic cough 12/01/2021   Diabetes ([place]) 07/08/2021   Glaucoma 07/08/2021   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   Visual field defect of left eye 07/08/2021   Debility 06/16/2021   Hypokalemia    Seizures ([facility])     Benign essential HTN    Controlled type 2 diabetes mellitus with hyperglycemia, without long-term current use of insulin ([place])    Stridor 06/05/2021   Status post tracheostomy ([place])    Acute respiratory failure with hypoxia ([place])    Status epilepticus ([place])    Brain tumor ([place]) 05/18/2021    PCP: Seward Carol, MD   REFERRING PROVIDER: Seward Carol, MD   REFERRING DIAG: Diagnosis M48.061 (ICD-10-CM) - Spinal stenosis, lumbar region without neurogenic claudication   Rationale for Evaluation and Treatment: Rehabilitation  THERAPY DIAG:  Muscle weakness (generalized)  Unsteadiness on feet  Difficulty in walking, not elsewhere classified  Other low back pain  Other lack of coordination  Chronic bilateral low back pain with bilateral sciatica  ONSET DATE: 07/06/2022   SUBJECTIVE:  SUBJECTIVE STATEMENT:  Patient reports that her L knee is sore. She did not perform her HEP over the weekend.  PERTINENT HISTORY:  Meningioma, removed, R BG infarct, old  PAIN:  Are you having pain? Yes 4/10 posterior legs; 0/10 in back  PRECAUTIONS: None  WEIGHT BEARING RESTRICTIONS: No  FALLS:  Has patient fallen in last 6 months? No  LIVING ENVIRONMENT: Lives with: lives alone Lives in: House/apartment Stairs: Yes: External: 2 steps; on right going up Has following equipment at home: Single point cane and Walker - 2 wheeled  OCCUPATION: Receptionist, sitting, full time  PLOF: Independent  PATIENT GOALS: Decrease pain, be able to move around without pain   OBJECTIVE:   DIAGNOSTIC FINDINGS:  Lumbar spinal stenosis  PATIENT SURVEYS:  FOTO 40.26  SCREENING FOR RED FLAGS: Bowel or bladder incontinence: No Spinal tumors: No Cauda equina syndrome: No Compression fracture: No Abdominal  aneurysm: No  COGNITION: Overall cognitive status: Within functional limits for tasks assessed    MUSCLE LENGTH: Hamstrings: Right 82 deg; Left 84 deg B hip flexors appear tight  POSTURE: increased lumbar lordosis, decreased thoracic kyphosis, right pelvic obliquity, and R shoulder elevated RLE longer than L 3/4"  PALPATION: N TTP in back or buttocks or legs  LUMBAR ROM:   AROM eval 09/02/22 09/13/22  Flexion WNL WNLs WFL  Extension Severely limited Decreased 90% Decreased 50%  Right lateral flexion WNL WFLs WFL  Left lateral flexion 3" above knee WFLS WFL  Right rotation 30% WFLs WFL  Left rotation 40% WFLs WFL   (Blank rows = not tested)  LOWER EXTREMITY ROM:    WNL B  LOWER EXTREMITY MMT:    MMT Right eval Left eval RT/Left 09/02/22 RT/Left 10/07/22 Rt/Lt 10/18/22  Hip flexion 3+ 3+ 4/4 4/4 4/4  Hip extension 3- 3- 4/4 4+/4+ 3+/3+   Hip abduction 3 3 4/4 4+/4+ 3+/3+ without compensation in sidelying  Hip adduction       Hip internal rotation       Hip external rotation       Knee flexion 4- 4-  4+/4+ 5/5  Knee extension 3+ 3+  4/4 4+/4+  Ankle dorsiflexion 4 4  4/4 4+/4+  Ankle plantarflexion       Ankle inversion       Ankle eversion        (Blank rows = not tested)  LUMBAR SPECIAL TESTS:  Straight leg raise test: Negative and Slump test: Negative  FUNCTIONAL TESTS:  5 times sit to stand: 19.26    09/02/22 12.3 sec 10/18/22 13.9 sec Timed up and go (TUG): 14.50   09/02/22 no AD 8.1 sec Functional gait assessment: TBD  10/18/22 15/30    GAIT: Distance walked: at least 2 laps around gym  Assistive device utilized: Single point cane and it was set too tall, but she declined to lower it. Level of assistance: Modified independence Comments: Patient declined to adjust cane height.  TODAY'S TREATMENT:  DATE: 10/25/22 NuStep L3 x 6  minutes Supine over Blue physioball- bridge x 10, repeat with hip IR, B KTC , but reported pain moving into the backs of her legs, bridge with side to side roll- 10 each Supine SLR x 10 each leg, clamshells,  Supine stretch B KTC, LTR, ITB stretch Side lying hip abd, 10 reps each side Seated HS stretch, 3 x 15 seconds each leg  10/21/22 Nustep L 5 37mn LE only Resisted gait 30 # 4 way 5 x each TM ( with UE support) 4 min- working on increased stride and lifter steps .7 mph HS curl 25# 2 sets 10 Knee ext 10# 3 sets 5 ( last week unable to do 10#) Standing red tband hip flex,ext and abd 10 x each ( pulling sensation in post leg with extension stopped when mvmt stopped, abd weak) Step ups 6 inch 10 x fwd, 10 x laterally with UE support- left leg weaker with step ups STS with wt ball press 2 sets 10  10/18/22  Nustep L5 x 5 min Hamstring stretch x30 sec R&L Figure 4 stretch 2x30 sec R&L Bridging x10 Forward tap 4" step 2x10 Side tap 4" step 2x10 R&L See assessments above  10/14/22 Nustep L 5 632m Amb 3 laps without AD 1 min 46 sec Obstacle course stepping on and over object- CG-min A with HHA needed for 6 inch curb and large roll Obstacle course step over step Various 6 inch step ups ex- 2 HHA then 1 HHA Resisted gait 30 # 4 way 4 x each Seated row and lat pull down 20# 2 sets 12 Black tband trunk ext 2 sets 10  10/07/22 Nustep L 5 6 min MMT assessed STS wt ball chest press 10 x Standing wt ball rotation 10 each and 10 x OH press HS curl 20# 2 sets 10 Knee ext 5# 2 sets 10 ( 10# was too heavy) Seated row and lat pull down 20# 2 sets 12 Black tband trunk ext 2 sets 10 Amb 3 laps without AD about 350 feet  1 min 42 sec Forward and lateral 4 in step ups x 10 each bilaterally HHA x 2    10/04/22 NuStep L4 x 6 min S2S holding yellow ball 2x10  Forward and lateral 4 in step ups x 10 each bilaterally HHA x 2  HS curls green 2x12 LAQ 2lb 2x12 each  Standing march 2lb 2x10  each Seated row and lats 2 sets 12 20#   09/16/22 Bike L 4 2 min then decreased L 2 4 min STS on airex, slight elevated mat 10x with CGA- some instability HHA on airex 3# ankle wts 10 x SL then 10 x alt Marching,hip flex SL ,hip abd and hip ext HHA marching fwd and back 10 feet 2 x, laterally 10 feet 2 x Standing HS curl 3# 2 sets 10 Wt ball trunk ext,rotation,chest press and OH 10 x each Amb 360 feet without AD SBA 2 min 30 sec Seated row and lats 2 sets 12 20# Hamstring Curls 20lb 2x12  Leg Ext 5lb 2x12.    09/13/22 NuStep L5 x5 min Foto Gait W/ SPC 360 feet reports some stinging int he back of her legs Hamstring Curls 20lb 2x12  Leg Ext 5lb 2x12. S2S holding red ball 2x10   09/06/22 NuStep L 5 x 6 min  Hamstring Curls 20lb 3x10  Leg Ext 5lb 3x10 Shoulder Ext 5lb 2x10  Standing rows 10lb 2x10 Sit to stand 2x10  Supine bridges 2x10 PROM/stretching LE and trunk   PATIENT EDUCATION:  Education details: POC Person educated: Patient Education method: Explanation Education comprehension: verbalized understanding  HOME EXERCISE PROGRAM:  [id number]  ASSESSMENT:  CLINICAL IMPRESSION: Patient reports new onset of L knee pain today. It improved with the warm up. Reviewed and practiced exercises from HEP as she reports she forgot to perform them this weekend. Emphasizing both stretch and strengthening to help control pain.   OBJECTIVE IMPAIRMENTS: Abnormal gait, decreased activity tolerance, decreased balance, decreased coordination, decreased endurance, decreased mobility, difficulty walking, decreased ROM, decreased strength, increased muscle spasms, impaired flexibility, improper body mechanics, postural dysfunction, and pain.   ACTIVITY LIMITATIONS: carrying, lifting, bending, standing, squatting, sleeping, stairs, transfers, and locomotion level  PARTICIPATION LIMITATIONS: meal prep, cleaning, laundry, interpersonal relationship, driving, shopping, and  occupation  PERSONAL FACTORS: Age, Past/current experiences, and 1 comorbidity: Meningioma  are also affecting patient's functional outcome.   REHAB POTENTIAL: Good  CLINICAL DECISION MAKING: Evolving/moderate complexity  EVALUATION COMPLEXITY: Moderate   GOALS: Goals reviewed with patient? Yes  SHORT TERM GOALS: Target date: 08/23/2022  I with basic HEP Baseline: Goal status: met 08/26/22  PRIOR LONG TERM GOALS: Target date: 10/18/2022  I with final HEP Baseline:  Goal status:  On going 10/07/22  10/14/22 evolvimg  2.  Increase FOTO score to at least 52 Baseline: 40 Goal status: Met  09/13/22  3.  Decrease TUG14.5 time to < 12 Baseline:  Goal status: 08/26/22 with SPC 9.58 sec MET  4.  Decrease 5 x STS to < 12 sec Baseline: 19 Goal status: 08/26/22 12.47sec progressing. 09/02/22 MET  5.  Patient will walk x at least 300' with LRAD, MI, no pain in legs Baseline:  Goal status: On going 09/13/22. 10/07/22 on going 10/14/22 distance met but pain  6.  Assess and update sitting posture in her workspace to provide adequate trunk support. Baseline:  Goal status: Progressing 09/13/22, progressing 10/07/22 MET 10/14/22  REVISED LONG TERM GOALS: Target date: 12/13/2022  I with final HEP for return to community wellness/gym (per pt goals) Baseline:  Goal status:  On going 10/07/22  10/14/22 evolvimg  2.  Patient will walk x at least 500' with LRAD, MI, no pain in legs Baseline: Had been able to do 300' with LRAD but had pain in posterior legs Goal status: Revised goal   3.  Pt will demo at least 4+/5 hip abd in sidelying for improved standing stability Baseline:  Goal status: Revised goal  4.  Pt will have improved FGA to >/=22/30 to demo decreased fall risk with amb Baseline:  Goal status: New  PLAN:  PT FREQUENCY: 1-2x/week  PT DURATION: 8 weeks  PLANNED INTERVENTIONS: Therapeutic exercises, Therapeutic activity, Neuromuscular re-education, Balance training, Gait  training, Patient/Family education, Self Care, Joint mobilization, Stair training, Dry Needling, Electrical stimulation, Cryotherapy, Moist heat, Ionotophoresis '4mg'$ /ml Dexamethasone, and Manual therapy.  PLAN FOR NEXT SESSION: Continue to work on strength, balance, gait/endurance ( TM and no AD)   Marcelina Morel, PT 10/25/2022, 11:43 AM [facility] at [place]. [place], [place], [address] Phone: [phone]   Fax:  [facility] [place]. [place], [place], [address] Phone: [phone]   Fax:  [phone]  Patient Details  Name: LAYCEE FITZSIMMONS MRN: [id number] Date of Birth: 06/06/1956 Referring Provider:  Seward Carol, MD  Encounter Date: 10/25/2022   Marcelina Morel, PT 10/25/2022, 11:43 AM  [facility] at [place]. [place], [place], [address] Phone: [phone]   Fax:  [phone]

## 2022-10-28 ENCOUNTER — Telehealth: Payer: Self-pay | Admitting: Neurology

## 2022-10-28 ENCOUNTER — Ambulatory Visit: Payer: Medicare Other | Attending: Internal Medicine | Admitting: Physical Therapy

## 2022-10-28 DIAGNOSIS — M5459 Other low back pain: Secondary | ICD-10-CM | POA: Insufficient documentation

## 2022-10-28 DIAGNOSIS — R252 Cramp and spasm: Secondary | ICD-10-CM | POA: Insufficient documentation

## 2022-10-28 DIAGNOSIS — M6281 Muscle weakness (generalized): Secondary | ICD-10-CM | POA: Diagnosis present

## 2022-10-28 DIAGNOSIS — R262 Difficulty in walking, not elsewhere classified: Secondary | ICD-10-CM

## 2022-10-28 DIAGNOSIS — R278 Other lack of coordination: Secondary | ICD-10-CM | POA: Insufficient documentation

## 2022-10-28 DIAGNOSIS — G8929 Other chronic pain: Secondary | ICD-10-CM | POA: Diagnosis present

## 2022-10-28 DIAGNOSIS — R2681 Unsteadiness on feet: Secondary | ICD-10-CM | POA: Insufficient documentation

## 2022-10-28 DIAGNOSIS — M5442 Lumbago with sciatica, left side: Secondary | ICD-10-CM | POA: Insufficient documentation

## 2022-10-28 DIAGNOSIS — M5441 Lumbago with sciatica, right side: Secondary | ICD-10-CM | POA: Insufficient documentation

## 2022-10-28 NOTE — Therapy (Signed)
OUTPATIENT PHYSICAL THERAPY THORACOLUMBAR TREATMENT    Patient Name: Olivia Shah MRN: [id number] DOB:11-22-1955, 67 y.o., female Today's Date: 10/28/2022   PT End of Session - 10/28/22 1711     Visit Number 18    Date for PT Re-Evaluation 11/19/22    PT Start Time 1651    PT Stop Time 1740    PT Time Calculation (min) 49 min               Past Medical History:  Diagnosis Date   Arthritis    Diabetes mellitus without complication ([place])    Hypertension    Past Surgical History:  Procedure Laterality Date   APPLICATION OF CRANIAL NAVIGATION N/A 05/18/2021   Procedure: APPLICATION OF CRANIAL NAVIGATION;  Surgeon: Judith Part, MD;  Location: [facility];  Service: Neurosurgery;  Laterality: N/A;   BREAST BIOPSY Right    CHOLECYSTECTOMY     CRANIOTOMY Right 05/18/2021   Procedure: Right craniotomy for tumor resection;  Surgeon: Judith Part, MD;  Location: [facility];  Service: Neurosurgery;  Laterality: Right;   EYE SURGERY     as a child   HERNIA REPAIR     Patient Active Problem List   Diagnosis Date Noted   Abnormal findings on diagnostic imaging of other specified body structures 01/14/2022   Chronic pain 01/14/2022   Colon cancer screening 01/14/2022   Dysphagia 01/14/2022   Family history of malignant neoplasm of digestive organs 01/14/2022   Hyperglycemia due to type 2 diabetes mellitus ([place]) 01/14/2022   Hypertension 01/14/2022   Meralgia paresthetica 01/14/2022   Mixed hyperlipidemia 01/14/2022   Morbid obesity ([place]) 01/14/2022   Overweight 01/14/2022   Pure hypercholesterolemia 01/14/2022   Spinal cord disease ([place]) 01/14/2022   Chronic cough 12/01/2021   Diabetes ([place]) 07/08/2021   Glaucoma 07/08/2021   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   Visual field defect of left eye 07/08/2021   Debility 06/16/2021   Hypokalemia    Seizures ([facility])    Benign essential HTN    Controlled type 2 diabetes mellitus with hyperglycemia, without long-term current use of  insulin ([place])    Stridor 06/05/2021   Status post tracheostomy ([place])    Acute respiratory failure with hypoxia ([place])    Status epilepticus ([place])    Brain tumor ([place]) 05/18/2021    PCP: Seward Carol, MD   REFERRING PROVIDER: Seward Carol, MD   REFERRING DIAG: Diagnosis M48.061 (ICD-10-CM) - Spinal stenosis, lumbar region without neurogenic claudication   Rationale for Evaluation and Treatment: Rehabilitation  THERAPY DIAG:  Muscle weakness (generalized)  Unsteadiness on feet  Difficulty in walking, not elsewhere classified  Other low back pain  ONSET DATE: 07/06/2022   SUBJECTIVE:  SUBJECTIVE STATEMENT:  pain is in posterior legs BIL down to calves- " I think I need to get back to MD" PERTINENT HISTORY:  Meningioma, removed, R BG infarct, old  PAIN:  Are you having pain? Yes 4/10 posterior legs; 0/10 in back  PRECAUTIONS: None  WEIGHT BEARING RESTRICTIONS: No  FALLS:  Has patient fallen in last 6 months? No  LIVING ENVIRONMENT: Lives with: lives alone Lives in: House/apartment Stairs: Yes: External: 2 steps; on right going up Has following equipment at home: Single point cane and Walker - 2 wheeled  OCCUPATION: Receptionist, sitting, full time  PLOF: Independent  PATIENT GOALS: Decrease pain, be able to move around without pain   OBJECTIVE:   DIAGNOSTIC FINDINGS:  Lumbar spinal stenosis  PATIENT SURVEYS:  FOTO 40.26  SCREENING FOR RED FLAGS: Bowel or bladder incontinence: No Spinal tumors: No Cauda equina syndrome: No Compression fracture: No Abdominal aneurysm: No  COGNITION: Overall cognitive status: Within functional limits for tasks assessed    MUSCLE LENGTH: Hamstrings: Right 82 deg; Left 84 deg B hip flexors appear tight  POSTURE: increased lumbar  lordosis, decreased thoracic kyphosis, right pelvic obliquity, and R shoulder elevated RLE longer than L 3/4"  PALPATION: N TTP in back or buttocks or legs  LUMBAR ROM:   AROM eval 09/02/22 09/13/22  Flexion WNL WNLs WFL  Extension Severely limited Decreased 90% Decreased 50%  Right lateral flexion WNL WFLs WFL  Left lateral flexion 3" above knee WFLS WFL  Right rotation 30% WFLs WFL  Left rotation 40% WFLs WFL   (Blank rows = not tested)  LOWER EXTREMITY ROM:    WNL B  LOWER EXTREMITY MMT:    MMT Right eval Left eval RT/Left 09/02/22 RT/Left 10/07/22 Rt/Lt 10/18/22  Hip flexion 3+ 3+ 4/4 4/4 4/4  Hip extension 3- 3- 4/4 4+/4+ 3+/3+   Hip abduction 3 3 4/4 4+/4+ 3+/3+ without compensation in sidelying  Hip adduction       Hip internal rotation       Hip external rotation       Knee flexion 4- 4-  4+/4+ 5/5  Knee extension 3+ 3+  4/4 4+/4+  Ankle dorsiflexion 4 4  4/4 4+/4+  Ankle plantarflexion       Ankle inversion       Ankle eversion        (Blank rows = not tested)  LUMBAR SPECIAL TESTS:  Straight leg raise test: Negative and Slump test: Negative  FUNCTIONAL TESTS:  5 times sit to stand: 19.26    09/02/22 12.3 sec 10/18/22 13.9 sec Timed up and go (TUG): 14.50   09/02/22 no AD 8.1 sec Functional gait assessment: TBD  10/18/22 15/30    GAIT: Distance walked: at least 2 laps around gym  Assistive device utilized: Single point cane and it was set too tall, but she declined to lower it. Level of assistance: Modified independence Comments: Patient declined to adjust cane height.  TODAY'S TREATMENT:  DATE:  10/28/22 Nustep L 4 565mn  Mod dead lift without wt 10x Black bar DF and PF 20 x Feet on ball bridge 2 sets 10, obl 20, KTC 10x Isometric abdominals 15 x hold 3sec Supine ppt 15 x hold 3 sec Supine clams with green tband and ppt 2  sets 10 Supine hip flexion green tband and ppt 2 sets 10 PROM and stretches to back and LE with neural tension glides  10/25/22 NuStep L3 x 6 minutes Supine over Blue physioball- bridge x 10, repeat with hip IR, B KTC , but reported pain moving into the backs of her legs, bridge with side to side roll- 10 each Supine SLR x 10 each leg, clamshells,  Supine stretch B KTC, LTR, ITB stretch Side lying hip abd, 10 reps each side Seated HS stretch, 3 x 15 seconds each leg  10/21/22 Nustep L 5 651m LE only Resisted gait 30 # 4 way 5 x each TM ( with UE support) 4 min- working on increased stride and lifter steps .7 mph HS curl 25# 2 sets 10 Knee ext 10# 3 sets 5 ( last week unable to do 10#) Standing red tband hip flex,ext and abd 10 x each ( pulling sensation in post leg with extension stopped when mvmt stopped, abd weak) Step ups 6 inch 10 x fwd, 10 x laterally with UE support- left leg weaker with step ups STS with wt ball press 2 sets 10  10/18/22  Nustep L5 x 5 min Hamstring stretch x30 sec R&L Figure 4 stretch 2x30 sec R&L Bridging x10 Forward tap 4" step 2x10 Side tap 4" step 2x10 R&L See assessments above  10/14/22 Nustep L 5 65m24mAmb 3 laps without AD 1 min 46 sec Obstacle course stepping on and over object- CG-min A with HHA needed for 6 inch curb and large roll Obstacle course step over step Various 6 inch step ups ex- 2 HHA then 1 HHA Resisted gait 30 # 4 way 4 x each Seated row and lat pull down 20# 2 sets 12 Black tband trunk ext 2 sets 10  10/07/22 Nustep L 5 6 min MMT assessed STS wt ball chest press 10 x Standing wt ball rotation 10 each and 10 x OH press HS curl 20# 2 sets 10 Knee ext 5# 2 sets 10 ( 10# was too heavy) Seated row and lat pull down 20# 2 sets 12 Black tband trunk ext 2 sets 10 Amb 3 laps without AD about 350 feet  1 min 42 sec Forward and lateral 4 in step ups x 10 each bilaterally HHA x 2    10/04/22 NuStep L4 x 6 min S2S holding yellow ball  2x10  Forward and lateral 4 in step ups x 10 each bilaterally HHA x 2  HS curls green 2x12 LAQ 2lb 2x12 each  Standing march 2lb 2x10 each Seated row and lats 2 sets 12 20#   09/16/22 Bike L 4 2 min then decreased L 2 4 min STS on airex, slight elevated mat 10x with CGA- some instability HHA on airex 3# ankle wts 10 x SL then 10 x alt Marching,hip flex SL ,hip abd and hip ext HHA marching fwd and back 10 feet 2 x, laterally 10 feet 2 x Standing HS curl 3# 2 sets 10 Wt ball trunk ext,rotation,chest press and OH 10 x each Amb 360 feet without AD SBA 2 min 30 sec Seated row and lats 2 sets 12 20# Hamstring Curls  20lb 2x12  Leg Ext 5lb 2x12.    09/13/22 NuStep L5 x5 min Foto Gait W/ SPC 360 feet reports some stinging int he back of her legs Hamstring Curls 20lb 2x12  Leg Ext 5lb 2x12. S2S holding red ball 2x10   09/06/22 NuStep L 5 x 6 min  Hamstring Curls 20lb 3x10  Leg Ext 5lb 3x10 Shoulder Ext 5lb 2x10  Standing rows 10lb 2x10 Sit to stand 2x10  Supine bridges 2x10 PROM/stretching LE and trunk   PATIENT EDUCATION:  Education details: POC Person educated: Patient Education method: Explanation Education comprehension: verbalized understanding  HOME EXERCISE PROGRAM:  [id number]  ASSESSMENT:  CLINICAL IMPRESSION: pt arrived with persistent radiating pain posterior legs BIL to calves. Pain seems more there than not now so we discussed retuning to MD. Back in 2022 we treated for same pain and had good relief so increased back stab ex with cuing. Assessed goals OBJECTIVE IMPAIRMENTS: Abnormal gait, decreased activity tolerance, decreased balance, decreased coordination, decreased endurance, decreased mobility, difficulty walking, decreased ROM, decreased strength, increased muscle spasms, impaired flexibility, improper body mechanics, postural dysfunction, and pain.   ACTIVITY LIMITATIONS: carrying, lifting, bending, standing, squatting, sleeping, stairs, transfers,  and locomotion level  PARTICIPATION LIMITATIONS: meal prep, cleaning, laundry, interpersonal relationship, driving, shopping, and occupation  PERSONAL FACTORS: Age, Past/current experiences, and 1 comorbidity: Meningioma  are also affecting patient's functional outcome.   REHAB POTENTIAL: Good  CLINICAL DECISION MAKING: Evolving/moderate complexity  EVALUATION COMPLEXITY: Moderate   GOALS: Goals reviewed with patient? Yes  SHORT TERM GOALS: Target date: 08/23/2022  I with basic HEP Baseline: Goal status: met 08/26/22  PRIOR LONG TERM GOALS: Target date: 10/18/2022  I with final HEP Baseline:  Goal status:  On going 10/07/22  10/14/22 evolvimg  10/28/22 ongoing  2.  Increase FOTO score to at least 52 Baseline: 40 Goal status: Met  09/13/22  3.  Decrease TUG14.5 time to < 12 Baseline:  Goal status: 08/26/22 with SPC 9.58 sec MET  4.  Decrease 5 x STS to < 12 sec Baseline: 19 Goal status: 08/26/22 12.47sec progressing. 09/02/22 MET  5.  Patient will walk x at least 300' with LRAD, MI, no pain in legs Baseline:  Goal status: On going 09/13/22. 10/07/22 on going 10/14/22 distance met but pain 10/28/22 pain is an issue  6.  Assess and update sitting posture in her workspace to provide adequate trunk support. Baseline:  Goal status: Progressing 09/13/22, progressing 10/07/22 MET 10/14/22  REVISED LONG TERM GOALS: Target date: 12/13/2022  I with final HEP for return to community wellness/gym (per pt goals) Baseline:  Goal status:  On going 10/07/22  10/14/22 evolvimg  2.  Patient will walk x at least 500' with LRAD, MI, no pain in legs Baseline: Had been able to do 300' with LRAD but had pain in posterior legs Goal status: Revised goal   3.  Pt will demo at least 4+/5 hip abd in sidelying for improved standing stability Baseline:  Goal status: Revised goal  ongoing 10/28/22  4.  Pt will have improved FGA to >/=22/30 to demo decreased fall risk with amb Baseline:  Goal status:  New  PLAN:  PT FREQUENCY: 1-2x/week  PT DURATION: 8 weeks  PLANNED INTERVENTIONS: Therapeutic exercises, Therapeutic activity, Neuromuscular re-education, Balance training, Gait training, Patient/Family education, Self Care, Joint mobilization, Stair training, Dry Needling, Electrical stimulation, Cryotherapy, Moist heat, Ionotophoresis '4mg'$ /ml Dexamethasone, and Manual therapy.  PLAN FOR NEXT SESSION: assess and progress  Bretton Tandy,ANGIE, PTA 10/28/2022, 5:13 PM  [facility] at [place]. [place], [place], [address] Phone: [phone]   Fax:  [facility] at [place]. [place], [place], [address] Phone: [phone]   Fax:  [phone]  Patient Details  Name: Olivia Shah MRN: [id number] Date of Birth: 07-Jun-1956 Referring Provider:  Seward Carol, MD  Encounter Date: 10/28/2022   Laqueta Carina, PTA 10/28/2022, 5:13 PM  [facility] [place]. [place], [place], [address] Phone: [phone]   Fax:  [facility] at [place]. [place], [place], [address] Phone: [phone]   Fax:  [phone]  Patient Details  Name: Olivia Shah MRN: [id number] Date of Birth: March 31, 1956 Referring Provider:  Seward Carol, MD  Encounter Date: 10/28/2022   Laqueta Carina, PTA 10/28/2022, 5:13 PM  [facility] [place]. [place], [place], [address] Phone: [phone]   Fax:  [phone]

## 2022-10-28 NOTE — Telephone Encounter (Signed)
Pt's niece called requesting to speak to the RN or MD regarding the pt's lacosamide (VIMPAT) 200 MG TABS tablet. Niece would not give any other information.

## 2022-11-01 ENCOUNTER — Ambulatory Visit: Payer: Medicare Other

## 2022-11-01 DIAGNOSIS — R2681 Unsteadiness on feet: Secondary | ICD-10-CM

## 2022-11-01 DIAGNOSIS — M6281 Muscle weakness (generalized): Secondary | ICD-10-CM | POA: Diagnosis not present

## 2022-11-01 DIAGNOSIS — R262 Difficulty in walking, not elsewhere classified: Secondary | ICD-10-CM

## 2022-11-01 DIAGNOSIS — M5459 Other low back pain: Secondary | ICD-10-CM

## 2022-11-01 DIAGNOSIS — R252 Cramp and spasm: Secondary | ICD-10-CM

## 2022-11-01 DIAGNOSIS — G8929 Other chronic pain: Secondary | ICD-10-CM

## 2022-11-01 DIAGNOSIS — R278 Other lack of coordination: Secondary | ICD-10-CM

## 2022-11-01 NOTE — Therapy (Signed)
OUTPATIENT PHYSICAL THERAPY THORACOLUMBAR TREATMENT    Patient Name: Olivia Shah MRN: [id number] DOB:07/06/1956, 67 y.o., female Today's Date: 11/01/2022   PT End of Session - 11/01/22 1100     Visit Number 19    Date for PT Re-Evaluation 11/19/22    PT Start Time 1100    PT Stop Time 1148    PT Time Calculation (min) 48 min    Activity Tolerance Patient tolerated treatment well                Past Medical History:  Diagnosis Date   Arthritis    Diabetes mellitus without complication ([place])    Hypertension    Past Surgical History:  Procedure Laterality Date   APPLICATION OF CRANIAL NAVIGATION N/A 05/18/2021   Procedure: APPLICATION OF CRANIAL NAVIGATION;  Surgeon: Judith Part, MD;  Location: [facility];  Service: Neurosurgery;  Laterality: N/A;   BREAST BIOPSY Right    CHOLECYSTECTOMY     CRANIOTOMY Right 05/18/2021   Procedure: Right craniotomy for tumor resection;  Surgeon: Judith Part, MD;  Location: [facility];  Service: Neurosurgery;  Laterality: Right;   EYE SURGERY     as a child   HERNIA REPAIR     Patient Active Problem List   Diagnosis Date Noted   Abnormal findings on diagnostic imaging of other specified body structures 01/14/2022   Chronic pain 01/14/2022   Colon cancer screening 01/14/2022   Dysphagia 01/14/2022   Family history of malignant neoplasm of digestive organs 01/14/2022   Hyperglycemia due to type 2 diabetes mellitus ([place]) 01/14/2022   Hypertension 01/14/2022   Meralgia paresthetica 01/14/2022   Mixed hyperlipidemia 01/14/2022   Morbid obesity ([place]) 01/14/2022   Overweight 01/14/2022   Pure hypercholesterolemia 01/14/2022   Spinal cord disease ([place]) 01/14/2022   Chronic cough 12/01/2021   Diabetes ([place]) 07/08/2021   Glaucoma 07/08/2021   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   Visual field defect of left eye 07/08/2021   Debility 06/16/2021   Hypokalemia    Seizures ([facility])    Benign essential HTN    Controlled type 2 diabetes  mellitus with hyperglycemia, without long-term current use of insulin ([place])    Stridor 06/05/2021   Status post tracheostomy ([place])    Acute respiratory failure with hypoxia ([place])    Status epilepticus ([place])    Brain tumor ([place]) 05/18/2021    PCP: Seward Carol, MD   REFERRING PROVIDER: Seward Carol, MD   REFERRING DIAG: Diagnosis M48.061 (ICD-10-CM) - Spinal stenosis, lumbar region without neurogenic claudication   Rationale for Evaluation and Treatment: Rehabilitation  THERAPY DIAG:  Muscle weakness (generalized)  Unsteadiness on feet  Difficulty in walking, not elsewhere classified  Other low back pain  Other lack of coordination  Chronic bilateral low back pain with bilateral sciatica  Cramp and spasm  ONSET DATE: 07/06/2022   SUBJECTIVE:  SUBJECTIVE STATEMENT:  I felt better after last Rx, felt better over the weekend.  I think therapy is helping me PERTINENT HISTORY:  Meningioma, removed, R BG infarct, old  PAIN:  Are you having pain? Yes 4/10 posterior legs; 0/10 in back  PRECAUTIONS: None  WEIGHT BEARING RESTRICTIONS: No  FALLS:  Has patient fallen in last 6 months? No  LIVING ENVIRONMENT: Lives with: lives alone Lives in: House/apartment Stairs: Yes: External: 2 steps; on right going up Has following equipment at home: Single point cane and Walker - 2 wheeled  OCCUPATION: Receptionist, sitting, full time  PLOF: Independent  PATIENT GOALS: Decrease pain, be able to move around without pain   OBJECTIVE:   DIAGNOSTIC FINDINGS:  Lumbar spinal stenosis  PATIENT SURVEYS:  FOTO 40.26  SCREENING FOR RED FLAGS: Bowel or bladder incontinence: No Spinal tumors: No Cauda equina syndrome: No Compression fracture: No Abdominal aneurysm: No  COGNITION: Overall  cognitive status: Within functional limits for tasks assessed    MUSCLE LENGTH: Hamstrings: Right 82 deg; Left 84 deg B hip flexors appear tight  POSTURE: increased lumbar lordosis, decreased thoracic kyphosis, right pelvic obliquity, and R shoulder elevated RLE longer than L 3/4"  PALPATION: N TTP in back or buttocks or legs  LUMBAR ROM:   AROM eval 09/02/22 09/13/22  Flexion WNL WNLs WFL  Extension Severely limited Decreased 90% Decreased 50%  Right lateral flexion WNL WFLs WFL  Left lateral flexion 3" above knee WFLS WFL  Right rotation 30% WFLs WFL  Left rotation 40% WFLs WFL   (Blank rows = not tested)  LOWER EXTREMITY ROM:    WNL B  LOWER EXTREMITY MMT:    MMT Right eval Left eval RT/Left 09/02/22 RT/Left 10/07/22 Rt/Lt 10/18/22  Hip flexion 3+ 3+ 4/4 4/4 4/4  Hip extension 3- 3- 4/4 4+/4+ 3+/3+   Hip abduction 3 3 4/4 4+/4+ 3+/3+ without compensation in sidelying  Hip adduction       Hip internal rotation       Hip external rotation       Knee flexion 4- 4-  4+/4+ 5/5  Knee extension 3+ 3+  4/4 4+/4+  Ankle dorsiflexion 4 4  4/4 4+/4+  Ankle plantarflexion       Ankle inversion       Ankle eversion        (Blank rows = not tested)  LUMBAR SPECIAL TESTS:  Straight leg raise test: Negative and Slump test: Negative  FUNCTIONAL TESTS:  5 times sit to stand: 19.26    09/02/22 12.3 sec 10/18/22 13.9 sec Timed up and go (TUG): 14.50   09/02/22 no AD 8.1 sec Functional gait assessment: TBD  10/18/22 15/30    GAIT: Distance walked: at least 2 laps around gym  Assistive device utilized: Single point cane and it was set too tall, but she declined to lower it. Level of assistance: Modified independence Comments: Patient declined to adjust cane height.  TODAY'S TREATMENT:  DATE: Nustep L 5  33mn B Ue's and LE's Black bar plantarflexor  stretch 30 sec each side, 3 sets  Black bar DF and PF 20 x Feet on ball bridge 2 sets 10, obl 20, KTC 10x Supine ppt 10 x hold 3 sec Supine isometric knee to chest 5 sec holds, 10 x each side Supine clams with green tband and ppt 2 sets 10 Supine hip flexion green tband and ppt 2 sets 10 Sidelying B for hip abduction with leg straight, therapist lightly supported leg to assist with eccentric lowering.  2 sets each 12 reps  PROM and stretches to back and LE with neural tension glides Seated tailor's stretch 30 sec hold 3 reps each leg   2Feb 20, 2024Nustep L 4 719m  Mod dead lift without wt 10x Black bar DF and PF 20 x Feet on ball bridge 2 sets 10, obl 20, KTC 10x Isometric abdominals 15 x hold 3sec Supine ppt 15 x hold 3 sec Supine clams with green tband and ppt 2 sets 10 Supine hip flexion green tband and ppt 2 sets 10 PROM and stretches to back and LE with neural tension glides  10/25/22 NuStep L3 x 6 minutes Supine over Blue physioball- bridge x 10, repeat with hip IR, B KTC , but reported pain moving into the backs of her legs, bridge with side to side roll- 10 each Supine SLR x 10 each leg, clamshells,  Supine stretch B KTC, LTR, ITB stretch Side lying hip abd, 10 reps each side Seated HS stretch, 3 x 15 seconds each leg  10/21/22 Nustep L 5 [id number] only Resisted gait 30 # 4 way 5 x each TM ( with UE support) 4 min- working on increased stride and lifter steps .7 mph HS curl 25# 2 sets 10 Knee ext 10# 3 sets 5 ( last week unable to do 10#) Standing red tband hip flex,ext and abd 10 x each ( pulling sensation in post leg with extension stopped when mvmt stopped, abd weak) Step ups 6 inch 10 x fwd, 10 x laterally with UE support- left leg weaker with step ups STS with wt ball press 2 sets 10  10/18/22  Nustep L5 x 5 min Hamstring stretch x30 sec R&L Figure 4 stretch 2x30 sec R&L Bridging x10 Forward tap 4" step 2x10 Side tap 4" step 2x10 R&L See assessments  above  10/14/22 Nustep L 5 60mi65mmb 3 laps without AD 1 min 46 sec Obstacle course stepping on and over object- CG-min A with HHA needed for 6 inch curb and large roll Obstacle course step over step Various 6 inch step ups ex- 2 HHA then 1 HHA Resisted gait 30 # 4 way 4 x each Seated row and lat pull down 20# 2 sets 12 Black tband trunk ext 2 sets 10  10/07/22 Nustep L 5 6 min MMT assessed STS wt ball chest press 10 x Standing wt ball rotation 10 each and 10 x OH press HS curl 20# 2 sets 10 Knee ext 5# 2 sets 10 ( 10# was too heavy) Seated row and lat pull down 20# 2 sets 12 Black tband trunk ext 2 sets 10 Amb 3 laps without AD about 350 feet  1 min 42 sec Forward and lateral 4 in step ups x 10 each bilaterally HHA x 2    10/04/22 NuStep L4 x 6 min S2S holding yellow ball 2x10  Forward and lateral 4 in step ups x 10 each bilaterally  HHA x 2  HS curls green 2x12 LAQ 2lb 2x12 each  Standing march 2lb 2x10 each Seated row and lats 2 sets 12 20#   09/16/22 Bike L 4 2 min then decreased L 2 4 min STS on airex, slight elevated mat 10x with CGA- some instability HHA on airex 3# ankle wts 10 x SL then 10 x alt Marching,hip flex SL ,hip abd and hip ext HHA marching fwd and back 10 feet 2 x, laterally 10 feet 2 x Standing HS curl 3# 2 sets 10 Wt ball trunk ext,rotation,chest press and OH 10 x each Amb 360 feet without AD SBA 2 min 30 sec Seated row and lats 2 sets 12 20# Hamstring Curls 20lb 2x12  Leg Ext 5lb 2x12.    09/13/22 NuStep L5 x5 min Foto Gait W/ SPC 360 feet reports some stinging int he back of her legs Hamstring Curls 20lb 2x12  Leg Ext 5lb 2x12. S2S holding red ball 2x10   09/06/22 NuStep L 5 x 6 min  Hamstring Curls 20lb 3x10  Leg Ext 5lb 3x10 Shoulder Ext 5lb 2x10  Standing rows 10lb 2x10 Sit to stand 2x10  Supine bridges 2x10 PROM/stretching LE and trunk   PATIENT EDUCATION:  Education details: POC Person educated: Patient Education method:  Explanation Education comprehension: verbalized understanding  HOME EXERCISE PROGRAM:  [id number]  ASSESSMENT:  CLINICAL IMPRESSION: patient is attending skilled physical therapy to address B lower leg pain and dysfunction related to spinal stenosis.  She reports improvement since last Rx, over the weekend.  Hasn't made a follow up appt yet with referring MD.  She does demonstrate lurching gait with trendelenberg B, greater L than R.  Continued with stability training for Ls region and additional hip strengthening.     OBJECTIVE IMPAIRMENTS: Abnormal gait, decreased activity tolerance, decreased balance, decreased coordination, decreased endurance, decreased mobility, difficulty walking, decreased ROM, decreased strength, increased muscle spasms, impaired flexibility, improper body mechanics, postural dysfunction, and pain.   ACTIVITY LIMITATIONS: carrying, lifting, bending, standing, squatting, sleeping, stairs, transfers, and locomotion level  PARTICIPATION LIMITATIONS: meal prep, cleaning, laundry, interpersonal relationship, driving, shopping, and occupation  PERSONAL FACTORS: Age, Past/current experiences, and 1 comorbidity: Meningioma  are also affecting patient's functional outcome.   REHAB POTENTIAL: Good  CLINICAL DECISION MAKING: Evolving/moderate complexity  EVALUATION COMPLEXITY: Moderate   GOALS: Goals reviewed with patient? Yes  SHORT TERM GOALS: Target date: 08/23/2022  I with basic HEP Baseline: Goal status: met 08/26/22  PRIOR LONG TERM GOALS: Target date: 10/18/2022  I with final HEP Baseline:  Goal status:  On going 10/07/22  10/14/22 evolvimg  10/28/22 ongoing  2.  Increase FOTO score to at least 52 Baseline: 40 Goal status: Met  09/13/22  3.  Decrease TUG14.5 time to < 12 Baseline:  Goal status: 08/26/22 with SPC 9.58 sec MET  4.  Decrease 5 x STS to < 12 sec Baseline: 19 Goal status: 08/26/22 12.47sec progressing. 09/02/22 MET  5.  Patient will walk x  at least 300' with LRAD, MI, no pain in legs Baseline:  Goal status: On going 09/13/22. 10/07/22 on going 10/14/22 distance met but pain 10/28/22 pain is an issue  6.  Assess and update sitting posture in her workspace to provide adequate trunk support. Baseline:  Goal status: Progressing 09/13/22, progressing 10/07/22 MET 10/14/22  REVISED LONG TERM GOALS: Target date: 12/13/2022  I with final HEP for return to community wellness/gym (per pt goals) Baseline:  Goal status:  On going 10/07/22  10/14/22 evolvimg  2.  Patient will walk x at least 500' with LRAD, MI, no pain in legs Baseline: Had been able to do 300' with LRAD but had pain in posterior legs Goal status: Revised goal   3.  Pt will demo at least 4+/5 hip abd in sidelying for improved standing stability Baseline:  Goal status: Revised goal  ongoing 10/28/22  4.  Pt will have improved FGA to >/=22/30 to demo decreased fall risk with amb Baseline:  Goal status: New  PLAN:  PT FREQUENCY: 1-2x/week  PT DURATION: 8 weeks  PLANNED INTERVENTIONS: Therapeutic exercises, Therapeutic activity, Neuromuscular re-education, Balance training, Gait training, Patient/Family education, Self Care, Joint mobilization, Stair training, Dry Needling, Electrical stimulation, Cryotherapy, Moist heat, Ionotophoresis '4mg'$ /ml Dexamethasone, and Manual therapy.  PLAN FOR NEXT SESSION: progress with hip stability  Ayra Hodgdon Pamella Pert, PT 11/01/2022, 11:59 AM [facility] at [place]. [place], [place], [address] Phone: [phone]   Fax:  [facility] at [place]. [place], [place], [address] Phone: [phone]   Fax:  [phone]  Patient Details  Name: RACQUELLE HYSER MRN: [id number] Date of Birth: 18-Feb-1956 Referring Provider:  Seward Carol, MD  Encounter Date: 11/01/2022   Tim Lair, PT 11/01/2022, 11:59 AM  [facility] at [place]. [place], [place], [address] Phone: [phone]   Fax:  [facility] at [place]. [place], [place], [address] Phone: [phone]   Fax:  [phone]  Patient Details  Name: TAHIRIH LAIR MRN: [id number] Date of Birth: 05-18-1956 Referring Provider:  Seward Carol, MD  Encounter Date: 11/01/2022   Tim Lair, PT 11/01/2022, 11:59 AM  [facility] at [place]. [place], [place], [address] Phone: [phone]   Fax:  [phone]

## 2022-11-04 ENCOUNTER — Ambulatory Visit: Payer: Medicare Other | Admitting: Physical Therapy

## 2022-11-04 DIAGNOSIS — M6281 Muscle weakness (generalized): Secondary | ICD-10-CM

## 2022-11-04 DIAGNOSIS — R262 Difficulty in walking, not elsewhere classified: Secondary | ICD-10-CM

## 2022-11-04 DIAGNOSIS — M5459 Other low back pain: Secondary | ICD-10-CM

## 2022-11-04 DIAGNOSIS — R2681 Unsteadiness on feet: Secondary | ICD-10-CM

## 2022-11-04 NOTE — Therapy (Signed)
OUTPATIENT PHYSICAL THERAPY THORACOLUMBAR TREATMENT   Progress Note Reporting Period 09/16/22 to 11/04/22  See note below for Objective Data and Assessment of Progress/Goals.     Patient Name: Olivia Shah MRN: [id number] DOB:[birth year], 67 y.o., female Today's Date: 11/04/2022   PT End of Session - 11/04/22 1703     Visit Number 20    Date for PT Re-Evaluation 11/19/22    PT Start Time 1700    PT Stop Time 1745    PT Time Calculation (min) 45 min                Past Medical History:  Diagnosis Date   Arthritis    Diabetes mellitus without complication ([place])    Hypertension    Past Surgical History:  Procedure Laterality Date   APPLICATION OF CRANIAL NAVIGATION N/A 05/18/2021   Procedure: APPLICATION OF CRANIAL NAVIGATION;  Surgeon: Judith Part, Shah;  Location: [facility];  Service: Neurosurgery;  Laterality: N/A;   BREAST BIOPSY Right    CHOLECYSTECTOMY     CRANIOTOMY Right 05/18/2021   Procedure: Right craniotomy for tumor resection;  Surgeon: Judith Part, Shah;  Location: [facility];  Service: Neurosurgery;  Laterality: Right;   EYE SURGERY     as a child   HERNIA REPAIR     Patient Active Problem List   Diagnosis Date Noted   Abnormal findings on diagnostic imaging of other specified body structures 01/14/2022   Chronic pain 01/14/2022   Colon cancer screening 01/14/2022   Dysphagia 01/14/2022   Family history of malignant neoplasm of digestive organs 01/14/2022   Hyperglycemia due to type 2 diabetes mellitus ([place]) 01/14/2022   Hypertension 01/14/2022   Meralgia paresthetica 01/14/2022   Mixed hyperlipidemia 01/14/2022   Morbid obesity ([place]) 01/14/2022   Overweight 01/14/2022   Pure hypercholesterolemia 01/14/2022   Spinal cord disease ([place]) 01/14/2022   Chronic cough 12/01/2021   Diabetes ([place]) 07/08/2021   Glaucoma 07/08/2021   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   Visual field defect of left eye 07/08/2021   Debility 06/16/2021   Hypokalemia     Seizures (H[facility])    Benign essential HTN    Controlled type 2 diabetes mellitus with hyperglycemia, without long-term current use of insulin ([place])    Stridor 06/05/2021   Status post tracheostomy ([place])    Acute respiratory failure with hypoxia ([place])    Status epilepticus ([place])    Brain tumor ([place]) 05/18/2021    PCP: Seward Carol, Shah   REFERRING PROVIDER: Seward Carol, Shah   REFERRING DIAG: Diagnosis M48.061 (ICD-10-CM) - Spinal stenosis, lumbar region without neurogenic claudication   Rationale for Evaluation and Treatment: Rehabilitation  THERAPY DIAG:  Muscle weakness (generalized)  Unsteadiness on feet  Difficulty in walking, not elsewhere classified  Other low back pain  ONSET DATE: 07/06/2022   SUBJECTIVE:  SUBJECTIVE STATEMENT:  stretching helps. LBP in middle and posterior leg pain. Shah appt in march  PERTINENT HISTORY:  Meningioma, removed, R BG infarct, old  PAIN:  Are you having pain? Yes 4/10 posterior legs; 2/10 in back  PRECAUTIONS: None  WEIGHT BEARING RESTRICTIONS: No  FALLS:  Has patient fallen in last 6 months? No  LIVING ENVIRONMENT: Lives with: lives alone Lives in: House/apartment Stairs: Yes: External: 2 steps; on right going up Has following equipment at home: Single point cane and Walker - 2 wheeled  OCCUPATION: Receptionist, sitting, full time  PLOF: Independent  PATIENT GOALS: Decrease pain, be able to move around without pain   OBJECTIVE:   DIAGNOSTIC FINDINGS:  Lumbar spinal stenosis  PATIENT SURVEYS:  FOTO 40.26  SCREENING FOR RED FLAGS: Bowel or bladder incontinence: No Spinal tumors: No Cauda equina syndrome: No Compression fracture: No Abdominal aneurysm: No  COGNITION: Overall cognitive status: Within functional limits for tasks  assessed    MUSCLE LENGTH: Hamstrings: Right 82 deg; Left 84 deg B hip flexors appear tight  POSTURE: increased lumbar lordosis, decreased thoracic kyphosis, right pelvic obliquity, and R shoulder elevated RLE longer than L 3/4"  PALPATION: N TTP in back or buttocks or legs  LUMBAR ROM:   AROM eval 09/02/22 09/13/22  Flexion WNL WNLs WFL  Extension Severely limited Decreased 90% Decreased 50%  Right lateral flexion WNL WFLs WFL  Left lateral flexion 3" above knee WFLS WFL  Right rotation 30% WFLs WFL  Left rotation 40% WFLs WFL   (Blank rows = not tested)  LOWER EXTREMITY ROM:    WNL B  LOWER EXTREMITY MMT:    MMT Right eval Left eval RT/Left 09/02/22 RT/Left 10/07/22 Rt/Lt 10/18/22  Hip flexion 3+ 3+ 4/4 4/4 4/4  Hip extension 3- 3- 4/4 4+/4+ 3+/3+   Hip abduction 3 3 4/4 4+/4+ 3+/3+ without compensation in sidelying  Hip adduction       Hip internal rotation       Hip external rotation       Knee flexion 4- 4-  4+/4+ 5/5  Knee extension 3+ 3+  4/4 4+/4+  Ankle dorsiflexion 4 4  4/4 4+/4+  Ankle plantarflexion       Ankle inversion       Ankle eversion        (Blank rows = not tested)  LUMBAR SPECIAL TESTS:  Straight leg raise test: Negative and Slump test: Negative  FUNCTIONAL TESTS:  5 times sit to stand: 19.26    09/02/22 12.3 sec 10/18/22 13.9 sec Timed up and go (TUG): 14.50   09/02/22 no AD 8.1 sec Functional gait assessment: TBD  10/18/22 15/30    GAIT: Distance walked: at least 2 laps around gym  Assistive device utilized: Single point cane and it was set too tall, but she declined to lower it. Level of assistance: Modified independence Comments: Patient declined to adjust cane height.  TODAY'S TREATMENT:  DATE:  11/04/22 Nustep L 5  52mn B Ue's and LE's Modified sit ups 10 x STS with wt ball chest press 10 x Standing wt  ball trunk rotation 12 x each Seated wt ball diagonals 10 each way 3 way plyoball roll outs 10 each way Supine clams with green tband and ppt 2 sets 10 Supine hip flexion green tband and ppt 2 sets 10 Feet on ball bridge, obl and KTC Iso abdominals upper and lower with ball PROM LE and trunk     Nustep L 5  7960m B Ue's and LE's Black bar plantarflexor stretch 30 sec each side, 3 sets  Black bar DF and PF 20 x Feet on ball bridge 2 sets 10, obl 20, KTC 10x Supine ppt 10 x hold 3 sec Supine isometric knee to chest 5 sec holds, 10 x each side Supine clams with green tband and ppt 2 sets 10 Supine hip flexion green tband and ppt 2 sets 10 Sidelying B for hip abduction with leg straight, therapist lightly supported leg to assist with eccentric lowering.  2 sets each 12 reps  PROM and stretches to back and LE with neural tension glides Seated tailor's stretch 30 sec hold 3 reps each leg   2/Feb 03, 2024ustep L 4 60m53m Mod dead lift without wt 10x Black bar DF and PF 20 x Feet on ball bridge 2 sets 10, obl 20, KTC 10x Isometric abdominals 15 x hold 3sec Supine ppt 15 x hold 3 sec Supine clams with green tband and ppt 2 sets 10 Supine hip flexion green tband and ppt 2 sets 10 PROM and stretches to back and LE with neural tension glides  10/25/22 NuStep L3 x 6 minutes Supine over Blue physioball- bridge x 10, repeat with hip IR, B KTC , but reported pain moving into the backs of her legs, bridge with side to side roll- 10 each Supine SLR x 10 each leg, clamshells,  Supine stretch B KTC, LTR, ITB stretch Side lying hip abd, 10 reps each side Seated HS stretch, 3 x 15 seconds each leg  10/21/22 Nustep L 5 6mi50mE only Resisted gait 30 # 4 way 5 x each TM ( with UE support) 4 min- working on increased stride and lifter steps .7 mph HS curl 25# 2 sets 10 Knee ext 10# 3 sets 5 ( last week unable to do 10#) Standing red tband hip flex,ext and abd 10 x each ( pulling sensation in post leg  with extension stopped when mvmt stopped, abd weak) Step ups 6 inch 10 x fwd, 10 x laterally with UE support- left leg weaker with step ups STS with wt ball press 2 sets 10  10/18/22  Nustep L5 x 5 min Hamstring stretch x30 sec R&L Figure 4 stretch 2x30 sec R&L Bridging x10 Forward tap 4" step 2x10 Side tap 4" step 2x10 R&L See assessments above  10/14/22 Nustep L 5 6min41mb 3 laps without AD 1 min 46 sec Obstacle course stepping on and over object- CG-min A with HHA needed for 6 inch curb and large roll Obstacle course step over step Various 6 inch step ups ex- 2 HHA then 1 HHA Resisted gait 30 # 4 way 4 x each Seated row and lat pull down 20# 2 sets 12 Black tband trunk ext 2 sets 10  10/07/22 Nustep L 5 6 min MMT assessed STS wt ball chest press 10 x Standing wt ball rotation 10 each and 10 x  OH press HS curl 20# 2 sets 10 Knee ext 5# 2 sets 10 ( 10# was too heavy) Seated row and lat pull down 20# 2 sets 12 Black tband trunk ext 2 sets 10 Amb 3 laps without AD about 350 feet  1 min 42 sec Forward and lateral 4 in step ups x 10 each bilaterally HHA x 2    10/04/22 NuStep L4 x 6 min S2S holding yellow ball 2x10  Forward and lateral 4 in step ups x 10 each bilaterally HHA x 2  HS curls green 2x12 LAQ 2lb 2x12 each  Standing march 2lb 2x10 each Seated row and lats 2 sets 12 20#   09/16/22 Bike L 4 2 min then decreased L 2 4 min STS on airex, slight elevated mat 10x with CGA- some instability HHA on airex 3# ankle wts 10 x SL then 10 x alt Marching,hip flex SL ,hip abd and hip ext HHA marching fwd and back 10 feet 2 x, laterally 10 feet 2 x Standing HS curl 3# 2 sets 10 Wt ball trunk ext,rotation,chest press and OH 10 x each Amb 360 feet without AD SBA 2 min 30 sec Seated row and lats 2 sets 12 20# Hamstring Curls 20lb 2x12  Leg Ext 5lb 2x12.    09/13/22 NuStep L5 x5 min Foto Gait W/ SPC 360 feet reports some stinging int he back of her legs Hamstring Curls  20lb 2x12  Leg Ext 5lb 2x12. S2S holding red ball 2x10   09/06/22 NuStep L 5 x 6 min  Hamstring Curls 20lb 3x10  Leg Ext 5lb 3x10 Shoulder Ext 5lb 2x10  Standing rows 10lb 2x10 Sit to stand 2x10  Supine bridges 2x10 PROM/stretching LE and trunk   PATIENT EDUCATION:  Education details: POC Person educated: Patient Education method: Explanation Education comprehension: verbalized understanding  HOME EXERCISE PROGRAM:  [id number]  ASSESSMENT:  CLINICAL IMPRESSION: patient is attending skilled physical therapy to address B lower leg pain and dysfunction related to spinal stenosis.  She reports improvement since last Rx, over the weekend.  follow up appt  referring Shah in [facility].  She does demonstrate lurching gait with trendelenberg B, greater L than R.  Continued with stability training for Ls region and additional hip strengthening.  Goals assessed   OBJECTIVE IMPAIRMENTS: Abnormal gait, decreased activity tolerance, decreased balance, decreased coordination, decreased endurance, decreased mobility, difficulty walking, decreased ROM, decreased strength, increased muscle spasms, impaired flexibility, improper body mechanics, postural dysfunction, and pain.   ACTIVITY LIMITATIONS: carrying, lifting, bending, standing, squatting, sleeping, stairs, transfers, and locomotion level  PARTICIPATION LIMITATIONS: meal prep, cleaning, laundry, interpersonal relationship, driving, shopping, and occupation  PERSONAL FACTORS: Age, Past/current experiences, and 1 comorbidity: Meningioma  are also affecting patient's functional outcome.   REHAB POTENTIAL: Good  CLINICAL DECISION MAKING: Evolving/moderate complexity  EVALUATION COMPLEXITY: Moderate   GOALS: Goals reviewed with patient? Yes  SHORT TERM GOALS: Target date: 08/23/2022  I with basic HEP Baseline: Goal status: met 08/26/22  PRIOR LONG TERM GOALS: Target date: 10/18/2022  I with final HEP Baseline:  Goal status:  On going  10/07/22  10/14/22 [email or web address]  10/28/22 ongoing 11/04/22 on going  2.  Increase FOTO score to at least 52 Baseline: 40 Goal status: Met  09/13/22  3.  Decrease TUG14.5 time to < 12 Baseline:  Goal status: 08/26/22 with SPC 9.58 sec MET  4.  Decrease 5 x STS to < 12 sec Baseline: 19 Goal status: 08/26/22 12.47sec progressing. 09/02/22 MET  5.  Patient will walk x at least 300' with LRAD, MI, no pain in legs Baseline:  Goal status: On going 09/13/22. 10/07/22 on going 10/14/22 distance met but pain 10/28/22 pain is an issue  Distance met but pain 11/04/22  6.  Assess and update sitting posture in her workspace to provide adequate trunk support. Baseline:  Goal status: Progressing 09/13/22, progressing 10/07/22 MET 10/14/22  REVISED LONG TERM GOALS: Target date: 12/13/2022  I with final HEP for return to community wellness/gym (per pt goals) Baseline:  Goal status:  On going 10/07/22  10/14/22 evolving 11/04/22  2.  Patient will walk x at least 500' with LRAD, MI, no pain in legs Baseline: Had been able to do 300' with LRAD but had pain in posterior legs Goal status: Revised goal 11/04/22 On going  3.  Pt will demo at least 4+/5 hip abd in sidelying for improved standing stability Baseline:  Goal status: Revised goal  ongoing 10/28/22 Progressing 11/04/22  4.  Pt will have improved FGA to >/=22/30 to demo decreased fall risk with amb Baseline:  Goal status: New  PLAN:  PT FREQUENCY: 1-2x/week  PT DURATION: 8 weeks  PLANNED INTERVENTIONS: Therapeutic exercises, Therapeutic activity, Neuromuscular re-education, Balance training, Gait training, Patient/Family education, Self Care, Joint mobilization, Stair training, Dry Needling, Electrical stimulation, Cryotherapy, Moist heat, Ionotophoresis 21m/ml Dexamethasone, and Manual therapy.  PLAN FOR NEXT SESSION: progress with hip stability. 20th visit done  PF[facility] PTA 11/04/2022, 5:04 PM [place] [id number]: [phone]  Fax:  [place] [address]Phone: [phone]  Fax:  [phone] Patient Details  Name: Olivia Shah  Encounter Date: 11/04/2022   PLaqueta Carina PTA 11/04/2022, 5:04 PM  [place] [address]Phone: [phone]  Fax:  [address] [address]Phone: [phone]  Fax:  [phone] Patient Details  Name: Olivia Shah  Encounter Date: 11/04/2022   PLaqueta Carina PTA 11/04/2022, 5:04 PM  [place] [address]Phone: [phone]  Fax:  [address] [address]Phone: [phone]  Fax:  [phone] Patient Details  Name: Olivia HECKELMRN: [id number] of Birth: 712-Mar-1957Referring Provider:  PSeward Carol Shah  Encounter Date: 11/04/2022   PLaqueta Carina PTA 11/04/2022, 5:04 PM  [place]at [place] [address]Phone: [phone]  Fax:  [phone]

## 2022-11-08 ENCOUNTER — Ambulatory Visit: Payer: Medicare Other | Admitting: Physical Therapy

## 2022-11-11 ENCOUNTER — Ambulatory Visit: Payer: Medicare Other | Admitting: Physical Therapy

## 2022-11-11 DIAGNOSIS — R262 Difficulty in walking, not elsewhere classified: Secondary | ICD-10-CM

## 2022-11-11 DIAGNOSIS — M6281 Muscle weakness (generalized): Secondary | ICD-10-CM

## 2022-11-11 DIAGNOSIS — M5459 Other low back pain: Secondary | ICD-10-CM

## 2022-11-11 NOTE — Therapy (Signed)
OUTPATIENT PHYSICAL THERAPY THORACOLUMBAR TREATMENT       Patient Name: Olivia Shah MRN: [id number] DOB:Apr 23, 1956, 67 y.o., female Today's Date: 11/11/2022   PT End of Session - 11/11/22 1659     Visit Number 21    Date for PT Re-Evaluation 11/19/22    PT Start Time 1700    PT Stop Time 1745    PT Time Calculation (min) 45 min                Past Medical History:  Diagnosis Date   Arthritis    Diabetes mellitus without complication ([place])    Hypertension    Past Surgical History:  Procedure Laterality Date   APPLICATION OF CRANIAL NAVIGATION N/A 05/18/2021   Procedure: APPLICATION OF CRANIAL NAVIGATION;  Surgeon: Judith Part, MD;  Location: [facility];  Service: Neurosurgery;  Laterality: N/A;   BREAST BIOPSY Right    CHOLECYSTECTOMY     CRANIOTOMY Right 05/18/2021   Procedure: Right craniotomy for tumor resection;  Surgeon: Judith Part, MD;  Location: [facility];  Service: Neurosurgery;  Laterality: Right;   EYE SURGERY     as a child   HERNIA REPAIR     Patient Active Problem List   Diagnosis Date Noted   Abnormal findings on diagnostic imaging of other specified body structures 01/14/2022   Chronic pain 01/14/2022   Colon cancer screening 01/14/2022   Dysphagia 01/14/2022   Family history of malignant neoplasm of digestive organs 01/14/2022   Hyperglycemia due to type 2 diabetes mellitus ([place]) 01/14/2022   Hypertension 01/14/2022   Meralgia paresthetica 01/14/2022   Mixed hyperlipidemia 01/14/2022   Morbid obesity ([place]) 01/14/2022   Overweight 01/14/2022   Pure hypercholesterolemia 01/14/2022   Spinal cord disease ([place]) 01/14/2022   Chronic cough 12/01/2021   Diabetes ([place]) 07/08/2021   Glaucoma 07/08/2021   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   Visual field defect of left eye 07/08/2021   Debility 06/16/2021   Hypokalemia    Seizures ([facility])    Benign essential HTN    Controlled type 2 diabetes mellitus with hyperglycemia, without long-term  current use of insulin ([place])    Stridor 06/05/2021   Status post tracheostomy ([place])    Acute respiratory failure with hypoxia ([place])    Status epilepticus ([place])    Brain tumor ([place]) 05/18/2021    PCP: Seward Carol, MD   REFERRING PROVIDER: Seward Carol, MD   REFERRING DIAG: Diagnosis M48.061 (ICD-10-CM) - Spinal stenosis, lumbar region without neurogenic claudication   Rationale for Evaluation and Treatment: Rehabilitation  THERAPY DIAG:  Muscle weakness (generalized)  Difficulty in walking, not elsewhere classified  Other low back pain  ONSET DATE: 07/06/2022   SUBJECTIVE:  SUBJECTIVE STATEMENT:  stretching helped no pain until yesterday- missed Monday friend in hospital   PERTINENT HISTORY:  Meningioma, removed, R BG infarct, old  PAIN:  Are you having pain? Yes 4/10 posterior legs; 2/10 in back  PRECAUTIONS: None  WEIGHT BEARING RESTRICTIONS: No  FALLS:  Has patient fallen in last 6 months? No  LIVING ENVIRONMENT: Lives with: lives alone Lives in: House/apartment Stairs: Yes: External: 2 steps; on right going up Has following equipment at home: Single point cane and Walker - 2 wheeled  OCCUPATION: Receptionist, sitting, full time  PLOF: Independent  PATIENT GOALS: Decrease pain, be able to move around without pain   OBJECTIVE:   DIAGNOSTIC FINDINGS:  Lumbar spinal stenosis  PATIENT SURVEYS:  FOTO 40.26  SCREENING FOR RED FLAGS: Bowel or bladder incontinence: No Spinal tumors: No Cauda equina syndrome: No Compression fracture: No Abdominal aneurysm: No  COGNITION: Overall cognitive status: Within functional limits for tasks assessed    MUSCLE LENGTH: Hamstrings: Right 82 deg; Left 84 deg B hip flexors appear tight  POSTURE: increased lumbar lordosis,  decreased thoracic kyphosis, right pelvic obliquity, and R shoulder elevated RLE longer than L 3/4"  PALPATION: N TTP in back or buttocks or legs  LUMBAR ROM:   AROM eval 09/02/22 09/13/22  Flexion WNL WNLs WFL  Extension Severely limited Decreased 90% Decreased 50%  Right lateral flexion WNL WFLs WFL  Left lateral flexion 3" above knee WFLS WFL  Right rotation 30% WFLs WFL  Left rotation 40% WFLs WFL   (Blank rows = not tested)  LOWER EXTREMITY ROM:    WNL B  LOWER EXTREMITY MMT:    MMT Right eval Left eval RT/Left 09/02/22 RT/Left 10/07/22 Rt/Lt 10/18/22  Hip flexion 3+ 3+ 4/4 4/4 4/4  Hip extension 3- 3- 4/4 4+/4+ 3+/3+   Hip abduction 3 3 4/4 4+/4+ 3+/3+ without compensation in sidelying  Hip adduction       Hip internal rotation       Hip external rotation       Knee flexion 4- 4-  4+/4+ 5/5  Knee extension 3+ 3+  4/4 4+/4+  Ankle dorsiflexion 4 4  4/4 4+/4+  Ankle plantarflexion       Ankle inversion       Ankle eversion        (Blank rows = not tested)  LUMBAR SPECIAL TESTS:  Straight leg raise test: Negative and Slump test: Negative  FUNCTIONAL TESTS:  5 times sit to stand: 19.26    09/02/22 12.3 sec 10/18/22 13.9 sec Timed up and go (TUG): 14.50   09/02/22 no AD 8.1 sec Functional gait assessment: TBD  10/18/22 15/30    GAIT: Distance walked: at least 2 laps around gym  Assistive device utilized: Single point cane and it was set too tall, but she declined to lower it. Level of assistance: Modified independence Comments: Patient declined to adjust cane height.  TODAY'S TREATMENT:  DATE:  11/11/22 Nustep L 6 24mn 3 way plyo ball rolls outs 10 each Feet on ball bridge, KTC and obl 15 x each Iso abdominal 15 x Resisted trunk rotation 15 x Clams and hip flexion tband 2 sets 10 Black tband trunk flex and ext 2 sets 10 Seated row  and lats 2 sets 10 PROM LE and trunk   11/04/22 Nustep L 5  749m B Ue's and LE's Modified sit ups 10 x STS with wt ball chest press 10 x Standing wt ball trunk rotation 12 x each Seated wt ball diagonals 10 each way 3 way plyoball roll outs 10 each way Supine clams with green tband and ppt 2 sets 10 Supine hip flexion green tband and ppt 2 sets 10 Feet on ball bridge, obl and KTC Iso abdominals upper and lower with ball PROM LE and trunk     Nustep L 5  53m34mB Ue's and LE's Black bar plantarflexor stretch 30 sec each side, 3 sets  Black bar DF and PF 20 x Feet on ball bridge 2 sets 10, obl 20, KTC 10x Supine ppt 10 x hold 3 sec Supine isometric knee to chest 5 sec holds, 10 x each side Supine clams with green tband and ppt 2 sets 10 Supine hip flexion green tband and ppt 2 sets 10 Sidelying B for hip abduction with leg straight, therapist lightly supported leg to assist with eccentric lowering.  2 sets each 12 reps  PROM and stretches to back and LE with neural tension glides Seated tailor's stretch 30 sec hold 3 reps each leg   2/122-Feb-2024step L 4 53mi22mMod dead lift without wt 10x Black bar DF and PF 20 x Feet on ball bridge 2 sets 10, obl 20, KTC 10x Isometric abdominals 15 x hold 3sec Supine ppt 15 x hold 3 sec Supine clams with green tband and ppt 2 sets 10 Supine hip flexion green tband and ppt 2 sets 10 PROM and stretches to back and LE with neural tension glides  10/25/22 NuStep L3 x 6 minutes Supine over Blue physioball- bridge x 10, repeat with hip IR, B KTC , but reported pain moving into the backs of her legs, bridge with side to side roll- 10 each Supine SLR x 10 each leg, clamshells,  Supine stretch B KTC, LTR, ITB stretch Side lying hip abd, 10 reps each side Seated HS stretch, 3 x 15 seconds each leg  10/21/22 Nustep L 5 6min73m only Resisted gait 30 # 4 way 5 x each TM ( with UE support) 4 min- working on increased stride and lifter steps .7 mph HS  curl 25# 2 sets 10 Knee ext 10# 3 sets 5 ( last week unable to do 10#) Standing red tband hip flex,ext and abd 10 x each ( pulling sensation in post leg with extension stopped when mvmt stopped, abd weak) Step ups 6 inch 10 x fwd, 10 x laterally with UE support- left leg weaker with step ups STS with wt ball press 2 sets 10  10/18/22  Nustep L5 x 5 min Hamstring stretch x30 sec R&L Figure 4 stretch 2x30 sec R&L Bridging x10 Forward tap 4" step 2x10 Side tap 4" step 2x10 R&L See assessments above  10/14/22 Nustep L 5 6min 55m 3 laps without AD 1 min 46 sec Obstacle course stepping on and over object- CG-min A with HHA needed for 6 inch curb and large roll Obstacle course step over step  Various 6 inch step ups ex- 2 HHA then 1 HHA Resisted gait 30 # 4 way 4 x each Seated row and lat pull down 20# 2 sets 12 Black tband trunk ext 2 sets 10  10/07/22 Nustep L 5 6 min MMT assessed STS wt ball chest press 10 x Standing wt ball rotation 10 each and 10 x OH press HS curl 20# 2 sets 10 Knee ext 5# 2 sets 10 ( 10# was too heavy) Seated row and lat pull down 20# 2 sets 12 Black tband trunk ext 2 sets 10 Amb 3 laps without AD about 350 feet  1 min 42 sec Forward and lateral 4 in step ups x 10 each bilaterally HHA x 2    10/04/22 NuStep L4 x 6 min S2S holding yellow ball 2x10  Forward and lateral 4 in step ups x 10 each bilaterally HHA x 2  HS curls green 2x12 LAQ 2lb 2x12 each  Standing march 2lb 2x10 each Seated row and lats 2 sets 12 20#   09/16/22 Bike L 4 2 min then decreased L 2 4 min STS on airex, slight elevated mat 10x with CGA- some instability HHA on airex 3# ankle wts 10 x SL then 10 x alt Marching,hip flex SL ,hip abd and hip ext HHA marching fwd and back 10 feet 2 x, laterally 10 feet 2 x Standing HS curl 3# 2 sets 10 Wt ball trunk ext,rotation,chest press and OH 10 x each Amb 360 feet without AD SBA 2 min 30 sec Seated row and lats 2 sets 12 20# Hamstring Curls  20lb 2x12  Leg Ext 5lb 2x12.    09/13/22 NuStep L5 x5 min Foto Gait W/ SPC 360 feet reports some stinging int he back of her legs Hamstring Curls 20lb 2x12  Leg Ext 5lb 2x12. S2S holding red ball 2x10   09/06/22 NuStep L 5 x 6 min  Hamstring Curls 20lb 3x10  Leg Ext 5lb 3x10 Shoulder Ext 5lb 2x10  Standing rows 10lb 2x10 Sit to stand 2x10  Supine bridges 2x10 PROM/stretching LE and trunk   PATIENT EDUCATION:  Education details: POC Person educated: Patient Education method: Explanation Education comprehension: verbalized understanding  HOME EXERCISE PROGRAM:  [id number]  ASSESSMENT:  CLINICAL IMPRESSION: patient is attending skilled physical therapy to address B lower leg pain and dysfunction related to spinal stenosis.  She reports improvement since last Rx, over the weekend.  follow up appt  referring MD in [facility]. Feels PT ex and stretching are helping.  She does demonstrate lurching gait with trendelenberg B, greater L than R.  Continued with stability training for Ls region and additional hip strengthening.  Goals assessed   OBJECTIVE IMPAIRMENTS: Abnormal gait, decreased activity tolerance, decreased balance, decreased coordination, decreased endurance, decreased mobility, difficulty walking, decreased ROM, decreased strength, increased muscle spasms, impaired flexibility, improper body mechanics, postural dysfunction, and pain.   ACTIVITY LIMITATIONS: carrying, lifting, bending, standing, squatting, sleeping, stairs, transfers, and locomotion level  PARTICIPATION LIMITATIONS: meal prep, cleaning, laundry, interpersonal relationship, driving, shopping, and occupation  PERSONAL FACTORS: Age, Past/current experiences, and 1 comorbidity: Meningioma  are also affecting patient's functional outcome.   REHAB POTENTIAL: Good  CLINICAL DECISION MAKING: Evolving/moderate complexity  EVALUATION COMPLEXITY: Moderate   GOALS: Goals reviewed with patient? Yes  SHORT TERM  GOALS: Target date: 08/23/2022  I with basic HEP Baseline: Goal status: met 08/26/22  PRIOR LONG TERM GOALS: Target date: 10/18/2022  I with final HEP Baseline:  Goal status:  On  going 10/07/22  10/14/22 [email or web address]  10/28/22 ongoing 11/04/22 on going  11/12/22 evolving  2.  Increase FOTO score to at least 52 Baseline: 40 Goal status: Met  09/13/22  3.  Decrease TUG14.5 time to < 12 Baseline:  Goal status: 08/26/22 with SPC 9.58 sec MET  4.  Decrease 5 x STS to < 12 sec Baseline: 19 Goal status: 08/26/22 12.47sec progressing. 09/02/22 MET  5.  Patient will walk x at least 300' with LRAD, MI, no pain in legs Baseline:  Goal status: On going 09/13/22. 10/07/22 on going 10/14/22 distance met but pain 10/28/22 pain is an issue  Distance met but pain 11/04/22 11/12/22 on going  6.  Assess and update sitting posture in her workspace to provide adequate trunk support. Baseline:  Goal status: Progressing 09/13/22, progressing 10/07/22 MET 10/14/22  REVISED LONG TERM GOALS: Target date: 12/13/2022  I with final HEP for return to community wellness/gym (per pt goals) Baseline:  Goal status:  On going 10/07/22  10/14/22 evolving 11/04/22 and 11/12/22  2.  Patient will walk x at least 500' with LRAD, MI, no pain in legs Baseline: Had been able to do 300' with LRAD but had pain in posterior legs Goal status: Revised goal 11/04/22 On going  3.  Pt will demo at least 4+/5 hip abd in sidelying for improved standing stability Baseline:  Goal status: Revised goal  ongoing 10/28/22 Progressing 11/04/22 and 11/12/22  4.  Pt will have improved FGA to >/=22/30 to demo decreased fall risk with amb Baseline:  Goal status: New  PLAN:  PT FREQUENCY: 1-2x/week  PT DURATION: 8 weeks  PLANNED INTERVENTIONS: Therapeutic exercises, Therapeutic activity, Neuromuscular re-education, Balance training, Gait training, Patient/Family education, Self Care, Joint mobilization, Stair training, Dry Needling, Electrical stimulation,  Cryotherapy, Moist heat, Ionotophoresis 7m/ml Dexamethasone, and Manual therapy.  PLAN FOR NEXT SESSION: progress with core hip stability.  Shaiann Mcmanamon,ANGIE, PTA 11/11/2022, 5:00 PM [facility] [place] [address]Phone: [phone]  Fax:  [address] [address]Phone: [phone]  Fax:  [phone] Patient Details  Name: JKOMAL FAILINGMRN: [id number] of Birth: [birth year]Referring Provider:  PSeward Carol MD  Encounter Date: 11/11/2022   PLaqueta Carina PTA 11/11/2022, 5:00 PM  [place] [address]Phone: [phone]  Fax:  [address] [address]Phone: [phone]  Fax:  [phone] Patient Details  Name: JERYCA OVERFIELDMRN: [id number] of Birth: [birth year]Referring Provider:  PSeward Carol MD  Encounter Date: 11/11/2022   PLaqueta Carina PTA 11/11/2022, 5:00 PM  [place] [address]Phone: [phone]  Fax:  [address] [address]Phone: [phone]  Fax:  [phone] Patient Details  Name: JLAMEES LYNAMMRN: [id number] of Birth: [birth year]Referring Provider:  PSeward Carol MD  Encounter Date: 11/11/2022   PLaqueta Carina PTA 11/11/2022, 5:00 PM  [place] [address]Phone: [phone]  Fax:  [address] [place]  [place] [place], [place], [address] Phone: [phone]   Fax:  [phone]  Patient Details  Name: EVANJELINA OCKERMAN MRN: [id number] Date of Birth: 12-Oct-1955 Referring Provider:  Seward Carol, MD  Encounter Date: 11/11/2022   Laqueta Carina, PTA 11/11/2022, 5:00 PM  [facility] [facility] [place]. [place], [place], [address] Phone: [phone]   Fax:  [phone]

## 2022-11-15 ENCOUNTER — Ambulatory Visit: Payer: Medicare Other

## 2022-11-15 DIAGNOSIS — R278 Other lack of coordination: Secondary | ICD-10-CM

## 2022-11-15 DIAGNOSIS — M6281 Muscle weakness (generalized): Secondary | ICD-10-CM | POA: Diagnosis not present

## 2022-11-15 DIAGNOSIS — R252 Cramp and spasm: Secondary | ICD-10-CM

## 2022-11-15 DIAGNOSIS — M5459 Other low back pain: Secondary | ICD-10-CM

## 2022-11-15 DIAGNOSIS — R2681 Unsteadiness on feet: Secondary | ICD-10-CM

## 2022-11-15 DIAGNOSIS — G8929 Other chronic pain: Secondary | ICD-10-CM

## 2022-11-15 DIAGNOSIS — R262 Difficulty in walking, not elsewhere classified: Secondary | ICD-10-CM

## 2022-11-15 NOTE — Therapy (Signed)
OUTPATIENT PHYSICAL THERAPY THORACOLUMBAR TREATMENT       Patient Name: Olivia Shah MRN: [id number] DOB:[birth year], 67 y.o., female Today's Date: 11/15/2022   PT End of Session - 11/15/22 1058     Visit Number 22    Date for PT Re-Evaluation 11/19/22    PT Start Time 1100    PT Stop Time 1145    PT Time Calculation (min) 45 min    Activity Tolerance Patient tolerated treatment well    Behavior During Therapy [facility] for tasks assessed/performed                Past Medical History:  Diagnosis Date   Arthritis    Diabetes mellitus without complication ([place])    Hypertension    Past Surgical History:  Procedure Laterality Date   APPLICATION OF CRANIAL NAVIGATION N/A 05/18/2021   Procedure: APPLICATION OF CRANIAL NAVIGATION;  Surgeon: Judith Part, MD;  Location: [place];  Service: Neurosurgery;  Laterality: N/A;   BREAST BIOPSY Right    CHOLECYSTECTOMY     CRANIOTOMY Right 05/18/2021   Procedure: Right craniotomy for tumor resection;  Surgeon: Judith Part, MD;  Location: [place];  Service: Neurosurgery;  Laterality: Right;   EYE SURGERY     as a child   HERNIA REPAIR     Patient Active Problem List   Diagnosis Date Noted   Abnormal findings on diagnostic imaging of other specified body structures 01/14/2022   Chronic pain 01/14/2022   Colon cancer screening 01/14/2022   Dysphagia 01/14/2022   Family history of malignant neoplasm of digestive organs 01/14/2022   Hyperglycemia due to type 2 diabetes mellitus ([place]) 01/14/2022   Hypertension 01/14/2022   Meralgia paresthetica 01/14/2022   Mixed hyperlipidemia 01/14/2022   Morbid obesity ([place]) 01/14/2022   Overweight 01/14/2022   Pure hypercholesterolemia 01/14/2022   Spinal cord disease ([place]) 01/14/2022   Chronic cough 12/01/2021   Diabetes ([place]) 07/08/2021   Glaucoma 07/08/2021   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   Visual field defect of left eye 07/08/2021   Debility 06/16/2021   Hypokalemia     Seizures (HCC)    Benign essential HTN    Controlled type 2 diabetes mellitus with hyperglycemia, without long-term current use of insulin ([place])    Stridor 06/05/2021   Status post tracheostomy ([place])    Acute respiratory failure with hypoxia ([place])    Status epilepticus ([place])    Brain tumor ([place]) 05/18/2021    PCP: Seward Carol, MD   REFERRING PROVIDER: Seward Carol, MD   REFERRING DIAG: Diagnosis M48.061 (ICD-10-CM) - Spinal stenosis, lumbar region without neurogenic claudication   Rationale for Evaluation and Treatment: Rehabilitation  THERAPY DIAG:  Muscle weakness (generalized)  Difficulty in walking, not elsewhere classified  Other low back pain  Unsteadiness on feet  Other lack of coordination  Chronic bilateral low back pain with bilateral sciatica  Cramp and spasm  ONSET DATE: 07/06/2022   SUBJECTIVE:  SUBJECTIVE STATEMENT:  overall I still feel better, no pain in my back.  I do think I am able to walk farther before I have pain PERTINENT HISTORY:  Meningioma, removed, R BG infarct, old  PAIN:  Are you having pain? Yes 2/10 posterior legs; 0/10 in back  PRECAUTIONS: None  WEIGHT BEARING RESTRICTIONS: No  FALLS:  Has patient fallen in last 6 months? No  LIVING ENVIRONMENT: Lives with: lives alone Lives in: House/apartment Stairs: Yes: External: 2 steps; on right going up Has following equipment at home: Single point cane and Walker - 2 wheeled  OCCUPATION: Receptionist, sitting, full time  PLOF: Independent  PATIENT GOALS: Decrease pain, be able to move around without pain   OBJECTIVE:   DIAGNOSTIC FINDINGS:  Lumbar spinal stenosis  PATIENT SURVEYS:  FOTO 40.26  SCREENING FOR RED FLAGS: Bowel or bladder incontinence: No Spinal tumors: No Cauda equina  syndrome: No Compression fracture: No Abdominal aneurysm: No  COGNITION: Overall cognitive status: Within functional limits for tasks assessed    MUSCLE LENGTH: Hamstrings: Right 82 deg; Left 84 deg B hip flexors appear tight  POSTURE: increased lumbar lordosis, decreased thoracic kyphosis, right pelvic obliquity, and R shoulder elevated RLE longer than L 3/4"  PALPATION: N TTP in back or buttocks or legs  LUMBAR ROM:   AROM eval 09/02/22 09/13/22  Flexion WNL WNLs WFL  Extension Severely limited Decreased 90% Decreased 50%  Right lateral flexion WNL WFLs WFL  Left lateral flexion 3" above knee WFLS WFL  Right rotation 30% WFLs WFL  Left rotation 40% WFLs WFL   (Blank rows = not tested)  LOWER EXTREMITY ROM:    WNL B  LOWER EXTREMITY MMT:    MMT Right eval Left eval RT/Left 09/02/22 RT/Left 10/07/22 Rt/Lt 10/18/22  Hip flexion 3+ 3+ 4/4 4/4 4/4  Hip extension 3- 3- 4/4 4+/4+ 3+/3+   Hip abduction 3 3 4/4 4+/4+ 3+/3+ without compensation in sidelying  Hip adduction       Hip internal rotation       Hip external rotation       Knee flexion 4- 4-  4+/4+ 5/5  Knee extension 3+ 3+  4/4 4+/4+  Ankle dorsiflexion 4 4  4/4 4+/4+  Ankle plantarflexion       Ankle inversion       Ankle eversion        (Blank rows = not tested)  LUMBAR SPECIAL TESTS:  Straight leg raise test: Negative and Slump test: Negative  FUNCTIONAL TESTS:  5 times sit to stand: 19.26    09/02/22 12.3 sec 10/18/22 13.9 sec Timed up and go (TUG): 14.50   09/02/22 no AD 8.1 sec Functional gait assessment: TBD  10/18/22 15/30    GAIT: Distance walked: at least 2 laps around gym  Assistive device utilized: Single point cane and it was set too tall, but she declined to lower it. Level of assistance: Modified independence Comments: Patient declined to adjust cane height.  TODAY'S TREATMENT:  DATE: 11/15/22:  Nustep L 6 665mn 3 way plyo ball rolls outs 10 each with large blue ball  Feet on ball bridge, KTC and obl 15 x each Iso abdominal 15 x Resisted trunk rotation 15 x Clams and hip flexion tband 2 sets 10 in supine hook lying Seated pelvic tilt with marching Black tband trunk flex and ext 2 sets 10 Seated row and lats 2 sets 10 x with green theraband PROM LE and trunk in supine, therapist providing gentle overpressure and prolonged stretch  11/11/22 Nustep L 6 735m 3 way plyo ball rolls outs 10 each Feet on ball bridge, KTC and obl 15 x each Iso abdominal 15 x Resisted trunk rotation 15 x Clams and hip flexion tband 2 sets 10 Black tband trunk flex and ext 2 sets 10 Seated row and lats 2 sets 10 PROM LE and trunk   11/04/22 Nustep L 5  65m31mB Ue's and LE's Modified sit ups 10 x STS with wt ball chest press 10 x Standing wt ball trunk rotation 12 x each Seated wt ball diagonals 10 each way 3 way plyoball roll outs 10 each way Supine clams with green tband and ppt 2 sets 10 Supine hip flexion green tband and ppt 2 sets 10 Feet on ball bridge, obl and KTC Iso abdominals upper and lower with ball PROM LE and trunk     Nustep L 5  65mi58m Ue's and LE's Black bar plantarflexor stretch 30 sec each side, 3 sets  Black bar DF and PF 20 x Feet on ball bridge 2 sets 10, obl 20, KTC 10x Supine ppt 10 x hold 3 sec Supine isometric knee to chest 5 sec holds, 10 x each side Supine clams with green tband and ppt 2 sets 10 Supine hip flexion green tband and ppt 2 sets 10 Sidelying B for hip abduction with leg straight, therapist lightly supported leg to assist with eccentric lowering.  2 sets each 12 reps  PROM and stretches to back and LE with neural tension glides Seated tailor's stretch 30 sec hold 3 reps each leg   2/1/February 05, 2024tep L 4 65min63mod dead lift without wt 10x Black bar DF and PF 20 x Feet on ball bridge 2 sets 10, obl 20, KTC  10x Isometric abdominals 15 x hold 3sec Supine ppt 15 x hold 3 sec Supine clams with green tband and ppt 2 sets 10 Supine hip flexion green tband and ppt 2 sets 10 PROM and stretches to back and LE with neural tension glides  10/25/22 NuStep L3 x 6 minutes Supine over Blue physioball- bridge x 10, repeat with hip IR, B KTC , but reported pain moving into the backs of her legs, bridge with side to side roll- 10 each Supine SLR x 10 each leg, clamshells,  Supine stretch B KTC, LTR, ITB stretch Side lying hip abd, 10 reps each side Seated HS stretch, 3 x 15 seconds each leg  10/21/22 Nustep L 5 6min 41monly Resisted gait 30 # 4 way 5 x each TM ( with UE support) 4 min- working on increased stride and lifter steps .7 mph HS curl 25# 2 sets 10 Knee ext 10# 3 sets 5 ( last week unable to do 10#) Standing red tband hip flex,ext and abd 10 x each ( pulling sensation in post leg with extension stopped when mvmt stopped, abd weak) Step ups 6 inch 10 x fwd, 10 x laterally with UE support- left  leg weaker with step ups STS with wt ball press 2 sets 10  10/18/22  Nustep L5 x 5 min Hamstring stretch x30 sec R&L Figure 4 stretch 2x30 sec R&L Bridging x10 Forward tap 4" step 2x10 Side tap 4" step 2x10 R&L See assessments above  10/14/22 Nustep L 5 14mn Amb 3 laps without AD 1 min 46 sec Obstacle course stepping on and over object- CG-min A with HHA needed for 6 inch curb and large roll Obstacle course step over step Various 6 inch step ups ex- 2 HHA then 1 HHA Resisted gait 30 # 4 way 4 x each Seated row and lat pull down 20# 2 sets 12 Black tband trunk ext 2 sets 10  10/07/22 Nustep L 5 6 min MMT assessed STS wt ball chest press 10 x Standing wt ball rotation 10 each and 10 x OH press HS curl 20# 2 sets 10 Knee ext 5# 2 sets 10 ( 10# was too heavy) Seated row and lat pull down 20# 2 sets 12 Black tband trunk ext 2 sets 10 Amb 3 laps without AD about 350 feet  1 min 42 sec Forward  and lateral 4 in step ups x 10 each bilaterally HHA x 2    10/04/22 NuStep L4 x 6 min S2S holding yellow ball 2x10  Forward and lateral 4 in step ups x 10 each bilaterally HHA x 2  HS curls green 2x12 LAQ 2lb 2x12 each  Standing march 2lb 2x10 each Seated row and lats 2 sets 12 20#   09/16/22 Bike L 4 2 min then decreased L 2 4 min STS on airex, slight elevated mat 10x with CGA- some instability HHA on airex 3# ankle wts 10 x SL then 10 x alt Marching,hip flex SL ,hip abd and hip ext HHA marching fwd and back 10 feet 2 x, laterally 10 feet 2 x Standing HS curl 3# 2 sets 10 Wt ball trunk ext,rotation,chest press and OH 10 x each Amb 360 feet without AD SBA 2 min 30 sec Seated row and lats 2 sets 12 20# Hamstring Curls 20lb 2x12  Leg Ext 5lb 2x12.    09/13/22 NuStep L5 x5 min Foto Gait W/ SPC 360 feet reports some stinging int he back of her legs Hamstring Curls 20lb 2x12  Leg Ext 5lb 2x12. S2S holding red ball 2x10   09/06/22 NuStep L 5 x 6 min  Hamstring Curls 20lb 3x10  Leg Ext 5lb 3x10 Shoulder Ext 5lb 2x10  Standing rows 10lb 2x10 Sit to stand 2x10  Supine bridges 2x10 PROM/stretching LE and trunk   PATIENT EDUCATION:  Education details: POC Person educated: Patient Education method: Explanation Education comprehension: verbalized understanding  HOME EXERCISE PROGRAM:  [id number] ASSESSMENT:  CLINICAL IMPRESSION: patient is attending skilled physical therapy to address B lower leg pain and dysfunction related to spinal stenosis.  She does report overall improvement, also able to walk longer which was quite restricted prior to initiation of PT She has poor recall of prior sessions/ exercises, may have some difficulty following up at home. Due for reassessment next visit   OBJECTIVE IMPAIRMENTS: Abnormal gait, decreased activity tolerance, decreased balance, decreased coordination, decreased endurance, decreased mobility, difficulty walking, decreased ROM,  decreased strength, increased muscle spasms, impaired flexibility, improper body mechanics, postural dysfunction, and pain.   ACTIVITY LIMITATIONS: carrying, lifting, bending, standing, squatting, sleeping, stairs, transfers, and locomotion level  PARTICIPATION LIMITATIONS: meal prep, cleaning, laundry, interpersonal relationship, driving, shopping, and occupation  PERSONAL  FACTORS: Age, Past/current experiences, and 1 comorbidity: Meningioma  are also affecting patient's functional outcome.   REHAB POTENTIAL: Good  CLINICAL DECISION MAKING: Evolving/moderate complexity  EVALUATION COMPLEXITY: Moderate   GOALS: Goals reviewed with patient? Yes  SHORT TERM GOALS: Target date: 08/23/2022  I with basic HEP Baseline: Goal status: met 08/26/22  PRIOR LONG TERM GOALS: Target date: 10/18/2022  I with final HEP Baseline:  Goal status:  On going 10/07/22  10/14/22 evolvimg  10/28/22 ongoing 11/04/22 on going  11/12/22 evolving  2.  Increase FOTO score to at least 52 Baseline: 40 Goal status: Met  09/13/22  3.  Decrease TUG14.5 time to < 12 Baseline:  Goal status: 08/26/22 with SPC 9.58 sec MET  4.  Decrease 5 x STS to < 12 sec Baseline: 19 Goal status: 08/26/22 12.47sec progressing. 09/02/22 MET  5.  Patient will walk x at least 300' with LRAD, MI, no pain in legs Baseline:  Goal status: On going 09/13/22. 10/07/22 on going 10/14/22 distance met but pain 10/28/22 pain is an issue  Distance met but pain 11/04/22 11/12/22 on going  6.  Assess and update sitting posture in her workspace to provide adequate trunk support. Baseline:  Goal status: Progressing 09/13/22, progressing 10/07/22 MET 10/14/22  REVISED LONG TERM GOALS: Target date: 12/13/2022  I with final HEP for return to community wellness/gym (per pt goals) Baseline:  Goal status:  On going 10/07/22  10/14/22 evolving 11/04/22 and 11/12/22  2.  Patient will walk x at least 500' with LRAD, MI, no pain in legs Baseline: Had been able to  do 300' with LRAD but had pain in posterior legs Goal status: Revised goal 11/04/22 On going  3.  Pt will demo at least 4+/5 hip abd in sidelying for improved standing stability Baseline:  Goal status: Revised goal  ongoing 10/28/22 Progressing 11/04/22 and 11/12/22  4.  Pt will have improved FGA to >/=22/30 to demo decreased fall risk with amb Baseline:  Goal status: New  PLAN:  PT FREQUENCY: 1-2x/week  PT DURATION: 8 weeks  PLANNED INTERVENTIONS: Therapeutic exercises, Therapeutic activity, Neuromuscular re-education, Balance training, Gait training, Patient/Family education, Self Care, Joint mobilization, Stair training, Dry Needling, Electrical stimulation, Cryotherapy, Moist heat, Ionotophoresis 79m/ml Dexamethasone, and Manual therapy.  PLAN FOR NEXT SESSION: progress with core hip stability.  ATim Lair PT 11/15/2022, 12:22 PM [facility] [place] [address]Phone: [phone]  Fax:  [address] [address]Phone: [phone]  Fax:  [phone] Patient Details  Name: JDONNABELLE RUPLINGERMRN: [id number] of Birth: 7Nov 28, 1957Referring Provider:  PSeward Carol MD  Encounter Date: 11/15/2022   ATim Lair PT 11/15/2022, 12:22 PM  [place] [address]Phone: [phone]  Fax:  [address] [address]Phone: [phone]  Fax:  [phone] Patient Details  Name: JDEJAE DEWINDTMRN: [id number] of Birth: [birth year]Referring Provider:  PSeward Carol MD  Encounter Date: 11/15/2022   ATim Lair PT 11/15/2022, 12:22 PM  [place] [address]Phone: [phone]  Fax:  [address]  [place] [place], [place], [address] Phone: [phone]   Fax:  [phone]  Patient Details  Name: SHAVONNA ELDREDGE MRN: [id number] Date of Birth: May 24, 1956 Referring Provider:  Seward Carol, MD  Encounter Date: 11/15/2022   Tim Lair, PT 11/15/2022, 12:22 PM  [place] at [place]. [place], [place], [address] Phone: [phone]   Fax:  [place] at [place]. [place], [place], [address] Phone: [phone]   Fax:  [phone]  Patient Details  Name: HUE COCKERILL MRN: [id number] Date of Birth: 1956-08-05 Referring Provider:  Seward Carol, MD  Encounter Date: 11/15/2022   Tim Lair, PT 11/15/2022, 12:22 PM  [place] at [place]. [place], [place], [address] Phone: [phone]   Fax:  [phone]

## 2022-11-25 ENCOUNTER — Ambulatory Visit: Payer: Medicare Other | Admitting: Physical Therapy

## 2022-11-25 DIAGNOSIS — M6281 Muscle weakness (generalized): Secondary | ICD-10-CM

## 2022-11-25 DIAGNOSIS — R2681 Unsteadiness on feet: Secondary | ICD-10-CM

## 2022-11-25 DIAGNOSIS — R262 Difficulty in walking, not elsewhere classified: Secondary | ICD-10-CM

## 2022-11-25 DIAGNOSIS — M5459 Other low back pain: Secondary | ICD-10-CM

## 2022-11-25 NOTE — Therapy (Signed)
OUTPATIENT PHYSICAL THERAPY THORACOLUMBAR TREATMENT       Patient Name: Olivia Shah MRN: [id number] DOB:January 03, 1956, 67 y.o., female Today's Date: 11/25/2022   PT End of Session - 11/25/22 1656     Visit Number 23    Date for PT Re-Evaluation 12/24/22    PT Start Time 1455    PT Stop Time 1545    PT Time Calculation (min) 50 min                Past Medical History:  Diagnosis Date   Arthritis    Diabetes mellitus without complication ([place])    Hypertension    Past Surgical History:  Procedure Laterality Date   APPLICATION OF CRANIAL NAVIGATION N/A 05/18/2021   Procedure: APPLICATION OF CRANIAL NAVIGATION;  Surgeon: Judith Part, MD;  Location: [facility];  Service: Neurosurgery;  Laterality: N/A;   BREAST BIOPSY Right    CHOLECYSTECTOMY     CRANIOTOMY Right 05/18/2021   Procedure: Right craniotomy for tumor resection;  Surgeon: Judith Part, MD;  Location: [facility];  Service: Neurosurgery;  Laterality: Right;   EYE SURGERY     as a child   HERNIA REPAIR     Patient Active Problem List   Diagnosis Date Noted   Abnormal findings on diagnostic imaging of other specified body structures 01/14/2022   Chronic pain 01/14/2022   Colon cancer screening 01/14/2022   Dysphagia 01/14/2022   Family history of malignant neoplasm of digestive organs 01/14/2022   Hyperglycemia due to type 2 diabetes mellitus ([place]) 01/14/2022   Hypertension 01/14/2022   Meralgia paresthetica 01/14/2022   Mixed hyperlipidemia 01/14/2022   Morbid obesity ([place]) 01/14/2022   Overweight 01/14/2022   Pure hypercholesterolemia 01/14/2022   Spinal cord disease ([place]) 01/14/2022   Chronic cough 12/01/2021   Diabetes ([place]) 07/08/2021   Glaucoma 07/08/2021   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   Visual field defect of left eye 07/08/2021   Debility 06/16/2021   Hypokalemia    Seizures ([facility])    Benign essential HTN    Controlled type 2 diabetes mellitus with hyperglycemia, without long-term  current use of insulin ([place])    Stridor 06/05/2021   Status post tracheostomy ([place])    Acute respiratory failure with hypoxia ([place])    Status epilepticus ([place])    Brain tumor ([place]) 05/18/2021    PCP: Seward Carol, MD   REFERRING PROVIDER: Seward Carol, MD   REFERRING DIAG: Diagnosis M48.061 (ICD-10-CM) - Spinal stenosis, lumbar region without neurogenic claudication   Rationale for Evaluation and Treatment: Rehabilitation  THERAPY DIAG:  Muscle weakness (generalized)  Difficulty in walking, not elsewhere classified  Other low back pain  Unsteadiness on feet  ONSET DATE: 07/06/2022   SUBJECTIVE:  SUBJECTIVE STATEMENT:  overall 50% better. Doing more,walking further. Would like to walk without cane and decrease pain  and increase strength. MD 11/30/22 back doc (  PERTINENT HISTORY:  Meningioma, removed, R BG infarct, old  PAIN:  Are you having pain? Yes 2/10 posterior legs; 0/10 in back  PRECAUTIONS: None  WEIGHT BEARING RESTRICTIONS: No  FALLS:  Has patient fallen in last 6 months? No  LIVING ENVIRONMENT: Lives with: lives alone Lives in: House/apartment Stairs: Yes: External: 2 steps; on right going up Has following equipment at home: Single point cane and Walker - 2 wheeled  OCCUPATION: Receptionist, sitting, full time  PLOF: Independent  PATIENT GOALS: Decrease pain, be able to move around without pain   OBJECTIVE:   DIAGNOSTIC FINDINGS:  Lumbar spinal stenosis  PATIENT SURVEYS:  FOTO 40.26  SCREENING FOR RED FLAGS: Bowel or bladder incontinence: No Spinal tumors: No Cauda equina syndrome: No Compression fracture: No Abdominal aneurysm: No  COGNITION: Overall cognitive status: Within functional limits for tasks assessed    MUSCLE LENGTH: Hamstrings: Right  82 deg; Left 84 deg B hip flexors appear tight  POSTURE: increased lumbar lordosis, decreased thoracic kyphosis, right pelvic obliquity, and R shoulder elevated RLE longer than L 3/4"  PALPATION: N TTP in back or buttocks or legs  LUMBAR ROM:   AROM eval 09/02/22 09/13/22  Flexion WNL WNLs WFL  Extension Severely limited Decreased 90% Decreased 50%  Right lateral flexion WNL WFLs WFL  Left lateral flexion 3" above knee WFLS WFL  Right rotation 30% WFLs WFL  Left rotation 40% WFLs WFL   (Blank rows = not tested)  LOWER EXTREMITY ROM:    WNL B  LOWER EXTREMITY MMT:    MMT Right eval Left eval RT/Left 09/02/22 RT/Left 10/07/22 Rt/Lt 10/18/22  Hip flexion 3+ 3+ 4/4 4/4 4/4  Hip extension 3- 3- 4/4 4+/4+ 3+/3+   Hip abduction 3 3 4/4 4+/4+ 3+/3+ without compensation in sidelying  Hip adduction       Hip internal rotation       Hip external rotation       Knee flexion 4- 4-  4+/4+ 5/5  Knee extension 3+ 3+  4/4 4+/4+  Ankle dorsiflexion 4 4  4/4 4+/4+  Ankle plantarflexion       Ankle inversion       Ankle eversion        (Blank rows = not tested)  LUMBAR SPECIAL TESTS:  Straight leg raise test: Negative and Slump test: Negative  FUNCTIONAL TESTS:  5 times sit to stand: 19.26    09/02/22 12.3 sec 10/18/22 13.9 sec Timed up and go (TUG): 14.50   09/02/22 no AD 8.1 sec Functional gait assessment: TBD  10/18/22 15/30    GAIT: Distance walked: at least 2 laps around gym  Assistive device utilized: Single point cane and it was set too tall, but she declined to lower it. Level of assistance: Modified independence Comments: Patient declined to adjust cane height.  TODAY'S TREATMENT:  DATE:  11/25/22 Nustep L 6 7 min Lumbar ROM WFLS MMT LE 4+/5 except hip flex 4/5 but overall noted func fatigue with ex Black Tband trunk flex and ext 20 x Seated  Rows and Lats 25# 2 sets 10 Standing on airex marching 20 x 2 sets CGA 6 inch step up RT down RT 10 x then 10 x Left- UE assist ( noted left leg weaker than RT) CGA HHA side stepping over foam rolls on airex- min A Isometric upper abs and lower abdominals 10 x each 5# step tap 10 x BIL fwd and laterally, then alt 20 x ( left leg notably weaker) unable to do lateral with wt on HS curl 25# 2 sets 10 Knee ext 10# 2 set s10     11/15/22:  Nustep L 6 38mn 3 way plyo ball rolls outs 10 each with large blue ball  Feet on ball bridge, KTC and obl 15 x each Iso abdominal 15 x Resisted trunk rotation 15 x Clams and hip flexion tband 2 sets 10 in supine hook lying Seated pelvic tilt with marching Black tband trunk flex and ext 2 sets 10 Seated row and lats 2 sets 10 x with green theraband PROM LE and trunk in supine, therapist providing gentle overpressure and prolonged stretch  11/11/22 Nustep L 6 73m 3 way plyo ball rolls outs 10 each Feet on ball bridge, KTC and obl 15 x each Iso abdominal 15 x Resisted trunk rotation 15 x Clams and hip flexion tband 2 sets 10 Black tband trunk flex and ext 2 sets 10 Seated row and lats 2 sets 10 PROM LE and trunk   11/04/22 Nustep L 5  7m20mB Ue's and LE's Modified sit ups 10 x STS with wt ball chest press 10 x Standing wt ball trunk rotation 12 x each Seated wt ball diagonals 10 each way 3 way plyoball roll outs 10 each way Supine clams with green tband and ppt 2 sets 10 Supine hip flexion green tband and ppt 2 sets 10 Feet on ball bridge, obl and KTC Iso abdominals upper and lower with ball PROM LE and trunk     Nustep L 5  7mi55m Ue's and LE's Black bar plantarflexor stretch 30 sec each side, 3 sets  Black bar DF and PF 20 x Feet on ball bridge 2 sets 10, obl 20, KTC 10x Supine ppt 10 x hold 3 sec Supine isometric knee to chest 5 sec holds, 10 x each side Supine clams with green tband and ppt 2 sets 10 Supine hip flexion green  tband and ppt 2 sets 10 Sidelying B for hip abduction with leg straight, therapist lightly supported leg to assist with eccentric lowering.  2 sets each 12 reps  PROM and stretches to back and LE with neural tension glides Seated tailor's stretch 30 sec hold 3 reps each leg   10/28/2022/02/17tep L 4 7min74mod dead lift without wt 10x Black bar DF and PF 20 x Feet on ball bridge 2 sets 10, obl 20, KTC 10x Isometric abdominals 15 x hold 3sec Supine ppt 15 x hold 3 sec Supine clams with green tband and ppt 2 sets 10 Supine hip flexion green tband and ppt 2 sets 10 PROM and stretches to back and LE with neural tension glides  10/25/22 NuStep L3 x 6 minutes Supine over Blue physioball- bridge x 10, repeat with hip IR, B KTC , but reported pain moving into the backs  of her legs, bridge with side to side roll- 10 each Supine SLR x 10 each leg, clamshells,  Supine stretch B KTC, LTR, ITB stretch Side lying hip abd, 10 reps each side Seated HS stretch, 3 x 15 seconds each leg  10/21/22 Nustep L 5 67mn LE only Resisted gait 30 # 4 way 5 x each TM ( with UE support) 4 min- working on increased stride and lifter steps .7 mph HS curl 25# 2 sets 10 Knee ext 10# 3 sets 5 ( last week unable to do 10#) Standing red tband hip flex,ext and abd 10 x each ( pulling sensation in post leg with extension stopped when mvmt stopped, abd weak) Step ups 6 inch 10 x fwd, 10 x laterally with UE support- left leg weaker with step ups STS with wt ball press 2 sets 10  10/18/22  Nustep L5 x 5 min Hamstring stretch x30 sec R&L Figure 4 stretch 2x30 sec R&L Bridging x10 Forward tap 4" step 2x10 Side tap 4" step 2x10 R&L See assessments above  10/14/22 Nustep L 5 630m Amb 3 laps without AD 1 min 46 sec Obstacle course stepping on and over object- CG-min A with HHA needed for 6 inch curb and large roll Obstacle course step over step Various 6 inch step ups ex- 2 HHA then 1 HHA Resisted gait 30 # 4 way 4 x  each Seated row and lat pull down 20# 2 sets 12 Black tband trunk ext 2 sets 10  10/07/22 Nustep L 5 6 min MMT assessed STS wt ball chest press 10 x Standing wt ball rotation 10 each and 10 x OH press HS curl 20# 2 sets 10 Knee ext 5# 2 sets 10 ( 10# was too heavy) Seated row and lat pull down 20# 2 sets 12 Black tband trunk ext 2 sets 10 Amb 3 laps without AD about 350 feet  1 min 42 sec Forward and lateral 4 in step ups x 10 each bilaterally HHA x 2    10/04/22 NuStep L4 x 6 min S2S holding yellow ball 2x10  Forward and lateral 4 in step ups x 10 each bilaterally HHA x 2  HS curls green 2x12 LAQ 2lb 2x12 each  Standing march 2lb 2x10 each Seated row and lats 2 sets 12 20#   09/16/22 Bike L 4 2 min then decreased L 2 4 min STS on airex, slight elevated mat 10x with CGA- some instability HHA on airex 3# ankle wts 10 x SL then 10 x alt Marching,hip flex SL ,hip abd and hip ext HHA marching fwd and back 10 feet 2 x, laterally 10 feet 2 x Standing HS curl 3# 2 sets 10 Wt ball trunk ext,rotation,chest press and OH 10 x each Amb 360 feet without AD SBA 2 min 30 sec Seated row and lats 2 sets 12 20# Hamstring Curls 20lb 2x12  Leg Ext 5lb 2x12.    09/13/22 NuStep L5 x5 min Foto Gait W/ SPC 360 feet reports some stinging int he back of her legs Hamstring Curls 20lb 2x12  Leg Ext 5lb 2x12. S2S holding red ball 2x10   09/06/22 NuStep L 5 x 6 min  Hamstring Curls 20lb 3x10  Leg Ext 5lb 3x10 Shoulder Ext 5lb 2x10  Standing rows 10lb 2x10 Sit to stand 2x10  Supine bridges 2x10 PROM/stretching LE and trunk   PATIENT EDUCATION:  Education details: POC Person educated: Patient Education method: Explanation Education comprehension: verbalized understanding  HOME EXERCISE  PROGRAM:  [id number]  ASSESSMENT:  CLINICAL IMPRESSION: patient is attending skilled physical therapy to address B lower leg pain and dysfunction related to spinal stenosis.  She does report overall  improvement at 50%, also able to walk longer which was quite restricted prior to initiation of PT . Pt would like to be able to amb without SPC, decrease posterior leg pain and increase strength.. ROM, MMT and goals assessed. Pt would benefit from continued PT to address above deficits. OBJECTIVE IMPAIRMENTS: Abnormal gait, decreased activity tolerance, decreased balance, decreased coordination, decreased endurance, decreased mobility, difficulty walking, decreased ROM, decreased strength, increased muscle spasms, impaired flexibility, improper body mechanics, postural dysfunction, and pain.   ACTIVITY LIMITATIONS: carrying, lifting, bending, standing, squatting, sleeping, stairs, transfers, and locomotion level  PARTICIPATION LIMITATIONS: meal prep, cleaning, laundry, interpersonal relationship, driving, shopping, and occupation  PERSONAL FACTORS: Age, Past/current experiences, and 1 comorbidity: Meningioma  are also affecting patient's functional outcome.   REHAB POTENTIAL: Good  CLINICAL DECISION MAKING: Evolving/moderate complexity  EVALUATION COMPLEXITY: Moderate   GOALS: Goals reviewed with patient? Yes  SHORT TERM GOALS: Target date: 08/23/2022  I with basic HEP Baseline: Goal status: met 08/26/22  PRIOR LONG TERM GOALS: Target date: 10/18/2022  I with final HEP Baseline:  Goal status:  On going 10/07/22  10/14/22 [email or web address]  10/28/22 ongoing 11/04/22 on going  11/12/22 evolving 11/25/22  2.  Increase FOTO score to at least 52 Baseline: 40 Goal status: Met  09/13/22  3.  Decrease TUG14.5 time to < 12 Baseline:  Goal status: 08/26/22 with SPC 9.58 sec MET  4.  Decrease 5 x STS to < 12 sec Baseline: 19 Goal status: 08/26/22 12.47sec progressing. 09/02/22 MET  5.  Patient will walk x at least 300' with LRAD, MI, no pain in legs Baseline:  Goal status: On going 09/13/22. 10/07/22 on going 10/14/22 distance met but pain 10/28/22 pain is an issue  Distance met but pain 11/04/22 11/12/22  on going 11/25/22 progressing  6.  Assess and update sitting posture in her workspace to provide adequate trunk support. Baseline:  Goal status: Progressing 09/13/22, progressing 10/07/22 MET 10/14/22  REVISED LONG TERM GOALS: Target date: 12/13/2022  I with final HEP for return to community wellness/gym (per pt goals) Baseline:  Goal status:  On going 10/07/22  10/14/22 evolving 11/04/22 and 11/12/22. Evolving 11/25/22  2.  Patient will walk x at least 500' with LRAD, MI, no pain in legs Baseline: Had been able to do 300' with LRAD but had pain in posterior legs Goal status: Revised goal 11/04/22 On going 11/25/22  3.  Pt will demo at least 4+/5 hip abd in sidelying for improved standing stability Baseline:  Goal status: Revised goal  ongoing 10/28/22 Progressing 11/04/22 and 11/12/22. 11/25/22 MEt except hip flex 4/5 and fatigues quickly overall  4.  Pt will have improved FGA to >/=22/30 to demo decreased fall risk with amb Baseline:  Goal status: New  PLAN:  PT FREQUENCY: 1-2x/week  PT DURATION: 8 weeks  PLANNED INTERVENTIONS: Therapeutic exercises, Therapeutic activity, Neuromuscular re-education, Balance training, Gait training, Patient/Family education, Self Care, Joint mobilization, Stair training, Dry Needling, Electrical stimulation, Cryotherapy, Moist heat, Ionotophoresis '4mg'$ /ml Dexamethasone, and Manual therapy.  PLAN FOR NEXT SESSION: progress with core hip stability. Renewal Done  Laqueta Carina, PTA 11/25/2022, 4:57 PM [facility] at [place]. [place], [place], [address] Phone: [phone]   Fax:  [facility]  [address] [place], [place], [address] Phone: [phone]   Fax:  [phone]

## 2022-11-29 ENCOUNTER — Ambulatory Visit: Payer: Medicare Other

## 2022-12-02 ENCOUNTER — Ambulatory Visit: Payer: Medicare Other | Attending: Internal Medicine | Admitting: Physical Therapy

## 2022-12-02 DIAGNOSIS — G8929 Other chronic pain: Secondary | ICD-10-CM | POA: Diagnosis present

## 2022-12-02 DIAGNOSIS — M5459 Other low back pain: Secondary | ICD-10-CM | POA: Diagnosis present

## 2022-12-02 DIAGNOSIS — M6281 Muscle weakness (generalized): Secondary | ICD-10-CM | POA: Insufficient documentation

## 2022-12-02 DIAGNOSIS — R278 Other lack of coordination: Secondary | ICD-10-CM | POA: Diagnosis present

## 2022-12-02 DIAGNOSIS — M5441 Lumbago with sciatica, right side: Secondary | ICD-10-CM | POA: Diagnosis present

## 2022-12-02 DIAGNOSIS — M5442 Lumbago with sciatica, left side: Secondary | ICD-10-CM | POA: Diagnosis present

## 2022-12-02 DIAGNOSIS — R252 Cramp and spasm: Secondary | ICD-10-CM | POA: Insufficient documentation

## 2022-12-02 DIAGNOSIS — R2681 Unsteadiness on feet: Secondary | ICD-10-CM | POA: Insufficient documentation

## 2022-12-02 DIAGNOSIS — R262 Difficulty in walking, not elsewhere classified: Secondary | ICD-10-CM | POA: Insufficient documentation

## 2022-12-02 NOTE — Therapy (Deleted)
[facility] at [place]. [place], [place], [address] Phone: [phone]   Fax:  [phone]  Patient Details  Name: Olivia Shah MRN: [id number] Date of Birth: [birth year] Referring Provider:  Seward Carol, MD  Encounter Date: 12/02/2022   Laqueta Carina, PTA 12/02/2022, 4:53 PM  [facility] [place]. [place], [place], [address] Phone: [phone]   Fax:  [phone]

## 2022-12-02 NOTE — Therapy (Signed)
OUTPATIENT PHYSICAL THERAPY THORACOLUMBAR TREATMENT       Patient Name: Olivia Shah MRN: [id number] DOB:[birth year], 67 y.o., female Today's Date: 12/02/2022   PT End of Session - 12/02/22 1652     Visit Number 24    Date for PT Re-Evaluation 12/24/22    PT Start Time 1700    PT Stop Time 1745    PT Time Calculation (min) 45 min                Past Medical History:  Diagnosis Date   Arthritis    Diabetes mellitus without complication ([place])    Hypertension    Past Surgical History:  Procedure Laterality Date   APPLICATION OF CRANIAL NAVIGATION N/A 05/18/2021   Procedure: APPLICATION OF CRANIAL NAVIGATION;  Surgeon: Judith Part, MD;  Location: [facility];  Service: Neurosurgery;  Laterality: N/A;   BREAST BIOPSY Right    CHOLECYSTECTOMY     CRANIOTOMY Right 05/18/2021   Procedure: Right craniotomy for tumor resection;  Surgeon: Judith Part, MD;  Location: [facility];  Service: Neurosurgery;  Laterality: Right;   EYE SURGERY     as a child   HERNIA REPAIR     Patient Active Problem List   Diagnosis Date Noted   Abnormal findings on diagnostic imaging of other specified body structures 01/14/2022   Chronic pain 01/14/2022   Colon cancer screening 01/14/2022   Dysphagia 01/14/2022   Family history of malignant neoplasm of digestive organs 01/14/2022   Hyperglycemia due to type 2 diabetes mellitus ([place]) 01/14/2022   Hypertension 01/14/2022   Meralgia paresthetica 01/14/2022   Mixed hyperlipidemia 01/14/2022   Morbid obesity ([place]) 01/14/2022   Overweight 01/14/2022   Pure hypercholesterolemia 01/14/2022   Spinal cord disease ([place]) 01/14/2022   Chronic cough 12/01/2021   Diabetes ([place]) 07/08/2021   Glaucoma 07/08/2021   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   Visual field defect of left eye 07/08/2021   Debility 06/16/2021   Hypokalemia    Seizures ([facility])    Benign essential HTN    Controlled type 2 diabetes mellitus with hyperglycemia, without long-term current  use of insulin ([place])    Stridor 06/05/2021   Status post tracheostomy ([place])    Acute respiratory failure with hypoxia ([place])    Status epilepticus ([place])    Brain tumor ([place]) 05/18/2021    PCP: Seward Carol, MD   REFERRING PROVIDER: Seward Carol, MD   REFERRING DIAG: Diagnosis M48.061 (ICD-10-CM) - Spinal stenosis, lumbar region without neurogenic claudication   Rationale for Evaluation and Treatment: Rehabilitation  THERAPY DIAG:  Muscle weakness (generalized)  Difficulty in walking, not elsewhere classified  Other low back pain  Unsteadiness on feet  ONSET DATE: 07/06/2022   SUBJECTIVE:  SUBJECTIVE STATEMENT:  saw MD and ordering MRI.. talked to MD about wheezing and ordered inhaler. Still leg pain on/off   PERTINENT HISTORY:  Meningioma, removed, R BG infarct, old  PAIN:  Are you having pain? Yes 2/10 posterior legs; 0/10 in back  PRECAUTIONS: None  WEIGHT BEARING RESTRICTIONS: No  FALLS:  Has patient fallen in last 6 months? No  LIVING ENVIRONMENT: Lives with: lives alone Lives in: House/apartment Stairs: Yes: External: 2 steps; on right going up Has following equipment at home: Single point cane and Walker - 2 wheeled  OCCUPATION: Receptionist, sitting, full time  PLOF: Independent  PATIENT GOALS: Decrease pain, be able to move around without pain   OBJECTIVE:   DIAGNOSTIC FINDINGS:  Lumbar spinal stenosis  PATIENT SURVEYS:  FOTO 40.26  SCREENING FOR RED FLAGS: Bowel or bladder incontinence: No Spinal tumors: No Cauda equina syndrome: No Compression fracture: No Abdominal aneurysm: No  COGNITION: Overall cognitive status: Within functional limits for tasks assessed    MUSCLE LENGTH: Hamstrings: Right 82 deg; Left 84 deg B hip flexors appear  tight  POSTURE: increased lumbar lordosis, decreased thoracic kyphosis, right pelvic obliquity, and R shoulder elevated RLE longer than L 3/4"  PALPATION: N TTP in back or buttocks or legs  LUMBAR ROM:   AROM eval 09/02/22 09/13/22  Flexion WNL WNLs WFL  Extension Severely limited Decreased 90% Decreased 50%  Right lateral flexion WNL WFLs WFL  Left lateral flexion 3" above knee WFLS WFL  Right rotation 30% WFLs WFL  Left rotation 40% WFLs WFL   (Blank rows = not tested)  LOWER EXTREMITY ROM:    WNL B  LOWER EXTREMITY MMT:    MMT Right eval Left eval RT/Left 09/02/22 RT/Left 10/07/22 Rt/Lt 10/18/22  Hip flexion 3+ 3+ 4/4 4/4 4/4  Hip extension 3- 3- 4/4 4+/4+ 3+/3+   Hip abduction 3 3 4/4 4+/4+ 3+/3+ without compensation in sidelying  Hip adduction       Hip internal rotation       Hip external rotation       Knee flexion 4- 4-  4+/4+ 5/5  Knee extension 3+ 3+  4/4 4+/4+  Ankle dorsiflexion 4 4  4/4 4+/4+  Ankle plantarflexion       Ankle inversion       Ankle eversion        (Blank rows = not tested)  LUMBAR SPECIAL TESTS:  Straight leg raise test: Negative and Slump test: Negative  FUNCTIONAL TESTS:  5 times sit to stand: 19.26    09/02/22 12.3 sec 10/18/22 13.9 sec Timed up and go (TUG): 14.50   09/02/22 no AD 8.1 sec Functional gait assessment: TBD  10/18/22 15/30    GAIT: Distance walked: at least 2 laps around gym  Assistive device utilized: Single point cane and it was set too tall, but she declined to lower it. Level of assistance: Modified independence Comments: Patient declined to adjust cane height.  TODAY'S TREATMENT:  DATE:  12/02/22 Nustep L 6 64mn Resisted gait fwd,back and laterally 30# 5 x each with 4 inch taps Red tband hip ext,flex and abd 10 x BIL ( weakness with abduction) Red tband HS curl 2 sets 10 standing HS  curl 25# 2 sets 10 Knee ext 10# 2 sets 10 Seated clams and hip flex blue tband, hip abd band at ankles green 2 sets 10 Feet on ball bridge, KTC and obl 15 x each Iso abdominlas with ball   11/25/22 Nustep L 6 7 min Lumbar ROM WFLS MMT LE 4+/5 except hip flex 4/5 but overall noted func fatigue with ex Black Tband trunk flex and ext 20 x Seated Rows and Lats 25# 2 sets 10 Standing on airex marching 20 x 2 sets CGA 6 inch step up RT down RT 10 x then 10 x Left- UE assist ( noted left leg weaker than RT) CGA HHA side stepping over foam rolls on airex- min A Isometric upper abs and lower abdominals 10 x each 5# step tap 10 x BIL fwd and laterally, then alt 20 x ( left leg notably weaker) unable to do lateral with wt on HS curl 25# 2 sets 10 Knee ext 10# 2 set s10     11/15/22:  Nustep L 6 791m 3 way plyo ball rolls outs 10 each with large blue ball  Feet on ball bridge, KTC and obl 15 x each Iso abdominal 15 x Resisted trunk rotation 15 x Clams and hip flexion tband 2 sets 10 in supine hook lying Seated pelvic tilt with marching Black tband trunk flex and ext 2 sets 10 Seated row and lats 2 sets 10 x with green theraband PROM LE and trunk in supine, therapist providing gentle overpressure and prolonged stretch  11/11/22 Nustep L 6 [id number] way plyo ball rolls outs 10 each Feet on ball bridge, KTC and obl 15 x each Iso abdominal 15 x Resisted trunk rotation 15 x Clams and hip flexion tband 2 sets 10 Black tband trunk flex and ext 2 sets 10 Seated row and lats 2 sets 10 PROM LE and trunk   11/04/22 Nustep L 5  32mi16m Ue's and LE's Modified sit ups 10 x STS with wt ball chest press 10 x Standing wt ball trunk rotation 12 x each Seated wt ball diagonals 10 each way 3 way plyoball roll outs 10 each way Supine clams with green tband and ppt 2 sets 10 Supine hip flexion green tband and ppt 2 sets 10 Feet on ball bridge, obl and KTC Iso abdominals upper and lower with  ball PROM LE and trunk     Nustep L 5  32min60mUe's and LE's Black bar plantarflexor stretch 30 sec each side, 3 sets  Black bar DF and PF 20 x Feet on ball bridge 2 sets 10, obl 20, KTC 10x Supine ppt 10 x hold 3 sec Supine isometric knee to chest 5 sec holds, 10 x each side Supine clams with green tband and ppt 2 sets 10 Supine hip flexion green tband and ppt 2 sets 10 Sidelying B for hip abduction with leg straight, therapist lightly supported leg to assist with eccentric lowering.  2 sets each 12 reps  PROM and stretches to back and LE with neural tension glides Seated tailor's stretch 30 sec hold 3 reps each leg   2/1/202/12/24ep L 4 32min 78md dead lift without wt 10x Black bar DF and PF 20 x  Feet on ball bridge 2 sets 10, obl 20, KTC 10x Isometric abdominals 15 x hold 3sec Supine ppt 15 x hold 3 sec Supine clams with green tband and ppt 2 sets 10 Supine hip flexion green tband and ppt 2 sets 10 PROM and stretches to back and LE with neural tension glides  10/25/22 NuStep L3 x 6 minutes Supine over Blue physioball- bridge x 10, repeat with hip IR, B KTC , but reported pain moving into the backs of her legs, bridge with side to side roll- 10 each Supine SLR x 10 each leg, clamshells,  Supine stretch B KTC, LTR, ITB stretch Side lying hip abd, 10 reps each side Seated HS stretch, 3 x 15 seconds each leg  10/21/22 Nustep L 5 56mn LE only Resisted gait 30 # 4 way 5 x each TM ( with UE support) 4 min- working on increased stride and lifter steps .7 mph HS curl 25# 2 sets 10 Knee ext 10# 3 sets 5 ( last week unable to do 10#) Standing red tband hip flex,ext and abd 10 x each ( pulling sensation in post leg with extension stopped when mvmt stopped, abd weak) Step ups 6 inch 10 x fwd, 10 x laterally with UE support- left leg weaker with step ups STS with wt ball press 2 sets 10  10/18/22  Nustep L5 x 5 min Hamstring stretch x30 sec R&L Figure 4 stretch 2x30 sec R&L Bridging  x10 Forward tap 4" step 2x10 Side tap 4" step 2x10 R&L See assessments above  10/14/22 Nustep L 5 648m Amb 3 laps without AD 1 min 46 sec Obstacle course stepping on and over object- CG-min A with HHA needed for 6 inch curb and large roll Obstacle course step over step Various 6 inch step ups ex- 2 HHA then 1 HHA Resisted gait 30 # 4 way 4 x each Seated row and lat pull down 20# 2 sets 12 Black tband trunk ext 2 sets 10  10/07/22 Nustep L 5 6 min MMT assessed STS wt ball chest press 10 x Standing wt ball rotation 10 each and 10 x OH press HS curl 20# 2 sets 10 Knee ext 5# 2 sets 10 ( 10# was too heavy) Seated row and lat pull down 20# 2 sets 12 Black tband trunk ext 2 sets 10 Amb 3 laps without AD about 350 feet  1 min 42 sec Forward and lateral 4 in step ups x 10 each bilaterally HHA x 2    10/04/22 NuStep L4 x 6 min S2S holding yellow ball 2x10  Forward and lateral 4 in step ups x 10 each bilaterally HHA x 2  HS curls green 2x12 LAQ 2lb 2x12 each  Standing march 2lb 2x10 each Seated row and lats 2 sets 12 20#   09/16/22 Bike L 4 2 min then decreased L 2 4 min STS on airex, slight elevated mat 10x with CGA- some instability HHA on airex 3# ankle wts 10 x SL then 10 x alt Marching,hip flex SL ,hip abd and hip ext HHA marching fwd and back 10 feet 2 x, laterally 10 feet 2 x Standing HS curl 3# 2 sets 10 Wt ball trunk ext,rotation,chest press and OH 10 x each Amb 360 feet without AD SBA 2 min 30 sec Seated row and lats 2 sets 12 20# Hamstring Curls 20lb 2x12  Leg Ext 5lb 2x12.    09/13/22 NuStep L5 x5 min Foto Gait W/ SPC 360 feet reports  some stinging int he back of her legs Hamstring Curls 20lb 2x12  Leg Ext 5lb 2x12. S2S holding red ball 2x10   09/06/22 NuStep L 5 x 6 min  Hamstring Curls 20lb 3x10  Leg Ext 5lb 3x10 Shoulder Ext 5lb 2x10  Standing rows 10lb 2x10 Sit to stand 2x10  Supine bridges 2x10 PROM/stretching LE and trunk   PATIENT  EDUCATION:  Education details: POC Person educated: Patient Education method: Explanation Education comprehension: verbalized understanding  HOME EXERCISE PROGRAM:  [id number]  ASSESSMENT:  CLINICAL IMPRESSION: patient is attending skilled physical therapy to address B lower leg pain and dysfunction related to spinal stenosis. . Pt would like to be able to amb without SPC, decrease posterior leg pain and increase strength.. ROM, MMT and goals assessed. BIL hip abd weakness noted Pt would benefit from continued PT to address above deficits. OBJECTIVE IMPAIRMENTS: Abnormal gait, decreased activity tolerance, decreased balance, decreased coordination, decreased endurance, decreased mobility, difficulty walking, decreased ROM, decreased strength, increased muscle spasms, impaired flexibility, improper body mechanics, postural dysfunction, and pain.   ACTIVITY LIMITATIONS: carrying, lifting, bending, standing, squatting, sleeping, stairs, transfers, and locomotion level  PARTICIPATION LIMITATIONS: meal prep, cleaning, laundry, interpersonal relationship, driving, shopping, and occupation  PERSONAL FACTORS: Age, Past/current experiences, and 1 comorbidity: Meningioma  are also affecting patient's functional outcome.   REHAB POTENTIAL: Good  CLINICAL DECISION MAKING: Evolving/moderate complexity  EVALUATION COMPLEXITY: Moderate   GOALS: Goals reviewed with patient? Yes  SHORT TERM GOALS: Target date: 08/23/2022  I with basic HEP Baseline: Goal status: met 08/26/22  PRIOR LONG TERM GOALS: Target date: 10/18/2022  I with final HEP Baseline:  Goal status:  On going 10/07/22  10/14/22 [email or web address]  10/28/22 ongoing 11/04/22 on going  11/12/22 evolving 11/25/22  2.  Increase FOTO score to at least 52 Baseline: 40 Goal status: Met  09/13/22  3.  Decrease TUG14.5 time to < 12 Baseline:  Goal status: 08/26/22 with SPC 9.58 sec MET  4.  Decrease 5 x STS to < 12 sec Baseline: 19 Goal status:  08/26/22 12.47sec progressing. 09/02/22 MET  5.  Patient will walk x at least 300' with LRAD, MI, no pain in legs Baseline:  Goal status: On going 09/13/22. 10/07/22 on going 10/14/22 distance met but pain 10/28/22 pain is an issue  Distance met but pain 11/04/22 11/12/22 on going 11/25/22 progressing  6.  Assess and update sitting posture in her workspace to provide adequate trunk support. Baseline:  Goal status: Progressing 09/13/22, progressing 10/07/22 MET 10/14/22  REVISED LONG TERM GOALS: Target date: 12/13/2022  I with final HEP for return to community wellness/gym (per pt goals) Baseline:  Goal status:  On going 10/07/22  10/14/22 evolving 11/04/22 and 11/12/22. Evolving 11/25/22 and 12/01/08  2.  Patient will walk x at least 500' with LRAD, MI, no pain in legs Baseline: Had been able to do 300' with LRAD but had pain in posterior legs Goal status: Revised goal 11/04/22 On going 11/25/22 progressing 12/02/22  3.  Pt will demo at least 4+/5 hip abd in sidelying for improved standing stability Baseline:  Goal status: Revised goal  ongoing 10/28/22 Progressing 11/04/22 and 11/12/22. 11/25/22 MEt except hip flex 4/5 and fatigues quickly overall 12/02/22 fatigue quickly and abd weakness in hip snoted  4.  Pt will have improved FGA to >/=22/30 to demo decreased fall risk with amb Baseline:  Goal status: New  PLAN:  PT FREQUENCY: 1-2x/week  PT DURATION: 8 weeks  PLANNED INTERVENTIONS: Therapeutic exercises, Therapeutic  activity, Neuromuscular re-education, Balance training, Gait training, Patient/Family education, Self Care, Joint mobilization, Stair training, Dry Needling, Electrical stimulation, Cryotherapy, Moist heat, Ionotophoresis '4mg'$ /ml Dexamethasone, and Manual therapy.  PLAN FOR NEXT SESSION: progress with core hip stability.   Lama Narayanan,ANGIE, PTA 12/02/2022, 4:54 PM [facility] [place]. [place], [place], [address] Phone: [phone]   Fax:   [facility] at [place]. [place], [place], [address] Phone: [phone]   Fax:  [facility] at [place]. [place], [place], [address] Phone: [phone]   Fax:  [phone]  Patient Details  Name: Olivia Shah MRN: [id number] Date of Birth: September 30, 1955 Referring Provider:  Seward Carol, MD  Encounter Date: 12/02/2022   Laqueta Carina, PTA 12/02/2022, 4:54 PM  [facility] [place]. [place], [place], [address] Phone: [phone]   Fax:  [phone]

## 2022-12-06 ENCOUNTER — Ambulatory Visit: Payer: Medicare Other

## 2022-12-06 ENCOUNTER — Other Ambulatory Visit: Payer: Self-pay

## 2022-12-06 DIAGNOSIS — R2681 Unsteadiness on feet: Secondary | ICD-10-CM

## 2022-12-06 DIAGNOSIS — R252 Cramp and spasm: Secondary | ICD-10-CM

## 2022-12-06 DIAGNOSIS — R262 Difficulty in walking, not elsewhere classified: Secondary | ICD-10-CM

## 2022-12-06 DIAGNOSIS — M6281 Muscle weakness (generalized): Secondary | ICD-10-CM | POA: Diagnosis not present

## 2022-12-06 DIAGNOSIS — R278 Other lack of coordination: Secondary | ICD-10-CM

## 2022-12-06 DIAGNOSIS — G8929 Other chronic pain: Secondary | ICD-10-CM

## 2022-12-06 DIAGNOSIS — M5459 Other low back pain: Secondary | ICD-10-CM

## 2022-12-06 NOTE — Therapy (Signed)
OUTPATIENT PHYSICAL THERAPY THORACOLUMBAR TREATMENT       Patient Name: Olivia Shah MRN: [id number] DOB:1955/10/15, 67 y.o., female Today's Date: 12/06/2022   PT End of Session - 12/06/22 1059     Visit Number 25    Date for PT Re-Evaluation 12/24/22    PT Start Time 1100    PT Stop Time 1145    PT Time Calculation (min) 45 min    Activity Tolerance Patient tolerated treatment well    Behavior During Therapy [facility] for tasks assessed/performed                Past Medical History:  Diagnosis Date   Arthritis    Diabetes mellitus without complication ([place])    Hypertension    Past Surgical History:  Procedure Laterality Date   APPLICATION OF CRANIAL NAVIGATION N/A 05/18/2021   Procedure: APPLICATION OF CRANIAL NAVIGATION;  Surgeon: Judith Part, MD;  Location: [place];  Service: Neurosurgery;  Laterality: N/A;   BREAST BIOPSY Right    CHOLECYSTECTOMY     CRANIOTOMY Right 05/18/2021   Procedure: Right craniotomy for tumor resection;  Surgeon: Judith Part, MD;  Location: [place];  Service: Neurosurgery;  Laterality: Right;   EYE SURGERY     as a child   HERNIA REPAIR     Patient Active Problem List   Diagnosis Date Noted   Abnormal findings on diagnostic imaging of other specified body structures 01/14/2022   Chronic pain 01/14/2022   Colon cancer screening 01/14/2022   Dysphagia 01/14/2022   Family history of malignant neoplasm of digestive organs 01/14/2022   Hyperglycemia due to type 2 diabetes mellitus ([place]) 01/14/2022   Hypertension 01/14/2022   Meralgia paresthetica 01/14/2022   Mixed hyperlipidemia 01/14/2022   Morbid obesity ([place]) 01/14/2022   Overweight 01/14/2022   Pure hypercholesterolemia 01/14/2022   Spinal cord disease ([place]) 01/14/2022   Chronic cough 12/01/2021   Diabetes ([place]) 07/08/2021   Glaucoma 07/08/2021   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   Visual field defect of left eye 07/08/2021   Debility 06/16/2021   Hypokalemia     Seizures (H[facility])    Benign essential HTN    Controlled type 2 diabetes mellitus with hyperglycemia, without long-term current use of insulin ([place])    Stridor 06/05/2021   Status post tracheostomy ([place])    Acute respiratory failure with hypoxia ([place])    Status epilepticus ([place])    Brain tumor ([place]) 05/18/2021    PCP: Seward Carol, MD   REFERRING PROVIDER: Seward Carol, MD   REFERRING DIAG: Diagnosis M48.061 (ICD-10-CM) - Spinal stenosis, lumbar region without neurogenic claudication   Rationale for Evaluation and Treatment: Rehabilitation  THERAPY DIAG:  Muscle weakness (generalized)  Difficulty in walking, not elsewhere classified  Unsteadiness on feet  Other low back pain  Chronic bilateral low back pain with bilateral sciatica  Other lack of coordination  Cramp and spasm  ONSET DATE: 07/06/2022   SUBJECTIVE:  SUBJECTIVE STATEMENT:  The numbness  B thighs comes and goes, doesn't seem to be related to my position or activity.  The PCP ordered albuterol for my wheezing but it cost too much so I have a call in to see if there is anything more affordable . PERTINENT HISTORY:  Meningioma, removed, R BG infarct, old  PAIN:  Are you having pain? Yes 2/10 posterior legs; 0/10 in back  PRECAUTIONS: None  WEIGHT BEARING RESTRICTIONS: No  FALLS:  Has patient fallen in last 6 months? No  LIVING ENVIRONMENT: Lives with: lives alone Lives in: House/apartment Stairs: Yes: External: 2 steps; on right going up Has following equipment at home: Single point cane and Walker - 2 wheeled  OCCUPATION: Receptionist, sitting, full time  PLOF: Independent  PATIENT GOALS: Decrease pain, be able to move around without pain   OBJECTIVE:   DIAGNOSTIC FINDINGS:  Lumbar spinal stenosis  PATIENT  SURVEYS:  FOTO 40.26  SCREENING FOR RED FLAGS: Bowel or bladder incontinence: No Spinal tumors: No Cauda equina syndrome: No Compression fracture: No Abdominal aneurysm: No  COGNITION: Overall cognitive status: Within functional limits for tasks assessed    MUSCLE LENGTH: Hamstrings: Right 82 deg; Left 84 deg B hip flexors appear tight  POSTURE: increased lumbar lordosis, decreased thoracic kyphosis, right pelvic obliquity, and R shoulder elevated RLE longer than L 3/4"  PALPATION: N TTP in back or buttocks or legs  LUMBAR ROM:   AROM eval 09/02/22 09/13/22  Flexion WNL WNLs WFL  Extension Severely limited Decreased 90% Decreased 50%  Right lateral flexion WNL WFLs WFL  Left lateral flexion 3" above knee WFLS WFL  Right rotation 30% WFLs WFL  Left rotation 40% WFLs WFL   (Blank rows = not tested)  LOWER EXTREMITY ROM:    WNL B  LOWER EXTREMITY MMT:    MMT Right eval Left eval RT/Left 09/02/22 RT/Left 10/07/22 Rt/Lt 10/18/22  Hip flexion 3+ 3+ 4/4 4/4 4/4  Hip extension 3- 3- 4/4 4+/4+ 3+/3+   Hip abduction 3 3 4/4 4+/4+ 3+/3+ without compensation in sidelying  Hip adduction       Hip internal rotation       Hip external rotation       Knee flexion 4- 4-  4+/4+ 5/5  Knee extension 3+ 3+  4/4 4+/4+  Ankle dorsiflexion 4 4  4/4 4+/4+  Ankle plantarflexion       Ankle inversion       Ankle eversion        (Blank rows = not tested)  LUMBAR SPECIAL TESTS:  Straight leg raise test: Negative and Slump test: Negative  FUNCTIONAL TESTS:  5 times sit to stand: 19.26    09/02/22 12.3 sec 10/18/22 13.9 sec Timed up and go (TUG): 14.50   09/02/22 no AD 8.1 sec Functional gait assessment: TBD  10/18/22 15/30    GAIT: Distance walked: at least 2 laps around gym  Assistive device utilized: Single point cane and it was set too tall, but she declined to lower it. Level of assistance: Modified independence Comments: Patient declined to adjust cane height.  TODAY'S  TREATMENT:  DATE: 12/06/22: Nustep L 6 43mn tband hip ext,flex and abd 10 x BIL ( weakness with abduction)  tband HS curl 2 sets 10 seated green HS curl 25# 2 sets 10 Knee ext 10# 2 sets 10 Standing for heel /toe raises at sink, 10 reps x 2 Standing at sink for unilateral high marches, 10 reps x 2 Feet on green physioball bridge 2 sets 10,  KTC and obl 10 x each Iso abdominals with red physioball 10 reps,  5 sec holds , 2 sets Seated for pelvic tilts with marching 2 x 10 Rows, lat pul downs 15 #, 1 set each of 10 reps.  12/02/22 Nustep L 6 784m Resisted gait fwd,back and laterally 30# 5 x each with 4 inch taps Red tband hip ext,flex and abd 10 x BIL ( weakness with abduction) Red tband HS curl 2 sets 10 standing HS curl 25# 2 sets 10 Knee ext 10# 2 sets 10 Seated clams and hip flex blue tband, hip abd band at ankles green 2 sets 10 Feet on ball bridge, KTC and obl 15 x each Iso abdominals with red ball   11/25/22 Nustep L 6 7 min Lumbar ROM WFLS MMT LE 4+/5 except hip flex 4/5 but overall noted func fatigue with ex Black Tband trunk flex and ext 20 x Seated Rows and Lats 25# 2 sets 10 Standing on airex marching 20 x 2 sets CGA 6 inch step up RT down RT 10 x then 10 x Left- UE assist ( noted left leg weaker than RT) CGA HHA side stepping over foam rolls on airex- min A Isometric upper abs and lower abdominals 10 x each 5# step tap 10 x BIL fwd and laterally, then alt 20 x ( left leg notably weaker) unable to do lateral with wt on HS curl 25# 2 sets 10 Knee ext 10# 2 set s10     11/15/22:  Nustep L 6 26m26m3 way plyo ball rolls outs 10 each with large blue ball  Feet on ball bridge, KTC and obl 15 x each Iso abdominal 15 x Resisted trunk rotation 15 x Clams and hip flexion tband 2 sets 10 in supine hook lying Seated pelvic tilt with marching Black  tband trunk flex and ext 2 sets 10 Seated row and lats 2 sets 10 x with green theraband PROM LE and trunk in supine, therapist providing gentle overpressure and prolonged stretch  11/11/22 Nustep L 6 26mi11m way plyo ball rolls outs 10 each Feet on ball bridge, KTC and obl 15 x each Iso abdominal 15 x Resisted trunk rotation 15 x Clams and hip flexion tband 2 sets 10 Black tband trunk flex and ext 2 sets 10 Seated row and lats 2 sets 10 PROM LE and trunk   11/04/22 Nustep L 5  26min58mUe's and LE's Modified sit ups 10 x STS with wt ball chest press 10 x Standing wt ball trunk rotation 12 x each Seated wt ball diagonals 10 each way 3 way plyoball roll outs 10 each way Supine clams with green tband and ppt 2 sets 10 Supine hip flexion green tband and ppt 2 sets 10 Feet on ball bridge, obl and KTC Iso abdominals upper and lower with ball PROM LE and trunk     Nustep L 5  26min 28me's and LE's Black bar plantarflexor stretch 30 sec each side, 3 sets  Black bar DF and PF 20 x Feet on ball bridge 2 sets  10, obl 20, KTC 10x Supine ppt 10 x hold 3 sec Supine isometric knee to chest 5 sec holds, 10 x each side Supine clams with green tband and ppt 2 sets 10 Supine hip flexion green tband and ppt 2 sets 10 Sidelying B for hip abduction with leg straight, therapist lightly supported leg to assist with eccentric lowering.  2 sets each 12 reps  PROM and stretches to back and LE with neural tension glides Seated tailor's stretch 30 sec hold 3 reps each leg   11-16-2022 Nustep L 4 61mn  Mod dead lift without wt 10x Black bar DF and PF 20 x Feet on ball bridge 2 sets 10, obl 20, KTC 10x Isometric abdominals 15 x hold 3sec Supine ppt 15 x hold 3 sec Supine clams with green tband and ppt 2 sets 10 Supine hip flexion green tband and ppt 2 sets 10 PROM and stretches to back and LE with neural tension glides  10/25/22 NuStep L3 x 6 minutes Supine over Blue physioball- bridge x 10, repeat  with hip IR, B KTC , but reported pain moving into the backs of her legs, bridge with side to side roll- 10 each Supine SLR x 10 each leg, clamshells,  Supine stretch B KTC, LTR, ITB stretch Side lying hip abd, 10 reps each side Seated HS stretch, 3 x 15 seconds each leg  10/21/22 Nustep L 5 623m LE only Resisted gait 30 # 4 way 5 x each TM ( with UE support) 4 min- working on increased stride and lifter steps .7 mph HS curl 25# 2 sets 10 Knee ext 10# 3 sets 5 ( last week unable to do 10#) Standing red tband hip flex,ext and abd 10 x each ( pulling sensation in post leg with extension stopped when mvmt stopped, abd weak) Step ups 6 inch 10 x fwd, 10 x laterally with UE support- left leg weaker with step ups STS with wt ball press 2 sets 10  10/18/22  Nustep L5 x 5 min Hamstring stretch x30 sec R&L Figure 4 stretch 2x30 sec R&L Bridging x10 Forward tap 4" step 2x10 Side tap 4" step 2x10 R&L See assessments above  10/14/22 Nustep L 5 72m3mAmb 3 laps without AD 1 min 46 sec Obstacle course stepping on and over object- CG-min A with HHA needed for 6 inch curb and large roll Obstacle course step over step Various 6 inch step ups ex- 2 HHA then 1 HHA Resisted gait 30 # 4 way 4 x each Seated row and lat pull down 20# 2 sets 12 Black tband trunk ext 2 sets 10  10/07/22 Nustep L 5 6 min MMT assessed STS wt ball chest press 10 x Standing wt ball rotation 10 each and 10 x OH press HS curl 20# 2 sets 10 Knee ext 5# 2 sets 10 ( 10# was too heavy) Seated row and lat pull down 20# 2 sets 12 Black tband trunk ext 2 sets 10 Amb 3 laps without AD about 350 feet  1 min 42 sec Forward and lateral 4 in step ups x 10 each bilaterally HHA x 2    10/04/22 NuStep L4 x 6 min S2S holding yellow ball 2x10  Forward and lateral 4 in step ups x 10 each bilaterally HHA x 2  HS curls green 2x12 LAQ 2lb 2x12 each  Standing march 2lb 2x10 each Seated row and lats 2 sets 12 20#   09/16/22 Bike L 4 2  min  then decreased L 2 4 min STS on airex, slight elevated mat 10x with CGA- some instability HHA on airex 3# ankle wts 10 x SL then 10 x alt Marching,hip flex SL ,hip abd and hip ext HHA marching fwd and back 10 feet 2 x, laterally 10 feet 2 x Standing HS curl 3# 2 sets 10 Wt ball trunk ext,rotation,chest press and OH 10 x each Amb 360 feet without AD SBA 2 min 30 sec Seated row and lats 2 sets 12 20# Hamstring Curls 20lb 2x12  Leg Ext 5lb 2x12.    09/13/22 NuStep L5 x5 min Foto Gait W/ SPC 360 feet reports some stinging int he back of her legs Hamstring Curls 20lb 2x12  Leg Ext 5lb 2x12. S2S holding red ball 2x10   09/06/22 NuStep L 5 x 6 min  Hamstring Curls 20lb 3x10  Leg Ext 5lb 3x10 Shoulder Ext 5lb 2x10  Standing rows 10lb 2x10 Sit to stand 2x10  Supine bridges 2x10 PROM/stretching LE and trunk   PATIENT EDUCATION:  Education details: POC Person educated: Patient Education method: Explanation Education comprehension: verbalized understanding  HOME EXERCISE PROGRAM:  [id number]  ASSESSMENT:  CLINICAL IMPRESSION: The patient is attending skilled physical therapy to address B lower leg pain and dysfunction related to spinal stenosis. More recent onset of B thigh numbness which is intermittent.  Her MD has ordered MRI due to this new Sx. Gait with + trendelenberg L.  Patient able to complete numerous difference activity challenges to improve her LE strength.  Pt benefits from continued PT to address above deficits. OBJECTIVE IMPAIRMENTS: Abnormal gait, decreased activity tolerance, decreased balance, decreased coordination, decreased endurance, decreased mobility, difficulty walking, decreased ROM, decreased strength, increased muscle spasms, impaired flexibility, improper body mechanics, postural dysfunction, and pain.   ACTIVITY LIMITATIONS: carrying, lifting, bending, standing, squatting, sleeping, stairs, transfers, and locomotion level  PARTICIPATION  LIMITATIONS: meal prep, cleaning, laundry, interpersonal relationship, driving, shopping, and occupation  PERSONAL FACTORS: Age, Past/current experiences, and 1 comorbidity: Meningioma  are also affecting patient's functional outcome.   REHAB POTENTIAL: Good  CLINICAL DECISION MAKING: Evolving/moderate complexity  EVALUATION COMPLEXITY: Moderate   GOALS: Goals reviewed with patient? Yes  SHORT TERM GOALS: Target date: 08/23/2022  I with basic HEP Baseline: Goal status: met 08/26/22  PRIOR LONG TERM GOALS: Target date: 10/18/2022  I with final HEP Baseline:  Goal status:  On going 10/07/22  10/14/22 [email or web address]  10/28/22 ongoing 11/04/22 on going  11/12/22 evolving 11/25/22  2.  Increase FOTO score to at least 52 Baseline: 40 Goal status: Met  09/13/22  3.  Decrease TUG14.5 time to < 12 Baseline:  Goal status: 08/26/22 with SPC 9.58 sec MET  4.  Decrease 5 x STS to < 12 sec Baseline: 19 Goal status: 08/26/22 12.47sec progressing. 09/02/22 MET  5.  Patient will walk x at least 300' with LRAD, MI, no pain in legs Baseline:  Goal status: On going 09/13/22. 10/07/22 on going 10/14/22 distance met but pain 10/28/22 pain is an issue  Distance met but pain 11/04/22 11/12/22 on going 11/25/22 progressing  6.  Assess and update sitting posture in her workspace to provide adequate trunk support. Baseline:  Goal status: Progressing 09/13/22, progressing 10/07/22 MET 10/14/22  REVISED LONG TERM GOALS: Target date: 12/13/2022  I with final HEP for return to community wellness/gym (per pt goals) Baseline:  Goal status:  On going 10/07/22  10/14/22 evolving 11/04/22 and 11/12/22. Evolving 11/25/22 and 12/01/08  2.  Patient will walk x  at least 500' with LRAD, MI, no pain in legs Baseline: Had been able to do 300' with LRAD but had pain in posterior legs Goal status: Revised goal 11/04/22 On going 11/25/22 progressing 12/02/22  3.  Pt will demo at least 4+/5 hip abd in sidelying for improved standing  stability Baseline:  Goal status: Revised goal  ongoing 10/28/22 Progressing 11/04/22 and 11/12/22. 11/25/22 MEt except hip flex 4/5 and fatigues quickly overall 12/02/22 fatigue quickly and abd weakness in hip snoted  4.  Pt will have improved FGA to >/=22/30 to demo decreased fall risk with amb Baseline:  Goal status: New  PLAN:  PT FREQUENCY: 1-2x/week  PT DURATION: 8 weeks  PLANNED INTERVENTIONS: Therapeutic exercises, Therapeutic activity, Neuromuscular re-education, Balance training, Gait training, Patient/Family education, Self Care, Joint mobilization, Stair training, Dry Needling, Electrical stimulation, Cryotherapy, Moist heat, Ionotophoresis '4mg'$ /ml Dexamethasone, and Manual therapy.  PLAN FOR NEXT SESSION: progress with core hip stability.   Tim Lair, PT 12/06/2022, 11:58 AM [facility] at [place]. [place], [place], [address] Phone: [phone]   Fax:  [facility] at [place]. [place], [place], [address] Phone: [phone]   Fax:  [facility] at [place]. [place], [place], [address] Phone: [phone]   Fax:  [phone]  Patient Details  Name: Olivia Shah MRN: [id number] Date of Birth: [birth year] Referring Provider:  Seward Carol, MD  Encounter Date: 12/06/2022   Tim Lair, PT 12/06/2022, 11:58 AM  [facility] at [place]. [place], [place], [address] Phone: [phone]   Fax:  [phone]

## 2022-12-09 ENCOUNTER — Ambulatory Visit: Payer: Medicare Other | Admitting: Physical Therapy

## 2022-12-09 DIAGNOSIS — M6281 Muscle weakness (generalized): Secondary | ICD-10-CM | POA: Diagnosis not present

## 2022-12-09 DIAGNOSIS — R2681 Unsteadiness on feet: Secondary | ICD-10-CM

## 2022-12-09 DIAGNOSIS — R262 Difficulty in walking, not elsewhere classified: Secondary | ICD-10-CM

## 2022-12-09 DIAGNOSIS — M5459 Other low back pain: Secondary | ICD-10-CM

## 2022-12-09 NOTE — Therapy (Signed)
OUTPATIENT PHYSICAL THERAPY THORACOLUMBAR TREATMENT       Patient Name: Olivia Shah MRN: [id number] DOB:12-13-1955, 67 y.o., female Today's Date: 12/09/2022   PT End of Session - 12/09/22 1701     Visit Number 26    Date for PT Re-Evaluation 12/24/22    PT Start Time 1655    PT Stop Time 1745    PT Time Calculation (min) 50 min                Past Medical History:  Diagnosis Date   Arthritis    Diabetes mellitus without complication ([place])    Hypertension    Past Surgical History:  Procedure Laterality Date   APPLICATION OF CRANIAL NAVIGATION N/A 05/18/2021   Procedure: APPLICATION OF CRANIAL NAVIGATION;  Surgeon: Judith Part, MD;  Location: [facility];  Service: Neurosurgery;  Laterality: N/A;   BREAST BIOPSY Right    CHOLECYSTECTOMY     CRANIOTOMY Right 05/18/2021   Procedure: Right craniotomy for tumor resection;  Surgeon: Judith Part, MD;  Location: [facility];  Service: Neurosurgery;  Laterality: Right;   EYE SURGERY     as a child   HERNIA REPAIR     Patient Active Problem List   Diagnosis Date Noted   Abnormal findings on diagnostic imaging of other specified body structures 01/14/2022   Chronic pain 01/14/2022   Colon cancer screening 01/14/2022   Dysphagia 01/14/2022   Family history of malignant neoplasm of digestive organs 01/14/2022   Hyperglycemia due to type 2 diabetes mellitus ([place]) 01/14/2022   Hypertension 01/14/2022   Meralgia paresthetica 01/14/2022   Mixed hyperlipidemia 01/14/2022   Morbid obesity ([place]) 01/14/2022   Overweight 01/14/2022   Pure hypercholesterolemia 01/14/2022   Spinal cord disease ([place]) 01/14/2022   Chronic cough 12/01/2021   Diabetes ([place]) 07/08/2021   Glaucoma 07/08/2021   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   Visual field defect of left eye 07/08/2021   Debility 06/16/2021   Hypokalemia    Seizures ([facility])    Benign essential HTN    Controlled type 2 diabetes mellitus with hyperglycemia, without long-term  current use of insulin ([place])    Stridor 06/05/2021   Status post tracheostomy ([place])    Acute respiratory failure with hypoxia ([place])    Status epilepticus ([place])    Brain tumor ([place]) 05/18/2021    PCP: Seward Carol, MD   REFERRING PROVIDER: Seward Carol, MD   REFERRING DIAG: Diagnosis M48.061 (ICD-10-CM) - Spinal stenosis, lumbar region without neurogenic claudication   Rationale for Evaluation and Treatment: Rehabilitation  THERAPY DIAG:  Muscle weakness (generalized)  Difficulty in walking, not elsewhere classified  Unsteadiness on feet  Other low back pain  ONSET DATE: 07/06/2022   SUBJECTIVE:  SUBJECTIVE STATEMENT:  feeling okay. 3/27 MRI  MD follow up 4/4   PERTINENT HISTORY:  Meningioma, removed, R BG infarct, old  PAIN:  Are you having pain? Yes 2/10 posterior legs; 0/10 in back  PRECAUTIONS: None  WEIGHT BEARING RESTRICTIONS: No  FALLS:  Has patient fallen in last 6 months? No  LIVING ENVIRONMENT: Lives with: lives alone Lives in: House/apartment Stairs: Yes: External: 2 steps; on right going up Has following equipment at home: Single point cane and Walker - 2 wheeled  OCCUPATION: Receptionist, sitting, full time  PLOF: Independent  PATIENT GOALS: Decrease pain, be able to move around without pain   OBJECTIVE:   DIAGNOSTIC FINDINGS:  Lumbar spinal stenosis  PATIENT SURVEYS:  FOTO 40.26  SCREENING FOR RED FLAGS: Bowel or bladder incontinence: No Spinal tumors: No Cauda equina syndrome: No Compression fracture: No Abdominal aneurysm: No  COGNITION: Overall cognitive status: Within functional limits for tasks assessed    MUSCLE LENGTH: Hamstrings: Right 82 deg; Left 84 deg B hip flexors appear tight  POSTURE: increased lumbar lordosis, decreased  thoracic kyphosis, right pelvic obliquity, and R shoulder elevated RLE longer than L 3/4"  PALPATION: N TTP in back or buttocks or legs  LUMBAR ROM:   AROM eval 09/02/22 09/13/22  Flexion WNL WNLs WFL  Extension Severely limited Decreased 90% Decreased 50%  Right lateral flexion WNL WFLs WFL  Left lateral flexion 3" above knee WFLS WFL  Right rotation 30% WFLs WFL  Left rotation 40% WFLs WFL   (Blank rows = not tested)  LOWER EXTREMITY ROM:    WNL B  LOWER EXTREMITY MMT:    MMT Right eval Left eval RT/Left 09/02/22 RT/Left 10/07/22 Rt/Lt 10/18/22  Hip flexion 3+ 3+ 4/4 4/4 4/4  Hip extension 3- 3- 4/4 4+/4+ 3+/3+   Hip abduction 3 3 4/4 4+/4+ 3+/3+ without compensation in sidelying  Hip adduction       Hip internal rotation       Hip external rotation       Knee flexion 4- 4-  4+/4+ 5/5  Knee extension 3+ 3+  4/4 4+/4+  Ankle dorsiflexion 4 4  4/4 4+/4+  Ankle plantarflexion       Ankle inversion       Ankle eversion        (Blank rows = not tested)  LUMBAR SPECIAL TESTS:  Straight leg raise test: Negative and Slump test: Negative  FUNCTIONAL TESTS:  5 times sit to stand: 19.26    09/02/22 12.3 sec 10/18/22 13.9 sec Timed up and go (TUG): 14.50   09/02/22 no AD 8.1 sec Functional gait assessment: TBD  10/18/22 15/30    GAIT: Distance walked: at least 2 laps around gym  Assistive device utilized: Single point cane and it was set too tall, but she declined to lower it. Level of assistance: Modified independence Comments: Patient declined to adjust cane height.  TODAY'S TREATMENT:  DATE:  12/09/22  Nustep L 6 78mn Side stepping in and out of 6 inch box 10 x 3# ankle wts- HHA needed , compensations with LLE to clear box HHA 3# fwd stepping in/out box 20 x alt- cuing for LLE as she tends to compensate with rotation 3# alt step tap 20 x 6  inch 1 x with single HHA, 1 x with no UE support but CGA Standing on airex HHA 3# ankle wts alt hip flex,ext and abd. 10x abd each leg 2 sets  ( BIL hip weakness and fatigue esp abd and Left>RT) Wt ball modified sit ups 15 x Wt ball seated press with core engaged 10 x chest press and OH Leg Press 30# feet 3 way 10 x each    12/06/22: Nustep L 6 742m tband hip ext,flex and abd 10 x BIL ( weakness with abduction)  tband HS curl 2 sets 10 seated green HS curl 25# 2 sets 10 Knee ext 10# 2 sets 10 Standing for heel /toe raises at sink, 10 reps x 2 Standing at sink for unilateral high marches, 10 reps x 2 Feet on green physioball bridge 2 sets 10,  KTC and obl 10 x each Iso abdominals with red physioball 10 reps,  5 sec holds , 2 sets Seated for pelvic tilts with marching 2 x 10 Rows, lat pul downs 15 #, 1 set each of 10 reps.  12/02/22 Nustep L 6 60m61mResisted gait fwd,back and laterally 30# 5 x each with 4 inch taps Red tband hip ext,flex and abd 10 x BIL ( weakness with abduction) Red tband HS curl 2 sets 10 standing HS curl 25# 2 sets 10 Knee ext 10# 2 sets 10 Seated clams and hip flex blue tband, hip abd band at ankles green 2 sets 10 Feet on ball bridge, KTC and obl 15 x each Iso abdominals with red ball   11/25/22 Nustep L 6 7 min Lumbar ROM WFLS MMT LE 4+/5 except hip flex 4/5 but overall noted func fatigue with ex Black Tband trunk flex and ext 20 x Seated Rows and Lats 25# 2 sets 10 Standing on airex marching 20 x 2 sets CGA 6 inch step up RT down RT 10 x then 10 x Left- UE assist ( noted left leg weaker than RT) CGA HHA side stepping over foam rolls on airex- min A Isometric upper abs and lower abdominals 10 x each 5# step tap 10 x BIL fwd and laterally, then alt 20 x ( left leg notably weaker) unable to do lateral with wt on HS curl 25# 2 sets 10 Knee ext 10# 2 set s10     11/15/22:  Nustep L 6 60mi33m way plyo ball rolls outs 10 each with large blue ball  Feet  on ball bridge, KTC and obl 15 x each Iso abdominal 15 x Resisted trunk rotation 15 x Clams and hip flexion tband 2 sets 10 in supine hook lying Seated pelvic tilt with marching Black tband trunk flex and ext 2 sets 10 Seated row and lats 2 sets 10 x with green theraband PROM LE and trunk in supine, therapist providing gentle overpressure and prolonged stretch  11/11/22 Nustep L 6 60min160mway plyo ball rolls outs 10 each Feet on ball bridge, KTC and obl 15 x each Iso abdominal 15 x Resisted trunk rotation 15 x Clams and hip flexion tband 2 sets 10 Black tband trunk flex and ext 2 sets 10 Seated row  and lats 2 sets 10 PROM LE and trunk   11/04/22 Nustep L 5  52mn B Ue's and LE's Modified sit ups 10 x STS with wt ball chest press 10 x Standing wt ball trunk rotation 12 x each Seated wt ball diagonals 10 each way 3 way plyoball roll outs 10 each way Supine clams with green tband and ppt 2 sets 10 Supine hip flexion green tband and ppt 2 sets 10 Feet on ball bridge, obl and KTC Iso abdominals upper and lower with ball PROM LE and trunk     Nustep L 5  716m B Ue's and LE's Black bar plantarflexor stretch 30 sec each side, 3 sets  Black bar DF and PF 20 x Feet on ball bridge 2 sets 10, obl 20, KTC 10x Supine ppt 10 x hold 3 sec Supine isometric knee to chest 5 sec holds, 10 x each side Supine clams with green tband and ppt 2 sets 10 Supine hip flexion green tband and ppt 2 sets 10 Sidelying B for hip abduction with leg straight, therapist lightly supported leg to assist with eccentric lowering.  2 sets each 12 reps  PROM and stretches to back and LE with neural tension glides Seated tailor's stretch 30 sec hold 3 reps each leg   10/2022-03-01ustep L 4 31m63m Mod dead lift without wt 10x Black bar DF and PF 20 x Feet on ball bridge 2 sets 10, obl 20, KTC 10x Isometric abdominals 15 x hold 3sec Supine ppt 15 x hold 3 sec Supine clams with green tband and ppt 2 sets 10 Supine  hip flexion green tband and ppt 2 sets 10 PROM and stretches to back and LE with neural tension glides  10/25/22 NuStep L3 x 6 minutes Supine over Blue physioball- bridge x 10, repeat with hip IR, B KTC , but reported pain moving into the backs of her legs, bridge with side to side roll- 10 each Supine SLR x 10 each leg, clamshells,  Supine stretch B KTC, LTR, ITB stretch Side lying hip abd, 10 reps each side Seated HS stretch, 3 x 15 seconds each leg  10/21/22 Nustep L 5 6mi65mE only Resisted gait 30 # 4 way 5 x each TM ( with UE support) 4 min- working on increased stride and lifter steps .7 mph HS curl 25# 2 sets 10 Knee ext 10# 3 sets 5 ( last week unable to do 10#) Standing red tband hip flex,ext and abd 10 x each ( pulling sensation in post leg with extension stopped when mvmt stopped, abd weak) Step ups 6 inch 10 x fwd, 10 x laterally with UE support- left leg weaker with step ups STS with wt ball press 2 sets 10  10/18/22  Nustep L5 x 5 min Hamstring stretch x30 sec R&L Figure 4 stretch 2x30 sec R&L Bridging x10 Forward tap 4" step 2x10 Side tap 4" step 2x10 R&L See assessments above  10/14/22 Nustep L 5 6min75mb 3 laps without AD 1 min 46 sec Obstacle course stepping on and over object- CG-min A with HHA needed for 6 inch curb and large roll Obstacle course step over step Various 6 inch step ups ex- 2 HHA then 1 HHA Resisted gait 30 # 4 way 4 x each Seated row and lat pull down 20# 2 sets 12 Black tband trunk ext 2 sets 10  10/07/22 Nustep L 5 6 min MMT assessed STS wt ball chest press 10 x Standing  wt ball rotation 10 each and 10 x OH press HS curl 20# 2 sets 10 Knee ext 5# 2 sets 10 ( 10# was too heavy) Seated row and lat pull down 20# 2 sets 12 Black tband trunk ext 2 sets 10 Amb 3 laps without AD about 350 feet  1 min 42 sec Forward and lateral 4 in step ups x 10 each bilaterally HHA x 2    10/04/22 NuStep L4 x 6 min S2S holding yellow ball 2x10  Forward  and lateral 4 in step ups x 10 each bilaterally HHA x 2  HS curls green 2x12 LAQ 2lb 2x12 each  Standing march 2lb 2x10 each Seated row and lats 2 sets 12 20#   09/16/22 Bike L 4 2 min then decreased L 2 4 min STS on airex, slight elevated mat 10x with CGA- some instability HHA on airex 3# ankle wts 10 x SL then 10 x alt Marching,hip flex SL ,hip abd and hip ext HHA marching fwd and back 10 feet 2 x, laterally 10 feet 2 x Standing HS curl 3# 2 sets 10 Wt ball trunk ext,rotation,chest press and OH 10 x each Amb 360 feet without AD SBA 2 min 30 sec Seated row and lats 2 sets 12 20# Hamstring Curls 20lb 2x12  Leg Ext 5lb 2x12.    09/13/22 NuStep L5 x5 min Foto Gait W/ SPC 360 feet reports some stinging int he back of her legs Hamstring Curls 20lb 2x12  Leg Ext 5lb 2x12. S2S holding red ball 2x10   09/06/22 NuStep L 5 x 6 min  Hamstring Curls 20lb 3x10  Leg Ext 5lb 3x10 Shoulder Ext 5lb 2x10  Standing rows 10lb 2x10 Sit to stand 2x10  Supine bridges 2x10 PROM/stretching LE and trunk   PATIENT EDUCATION:  Education details: POC Person educated: Patient Education method: Explanation Education comprehension: verbalized understanding  HOME EXERCISE PROGRAM:  [id number]  ASSESSMENT:  CLINICAL IMPRESSION: The patient is attending skilled physical therapy to address B lower leg pain and dysfunction related to spinal stenosis. More recent onset of B thigh numbness which is intermittent.  Her MD has ordered MRI due to this new Sx. Gait with + trendelenberg L.  Patient able to complete numerous difference activity challenges to improve her LE strength.  Pt benefits from continued PT to address above deficits. Goals assessed today OBJECTIVE IMPAIRMENTS: Abnormal gait, decreased activity tolerance, decreased balance, decreased coordination, decreased endurance, decreased mobility, difficulty walking, decreased ROM, decreased strength, increased muscle spasms, impaired flexibility,  improper body mechanics, postural dysfunction, and pain.   ACTIVITY LIMITATIONS: carrying, lifting, bending, standing, squatting, sleeping, stairs, transfers, and locomotion level  PARTICIPATION LIMITATIONS: meal prep, cleaning, laundry, interpersonal relationship, driving, shopping, and occupation  PERSONAL FACTORS: Age, Past/current experiences, and 1 comorbidity: Meningioma  are also affecting patient's functional outcome.   REHAB POTENTIAL: Good  CLINICAL DECISION MAKING: Evolving/moderate complexity  EVALUATION COMPLEXITY: Moderate   GOALS: Goals reviewed with patient? Yes  SHORT TERM GOALS: Target date: 08/23/2022  I with basic HEP Baseline: Goal status: met 08/26/22  PRIOR LONG TERM GOALS: Target date: 10/18/2022  I with final HEP Baseline:  Goal status:  On going 10/07/22  10/14/22 evolvimg  10/28/22 ongoing 11/04/22 on going  11/12/22 evolving 11/25/22  2.  Increase FOTO score to at least 52 Baseline: 40 Goal status: Met  09/13/22  3.  Decrease TUG14.5 time to < 12 Baseline:  Goal status: 08/26/22 with SPC 9.58 sec MET  4.  Decrease 5  x STS to < 12 sec Baseline: 19 Goal status: 08/26/22 12.47sec progressing. 09/02/22 MET  5.  Patient will walk x at least 300' with LRAD, MI, no pain in legs Baseline:  Goal status: On going 09/13/22. 10/07/22 on going 10/14/22 distance met but pain 10/28/22 pain is an issue  Distance met but pain 11/04/22 11/12/22 on going 11/25/22 progressing  6.  Assess and update sitting posture in her workspace to provide adequate trunk support. Baseline:  Goal status: Progressing 09/13/22, progressing 10/07/22 MET 10/14/22  REVISED LONG TERM GOALS: Target date: 12/13/2022  I with final HEP for return to community wellness/gym (per pt goals) Baseline:  Goal status:  On going 10/07/22  10/14/22 evolving 11/04/22 and 11/12/22. Evolving 11/25/22 and 12/01/08. Continue to adjust and modify as needed 12/09/22  2.  Patient will walk x at least 500' with LRAD, MI, no  pain in legs Baseline: Had been able to do 300' with LRAD but had pain in posterior legs Goal status: Revised goal 11/04/22 On going 11/25/22 progressing 12/02/22.  12/09/22 Progressing pain limits distance esp with community reentry  3.  Pt will demo at least 4+/5 hip abd in sidelying for improved standing stability Baseline:  Goal status: Revised goal  ongoing 10/28/22 Progressing 11/04/22 and 11/12/22. 11/25/22 MEt except hip flex 4/5 and fatigues quickly overall 12/02/22 fatigue quickly and abd weakness in hip snoted 12/09/22 func hip abd weakness  4.  Pt will have improved FGA to >/=22/30 to demo decreased fall risk with amb Baseline:  Goal status: New  12/09/22 progressing  PLAN:  PT FREQUENCY: 1-2x/week  PT DURATION: 8 weeks  PLANNED INTERVENTIONS: Therapeutic exercises, Therapeutic activity, Neuromuscular re-education, Balance training, Gait training, Patient/Family education, Self Care, Joint mobilization, Stair training, Dry Needling, Electrical stimulation, Cryotherapy, Moist heat, Ionotophoresis '4mg'$ /ml Dexamethasone, and Manual therapy.  PLAN FOR NEXT SESSION: progress with core hip stability.   Albertus Chiarelli,ANGIE, PTA 12/09/2022, 5:01 PM [facility] at [place]. [place], [place], [address] Phone: [phone]   Fax:  [facility] at [place]. [place], [place], [address] Phone: [phone]   Fax:  [facility] at [place]. [place], [place], [address] Phone: [phone]   Fax:  [phone]  Patient Details  Name: NOVALEE STREEVAL MRN: [id number] Date of Birth: 09-21-1956 Referring Provider:  Seward Carol, MD  Encounter Date: 12/09/2022   Laqueta Carina, PTA 12/09/2022, 5:01 PM  [facility] at [place]. [place], [place], [address] Phone:  [phone]   Fax:  [facility] at [place]. [place], [place], [address] Phone: [phone]   Fax:  [phone]  Patient Details  Name: LARKIN SIMONEAU MRN: [id number] Date of Birth: [birth year] Referring Provider:  Seward Carol, MD  Encounter Date: 12/09/2022   Laqueta Carina, PTA 12/09/2022, 5:01 PM  [facility] at [place]. [place], [place], [address] Phone: [phone]   Fax:  [phone]

## 2022-12-13 ENCOUNTER — Ambulatory Visit: Payer: Medicare Other

## 2022-12-16 ENCOUNTER — Ambulatory Visit: Payer: Medicare Other | Admitting: Physical Therapy

## 2022-12-16 DIAGNOSIS — M6281 Muscle weakness (generalized): Secondary | ICD-10-CM

## 2022-12-16 DIAGNOSIS — M5459 Other low back pain: Secondary | ICD-10-CM

## 2022-12-16 DIAGNOSIS — R2681 Unsteadiness on feet: Secondary | ICD-10-CM

## 2022-12-16 DIAGNOSIS — R262 Difficulty in walking, not elsewhere classified: Secondary | ICD-10-CM

## 2022-12-16 NOTE — Therapy (Signed)
[facility]REAT[facility]       Patient Name: Olivia Shah, 67 y.o., Olivia Shah Today's Date: 12/16/2022   PT End of Session - 12/16/22 1659     Visit Number 27    Date for PT Re-Evaluation 12/24/22    PT Start Time 1700    PT Stop Time 1745    PT Time Calculation (min) 45 min                Past Medical History:  Diagnosis Date   Arthritis    Diabetes mellitus without complication ([place])    Hypertension    Past Surgical History:  Procedure Laterality Date   APPLICATION OF CRANIAL NAVIGATION N/A 05/18/2021   Procedure: APPLICATION OF CRANIAL NAVIGATION;  Surgeon: Judith Part, MD;  Location: [place] [place];  Service: Neurosurgery;  Laterality: N/A;   BREAST BIOPSY Right    CHOLECYSTECTOMY     CRANIOTOMY Right 05/18/2021   Procedure: Right craniotomy for tumor resection;  Surgeon: Judith Part, MD;  Location: [facility];  Service: Neurosurgery;  Laterality: Right;   EYE SURGERY     as a child   HERNIA REPAIR     Patient Active Problem List   Diagnosis Date Noted   Abnormal findings on diagnostic imaging of other specified body structures 01/14/2022   Chronic pain 01/14/2022   Colon cancer screening 01/14/2022   Dysphagia 01/14/2022   Family history of malignant neoplasm of digestive organs 01/14/2022   Hyperglycemia due to type Olivia diabetes mellitus ([place]) 01/14/2022   Hypertension 01/14/2022   Meralgia paresthetica 01/14/2022   Mixed hyperlipidemia 01/14/2022   Morbid obesity ([place]) 01/14/2022   Overweight 01/14/2022   Pure hypercholesterolemia 01/14/2022   Spinal cord disease ([place]) 01/14/2022   Chronic cough 12/01/2021   Diabetes ([place]) 07/08/2021   Glaucoma 07/08/2021   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   Visual field defect of left eye 07/08/2021   Debility 06/16/2021   Hypokalemia    Seizures (HCC)    Benign essential HTN    Controlled type Olivia diabetes mellitus with hyperglycemia, without  long-term current use of insulin ([place])    Stridor 06/05/2021   Status post tracheostomy ([place])    Acute respiratory failure with hypoxia ([place])    Status epilepticus ([place])    Brain tumor ([place]) 05/18/2021    PCP: Seward Carol, MD   REFERRING PROVIDER: Seward Carol, MD   REFERRING DIAG: Diagnosis M48.061 (ICD-10-CM) - Spinal stenosis, lumbar region without neurogenic claudication   Rationale for Evaluation and Treatment: Rehabilitation  THERAPY DIAG:  Muscle weakness (generalized)  Difficulty in walking, not elsewhere classified  Unsteadiness on feet  Other low back pain  ONSET DATE: 07/06/2022   SUBJECTIVE:  SUBJECTIVE STATEMENT:  doing pretty good   PERTINENT HISTORY:  Meningioma, removed, R BG infarct, old  PAIN:  Are you having pain? Yes Olivia/10 posterior legs; 0/10 in back  PRECAUTIONS: None  WEIGHT BEARING RESTRICTIONS: No  FALLS:  Has patient fallen in last 6 months? No  LIVING ENVIRONMENT: Lives with: lives alone Lives in: House/apartment Stairs: Yes: External: Olivia steps; on right going up Has following equipment at home: Single point cane and Walker - Olivia wheeled  OCCUPATION: Receptionist, sitting, full time  PLOF: Independent  PATIENT GOALS: Decrease pain, be able to move around without pain   OBJECTIVE:   DIAGNOSTIC FINDINGS:  Lumbar spinal stenosis  PATIENT SURVEYS:  FOTO 40.26  SCREENING FOR RED FLAGS: Bowel or bladder incontinence: No Spinal tumors: No Cauda equina syndrome: No Compression fracture: No Abdominal aneurysm: No  COGNITION: Overall cognitive status: Within functional limits for tasks assessed    MUSCLE LENGTH: Hamstrings: Right 82 deg; Left 84 deg B hip flexors appear tight  POSTURE: increased lumbar lordosis, decreased thoracic  kyphosis, right pelvic obliquity, and R shoulder elevated RLE longer than L 3/4"  PALPATION: N TTP in back or buttocks or legs  LUMBAR ROM:   AROM eval 09/02/22 09/13/22  Flexion WNL WNLs WFL  Extension Severely limited Decreased 90% Decreased 50%  Right lateral flexion WNL WFLs WFL  Left lateral flexion 3" above knee WFLS WFL  Right rotation 30% WFLs WFL  Left rotation 40% WFLs WFL   (Blank rows = not tested)  LOWER EXTREMITY ROM:    WNL B  LOWER EXTREMITY MMT:    MMT Right eval Left eval RT/Left 09/02/22 RT/Left 10/07/22 Rt/Lt 10/18/22  Hip flexion 3+ 3+ 4/4 4/4 4/4  Hip extension 3- 3- 4/4 4+/4+ 3+/3+   Hip abduction 3 3 4/4 4+/4+ 3+/3+ without compensation in sidelying  Hip adduction       Hip internal rotation       Hip external rotation       Knee flexion 4- 4-  4+/4+ 5/5  Knee extension 3+ 3+  4/4 4+/4+  Ankle dorsiflexion 4 4  4/4 4+/4+  Ankle plantarflexion       Ankle inversion       Ankle eversion        (Blank rows = not tested)  LUMBAR SPECIAL TESTS:  Straight leg raise test: Negative and Slump test: Negative  FUNCTIONAL TESTS:  5 times sit to stand: 19.26    09/02/22 12.3 sec 10/18/22 13.9 sec Timed up and go (TUG): 14.50   09/02/22 no AD 8.1 sec Functional gait assessment: TBD  10/18/22 15/30    GAIT: Distance walked: at least Olivia laps around gym  Assistive device utilized: Single point cane and it was set too tall, but she declined to lower it. Level of assistance: Modified independence Comments: Patient declined to adjust cane height.  TODAY'S TREATMENT:  DATE:  12/16/22 Amb 28min outside no AD CGA, instability with 1 LOB. Lateral sway and trouble up curbs, fatigued quickly in LE Nustep L 6 46min LE only STS on airex 10 x HS curl 25# Olivia sets 10 Knee ext 10# Olivia sets 10 HHA stepping over airex SW 10 x each, the fwd 10 x each  foot Modified sit up Olivia sets 10 with wt ball       12/09/22  Nustep L 6 85min Side stepping in and out of 6 inch box 10 x 3# ankle wts- HHA needed , compensations with LLE to clear box HHA 3# fwd stepping in/out box 20 x alt- cuing for LLE as she tends to compensate with rotation 3# alt step tap 20 x 6 inch 1 x with single HHA, 1 x with no UE support but CGA Standing on airex HHA 3# ankle wts alt hip flex,ext and abd. 10x abd each leg Olivia sets  ( BIL hip weakness and fatigue esp abd and Left>RT) Wt ball modified sit ups 15 x Wt ball seated press with core engaged 10 x chest press and OH Leg Press 30# feet 3 way 10 x each    12/06/22: Nustep L 6 68min tband hip ext,flex and abd 10 x BIL ( weakness with abduction)  tband HS curl Olivia sets 10 seated green HS curl 25# Olivia sets 10 Knee ext 10# Olivia sets 10 Standing for heel /toe raises at sink, 10 reps x Olivia Standing at sink for unilateral high marches, 10 reps x Olivia Feet on green physioball bridge Olivia sets 10,  KTC and obl 10 x each Iso abdominals with red physioball 10 reps,  5 sec holds , Olivia sets Seated for pelvic tilts with marching Olivia x 10 Rows, lat pul downs 15 #, 1 set each of 10 reps.  12/02/22 Nustep L 6 63min Resisted gait fwd,back and laterally 30# 5 x each with 4 inch taps Red tband hip ext,flex and abd 10 x BIL ( weakness with abduction) Red tband HS curl Olivia sets 10 standing HS curl 25# Olivia sets 10 Knee ext 10# Olivia sets 10 Seated clams and hip flex blue tband, hip abd band at ankles green Olivia sets 10 Feet on ball bridge, KTC and obl 15 x each Iso abdominals with red ball   Olivia/29/24 Nustep L 6 7 min Lumbar ROM WFLS MMT LE 4+/5 except hip flex 4/5 but overall noted func fatigue with ex Black Tband trunk flex and ext 20 x Seated Rows and Lats 25# Olivia sets 10 Standing on airex marching 20 x Olivia sets CGA 6 inch step up RT down RT 10 x then 10 x Left- UE assist ( noted left leg weaker than RT) CGA HHA side stepping over foam rolls on airex- min  A Isometric upper abs and lower abdominals 10 x each 5# step tap 10 x BIL fwd and laterally, then alt 20 x ( left leg notably weaker) unable to do lateral with wt on HS curl 25# Olivia sets 10 Knee ext 10# Olivia set s10     Olivia/19/24:  Nustep L 6 86min 3 way plyo ball rolls outs 10 each with large blue ball  Feet on ball bridge, KTC and obl 15 x each Iso abdominal 15 x Resisted trunk rotation 15 x Clams and hip flexion tband Olivia sets 10 in supine hook lying Seated pelvic tilt with marching Black tband trunk flex and ext Olivia sets 10 Seated row and  lats Olivia sets 10 x with green theraband PROM LE and trunk in supine, therapist providing gentle overpressure and prolonged stretch  Olivia/15/24 Nustep L 6 71min 3 way plyo ball rolls outs 10 each Feet on ball bridge, KTC and obl 15 x each Iso abdominal 15 x Resisted trunk rotation 15 x Clams and hip flexion tband Olivia sets 10 Black tband trunk flex and ext Olivia sets 10 Seated row and lats Olivia sets 10 PROM LE and trunk   Olivia/8/24 Nustep L 5  55min B Ue's and LE's Modified sit ups 10 x STS with wt ball chest press 10 x Standing wt ball trunk rotation 12 x each Seated wt ball diagonals 10 each way 3 way plyoball roll outs 10 each way Supine clams with green tband and ppt Olivia sets 10 Supine hip flexion green tband and ppt Olivia sets 10 Feet on ball bridge, obl and KTC Iso abdominals upper and lower with ball PROM LE and trunk     Nustep L 5  25min B Ue's and LE's Black bar plantarflexor stretch 30 sec each side, 3 sets  Black bar DF and PF 20 x Feet on ball bridge Olivia sets 10, obl 20, KTC 10x Supine ppt 10 x hold 3 sec Supine isometric knee to chest 5 sec holds, 10 x each side Supine clams with green tband and ppt Olivia sets 10 Supine hip flexion green tband and ppt Olivia sets 10 Sidelying B for hip abduction with leg straight, therapist lightly supported leg to assist with eccentric lowering.  Olivia sets each 12 reps  PROM and stretches to back and LE with neural tension  glides Seated tailor's stretch 30 sec hold 3 reps each leg   27-Nov-2022 Nustep L 4 21min  Mod dead lift without wt 10x Black bar DF and PF 20 x Feet on ball bridge Olivia sets 10, obl 20, KTC 10x Isometric abdominals 15 x hold 3sec Supine ppt 15 x hold 3 sec Supine clams with green tband and ppt Olivia sets 10 Supine hip flexion green tband and ppt Olivia sets 10 PROM and stretches to back and LE with neural tension glides  10/25/22 NuStep L3 x 6 minutes Supine over Blue physioball- bridge x 10, repeat with hip IR, B KTC , but reported pain moving into the backs of her legs, bridge with side to side roll- 10 each Supine SLR x 10 each leg, clamshells,  Supine stretch B KTC, LTR, ITB stretch Side lying hip abd, 10 reps each side Seated HS stretch, 3 x 15 seconds each leg  10/21/22 Nustep L 5 34min LE only Resisted gait 30 # 4 way 5 x each TM ( with UE support) 4 min- working on increased stride and lifter steps .7 mph HS curl 25# Olivia sets 10 Knee ext 10# 3 sets 5 ( last week unable to do 10#) Standing red tband hip flex,ext and abd 10 x each ( pulling sensation in post leg with extension stopped when mvmt stopped, abd weak) Step ups 6 inch 10 x fwd, 10 x laterally with UE support- left leg weaker with step ups STS with wt ball press Olivia sets 10  10/18/22  Nustep L5 x 5 min Hamstring stretch x30 sec R&L Figure 4 stretch 2x30 sec R&L Bridging x10 Forward tap 4" step 2x10 Side tap 4" step 2x10 R&L See assessments above  10/14/22 Nustep L 5 53min Amb 3 laps without AD 1 min 46 sec Obstacle course stepping on and  over object- CG-min A with HHA needed for 6 inch curb and large roll Obstacle course step over step Various 6 inch step ups ex- Olivia HHA then 1 HHA Resisted gait 30 # 4 way 4 x each Seated row and lat pull down 20# Olivia sets 12 Black tband trunk ext Olivia sets 10  10/07/22 Nustep L 5 6 min MMT assessed STS wt ball chest press 10 x Standing wt ball rotation 10 each and 10 x OH press HS curl 20# Olivia  sets 10 Knee ext 5# Olivia sets 10 ( 10# was too heavy) Seated row and lat pull down 20# Olivia sets 12 Black tband trunk ext Olivia sets 10 Amb 3 laps without AD about 350 feet  1 min 42 sec Forward and lateral 4 in step ups x 10 each bilaterally HHA x Olivia    10/04/22 NuStep L4 x 6 min S2S holding yellow ball 2x10  Forward and lateral 4 in step ups x 10 each bilaterally HHA x Olivia  HS curls green 2x12 LAQ 2lb 2x12 each  Standing march 2lb 2x10 each Seated row and lats Olivia sets 12 20#   09/16/22 Bike L 4 Olivia min then decreased L Olivia 4 min STS on airex, slight elevated mat 10x with CGA- some instability HHA on airex 3# ankle wts 10 x SL then 10 x alt Marching,hip flex SL ,hip abd and hip ext HHA marching fwd and back 10 feet Olivia x, laterally 10 feet Olivia x Standing HS curl 3# Olivia sets 10 Wt ball trunk ext,rotation,chest press and OH 10 x each Amb 360 feet without AD SBA Olivia min 30 sec Seated row and lats Olivia sets 12 20# Hamstring Curls 20lb 2x12  Leg Ext 5lb 2x12.    09/13/22 NuStep L5 x5 min Foto Gait W/ SPC 360 feet reports some stinging int he back of her legs Hamstring Curls 20lb 2x12  Leg Ext 5lb 2x12. S2S holding red ball 2x10   09/06/22 NuStep L 5 x 6 min  Hamstring Curls 20lb 3x10  Leg Ext 5lb 3x10 Shoulder Ext 5lb 2x10  Standing rows 10lb 2x10 Sit to stand 2x10  Supine bridges 2x10 PROM/stretching LE and trunk   PATIENT EDUCATION:  Education details: POC Person educated: Patient Education method: Explanation Education comprehension: verbalized understanding  HOME EXERCISE PROGRAM:  [id number]  ASSESSMENT:  CLINICAL IMPRESSION: The patient is attending skilled physical therapy to address B lower leg pain and dysfunction related to spinal stenosis. More recent onset of B thigh numbness which is intermittent.  Her MD has ordered MRI due to this new Sx. Gait with + trendelenberg L.  Patient able to complete numerous difference activity challenges to improve her LE strength.  Pt benefits  from continued PT to address above deficits. Goals assessed today OBJECTIVE IMPAIRMENTS: Abnormal gait, decreased activity tolerance, decreased balance, decreased coordination, decreased endurance, decreased mobility, difficulty walking, decreased ROM, decreased strength, increased muscle spasms, impaired flexibility, improper body mechanics, postural dysfunction, and pain.   ACTIVITY LIMITATIONS: carrying, lifting, bending, standing, squatting, sleeping, stairs, transfers, and locomotion level  PARTICIPATION LIMITATIONS: meal prep, cleaning, laundry, interpersonal relationship, driving, shopping, and occupation  PERSONAL FACTORS: Age, Past/current experiences, and 1 comorbidity: Meningioma  are also affecting patient's functional outcome.   REHAB POTENTIAL: Good  CLINICAL DECISION MAKING: Evolving/moderate complexity  EVALUATION COMPLEXITY: Moderate   GOALS: Goals reviewed with patient? Yes  SHORT TERM GOALS: Target date: 08/23/2022  I with basic HEP Baseline: Goal status: met 08/26/22  PRIOR  LONG TERM GOALS: Target date: 10/18/2022  I with final HEP Baseline:  Goal status:  On going 10/07/22  10/14/22 [email or web address]  Olivia/1/24 ongoing Olivia/8/24 on going  Olivia/16/24 evolving Olivia/29/24  Olivia.  Increase FOTO score to at least 52 Baseline: 40 Goal status: Met  09/13/22  3.  Decrease TUG14.5 time to < 12 Baseline:  Goal status: 08/26/22 with SPC 9.58 sec MET  4.  Decrease 5 x STS to < 12 sec Baseline: 19 Goal status: 08/26/22 12.47sec progressing. 09/02/22 MET  5.  Patient will walk x at least 300' with LRAD, MI, no pain in legs Baseline:  Goal status: On going 09/13/22. 10/07/22 on going 10/14/22 distance met but pain Olivia/1/24 pain is an issue  Distance met but pain Olivia/8/24 Olivia/16/24 on going Olivia/29/24 progressing  6.  Assess and update sitting posture in her workspace to provide adequate trunk support. Baseline:  Goal status: Progressing 09/13/22, progressing 10/07/22 MET 10/14/22  REVISED LONG TERM  GOALS: Target date: 12/13/2022  I with final HEP for return to community wellness/gym (per pt goals) Baseline:  Goal status:  On going 10/07/22  10/14/22 evolving Olivia/8/24 and Olivia/16/24. Evolving Olivia/29/24 and 12/01/08. Continue to adjust and modify as needed 12/09/22  Olivia.  Patient will walk x at least 500' with LRAD, MI, no pain in legs Baseline: Had been able to do 300' with LRAD but had pain in posterior legs Goal status: Revised goal Olivia/8/24 On going Olivia/29/24 progressing 12/02/22.  12/09/22 Progressing pain limits distance esp with community reentry 12/16/22 progressing  3.  Pt will demo at least 4+/5 hip abd in sidelying for improved standing stability Baseline:  Goal status: Revised goal  ongoing Olivia/1/24 Progressing Olivia/8/24 and Olivia/16/24. Olivia/29/24 MEt except hip flex 4/5 and fatigues quickly overall 12/02/22 fatigue quickly and abd weakness in hip snoted 12/09/22 func hip abd weakness  12/16/22 progressing  4.  Pt will have improved FGA to >/=22/30 to demo decreased fall risk with amb Baseline:  Goal status: New  12/09/22 progressing  12/16/22 progressing  PLAN:  PT FREQUENCY: 1-2x/week  PT DURATION: 8 weeks  PLANNED INTERVENTIONS: Therapeutic exercises, Therapeutic activity, Neuromuscular re-education, Balance training, Gait training, Patient/Family education, Self Care, Joint mobilization, Stair training, Dry Needling, Electrical stimulation, Cryotherapy, Moist heat, Ionotophoresis 4mg /ml Dexamethasone, and Manual therapy.  PLAN FOR NEXT SESSION: progress with core hip stability. Balance and endurance  German Manke,ANGIE, PTA 12/16/2022, 5:04 PM [facility] at [place]. [place], [place], [address] Phone: [phone]   Fax:  [facility] at [place]. [place], [place], [address] Phone: [phone]   Fax:  [facility] at [place]. [place], [place], [address] Phone: [phone]   Fax:  [phone]  Patient Details  Name: Olivia Shah MRN: [id number] Date of Birth: 08/14/1956 Referring Provider:  Seward Carol, MD  Encounter Date: 12/16/2022   Laqueta Carina, PTA 12/16/2022, 5:04 PM  [facility] at [place]. [place], [place], [address] Phone: [phone]   Fax:  [facility] at [place]. [place], [place], [address] Phone: [phone]   Fax:  [phone]  Patient Details  Name: Olivia Shah MRN: [id number] Date of Birth: 10-08-1955 Referring Provider:  Seward Carol, MD  Encounter Date: 12/16/2022   Laqueta Carina, PTA 12/16/2022, 5:04 PM  [facility] at  [place]. [place], [place], [address] Phone: [phone]   Fax:  [facility] at [place]. [place], [place], [address] Phone: [phone]   Fax:  [phone]

## 2022-12-20 ENCOUNTER — Encounter: Payer: Self-pay | Admitting: Physical Therapy

## 2022-12-20 ENCOUNTER — Ambulatory Visit: Payer: Medicare Other | Admitting: Physical Therapy

## 2022-12-20 DIAGNOSIS — R2681 Unsteadiness on feet: Secondary | ICD-10-CM

## 2022-12-20 DIAGNOSIS — G8929 Other chronic pain: Secondary | ICD-10-CM

## 2022-12-20 DIAGNOSIS — M6281 Muscle weakness (generalized): Secondary | ICD-10-CM | POA: Diagnosis not present

## 2022-12-20 DIAGNOSIS — R262 Difficulty in walking, not elsewhere classified: Secondary | ICD-10-CM

## 2022-12-20 NOTE — Therapy (Signed)
[place] TREATMENT       Patient Name: Olivia Shah MRN: [id number] DOB:15-May-1956, 67 y.o., female Today's Date: 12/20/2022   PT End of Session - 12/20/22 1058     Visit Number 28    Date for PT Re-Evaluation 12/24/22    PT Start Time 1058    PT Stop Time 1145    PT Time Calculation (min) 47 min    Activity Tolerance Patient tolerated treatment well    Behavior During Therapy [facility] for tasks assessed/performed                Past Medical History:  Diagnosis Date   Arthritis    Diabetes mellitus without complication ([place])    Hypertension    Past Surgical History:  Procedure Laterality Date   APPLICATION OF CRANIAL NAVIGATION N/A 05/18/2021   Procedure: APPLICATION OF CRANIAL NAVIGATION;  Surgeon: Judith Part, MD;  Location: [place];  Service: Neurosurgery;  Laterality: N/A;   BREAST BIOPSY Right    CHOLECYSTECTOMY     CRANIOTOMY Right 05/18/2021   Procedure: Right craniotomy for tumor resection;  Surgeon: Judith Part, MD;  Location: [place];  Service: Neurosurgery;  Laterality: Right;   EYE SURGERY     as a child   HERNIA REPAIR     Patient Active Problem List   Diagnosis Date Noted   Abnormal findings on diagnostic imaging of other specified body structures 01/14/2022   Chronic pain 01/14/2022   Colon cancer screening 01/14/2022   Dysphagia 01/14/2022   Family history of malignant neoplasm of digestive organs 01/14/2022   Hyperglycemia due to type 2 diabetes mellitus ([place]) 01/14/2022   Hypertension 01/14/2022   Meralgia paresthetica 01/14/2022   Mixed hyperlipidemia 01/14/2022   Morbid obesity ([place]) 01/14/2022   Overweight 01/14/2022   Pure hypercholesterolemia 01/14/2022   Spinal cord disease ([place]) 01/14/2022   Chronic cough 12/01/2021   Diabetes ([place]) 07/08/2021   Glaucoma 07/08/2021   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   Visual field defect of left eye 07/08/2021   Debility 06/16/2021   Hypokalemia     Seizures (HCC)    Benign essential HTN    Controlled type 2 diabetes mellitus with hyperglycemia, without long-term current use of insulin ([place])    Stridor 06/05/2021   Status post tracheostomy ([place])    Acute respiratory failure with hypoxia ([place])    Status epilepticus ([place])    Brain tumor ([place]) 05/18/2021    PCP: Seward Carol, MD   REFERRING PROVIDER: Seward Carol, MD   REFERRING DIAG: Diagnosis M48.061 (ICD-10-CM) - Spinal stenosis, lumbar region without neurogenic claudication   Rationale for Evaluation and Treatment: Rehabilitation  THERAPY DIAG:  Muscle weakness (generalized)  Difficulty in walking, not elsewhere classified  Unsteadiness on feet  Chronic bilateral low back pain with bilateral sciatica  ONSET DATE: 07/06/2022   SUBJECTIVE:  SUBJECTIVE STATEMENT:  All right except for the legs. MRI scheduled for Wednesday   PERTINENT HISTORY:  Meningioma, removed, R BG infarct, old  PAIN:  Are you having pain? Yes 5/10 posterior legs; 0/10 in back  PRECAUTIONS: None  WEIGHT BEARING RESTRICTIONS: No  FALLS:  Has patient fallen in last 6 months? No  LIVING ENVIRONMENT: Lives with: lives alone Lives in: House/apartment Stairs: Yes: External: 2 steps; on right going up Has following equipment at home: Single point cane and Walker - 2 wheeled  OCCUPATION: Receptionist, sitting, full time  PLOF: Independent  PATIENT GOALS: Decrease pain, be able to move around without pain   OBJECTIVE:   DIAGNOSTIC FINDINGS:  Lumbar spinal stenosis  PATIENT SURVEYS:  FOTO 40.26  SCREENING FOR RED FLAGS: Bowel or bladder incontinence: No Spinal tumors: No Cauda equina syndrome: No Compression fracture: No Abdominal aneurysm: No  COGNITION: Overall cognitive status: Within  functional limits for tasks assessed    MUSCLE LENGTH: Hamstrings: Right 82 deg; Left 84 deg B hip flexors appear tight  POSTURE: increased lumbar lordosis, decreased thoracic kyphosis, right pelvic obliquity, and R shoulder elevated RLE longer than L 3/4"  PALPATION: N TTP in back or buttocks or legs  LUMBAR ROM:   AROM eval 09/02/22 09/13/22  Flexion WNL WNLs WFL  Extension Severely limited Decreased 90% Decreased 50%  Right lateral flexion WNL WFLs WFL  Left lateral flexion 3" above knee WFLS WFL  Right rotation 30% WFLs WFL  Left rotation 40% WFLs WFL   (Blank rows = not tested)  LOWER EXTREMITY ROM:    WNL B  LOWER EXTREMITY MMT:    MMT Right eval Left eval RT/Left 09/02/22 RT/Left 10/07/22 Rt/Lt 10/18/22  Hip flexion 3+ 3+ 4/4 4/4 4/4  Hip extension 3- 3- 4/4 4+/4+ 3+/3+   Hip abduction 3 3 4/4 4+/4+ 3+/3+ without compensation in sidelying  Hip adduction       Hip internal rotation       Hip external rotation       Knee flexion 4- 4-  4+/4+ 5/5  Knee extension 3+ 3+  4/4 4+/4+  Ankle dorsiflexion 4 4  4/4 4+/4+  Ankle plantarflexion       Ankle inversion       Ankle eversion        (Blank rows = not tested)  LUMBAR SPECIAL TESTS:  Straight leg raise test: Negative and Slump test: Negative  FUNCTIONAL TESTS:  5 times sit to stand: 19.26    09/02/22 12.3 sec 10/18/22 13.9 sec Timed up and go (TUG): 14.50   09/02/22 no AD 8.1 sec Functional gait assessment: TBD  10/18/22 15/30    GAIT: Distance walked: at least 2 laps around gym  Assistive device utilized: Single point cane and it was set too tall, but she declined to lower it. Level of assistance: Modified independence Comments: Patient declined to adjust cane height.  TODAY'S TREATMENT:  DATE: 12/20/22 NuStep L5 x7 min Rows & Lats 15lb 2x10 Shoulder Ext 5lb 2x10 Hamstring   curls 15lb 2x15 Leg Ext 5lb 2x15 S2S holding red ball 2x10 Side step on balance bean in // bars 4in step ups 2 rails x5 each   12/16/22 Amb 74min outside no AD CGA, instability with 1 LOB. Lateral sway and trouble up curbs, fatigued quickly in LE Nustep L 6 55min LE only STS on airex 10 x HS curl 25# 2 sets 10 Knee ext 10# 2 sets 10 HHA stepping over airex SW 10 x each, the fwd 10 x each foot Modified sit up 2 sets 10 with wt ball   12/09/22  Nustep L 6 15min Side stepping in and out of 6 inch box 10 x 3# ankle wts- HHA needed , compensations with LLE to clear box HHA 3# fwd stepping in/out box 20 x alt- cuing for LLE as she tends to compensate with rotation 3# alt step tap 20 x 6 inch 1 x with single HHA, 1 x with no UE support but CGA Standing on airex HHA 3# ankle wts alt hip flex,ext and abd. 10x abd each leg 2 sets  ( BIL hip weakness and fatigue esp abd and Left>RT) Wt ball modified sit ups 15 x Wt ball seated press with core engaged 10 x chest press and OH Leg Press 30# feet 3 way 10 x each    12/06/22: Nustep L 6 57min tband hip ext,flex and abd 10 x BIL ( weakness with abduction)  tband HS curl 2 sets 10 seated green HS curl 25# 2 sets 10 Knee ext 10# 2 sets 10 Standing for heel /toe raises at sink, 10 reps x 2 Standing at sink for unilateral high marches, 10 reps x 2 Feet on green physioball bridge 2 sets 10,  KTC and obl 10 x each Iso abdominals with red physioball 10 reps,  5 sec holds , 2 sets Seated for pelvic tilts with marching 2 x 10 Rows, lat pul downs 15 #, 1 set each of 10 reps.  12/02/22 Nustep L 6 47min Resisted gait fwd,back and laterally 30# 5 x each with 4 inch taps Red tband hip ext,flex and abd 10 x BIL ( weakness with abduction) Red tband HS curl 2 sets 10 standing HS curl 25# 2 sets 10 Knee ext 10# 2 sets 10 Seated clams and hip flex blue tband, hip abd band at ankles green 2 sets 10 Feet on ball bridge, KTC and obl 15 x each Iso abdominals with  red ball   11/25/22 Nustep L 6 7 min Lumbar ROM WFLS MMT LE 4+/5 except hip flex 4/5 but overall noted func fatigue with ex Black Tband trunk flex and ext 20 x Seated Rows and Lats 25# 2 sets 10 Standing on airex marching 20 x 2 sets CGA 6 inch step up RT down RT 10 x then 10 x Left- UE assist ( noted left leg weaker than RT) CGA HHA side stepping over foam rolls on airex- min A Isometric upper abs and lower abdominals 10 x each 5# step tap 10 x BIL fwd and laterally, then alt 20 x ( left leg notably weaker) unable to do lateral with wt on HS curl 25# 2 sets 10 Knee ext 10# 2 set s10     11/15/22:  Nustep L 6 66min 3 way plyo ball rolls outs 10 each with large blue ball  Feet on ball bridge, KTC and obl 15  x each Iso abdominal 15 x Resisted trunk rotation 15 x Clams and hip flexion tband 2 sets 10 in supine hook lying Seated pelvic tilt with marching Black tband trunk flex and ext 2 sets 10 Seated row and lats 2 sets 10 x with green theraband PROM LE and trunk in supine, therapist providing gentle overpressure and prolonged stretch  11/11/22 Nustep L 6 80min 3 way plyo ball rolls outs 10 each Feet on ball bridge, KTC and obl 15 x each Iso abdominal 15 x Resisted trunk rotation 15 x Clams and hip flexion tband 2 sets 10 Black tband trunk flex and ext 2 sets 10 Seated row and lats 2 sets 10 PROM LE and trunk   11/04/22 Nustep L 5  45min B Ue's and LE's Modified sit ups 10 x STS with wt ball chest press 10 x Standing wt ball trunk rotation 12 x each Seated wt ball diagonals 10 each way 3 way plyoball roll outs 10 each way Supine clams with green tband and ppt 2 sets 10 Supine hip flexion green tband and ppt 2 sets 10 Feet on ball bridge, obl and KTC Iso abdominals upper and lower with ball PROM LE and trunk     Nustep L 5  46min B Ue's and LE's Black bar plantarflexor stretch 30 sec each side, 3 sets  Black bar DF and PF 20 x Feet on ball bridge 2 sets 10, obl 20,  KTC 10x Supine ppt 10 x hold 3 sec Supine isometric knee to chest 5 sec holds, 10 x each side Supine clams with green tband and ppt 2 sets 10 Supine hip flexion green tband and ppt 2 sets 10 Sidelying B for hip abduction with leg straight, therapist lightly supported leg to assist with eccentric lowering.  2 sets each 12 reps  PROM and stretches to back and LE with neural tension glides Seated tailor's stretch 30 sec hold 3 reps each leg   11/07/22 Nustep L 4 62min  Mod dead lift without wt 10x Black bar DF and PF 20 x Feet on ball bridge 2 sets 10, obl 20, KTC 10x Isometric abdominals 15 x hold 3sec Supine ppt 15 x hold 3 sec Supine clams with green tband and ppt 2 sets 10 Supine hip flexion green tband and ppt 2 sets 10 PROM and stretches to back and LE with neural tension glides  10/25/22 NuStep L3 x 6 minutes Supine over Blue physioball- bridge x 10, repeat with hip IR, B KTC , but reported pain moving into the backs of her legs, bridge with side to side roll- 10 each Supine SLR x 10 each leg, clamshells,  Supine stretch B KTC, LTR, ITB stretch Side lying hip abd, 10 reps each side Seated HS stretch, 3 x 15 seconds each leg  10/21/22 Nustep L 5 1min LE only Resisted gait 30 # 4 way 5 x each TM ( with UE support) 4 min- working on increased stride and lifter steps .7 mph HS curl 25# 2 sets 10 Knee ext 10# 3 sets 5 ( last week unable to do 10#) Standing red tband hip flex,ext and abd 10 x each ( pulling sensation in post leg with extension stopped when mvmt stopped, abd weak) Step ups 6 inch 10 x fwd, 10 x laterally with UE support- left leg weaker with step ups STS with wt ball press 2 sets 10  10/18/22  Nustep L5 x 5 min Hamstring stretch x30 sec R&L Figure 4  stretch 2x30 sec R&L Bridging x10 Forward tap 4" step 2x10 Side tap 4" step 2x10 R&L See assessments above  10/14/22 Nustep L 5 56min Amb 3 laps without AD 1 min 46 sec Obstacle course stepping on and over object-  CG-min A with HHA needed for 6 inch curb and large roll Obstacle course step over step Various 6 inch step ups ex- 2 HHA then 1 HHA Resisted gait 30 # 4 way 4 x each Seated row and lat pull down 20# 2 sets 12 Black tband trunk ext 2 sets 10  10/07/22 Nustep L 5 6 min MMT assessed STS wt ball chest press 10 x Standing wt ball rotation 10 each and 10 x OH press HS curl 20# 2 sets 10 Knee ext 5# 2 sets 10 ( 10# was too heavy) Seated row and lat pull down 20# 2 sets 12 Black tband trunk ext 2 sets 10 Amb 3 laps without AD about 350 feet  1 min 42 sec Forward and lateral 4 in step ups x 10 each bilaterally HHA x 2    10/04/22 NuStep L4 x 6 min S2S holding yellow ball 2x10  Forward and lateral 4 in step ups x 10 each bilaterally HHA x 2  HS curls green 2x12 LAQ 2lb 2x12 each  Standing march 2lb 2x10 each Seated row and lats 2 sets 12 20#   09/16/22 Bike L 4 2 min then decreased L 2 4 min STS on airex, slight elevated mat 10x with CGA- some instability HHA on airex 3# ankle wts 10 x SL then 10 x alt Marching,hip flex SL ,hip abd and hip ext HHA marching fwd and back 10 feet 2 x, laterally 10 feet 2 x Standing HS curl 3# 2 sets 10 Wt ball trunk ext,rotation,chest press and OH 10 x each Amb 360 feet without AD SBA 2 min 30 sec Seated row and lats 2 sets 12 20# Hamstring Curls 20lb 2x12  Leg Ext 5lb 2x12.    09/13/22 NuStep L5 x5 min Foto Gait W/ SPC 360 feet reports some stinging int he back of her legs Hamstring Curls 20lb 2x12  Leg Ext 5lb 2x12. S2S holding red ball 2x10   09/06/22 NuStep L 5 x 6 min  Hamstring Curls 20lb 3x10  Leg Ext 5lb 3x10 Shoulder Ext 5lb 2x10  Standing rows 10lb 2x10 Sit to stand 2x10  Supine bridges 2x10 PROM/stretching LE and trunk   PATIENT EDUCATION:  Education details: POC Person educated: Patient Education method: Explanation Education comprehension: verbalized understanding  HOME EXERCISE PROGRAM:   [id number]  ASSESSMENT:  CLINICAL IMPRESSION: The patient is attending skilled physical therapy to address B lower leg pain and dysfunction related to spinal stenosis. Session focused postural core strength. Some core weakness present with shoulder extensions. Mod cues to maintain sequencing with step ups. Bilateral UE needed with side steps. Pt benefits from continued PT to address above deficits. Goals assessed today  OBJECTIVE IMPAIRMENTS: Abnormal gait, decreased activity tolerance, decreased balance, decreased coordination, decreased endurance, decreased mobility, difficulty walking, decreased ROM, decreased strength, increased muscle spasms, impaired flexibility, improper body mechanics, postural dysfunction, and pain.   ACTIVITY LIMITATIONS: carrying, lifting, bending, standing, squatting, sleeping, stairs, transfers, and locomotion level  PARTICIPATION LIMITATIONS: meal prep, cleaning, laundry, interpersonal relationship, driving, shopping, and occupation  PERSONAL FACTORS: Age, Past/current experiences, and 1 comorbidity: Meningioma  are also affecting patient's functional outcome.   REHAB POTENTIAL: Good  CLINICAL DECISION MAKING: Evolving/moderate complexity  EVALUATION COMPLEXITY: Moderate  GOALS: Goals reviewed with patient? Yes  SHORT TERM GOALS: Target date: 08/23/2022  I with basic HEP Baseline: Goal status: met 08/26/22  PRIOR LONG TERM GOALS: Target date: 10/18/2022  I with final HEP Baseline:  Goal status:  On going 10/07/22  10/14/22 [email or web address]  10/28/22 ongoing 11/04/22 on going  11/12/22 evolving 11/25/22  2.  Increase FOTO score to at least 52 Baseline: 40 Goal status: Met  09/13/22  3.  Decrease TUG14.5 time to < 12 Baseline:  Goal status: 08/26/22 with SPC 9.58 sec MET  4.  Decrease 5 x STS to < 12 sec Baseline: 19 Goal status: 08/26/22 12.47sec progressing. 09/02/22 MET  5.  Patient will walk x at least 300' with LRAD, MI, no pain in legs Baseline:  Goal  status: On going 09/13/22. 10/07/22 on going 10/14/22 distance met but pain 10/28/22 pain is an issue  Distance met but pain 11/04/22 11/12/22 on going 11/25/22 progressing  6.  Assess and update sitting posture in her workspace to provide adequate trunk support. Baseline:  Goal status: Progressing 09/13/22, progressing 10/07/22 MET 10/14/22  REVISED LONG TERM GOALS: Target date: 12/13/2022  I with final HEP for return to community wellness/gym (per pt goals) Baseline:  Goal status:  On going 10/07/22  10/14/22 evolving 11/04/22 and 11/12/22. Evolving 11/25/22 and 12/01/08. Continue to adjust and modify as needed 12/09/22  2.  Patient will walk x at least 500' with LRAD, MI, no pain in legs Baseline: Had been able to do 300' with LRAD but had pain in posterior legs Goal status: Revised goal 11/04/22 On going 11/25/22 progressing 12/02/22.  12/09/22 Progressing pain limits distance esp with community reentry 12/16/22 progressing  3.  Pt will demo at least 4+/5 hip abd in sidelying for improved standing stability Baseline:  Goal status: Revised goal  ongoing 10/28/22 Progressing 11/04/22 and 11/12/22. 11/25/22 MEt except hip flex 4/5 and fatigues quickly overall 12/02/22 fatigue quickly and abd weakness in hip snoted 12/09/22 func hip abd weakness  12/16/22 progressing  4.  Pt will have improved FGA to >/=22/30 to demo decreased fall risk with amb Baseline:  Goal status: New  12/09/22 progressing  12/16/22 progressing  PLAN:  PT FREQUENCY: 1-2x/week  PT DURATION: 8 weeks  PLANNED INTERVENTIONS: Therapeutic exercises, Therapeutic activity, Neuromuscular re-education, Balance training, Gait training, Patient/Family education, Self Care, Joint mobilization, Stair training, Dry Needling, Electrical stimulation, Cryotherapy, Moist heat, Ionotophoresis 4mg /ml Dexamethasone, and Manual therapy.  PLAN FOR NEXT SESSION: progress with core hip stability. Balance and endurance     Scot Jun, PTA 12/20/2022, 10:58  AM

## 2022-12-23 ENCOUNTER — Ambulatory Visit: Payer: Medicare Other | Admitting: Physical Therapy

## 2022-12-23 DIAGNOSIS — M6281 Muscle weakness (generalized): Secondary | ICD-10-CM

## 2022-12-23 DIAGNOSIS — R262 Difficulty in walking, not elsewhere classified: Secondary | ICD-10-CM

## 2022-12-23 DIAGNOSIS — G8929 Other chronic pain: Secondary | ICD-10-CM

## 2022-12-23 DIAGNOSIS — R2681 Unsteadiness on feet: Secondary | ICD-10-CM

## 2022-12-23 NOTE — Therapy (Signed)
[facility] TREAT[facility]       Patient Name: Olivia Shah MRN: [id number] DOB:[birth year], 67 y.o., female Today's Date: 12/23/2022   PT End of Session - 12/23/22 1657     Visit Number 29    Date for PT Re-Evaluation 12/24/22    PT Start Time 1700    PT Stop Time 1745    PT Time Calculation (min) 45 min                Past Medical History:  Diagnosis Date   Arthritis    Diabetes mellitus without complication ([place])    Hypertension    Past Surgical History:  Procedure Laterality Date   APPLICATION OF CRANIAL NAVIGATION N/A 05/18/2021   Procedure: APPLICATION OF CRANIAL NAVIGATION;  Surgeon: Judith Part, MD;  Location: [facility];  Service: Neurosurgery;  Laterality: N/A;   BREAST BIOPSY Right    CHOLECYSTECTOMY     CRANIOTOMY Right 05/18/2021   Procedure: Right craniotomy for tumor resection;  Surgeon: Judith Part, MD;  Location: [facility];  Service: Neurosurgery;  Laterality: Right;   EYE SURGERY     as a child   HERNIA REPAIR     Patient Active Problem List   Diagnosis Date Noted   Abnormal findings on diagnostic imaging of other specified body structures 01/14/2022   Chronic pain 01/14/2022   Colon cancer screening 01/14/2022   Dysphagia 01/14/2022   Family history of malignant neoplasm of digestive organs 01/14/2022   Hyperglycemia due to type 2 diabetes mellitus ([place]) 01/14/2022   Hypertension 01/14/2022   Meralgia paresthetica 01/14/2022   Mixed hyperlipidemia 01/14/2022   Morbid obesity ([place]) 01/14/2022   Overweight 01/14/2022   Pure hypercholesterolemia 01/14/2022   Spinal cord disease ([place]) 01/14/2022   Chronic cough 12/01/2021   Diabetes ([place]) 07/08/2021   Glaucoma 07/08/2021   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   Visual field defect of left eye 07/08/2021   Debility 06/16/2021   Hypokalemia    Seizures (HCC)    Benign essential HTN    Controlled type 2 diabetes mellitus with hyperglycemia, without  long-term current use of insulin ([place])    Stridor 06/05/2021   Status post tracheostomy ([place])    Acute respiratory failure with hypoxia ([place])    Status epilepticus ([place])    Brain tumor ([place]) 05/18/2021    PCP: Seward Carol, MD   REFERRING PROVIDER: Seward Carol, MD   REFERRING DIAG: Diagnosis M48.061 (ICD-10-CM) - Spinal stenosis, lumbar region without neurogenic claudication   Rationale for Evaluation and Treatment: Rehabilitation  THERAPY DIAG:  Muscle weakness (generalized)  Difficulty in walking, not elsewhere classified  Unsteadiness on feet  Chronic bilateral low back pain with bilateral sciatica  ONSET DATE: 07/06/2022   SUBJECTIVE:  SUBJECTIVE STATEMENT:  MRI yesterday, MD next week   PERTINENT HISTORY:  Meningioma, removed, R BG infarct, old  PAIN:  Are you having pain? Yes 5/10 posterior legs; 0/10 in back  PRECAUTIONS: None  WEIGHT BEARING RESTRICTIONS: No  FALLS:  Has patient fallen in last 6 months? No  LIVING ENVIRONMENT: Lives with: lives alone Lives in: House/apartment Stairs: Yes: External: 2 steps; on right going up Has following equipment at home: Single point cane and Walker - 2 wheeled  OCCUPATION: Receptionist, sitting, full time  PLOF: Independent  PATIENT GOALS: Decrease pain, be able to move around without pain   OBJECTIVE:   DIAGNOSTIC FINDINGS:  Lumbar spinal stenosis  PATIENT SURVEYS:  FOTO 40.26  SCREENING FOR RED FLAGS: Bowel or bladder incontinence: No Spinal tumors: No Cauda equina syndrome: No Compression fracture: No Abdominal aneurysm: No  COGNITION: Overall cognitive status: Within functional limits for tasks assessed    MUSCLE LENGTH: Hamstrings: Right 82 deg; Left 84 deg B hip flexors appear tight  POSTURE:  increased lumbar lordosis, decreased thoracic kyphosis, right pelvic obliquity, and R shoulder elevated RLE longer than L 3/4"  PALPATION: N TTP in back or buttocks or legs  LUMBAR ROM:   AROM eval 09/02/22 09/13/22  Flexion WNL WNLs WFL  Extension Severely limited Decreased 90% Decreased 50%  Right lateral flexion WNL WFLs WFL  Left lateral flexion 3" above knee WFLS WFL  Right rotation 30% WFLs WFL  Left rotation 40% WFLs WFL   (Blank rows = not tested)  LOWER EXTREMITY ROM:    WNL B  LOWER EXTREMITY MMT:    MMT Right eval Left eval RT/Left 09/02/22 RT/Left 10/07/22 Rt/Lt 10/18/22  Hip flexion 3+ 3+ 4/4 4/4 4/4  Hip extension 3- 3- 4/4 4+/4+ 3+/3+   Hip abduction 3 3 4/4 4+/4+ 3+/3+ without compensation in sidelying  Hip adduction       Hip internal rotation       Hip external rotation       Knee flexion 4- 4-  4+/4+ 5/5  Knee extension 3+ 3+  4/4 4+/4+  Ankle dorsiflexion 4 4  4/4 4+/4+  Ankle plantarflexion       Ankle inversion       Ankle eversion        (Blank rows = not tested)  LUMBAR SPECIAL TESTS:  Straight leg raise test: Negative and Slump test: Negative  FUNCTIONAL TESTS:  5 times sit to stand: 19.26    09/02/22 12.3 sec 10/18/22 13.9 sec Timed up and go (TUG): 14.50   09/02/22 no AD 8.1 sec Functional gait assessment: TBD  10/18/22 15/30    GAIT: Distance walked: at least 2 laps around gym  Assistive device utilized: Single point cane and it was set too tall, but she declined to lower it. Level of assistance: Modified independence Comments: Patient declined to adjust cane height.  TODAY'S TREATMENT:  DATE:  12/23/22 Nustep L 6 7 min 4 inch with airex on top UE support up with RT 10 x then left 10x- noteable weakness on left vs RT Ladder working on taking longer steps CGA  up and back 3 x with 1 LOB Side stepping in ladder  taking wider steps 2 x each way SL HS curl 10# 2 sets 10 each Knee Ext 10# 2 sets 10 ( SL decreased ability weakness and pain/crepitus) Rows & Lats 15lb 2x10 Black tband trunk ext 2 sets 10 In //bars on foam mat- tandem ,side stepping, marching, toe raises    12/20/22 NuStep L5 x7 min Rows & Lats 15lb 2x10 Shoulder Ext 5lb 2x10 Hamstring  curls 15lb 2x15 Leg Ext 5lb 2x15 S2S holding red ball 2x10 Side step on balance bean in // bars 4in step ups 2 rails x5 each   12/16/22 Amb 15min outside no AD CGA, instability with 1 LOB. Lateral sway and trouble up curbs, fatigued quickly in LE Nustep L 6 18min LE only STS on airex 10 x HS curl 25# 2 sets 10 Knee ext 10# 2 sets 10 HHA stepping over airex SW 10 x each, the fwd 10 x each foot Modified sit up 2 sets 10 with wt ball   12/09/22  Nustep L 6 36min Side stepping in and out of 6 inch box 10 x 3# ankle wts- HHA needed , compensations with LLE to clear box HHA 3# fwd stepping in/out box 20 x alt- cuing for LLE as she tends to compensate with rotation 3# alt step tap 20 x 6 inch 1 x with single HHA, 1 x with no UE support but CGA Standing on airex HHA 3# ankle wts alt hip flex,ext and abd. 10x abd each leg 2 sets  ( BIL hip weakness and fatigue esp abd and Left>RT) Wt ball modified sit ups 15 x Wt ball seated press with core engaged 10 x chest press and OH Leg Press 30# feet 3 way 10 x each    12/06/22: Nustep L 6 34min tband hip ext,flex and abd 10 x BIL ( weakness with abduction)  tband HS curl 2 sets 10 seated green HS curl 25# 2 sets 10 Knee ext 10# 2 sets 10 Standing for heel /toe raises at sink, 10 reps x 2 Standing at sink for unilateral high marches, 10 reps x 2 Feet on green physioball bridge 2 sets 10,  KTC and obl 10 x each Iso abdominals with red physioball 10 reps,  5 sec holds , 2 sets Seated for pelvic tilts with marching 2 x 10 Rows, lat pul downs 15 #, 1 set each of 10 reps.  12/02/22 Nustep L 6 62min Resisted  gait fwd,back and laterally 30# 5 x each with 4 inch taps Red tband hip ext,flex and abd 10 x BIL ( weakness with abduction) Red tband HS curl 2 sets 10 standing HS curl 25# 2 sets 10 Knee ext 10# 2 sets 10 Seated clams and hip flex blue tband, hip abd band at ankles green 2 sets 10 Feet on ball bridge, KTC and obl 15 x each Iso abdominals with red ball   11/25/22 Nustep L 6 7 min Lumbar ROM WFLS MMT LE 4+/5 except hip flex 4/5 but overall noted func fatigue with ex Black Tband trunk flex and ext 20 x Seated Rows and Lats 25# 2 sets 10 Standing on airex marching 20 x 2 sets CGA 6 inch step up RT down RT  10 x then 10 x Left- UE assist ( noted left leg weaker than RT) CGA HHA side stepping over foam rolls on airex- min A Isometric upper abs and lower abdominals 10 x each 5# step tap 10 x BIL fwd and laterally, then alt 20 x ( left leg notably weaker) unable to do lateral with wt on HS curl 25# 2 sets 10 Knee ext 10# 2 set s10     11/15/22:  Nustep L 6 66min 3 way plyo ball rolls outs 10 each with large blue ball  Feet on ball bridge, KTC and obl 15 x each Iso abdominal 15 x Resisted trunk rotation 15 x Clams and hip flexion tband 2 sets 10 in supine hook lying Seated pelvic tilt with marching Black tband trunk flex and ext 2 sets 10 Seated row and lats 2 sets 10 x with green theraband PROM LE and trunk in supine, therapist providing gentle overpressure and prolonged stretch  11/11/22 Nustep L 6 49min 3 way plyo ball rolls outs 10 each Feet on ball bridge, KTC and obl 15 x each Iso abdominal 15 x Resisted trunk rotation 15 x Clams and hip flexion tband 2 sets 10 Black tband trunk flex and ext 2 sets 10 Seated row and lats 2 sets 10 PROM LE and trunk   11/04/22 Nustep L 5  43min B Ue's and LE's Modified sit ups 10 x STS with wt ball chest press 10 x Standing wt ball trunk rotation 12 x each Seated wt ball diagonals 10 each way 3 way plyoball roll outs 10 each way Supine  clams with green tband and ppt 2 sets 10 Supine hip flexion green tband and ppt 2 sets 10 Feet on ball bridge, obl and KTC Iso abdominals upper and lower with ball PROM LE and trunk     Nustep L 5  87min B Ue's and LE's Black bar plantarflexor stretch 30 sec each side, 3 sets  Black bar DF and PF 20 x Feet on ball bridge 2 sets 10, obl 20, KTC 10x Supine ppt 10 x hold 3 sec Supine isometric knee to chest 5 sec holds, 10 x each side Supine clams with green tband and ppt 2 sets 10 Supine hip flexion green tband and ppt 2 sets 10 Sidelying B for hip abduction with leg straight, therapist lightly supported leg to assist with eccentric lowering.  2 sets each 12 reps  PROM and stretches to back and LE with neural tension glides Seated tailor's stretch 30 sec hold 3 reps each leg   11-03-22 Nustep L 4 24min  Mod dead lift without wt 10x Black bar DF and PF 20 x Feet on ball bridge 2 sets 10, obl 20, KTC 10x Isometric abdominals 15 x hold 3sec Supine ppt 15 x hold 3 sec Supine clams with green tband and ppt 2 sets 10 Supine hip flexion green tband and ppt 2 sets 10 PROM and stretches to back and LE with neural tension glides  10/25/22 NuStep L3 x 6 minutes Supine over Blue physioball- bridge x 10, repeat with hip IR, B KTC , but reported pain moving into the backs of her legs, bridge with side to side roll- 10 each Supine SLR x 10 each leg, clamshells,  Supine stretch B KTC, LTR, ITB stretch Side lying hip abd, 10 reps each side Seated HS stretch, 3 x 15 seconds each leg  10/21/22 Nustep L 5 33min LE only Resisted gait 30 # 4 way 5  x each TM ( with UE support) 4 min- working on increased stride and lifter steps .7 mph HS curl 25# 2 sets 10 Knee ext 10# 3 sets 5 ( last week unable to do 10#) Standing red tband hip flex,ext and abd 10 x each ( pulling sensation in post leg with extension stopped when mvmt stopped, abd weak) Step ups 6 inch 10 x fwd, 10 x laterally with UE support- left  leg weaker with step ups STS with wt ball press 2 sets 10  10/18/22  Nustep L5 x 5 min Hamstring stretch x30 sec R&L Figure 4 stretch 2x30 sec R&L Bridging x10 Forward tap 4" step 2x10 Side tap 4" step 2x10 R&L See assessments above  10/14/22 Nustep L 5 34min Amb 3 laps without AD 1 min 46 sec Obstacle course stepping on and over object- CG-min A with HHA needed for 6 inch curb and large roll Obstacle course step over step Various 6 inch step ups ex- 2 HHA then 1 HHA Resisted gait 30 # 4 way 4 x each Seated row and lat pull down 20# 2 sets 12 Black tband trunk ext 2 sets 10  10/07/22 Nustep L 5 6 min MMT assessed STS wt ball chest press 10 x Standing wt ball rotation 10 each and 10 x OH press HS curl 20# 2 sets 10 Knee ext 5# 2 sets 10 ( 10# was too heavy) Seated row and lat pull down 20# 2 sets 12 Black tband trunk ext 2 sets 10 Amb 3 laps without AD about 350 feet  1 min 42 sec Forward and lateral 4 in step ups x 10 each bilaterally HHA x 2    10/04/22 NuStep L4 x 6 min S2S holding yellow ball 2x10  Forward and lateral 4 in step ups x 10 each bilaterally HHA x 2  HS curls green 2x12 LAQ 2lb 2x12 each  Standing march 2lb 2x10 each Seated row and lats 2 sets 12 20#   09/16/22 Bike L 4 2 min then decreased L 2 4 min STS on airex, slight elevated mat 10x with CGA- some instability HHA on airex 3# ankle wts 10 x SL then 10 x alt Marching,hip flex SL ,hip abd and hip ext HHA marching fwd and back 10 feet 2 x, laterally 10 feet 2 x Standing HS curl 3# 2 sets 10 Wt ball trunk ext,rotation,chest press and OH 10 x each Amb 360 feet without AD SBA 2 min 30 sec Seated row and lats 2 sets 12 20# Hamstring Curls 20lb 2x12  Leg Ext 5lb 2x12.    09/13/22 NuStep L5 x5 min Foto Gait W/ SPC 360 feet reports some stinging int he back of her legs Hamstring Curls 20lb 2x12  Leg Ext 5lb 2x12. S2S holding red ball 2x10   09/06/22 NuStep L 5 x 6 min  Hamstring Curls 20lb  3x10  Leg Ext 5lb 3x10 Shoulder Ext 5lb 2x10  Standing rows 10lb 2x10 Sit to stand 2x10  Supine bridges 2x10 PROM/stretching LE and trunk   PATIENT EDUCATION:  Education details: POC Person educated: Patient Education method: Explanation Education comprehension: verbalized understanding  HOME EXERCISE PROGRAM:  [id number]  ASSESSMENT:  CLINICAL IMPRESSION: The patient is attending skilled physical therapy to address B lower leg pain and dysfunction related to spinal stenosis. Session focused postural core strength.  Pt benefits from continued PT to address above deficits. Goals assessed today  OBJECTIVE IMPAIRMENTS: Abnormal gait, decreased activity tolerance, decreased balance, decreased coordination,  decreased endurance, decreased mobility, difficulty walking, decreased ROM, decreased strength, increased muscle spasms, impaired flexibility, improper body mechanics, postural dysfunction, and pain.   ACTIVITY LIMITATIONS: carrying, lifting, bending, standing, squatting, sleeping, stairs, transfers, and locomotion level  PARTICIPATION LIMITATIONS: meal prep, cleaning, laundry, interpersonal relationship, driving, shopping, and occupation  PERSONAL FACTORS: Age, Past/current experiences, and 1 comorbidity: Meningioma  are also affecting patient's functional outcome.   REHAB POTENTIAL: Good  CLINICAL DECISION MAKING: Evolving/moderate complexity  EVALUATION COMPLEXITY: Moderate   GOALS: Goals reviewed with patient? Yes  SHORT TERM GOALS: Target date: 08/23/2022  I with basic HEP Baseline: Goal status: met 08/26/22  PRIOR LONG TERM GOALS: Target date: 10/18/2022  I with final HEP Baseline:  Goal status:  On going 10/07/22  10/14/22 [email or web address]  10/28/22 ongoing 11/04/22 on going  11/12/22 evolving 11/25/22  2.  Increase FOTO score to at least 52 Baseline: 40 Goal status: Met  09/13/22  3.  Decrease TUG14.5 time to < 12 Baseline:  Goal status: 08/26/22 with SPC 9.58 sec  MET  4.  Decrease 5 x STS to < 12 sec Baseline: 19 Goal status: 08/26/22 12.47sec progressing. 09/02/22 MET  5.  Patient will walk x at least 300' with LRAD, MI, no pain in legs Baseline:  Goal status: On going 09/13/22. 10/07/22 on going 10/14/22 distance met but pain 10/28/22 pain is an issue  Distance met but pain 11/04/22 11/12/22 on going 11/25/22 progressing  6.  Assess and update sitting posture in her workspace to provide adequate trunk support. Baseline:  Goal status: Progressing 09/13/22, progressing 10/07/22 MET 10/14/22  REVISED LONG TERM GOALS: Target date: 12/13/2022  I with final HEP for return to community wellness/gym (per pt goals) Baseline:  Goal status:  On going 10/07/22  10/14/22 evolving 11/04/22 and 11/12/22. Evolving 11/25/22 and 12/01/08. Continue to adjust and modify as needed 12/09/22  2.  Patient will walk x at least 500' with LRAD, MI, no pain in legs Baseline: Had been able to do 300' with LRAD but had pain in posterior legs Goal status: Revised goal 11/04/22 On going 11/25/22 progressing 12/02/22.  12/09/22 Progressing pain limits distance esp with community reentry 12/16/22 progressing  12/23/22 progressing  3.  Pt will demo at least 4+/5 hip abd in sidelying for improved standing stability Baseline:  Goal status: Revised goal  ongoing 10/28/22 Progressing 11/04/22 and 11/12/22. 11/25/22 MEt except hip flex 4/5 and fatigues quickly overall 12/02/22 fatigue quickly and abd weakness in hip snoted 12/09/22 func hip abd weakness  12/16/22 progressing  12/23/22 progressing  4.  Pt will have improved FGA to >/=22/30 to demo decreased fall risk with amb Baseline:  Goal status: New  12/09/22 progressing  12/16/22 progressing  12/23/22  PLAN:  PT FREQUENCY: 1-2x/week  PT DURATION: 8 weeks  PLANNED INTERVENTIONS: Therapeutic exercises, Therapeutic activity, Neuromuscular re-education, Balance training, Gait training, Patient/Family education, Self Care, Joint mobilization, Stair training, Dry  Needling, Electrical stimulation, Cryotherapy, Moist heat, Ionotophoresis 4mg /ml Dexamethasone, and Manual therapy.  PLAN FOR NEXT SESSION: Will need Renewal Next Session after MD follow up     [place], PTA 12/23/2022, 4:59 PM [facility]. [place], [place], [address] Phone: [phone]   Fax:  [phone]  Patient Details  Name: JAYNIAH WEIMAN MRN: [id number] Date of Birth: [birth year] Referring Provider:  Seward Carol, MD  Encounter Date: 12/23/2022

## 2022-12-27 ENCOUNTER — Ambulatory Visit: Payer: Medicare Other | Admitting: Physical Therapy

## 2023-01-03 ENCOUNTER — Encounter: Payer: Self-pay | Admitting: Physical Therapy

## 2023-01-03 ENCOUNTER — Ambulatory Visit: Payer: Medicare Other | Attending: Internal Medicine | Admitting: Physical Therapy

## 2023-01-03 DIAGNOSIS — M6281 Muscle weakness (generalized): Secondary | ICD-10-CM | POA: Diagnosis present

## 2023-01-03 DIAGNOSIS — G8929 Other chronic pain: Secondary | ICD-10-CM | POA: Diagnosis present

## 2023-01-03 DIAGNOSIS — M5459 Other low back pain: Secondary | ICD-10-CM | POA: Diagnosis present

## 2023-01-03 DIAGNOSIS — M5442 Lumbago with sciatica, left side: Secondary | ICD-10-CM | POA: Insufficient documentation

## 2023-01-03 DIAGNOSIS — M5441 Lumbago with sciatica, right side: Secondary | ICD-10-CM | POA: Diagnosis present

## 2023-01-03 DIAGNOSIS — R262 Difficulty in walking, not elsewhere classified: Secondary | ICD-10-CM | POA: Insufficient documentation

## 2023-01-03 DIAGNOSIS — R2681 Unsteadiness on feet: Secondary | ICD-10-CM | POA: Insufficient documentation

## 2023-01-03 NOTE — Therapy (Signed)
[facility]ER[facility] THORACOLUMBAR TREATMENT  Progress Note Reporting Period 11/11/22 to 01/03/23 for visits 21-30  See note below for Objective Data and Assessment of Progress/Goals.         Patient Name: Olivia Shah MRN: [id number] DOB:04/14/1956, 67 y.o., female Today's Date: 01/03/2023   PT End of Session - 01/03/23 1144     Visit Number 30    PT Start Time 1145    PT Stop Time 1230    PT Time Calculation (min) 45 min    Activity Tolerance Patient tolerated treatment well    Behavior During Therapy WFL for tasks assessed/performed                Past Medical History:  Diagnosis Date   Arthritis    Diabetes mellitus without complication    Hypertension    Past Surgical History:  Procedure Laterality Date   APPLICATION OF CRANIAL NAVIGATION N/A 05/18/2021   Procedure: APPLICATION OF CRANIAL NAVIGATION;  Surgeon: Jadene Pierini, MD;  Location: [facility] OR;  Service: Neurosurgery;  Laterality: N/A;   BREAST BIOPSY Right    CHOLECYSTECTOMY     CRANIOTOMY Right 05/18/2021   Procedure: Right craniotomy for tumor resection;  Surgeon: Jadene Pierini, MD;  Location: [facility] OR;  Service: Neurosurgery;  Laterality: Right;   EYE SURGERY     as a child   HERNIA REPAIR     Patient Active Problem List   Diagnosis Date Noted   Abnormal findings on diagnostic imaging of other specified body structures 01/14/2022   Chronic pain 01/14/2022   Colon cancer screening 01/14/2022   Dysphagia 01/14/2022   Family history of malignant neoplasm of digestive organs 01/14/2022   Hyperglycemia due to type 2 diabetes mellitus 01/14/2022   Hypertension 01/14/2022   Meralgia paresthetica 01/14/2022   Mixed hyperlipidemia 01/14/2022   Morbid obesity 01/14/2022   Overweight 01/14/2022   Pure hypercholesterolemia 01/14/2022   Spinal cord disease 01/14/2022   Chronic cough 12/01/2021   Diabetes 07/08/2021   Glaucoma 07/08/2021   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   Visual  field defect of left eye 07/08/2021   Debility 06/16/2021   Hypokalemia    Seizures    Benign essential HTN    Controlled type 2 diabetes mellitus with hyperglycemia, without long-term current use of insulin    Stridor 06/05/2021   Status post tracheostomy    Acute respiratory failure with hypoxia    Status epilepticus    Brain tumor 05/18/2021    PCP: Renford Dills, MD   REFERRING PROVIDER: Renford Dills, MD   REFERRING DIAG: Diagnosis M48.061 (ICD-10-CM) - Spinal stenosis, lumbar region without neurogenic claudication   Rationale for Evaluation and Treatment: Rehabilitation  THERAPY DIAG:  Muscle weakness (generalized)  Unsteadiness on feet  Difficulty in walking, not elsewhere classified  Other low back pain  Chronic bilateral low back pain with bilateral sciatica  ONSET DATE: 07/06/2022   SUBJECTIVE:  SUBJECTIVE STATEMENT:  Pt has a new order Lumbar epidural mass. Pr stated MRI showed something on the back of her legs but don not know what it is.  Will received another MRI with injections on 4/25 .    PERTINENT HISTORY:  Meningioma, removed, R BG infarct, old  PAIN:  Are you having pain? 0/10  PRECAUTIONS: None  WEIGHT BEARING RESTRICTIONS: No  FALLS:  Has patient fallen in last 6 months? No  LIVING ENVIRONMENT: Lives with: lives alone Lives in: House/apartment Stairs: Yes: External: 2 steps; on right going up Has following equipment at home: Single point cane and Walker - 2 wheeled  OCCUPATION: Receptionist, sitting, full time  PLOF: Independent  PATIENT GOALS: Decrease pain, be able to move around without pain   OBJECTIVE:   DIAGNOSTIC FINDINGS:  Lumbar spinal stenosis  PATIENT SURVEYS:  FOTO 40.26  SCREENING FOR RED FLAGS: Bowel or bladder incontinence:  No Spinal tumors: No Cauda equina syndrome: No Compression fracture: No Abdominal aneurysm: No  COGNITION: Overall cognitive status: Within functional limits for tasks assessed    MUSCLE LENGTH: Hamstrings: Right 82 deg; Left 84 deg B hip flexors appear tight  POSTURE: increased lumbar lordosis, decreased thoracic kyphosis, right pelvic obliquity, and R shoulder elevated RLE longer than L 3/4"  PALPATION: N TTP in back or buttocks or legs  LUMBAR ROM:   AROM eval 09/02/22 09/13/22 01/03/23  Flexion WNL WNLs WFL WFL  Extension Severely limited Decreased 90% Decreased 50% Decreased 25%  Right lateral flexion WNL WFLs [facility] WFL  Left lateral flexion 3" above knee WFLS [facility] WFL  Right rotation 30% WFLs [facility] WFL  Left rotation 40% WFLs WFL WFL   (Blank rows = not tested)  LOWER EXTREMITY ROM:    WNL B  LOWER EXTREMITY MMT:    MMT Right eval Left eval RT/Left 09/02/22 RT/Left 10/07/22 Rt/Lt 10/18/22 Rt/Lt 01/03/23  Hip flexion 3+ 3+ 4/4 4/4 4/4 4/4  Hip extension 3- 3- 4/4 4+/4+ 3+/3+    Hip abduction 3 3 4/4 4+/4+ 3+/3+ without compensation in sidelying 4+/4+  Hip adduction      5/5  Hip internal rotation        Hip external rotation        Knee flexion 4- 4-  4+/4+ 5/5 4/4  Knee extension 3+ 3+  4/4 4+/4+ 5/5  Ankle dorsiflexion 4 4  4/4 4+/4+   Ankle plantarflexion        Ankle inversion        Ankle eversion         (Blank rows = not tested)  LUMBAR SPECIAL TESTS:  Straight leg raise test: Negative and Slump test: Negative  FUNCTIONAL TESTS: 5/5 5 times sit to stand: 19.26    09/02/22 12.3 sec 10/18/22 13.9 sec 01/03/23 13.9 sec Timed up and go (TUG): 14.50   09/02/22 no AD 8.1 sec Functional gait assessment: TBD  10/18/22 15/30    GAIT: Distance walked: at least 2 laps around gym  Assistive device utilized: Single point cane and it was set too tall, but she declined to lower it. Level of assistance: Modified independence Comments: Patient declined to adjust cane  height.  TODAY'S TREATMENT:  DATE: 01/03/23 NuStep L 5 x 6 min Assessment  Gait 3 laps w/ SPC 360 ft Education HEP and managing care at home  12/23/22 Nustep L 6 7 min 4 inch with airex on top UE support up with RT 10 x then left 10x- noteable weakness on left vs RT Ladder working on taking longer steps CGA  up and back 3 x with 1 LOB Side stepping in ladder taking wider steps 2 x each way SL HS curl 10# 2 sets 10 each Knee Ext 10# 2 sets 10 ( SL decreased ability weakness and pain/crepitus) Rows & Lats 15lb 2x10 Black tband trunk ext 2 sets 10 In //bars on foam mat- tandem ,side stepping, marching, toe raises    12/20/22 NuStep L5 x7 min Rows & Lats 15lb 2x10 Shoulder Ext 5lb 2x10 Hamstring  curls 15lb 2x15 Leg Ext 5lb 2x15 S2S holding red ball 2x10 Side step on balance bean in // bars 4in step ups 2 rails x5 each   12/16/22 Amb 3min outside no AD CGA, instability with 1 LOB. Lateral sway and trouble up curbs, fatigued quickly in LE Nustep L 6 5min LE only STS on airex 10 x HS curl 25# 2 sets 10 Knee ext 10# 2 sets 10 HHA stepping over airex SW 10 x each, the fwd 10 x each foot Modified sit up 2 sets 10 with wt ball   12/09/22  Nustep L 6 7min Side stepping in and out of 6 inch box 10 x 3# ankle wts- HHA needed , compensations with LLE to clear box HHA 3# fwd stepping in/out box 20 x alt- cuing for LLE as she tends to compensate with rotation 3# alt step tap 20 x 6 inch 1 x with single HHA, 1 x with no UE support but CGA Standing on airex HHA 3# ankle wts alt hip flex,ext and abd. 10x abd each leg 2 sets  ( BIL hip weakness and fatigue esp abd and Left>RT) Wt ball modified sit ups 15 x Wt ball seated press with core engaged 10 x chest press and OH Leg Press 30# feet 3 way 10 x each    12/06/22: Nustep L 6 7min tband hip ext,flex and abd 10 x  BIL ( weakness with abduction)  tband HS curl 2 sets 10 seated green HS curl 25# 2 sets 10 Knee ext 10# 2 sets 10 Standing for heel /toe raises at sink, 10 reps x 2 Standing at sink for unilateral high marches, 10 reps x 2 Feet on green physioball bridge 2 sets 10,  KTC and obl 10 x each Iso abdominals with red physioball 10 reps,  5 sec holds , 2 sets Seated for pelvic tilts with marching 2 x 10 Rows, lat pul downs 15 #, 1 set each of 10 reps.  12/02/22 Nustep L 6 7min Resisted gait fwd,back and laterally 30# 5 x each with 4 inch taps Red tband hip ext,flex and abd 10 x BIL ( weakness with abduction) Red tband HS curl 2 sets 10 standing HS curl 25# 2 sets 10 Knee ext 10# 2 sets 10 Seated clams and hip flex blue tband, hip abd band at ankles green 2 sets 10 Feet on ball bridge, KTC and obl 15 x each Iso abdominals with red ball   11/25/22 Nustep L 6 7 min Lumbar ROM WFLS MMT LE 4+/5 except hip flex 4/5 but overall noted func fatigue with ex Black Tband trunk flex and ext 20 x Seated Rows  and Lats 25# 2 sets 10 Standing on airex marching 20 x 2 sets CGA 6 inch step up RT down RT 10 x then 10 x Left- UE assist ( noted left leg weaker than RT) CGA HHA side stepping over foam rolls on airex- min A Isometric upper abs and lower abdominals 10 x each 5# step tap 10 x BIL fwd and laterally, then alt 20 x ( left leg notably weaker) unable to do lateral with wt on HS curl 25# 2 sets 10 Knee ext 10# 2 set s10     11/15/22:  Nustep L 6 3 way plyo ball rolls outs 10 each with large blue ball  Feet on ball bridge, KTC and obl 15 x each Iso abdominal 15 x Resisted trunk rotation 15 x Clams and hip flexion tband 2 sets 10 in supine hook lying Seated pelvic tilt with marching Black tband trunk flex and ext 2 sets 10 Seated row and lats 2 sets 10 x with green theraband PROM LE and trunk in supine, therapist providing gentle overpressure and prolonged stretch  11/11/22 Nustep L 6  3 way plyo ball rolls outs 10 each Feet on ball bridge, KTC and obl 15 x each Iso abdominal 15 x Resisted trunk rotation 15 x Clams and hip flexion tband 2 sets 10 Black tband trunk flex and ext 2 sets 10 Seated row and lats 2 sets 10 PROM LE and trunk   11/04/22 Nustep L 5  B Ue's and LE's Modified sit ups 10 x STS with wt ball chest press 10 x Standing wt ball trunk rotation 12 x each Seated wt ball diagonals 10 each way 3 way plyoball roll outs 10 each way Supine clams with green tband and ppt 2 sets 10 Supine hip flexion green tband and ppt 2 sets 10 Feet on ball bridge, obl and KTC Iso abdominals upper and lower with ball PROM LE and trunk     Nustep L 5  B Ue's and LE's Black bar plantarflexor stretch 30 sec each side, 3 sets  Black bar DF and PF 20 x Feet on ball bridge 2 sets 10, obl 20, KTC 10x Supine ppt 10 x hold 3 sec Supine isometric knee to chest 5 sec holds, 10 x each side Supine clams with green tband and ppt 2 sets 10 Supine hip flexion green tband and ppt 2 sets 10 Sidelying B for hip abduction with leg straight, therapist lightly supported leg to assist with eccentric lowering.  2 sets each 12 reps  PROM and stretches to back and LE with neural tension glides Seated tailor's stretch 30 sec hold 3 reps each leg   Nov 13, 2022 Nustep L 4  Mod dead lift without wt 10x Black bar DF and PF 20 x Feet on ball bridge 2 sets 10, obl 20, KTC 10x Isometric abdominals 15 x hold 3sec Supine ppt 15 x hold 3 sec Supine clams with green tband and ppt 2 sets 10 Supine hip flexion green tband and ppt 2 sets 10 PROM and stretches to back and LE with neural tension glides  10/25/22 NuStep L3 x 6 minutes Supine over Blue physioball- bridge x 10, repeat with hip IR, B KTC , but reported pain moving into the backs of her legs, bridge with side to side roll- 10 each Supine SLR x 10 each leg, clamshells,  Supine stretch B KTC, LTR, ITB stretch Side lying  hip abd, 10 reps each side Seated  HS stretch, 3 x 15 seconds each leg  10/21/22 Nustep L 5 LE only Resisted gait 30 # 4 way 5 x each TM ( with UE support) 4 min- working on increased stride and lifter steps .7 mph HS curl 25# 2 sets 10 Knee ext 10# 3 sets 5 ( last week unable to do 10#) Standing red tband hip flex,ext and abd 10 x each ( pulling sensation in post leg with extension stopped when mvmt stopped, abd weak) Step ups 6 inch 10 x fwd, 10 x laterally with UE support- left leg weaker with step ups STS with wt ball press 2 sets 10  10/18/22  Nustep L5 x 5 min Hamstring stretch x30 sec R&L Figure 4 stretch 2x30 sec R&L Bridging x10 Forward tap 4" step 2x10 Side tap 4" step 2x10 R&L See assessments above  10/14/22 Nustep L 5 Amb 3 laps without AD 1 min 46 sec Obstacle course stepping on and over object- CG-min A with HHA needed for 6 inch curb and large roll Obstacle course step over step Various 6 inch step ups ex- 2 HHA then 1 HHA Resisted gait 30 # 4 way 4 x each Seated row and lat pull down 20# 2 sets 12 Black tband trunk ext 2 sets 10   PATIENT EDUCATION:  Education details: POC Person educated: Patient Education method: Explanation Education comprehension: verbalized understanding  HOME EXERCISE PROGRAM:  [id number]  ASSESSMENT:  CLINICAL IMPRESSION:  Pt returns to therapy after MRI. Pt stated that  MRI showed an Mass on her Legs. Pt's Referral stated lumbar epidural mass. Pt reports that's the MD was not sure what the mass was and she has a new MRI scheduled for 4/25. She repots he only issue as beng posterior leg pain which she has been receiving therapy for with little improvement over the past 30 visits. Pt reports some concern because she received a bill.  She reports not doing HEP so I educated her on the importance of activity at hoe. She reports no functional limitations but does receive help from family members. Will unlink referral to today's  visit. Advised pt to return to therapy after she gets new MRI result.   OBJECTIVE IMPAIRMENTS: Abnormal gait, decreased activity tolerance, decreased balance, decreased coordination, decreased endurance, decreased mobility, difficulty walking, decreased ROM, decreased strength, increased muscle spasms, impaired flexibility, improper body mechanics, postural dysfunction, and pain.   ACTIVITY LIMITATIONS: carrying, lifting, bending, standing, squatting, sleeping, stairs, transfers, and locomotion level  PARTICIPATION LIMITATIONS: meal prep, cleaning, laundry, interpersonal relationship, driving, shopping, and occupation  PERSONAL FACTORS: Age, Past/current experiences, and 1 comorbidity: Meningioma  are also affecting patient's functional outcome.   REHAB POTENTIAL: Good  CLINICAL DECISION MAKING: Evolving/moderate complexity  EVALUATION COMPLEXITY: Moderate   GOALS: Goals reviewed with patient? Yes  SHORT TERM GOALS: Target date: 08/23/2022  I with basic HEP Baseline: Goal status: met 08/26/22  PRIOR LONG TERM GOALS: Target date: 10/18/2022  I with final HEP Baseline:  Goal status:  On going 10/07/22  10/14/22 evolvimg  10/28/22 ongoing 11/04/22 on going  11/12/22 evolving 11/25/22  2.  Increase FOTO score to at least 52 Baseline: 40 Goal status: Met  09/13/22  3.  Decrease TUG14.5 time to < 12 Baseline:  Goal status: 08/26/22 with SPC 9.58 sec MET  4.  Decrease 5 x STS to < 12 sec Baseline: 19 Goal status: 08/26/22 12.47sec progressing. 09/02/22 MET  5.  Patient will walk x at least 300' with LRAD,  MI, no pain in legs Baseline:  Goal status: On going 09/13/22. 10/07/22 on going 10/14/22 distance met but pain 10/28/22 pain is an issue  Distance met but pain 11/04/22 11/12/22 on going 11/25/22 progressing  6.  Assess and update sitting posture in her workspace to provide adequate trunk support. Baseline:  Goal status: Progressing 09/13/22, progressing 10/07/22 MET 10/14/22  REVISED  LONG TERM GOALS: Target date: 12/13/2022  I with final HEP for return to community wellness/gym (per pt goals) Baseline:  Goal status:  On going 10/07/22  10/14/22 evolving 11/04/22 and 11/12/22. Evolving 11/25/22 and 12/01/08. Continue to adjust and modify as needed 12/09/22  2.  Patient will walk x at least 500' with LRAD, MI, no pain in legs Baseline: Had been able to do 300' with LRAD but had pain in posterior legs Goal status: Revised goal 11/04/22 On going 11/25/22 progressing 12/02/22.  12/09/22 Progressing pain limits distance esp with community reentry 12/16/22 progressing  12/23/22 progressing  3.  Pt will demo at least 4+/5 hip abd in sidelying for improved standing stability Baseline:  Goal status: Revised goal  ongoing 10/28/22 Progressing 11/04/22 and 11/12/22. 11/25/22 MEt except hip flex 4/5 and fatigues quickly overall 12/02/22 fatigue quickly and abd weakness in hip snoted 12/09/22 func hip abd weakness  12/16/22 progressing  12/23/22 progressing  4.  Pt will have improved FGA to >/=22/30 to demo decreased fall risk with amb Baseline:  Goal status: New  12/09/22 progressing  12/16/22 progressing  12/23/22  PLAN:  PT FREQUENCY: 1-2x/week  PT DURATION: 8 weeks  PLANNED INTERVENTIONS: Therapeutic exercises, Therapeutic activity, Neuromuscular re-education, Balance training, Gait training, Patient/Family education, Self Care, Joint mobilization, Stair training, Dry Needling, Electrical stimulation, Cryotherapy, Moist heat, Ionotophoresis 4mg /ml Dexamethasone, and Manual therapy.  PLAN FOR NEXT SESSION: D/C PT  PHYSICAL THERAPY DISCHARGE SUMMARY  Visits from Start of Care: 30   Patient agrees to discharge. Patient goals were partially met. Patient is being discharged due to lack of progress.    Grayce Sessions, PTA 01/03/2023, 12:00 PM [place]

## 2023-01-10 ENCOUNTER — Ambulatory Visit: Payer: Medicare Other | Admitting: Physical Therapy

## 2023-01-13 ENCOUNTER — Ambulatory Visit: Payer: Medicare Other | Admitting: Physical Therapy

## 2023-01-17 ENCOUNTER — Encounter: Payer: Self-pay | Admitting: Neurology

## 2023-01-17 ENCOUNTER — Ambulatory Visit (INDEPENDENT_AMBULATORY_CARE_PROVIDER_SITE_OTHER): Payer: Medicare Other | Admitting: Neurology

## 2023-01-17 VITALS — BP 184/132 | HR 73 | Ht 62.0 in | Wt 199.0 lb

## 2023-01-17 DIAGNOSIS — G40909 Epilepsy, unspecified, not intractable, without status epilepticus: Secondary | ICD-10-CM

## 2023-01-17 DIAGNOSIS — Z86018 Personal history of other benign neoplasm: Secondary | ICD-10-CM | POA: Diagnosis not present

## 2023-01-17 DIAGNOSIS — Z9889 Other specified postprocedural states: Secondary | ICD-10-CM

## 2023-01-17 MED ORDER — LACOSAMIDE 100 MG PO TABS: 100.0000 mg | ORAL_TABLET | Freq: Two times a day (BID) | ORAL | 5 refills | Status: DC

## 2023-01-17 NOTE — Patient Instructions (Signed)
Please take Vimpat 100 mg in the morning and 200 mg at night for one month, then further decrease Vimpat to 100 mg twice daily  Continue your other medications  Return in 1 year or sooner if worse

## 2023-01-17 NOTE — Progress Notes (Signed)
[facility]  PATIENT: Olivia Shah DOB: 04/27/1956  REFERRING CLINICIAN: Renford Dills, MD HISTORY FROM: Patient, sister REASON FOR VISIT: New onset seizure s/p meningioma resection    HISTORICAL  CHIEF COMPLAINT:  Chief Complaint  Patient presents with   Follow-up    Rm 12 alone Wants to know how long she needs to continue taking lacosimide  Pt stated falls asleep taking it  Seizure: cannot remember   INTERVAL HISTORY 01/17/2023:  Patient presents today for follow-up, last visit was a year ago.  Since then she has been doing well, denies any seizure or seizure-like activity.  She is still on the Vimpat 200 mg twice daily but reports somnolence in the evening.  She does not have any somnolence after taking her morning dose.  Overall she is doing well, continue to work, again no seizure no other events.  INTERVAL HISTORY 01/14/2022 Patient presents today for follow up. She is accompanied by her sister. At last visit, plan was to continue with Keppra and Vimpat, she could not tolerate Keppra and was experiencing hallucinations. Therefore Keppra discontinued and and she continued on Vimpat 200 mg BID. Since discontinuing the Keppra, no additional hallucinations, doing well on Vimpat, no additional seizures, no other side effects.   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  She underwent a total gross resection on August 22 and postop course complicated by focal status epilepticus requiring multiple anti seizure medications.  She was finally extubated, her ASM regimen was simplified to Keppra 1000 mg twice daily and lacosamide 200 mg twice daily.  She did have a follow-up MRI which showed incidental right hemispheric strokes and also in  the right caudate.  She did have a long rehab course.  She was finally discharged home October 12.  She was not noted to have any seizures while in rehab.  Per family she continued to have cognitive issues but has improved.  She continues with physical therapy and Occupational Therapy.  She also have improvement in her swallowing status. In terms of seizures she denies any past medical history of seizures, denies any family history of seizures, denies any seizure risk factor, seizure started status post meningioma resection.  She still need assistance from transferring from sitting to standing and from laying down to sitting up.  She also needs assistance with using the bathroom.  Handedness: Left handed   Seizure Type: right focal seizure  Current frequency: None since being in rehab   Any injuries from seizures: None   Seizure risk factors: Meningioma resection   Previous ASMs: Levetiracetam, Lacosamide, Phenytoin, Phenobarb, Propofol   Currenty ASMs: Lacosamide   ASMs side effects: Hallucination on Levetiracetam   Brain Images: Skull based meningioma (04/21/2021)   Previous EEGs: Status epilepticus  (05/21/2021)   OTHER MEDICAL CONDITIONS: Diabetes mellitus type 2, hypertension, congenital right eye blindness.   REVIEW OF SYSTEMS: Full 14 system review of systems performed and negative with exception of: As noted in the HPI  ALLERGIES: Allergies  Allergen Reactions   Cashew Nut (Anacardium Occidentale) Skin Test Hives    Hives to multiple nuts   Lisinopril Cough   Shellfish Allergy Hives    HOME MEDICATIONS: Outpatient Medications Prior to Visit  Medication Sig Dispense Refill   acetaminophen (  TYLENOL) 500 MG tablet Take 500 mg by mouth every 6 (six) hours as needed.     Ascorbic Acid (VITAMIN C) 1000 MG tablet Take 1,000 mg by mouth daily.     aspirin EC 81 MG tablet Take 81 mg by mouth daily.     dorzolamide-timolol (COSOPT) 22.3-6.8 MG/ML ophthalmic solution Place 1  drop into the left eye 2 (two) times daily.     famotidine (PEPCID) 20 MG tablet Take 1 tablet (20 mg total) by mouth 2 (two) times daily. 60 tablet 0   hydrochlorothiazide (HYDRODIURIL) 12.5 MG tablet Take 1 tablet (12.5 mg total) by mouth daily. 30 tablet 0   latanoprost (XALATAN) 0.005 % ophthalmic solution Place 1 drop into the left eye at bedtime.     losartan (COZAAR) 50 MG tablet Take 50 mg by mouth daily.     metFORMIN (GLUCOPHAGE) 500 MG tablet Take 2 tablets (1,000 mg total) by mouth daily with breakfast AND 1 tablet (500 mg total) daily with supper. 90 tablet 0   metoprolol tartrate (LOPRESSOR) 100 MG tablet Take 2 tablets (200 mg total) by mouth at bedtime. 60 tablet 0   potassium chloride (KLOR-CON) 10 MEQ tablet Take 2 tablets (20 mEq total) by mouth in the morning and at supper. 120 tablet 0   vitamin E 180 MG (400 UNITS) capsule Take 1 capsule (400 Units total) by mouth daily. 30 capsule 0   lacosamide (VIMPAT) 200 MG TABS tablet Take 1 tablet by mouth twice daily 60 tablet 5   acetaminophen (TYLENOL) 325 MG tablet Take 1-2 tablets (325-650 mg total) by mouth every 4 (four) hours as needed for mild pain.     ATORVASTATIN CALCIUM PO Take by mouth.     losartan (COZAAR) 25 MG tablet Take 1/2 tablet (12.5 mg total) by mouth daily. 15 tablet 0   magnesium oxide (MAG-OX) 400 MG tablet Take 1/2 tablet (200 mg total) by mouth at bedtime. 15 tablet 0   methylphenidate (RITALIN) 5 MG tablet Take 1 tablet (5 mg total) by mouth 2 (two) times daily with breakfast and lunch. (Patient not taking: Reported on 01/17/2023) 60 tablet 0   Multiple Vitamin (MULTIVITAMIN WITH MINERALS) TABS tablet Take 1 tablet by mouth daily.     polyethylene glycol powder (GLYCOLAX/MIRALAX) 17 GM/SCOOP powder Take 17 g by mouth daily. 238 g 0   saccharomyces boulardii (FLORASTOR) 250 MG capsule Take 1 capsule (250 mg total) by mouth 2 (two) times daily. 60 capsule 0   No facility-administered medications prior to  visit.    PAST MEDICAL HISTORY: Past Medical History:  Diagnosis Date   Arthritis    Diabetes mellitus without complication    Hypertension     PAST SURGICAL HISTORY: Past Surgical History:  Procedure Laterality Date   APPLICATION OF CRANIAL NAVIGATION N/A 05/18/2021   Procedure: APPLICATION OF CRANIAL NAVIGATION;  Surgeon: Jadene Pierini, MD;  Location: [facility] OR;  Service: Neurosurgery;  Laterality: N/A;   BREAST BIOPSY Right    CHOLECYSTECTOMY     CRANIOTOMY Right 05/18/2021   Procedure: Right craniotomy for tumor resection;  Surgeon: Jadene Pierini, MD;  Location: [facility];  Service: Neurosurgery;  Laterality: Right;   EYE SURGERY     as a child   HERNIA REPAIR      FAMILY HISTORY: Family History  Problem Relation Age of Onset   Breast cancer Neg Hx     SOCIAL HISTORY: Social History   Socioeconomic History   Marital status: Single  Spouse name: Not on file   Number of children: Not on file   Years of education: Not on file   Highest education level: Not on file  Occupational History   Not on file  Tobacco Use   Smoking status: Never   Smokeless tobacco: Never  Substance and Sexual Activity   Alcohol use: Not on file   Drug use: Not Currently   Sexual activity: Not Currently  Other Topics Concern   Not on file  Social History Narrative   Not on file   Social Determinants of Health   Financial Resource Strain: Not on file  Food Insecurity: Not on file  Transportation Needs: Not on file  Physical Activity: Not on file  Stress: Not on file  Social Connections: Not on file  Intimate Partner Violence: Not on file     PHYSICAL EXAM GENERAL EXAM/CONSTITUTIONAL: Vitals:  Vitals:   01/17/23 1120  BP: (!) 184/132  Pulse: 73  Weight: 199 lb (90.3 kg)  Height:  (1.575 m)   Body mass index is 36.4 kg/m. Wt Readings from Last 3 Encounters:  01/17/23 199 lb (90.3 kg)  01/14/22 175 lb (79.4 kg)  07/07/21 176 lb 5.9 oz (80 kg)   Patient is  in no distress; well developed, nourished and groomed; neck is supple  EYES: Pupils round and reactive to light, Loss of vision with the right eye and decrease peripheral vision with the left. No results found.  MUSCULOSKELETAL: Gait, strength, tone, movements noted in Neurologic exam below  NEUROLOGIC: MENTAL STATUS:      No data to display         awake, alert, oriented to person, place   language fluent, normal comprehension and attention  CRANIAL NERVE:   2nd, 3rd, 4th, 6th - left pupil is reactive to light, she has loss of peripheral vision with the left eye and no vision perception with the right.    MOTOR:  At least antigravity.    GAIT/STATION:  Walks with a walker     DIAGNOSTIC DATA (LABS, IMAGING, TESTING) - I reviewed patient records, labs, notes, testing and imaging myself where available.  Lab Results  Component Value Date   WBC 4.5 07/06/2021   HGB 12.1 07/06/2021   HCT 36.7 07/06/2021   MCV 90.0 07/06/2021   PLT 320 07/06/2021      Component Value Date/Time   NA 139 07/06/2021 0646   K 4.3 07/06/2021 0646   CL 110 07/06/2021 0646   CO2 21 (L) 07/06/2021 0646   GLUCOSE 107 (H) 07/06/2021 0646   BUN 9 07/06/2021 0646   CREATININE 0.88 07/06/2021 0646   CALCIUM 9.1 07/06/2021 0646   PROT 5.8 (L) 06/17/2021 0504   ALBUMIN 2.7 (L) 06/17/2021 0504   AST 15 06/17/2021 0504   ALT 36 06/17/2021 0504   ALKPHOS 94 06/17/2021 0504   BILITOT 0.3 06/17/2021 0504   GFRNONAA >60 07/06/2021 0646   Lab Results  Component Value Date   CHOL 201 (H) 07/16/2021   HDL 59 07/16/2021   LDLCALC 124 (H) 07/16/2021   TRIG 101 07/16/2021   Lab Results  Component Value Date   HGBA1C 6.6 (H) 06/05/2021   No results found for: "VITAMINB12" No results found for: "TSH"  MRI 04/22/2021 3.2 x 2.6 x 2.4 cm dural-based mass compatible with meningioma growing along the planum sphenoidale, tuberculum sellae and extending to the paraclinoid regions bilaterally.  Involvement of regional structures, as detailed (including, but not limited to, significant  mass effect upon the optic apparatus). The mass has not significantly changed in size as compared to the prior outside MRI of 03/18/2021. Mild chronic small vessel ischemic changes within the cerebral white matter and pons. Mild generalized parenchymal atrophy.   MRI 05/19/2021 1. Interval right pterional craniotomy and resection of an anterior skull base meningioma. No significant residual masslike enhancement. This study will serve as a new baseline future follow-up imaging. 2. Acute infarcts in the right anterior temporal lobe, inferior frontal lobe, lentiform nucleus and posterior limb of the right internal capsule with mild associated edema. 3. Moderate right greater than left antidependent pneumocephalus (up to 1.8 cm thick) and small volume right-sided extra-axial hemorrhage and/or gelfoam subjacent to the craniotomy (up to 0.9 cm thick) with resulting 3 mm of leftward midline shift and effacement of the right lateral ventricle.   MRI 06/23/2021 1. No new infarct or other acute intracranial abnormality. 2. Evolving subacute infarcts and postoperative changes.   EEG  This study was initially suggestive of cortical dysfunction in right frontotemporal region likely secondary to underlying structural abnormality.  At around 1443 on 05/20/2021, patient started having seizures during which EKG was noted to have left facial twitching and no clinical signs were noted.  Seizures arising from left frontotemporal region, average duration about 2 to 3 minutes.  Patient had around 9 seizures between 40 43-16 49 on 05/20/2021.  Seizure medications were adjusted after which seizures improved.  However seizures recorded on 2332 on 05/20/2021 and patient had average 12 seizures per hour, lasting only 1 minute.  This EEG pattern is consistent with focal status epilepticus.  There is also evidence of moderate diffuse  encephalopathy, nonspecific etiology but likely due to seizures.   ASSESSMENT AND PLAN  67 y.o. year old female  with past medical history of diabetes mellitus type 2, hypertension, congenital blindness of the right eye, no previous history of seizures who was found incidentally to have a skull base meningioma, postresection course complicated by status epilepticus requiring up to 4 antiseizure medications.  Her antiseizure medication has been simplified in rehab to Keppra 1000 mg twice daily and lacosamide 200 mg twice daily and now to Lacosamide 200 mg BID only.  Denies any seizure or seizure-like activity for the past year, does complain of some nighttime somnolence. I will decrease Vimpat to 100 mg twice daily.  First I have advised patient to continue Vimpat 100 mg in the morning and 200 mg at night for 1 month then further decrease to 100 mg twice daily.  I will see her in 1 year for follow-up.  If no additional seizures, then we will continue to decrease the Vimpat and plan to discontinue.  She is comfortable with plans.  Contact me if you do have a breakthrough seizure   1. Status post resection of meningioma   2. Seizure disorder     Patient Instructions  Please take Vimpat 100 mg in the morning and 200 mg at night for one month, then further decrease Vimpat to 100 mg twice daily  Continue your other medications  Return in 1 year or sooner if worse     Per [facility] statutes, patients with seizures are not allowed to drive until they have been seizure-free for six months.  Other recommendations include using caution when using heavy equipment or power tools. Avoid working on ladders or at heights. Take showers instead of baths.  Do not swim alone.  Ensure the water temperature is not too high  on the home water heater. Do not go swimming alone. Do not lock yourself in a room alone (i.e. bathroom). When caring for infants or small children, sit down when holding, feeding, or  changing them to minimize risk of injury to the child in the event you have a seizure. Maintain good sleep hygiene. Avoid alcohol.  Also recommend adequate sleep, hydration, good diet and minimize stress.   During the Seizure  - First, ensure adequate ventilation and place patients on the floor on their left side  Loosen clothing around the neck and ensure the airway is patent. If the patient is clenching the teeth, do not force the mouth open with any object as this can cause severe damage - Remove all items from the surrounding that can be hazardous. The patient may be oblivious to what's happening and may not even know what he or she is doing. If the patient is confused and wandering, either gently guide him/her away and block access to outside areas - Reassure the individual and be comforting - Call 911. In most cases, the seizure ends before EMS arrives. However, there are cases when seizures may last over 3 to 5 minutes. Or the individual may have developed breathing difficulties or severe injuries. If a pregnant patient or a person with diabetes develops a seizure, it is prudent to call an ambulance. - Finally, if the patient does not regain full consciousness, then call EMS. Most patients will remain confused for about 45 to 90 minutes after a seizure, so you must use judgment in calling for help. - Avoid restraints but make sure the patient is in a bed with padded side rails - Place the individual in a lateral position with the neck slightly flexed; this will help the saliva drain from the mouth and prevent the tongue from falling backward - Remove all nearby furniture and other hazards from the area - Provide verbal assurance as the individual is regaining consciousness - Provide the patient with privacy if possible - Call for help and start treatment as ordered by the caregiver   After the Seizure (Postictal Stage)  After a seizure, most patients experience confusion, fatigue, muscle  pain and/or a headache. Thus, one should permit the individual to sleep. For the next few days, reassurance is essential. Being calm and helping reorient the person is also of importance.  Most seizures are painless and end spontaneously. Seizures are not harmful to others but can lead to complications such as stress on the lungs, brain and the heart. Individuals with prior lung problems may develop labored breathing and respiratory distress.     No orders of the defined types were placed in this encounter.    Meds ordered this encounter  Medications   Lacosamide 100 MG TABS    Sig: Take 1 tablet (100 mg total) by mouth 2 (two) times daily.    Dispense:  60 tablet    Refill:  5     Return in about 1 year (around 01/17/2024).    Windell Norfolk, MD 01/17/2023, 11:49 AM  [facility] [address] [place], [place] [address] [phone]

## 2023-01-19 ENCOUNTER — Telehealth: Payer: Self-pay | Admitting: Neurology

## 2023-01-19 NOTE — Telephone Encounter (Signed)
Pt requesting Dr.Camara give her a call. Pt wouldn't tell me the reason she needed to talk to Dr. Teresa Coombs.

## 2023-01-19 NOTE — Telephone Encounter (Signed)
Please let's find out more. I am in and out with patients this morning. Thanks

## 2023-01-19 NOTE — Telephone Encounter (Signed)
Call to patient to obtain more information. Patient forgot to mention on Monday 01/17/23 visit that she has mouth sores that have been reappearing since 07/2022 that her dentist pointed out to her. They are not painful and aren't affecting eating or drinking. She also has itching in her lower stomach area that she uses Vagisil powder for and it is effective. She also has pain in her lower back and thighs and first MRI "showed something" and she is having another MRI tomorrow 01/20/23. She wants to know if any of these could be side effects from the lacosamide. She began taking lacosamide back in 2022 but these symptoms just started more recently. Patient aware I will make Dr. Teresa Coombs aware for advice and/or recommendations

## 2023-01-20 ENCOUNTER — Ambulatory Visit: Payer: Medicare Other | Admitting: Physical Therapy

## 2023-01-24 ENCOUNTER — Ambulatory Visit: Payer: Medicare Other | Admitting: Physical Therapy

## 2023-01-24 NOTE — Telephone Encounter (Signed)
Less but we will continue to observe the symptoms and make sure they are not getting worse.

## 2023-01-24 NOTE — Telephone Encounter (Signed)
Call to patient to inform of Dr. Teresa Coombs recommendations of monitoring mouth sores and follow up with PCP if symptoms get worse. Patient verbalized understanding and appreciative of call.

## 2023-01-27 ENCOUNTER — Ambulatory Visit: Payer: Medicare Other | Admitting: Physical Therapy

## 2023-02-15 ENCOUNTER — Other Ambulatory Visit: Payer: Self-pay | Admitting: Internal Medicine

## 2023-02-15 DIAGNOSIS — Z1231 Encounter for screening mammogram for malignant neoplasm of breast: Secondary | ICD-10-CM

## 2023-02-18 ENCOUNTER — Other Ambulatory Visit: Payer: Self-pay | Admitting: Internal Medicine

## 2023-02-18 DIAGNOSIS — M8580 Other specified disorders of bone density and structure, unspecified site: Secondary | ICD-10-CM

## 2023-03-28 ENCOUNTER — Ambulatory Visit
Admission: RE | Admit: 2023-03-28 | Discharge: 2023-03-28 | Disposition: A | Discharge status: Home / Self Care | Payer: Medicare Other | Source: Ambulatory Visit | Attending: Internal Medicine | Admitting: Internal Medicine

## 2023-03-28 DIAGNOSIS — Z1231 Encounter for screening mammogram for malignant neoplasm of breast: Secondary | ICD-10-CM

## 2023-05-16 ENCOUNTER — Other Ambulatory Visit: Payer: Self-pay

## 2023-05-16 ENCOUNTER — Ambulatory Visit: Payer: Medicare Other | Attending: Neurosurgery

## 2023-05-16 DIAGNOSIS — M5459 Other low back pain: Secondary | ICD-10-CM | POA: Insufficient documentation

## 2023-05-16 DIAGNOSIS — M6281 Muscle weakness (generalized): Secondary | ICD-10-CM | POA: Insufficient documentation

## 2023-05-16 DIAGNOSIS — G8929 Other chronic pain: Secondary | ICD-10-CM | POA: Insufficient documentation

## 2023-05-16 DIAGNOSIS — R2681 Unsteadiness on feet: Secondary | ICD-10-CM | POA: Diagnosis present

## 2023-05-16 DIAGNOSIS — R262 Difficulty in walking, not elsewhere classified: Secondary | ICD-10-CM | POA: Diagnosis present

## 2023-05-16 DIAGNOSIS — M5441 Lumbago with sciatica, right side: Secondary | ICD-10-CM | POA: Insufficient documentation

## 2023-05-16 DIAGNOSIS — M5442 Lumbago with sciatica, left side: Secondary | ICD-10-CM | POA: Diagnosis present

## 2023-05-16 NOTE — Therapy (Unsigned)
OUTPATIENT PHYSICAL THERAPY THORACOLUMBAR EVALUATION   Patient Name: Olivia Shah MRN: [id number] DOB:10/16/1955, 67 y.o., female Today's Date: 05/17/2023  END OF SESSION:  PT End of Session - 05/16/23 1100     Visit Number 1    Date for PT Re-Evaluation 07/11/23    Progress Note Due on Visit 10    PT Start Time 1100    PT Stop Time 1139    PT Time Calculation (min) 39 min    Activity Tolerance Patient tolerated treatment well;Patient limited by pain    Behavior During Therapy [facility] for tasks assessed/performed             Past Medical History:  Diagnosis Date   Arthritis    Diabetes mellitus without complication (HCC)    Hypertension    Past Surgical History:  Procedure Laterality Date   APPLICATION OF CRANIAL NAVIGATION N/A 05/18/2021   Procedure: APPLICATION OF CRANIAL NAVIGATION;  Surgeon: Jadene Pierini, MD;  Location: [facility] OR;  Service: Neurosurgery;  Laterality: N/A;   BREAST BIOPSY Right    CHOLECYSTECTOMY     CRANIOTOMY Right 05/18/2021   Procedure: Right craniotomy for tumor resection;  Surgeon: Jadene Pierini, MD;  Location: [facility] OR;  Service: Neurosurgery;  Laterality: Right;   EYE SURGERY     as a child   HERNIA REPAIR     Patient Active Problem List   Diagnosis Date Noted   Abnormal findings on diagnostic imaging of other specified body structures 01/14/2022   Chronic pain 01/14/2022   Colon cancer screening 01/14/2022   Dysphagia 01/14/2022   Family history of malignant neoplasm of digestive organs 01/14/2022   Hyperglycemia due to type 2 diabetes mellitus (HCC) 01/14/2022   Hypertension 01/14/2022   Meralgia paresthetica 01/14/2022   Mixed hyperlipidemia 01/14/2022   Morbid obesity (HCC) 01/14/2022   Overweight 01/14/2022   Pure hypercholesterolemia 01/14/2022   Spinal cord disease (HCC) 01/14/2022   Chronic cough 12/01/2021   Diabetes (HCC) 07/08/2021   Glaucoma 07/08/2021   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   Visual field defect of left  eye 07/08/2021   Debility 06/16/2021   Hypokalemia    Seizures (HCC)    Benign essential HTN    Controlled type 2 diabetes mellitus with hyperglycemia, without long-term current use of insulin (HCC)    Stridor 06/05/2021   Status post tracheostomy (HCC)    Acute respiratory failure with hypoxia (HCC)    Status epilepticus (HCC)    Brain tumor (HCC) 05/18/2021    PCP: Renford Dills, MD  REFERRING PROVIDER: Donalee Citrin, MD  REFERRING DIAG: LS spinal stenosis with neurogenic claudication  Rationale for Evaluation and Treatment: Rehabilitation  THERAPY DIAG:  Muscle weakness (generalized)  Unsteadiness on feet  Difficulty in walking, not elsewhere classified  Other low back pain  Chronic bilateral low back pain with bilateral sciatica  ONSET DATE: chronic  SUBJECTIVE:  SUBJECTIVE STATEMENT: Patient reports the back of both thighs and occasionally in front of thighs, and lower back with deep aching pain   PERTINENT HISTORY:  The patient has history of chronic lower back pain, participated in PT over 4 months ago for similar problem. Reports increased Sx and progression now of pain to ant thighs , was B post thighs and LB.  Referred to PT by neurologist  PAIN:  Are you having pain? Yes: Pain location: central lumbar spine, radiating into thighs, post and ant , to knees  Pain description: shooting deep pain Aggravating factors: moving back in sitting, immediately with standing, worse with prolonged standing and walking Relieving factors: sitting, lying down  PRECAUTIONS: Fall  RED FLAGS: None   WEIGHT BEARING RESTRICTIONS: No  FALLS:  Has patient fallen in last 6 months? No  LIVING ENVIRONMENT: Lives with: lives alone Lives in: House/apartment Stairs:  ramp Has following equipment  at home: Single point cane, Environmental consultant - 2 wheeled, Environmental consultant - 4 wheeled, and Ramped entry  OCCUPATION: works in a church 4 days a week  PLOF: Independent with household mobility with device, Needs assistance with ADLs, and Needs assistance with homemaking  PATIENT GOALS: reduce pain for better standing tolerance  NEXT MD VISIT: unclear  OBJECTIVE:   DIAGNOSTIC FINDINGS:  na  PATIENT SURVEYS:  Modified Oswestry 26/50   SCREENING FOR RED FLAGS: Bowel or bladder incontinence: No Spinal tumors: No Cauda equina syndrome: No Compression fracture: No Abdominal aneurysm: No  COGNITION: Overall cognitive status: Within functional limits for tasks assessed     SENSATION: Light touch intact B LE's today  MUSCLE LENGTH: Hamstrings: Right wfl deg; Left wfl deg Maisie Fus test: Right wfl deg; Left wfl deg  POSTURE:  narrowed stance B, trendelenburg lurch B, with increased sway with gait, increased lumbar lordosis  PALPATION: Non tender with palpation lumbar paraspinals and B post hip musculature  LUMBAR ROM:   AROM eval  Flexion 80%  Extension 50%  Right lateral flexion 50  Left lateral flexion 50  Right rotation   Left rotation    (Blank rows = not tested)  LOWER EXTREMITY ROM:   all LE AAROM WNL B LOWER EXTREMITY MMT:    MMT Right eval Left eval  Hip flexion 3 3-  Hip extension 3+ 3+  Hip abduction 3 3-  Hip adduction    Hip internal rotation    Hip external rotation    Knee flexion    Knee extension 4 4  Ankle dorsiflexion    Ankle plantarflexion 4- 4-  Ankle inversion    Ankle eversion     (Blank rows = not tested)  LUMBAR SPECIAL TESTS:  Straight leg raise test: Negative and FABER test: Negative  FUNCTIONAL TESTS  30 seconds chair stand test 8 reps Timed up and go (TUG): 20.15 2 minute walk test: 90', 1 min 10 sec, stopped due to B thigh pain  GAIT: Distance walked: 59' Assistive device utilized: Single point cane Level of assistance: Modified  independence Comments: fatigued quickly, increased trunk sway and decreased hip control with fatigue  TODAY'S TREATMENT:  DATE: 05/16/23:    PATIENT EDUCATION:  Education details: POC, goals Person educated: Patient Education method: Explanation, Demonstration, Actor cues, Verbal cues, and Handouts Education comprehension: verbalized understanding, returned demonstration, verbal cues required, and tactile cues required  HOME EXERCISE PROGRAM: Access Code: [email or web address] URL: [email or web address] Date: 05/16/2023 Prepared by: Alekzander Cardell  Exercises - Supine Lower Trunk Rotation  - 1 x daily - 3 x weekly - 3 sets - 10 reps - Hooklying Single Knee to Chest  - 1 x daily - 3 x weekly - 3 sets - 10 reps - Supine Bridge  - 1 x daily - 3 x weekly - 3 sets - 10 reps  ASSESSMENT:  CLINICAL IMPRESSION: Patient is a 67 y.o. female who was evaluated today by physical therapy for a diagnosis of lumbar stenosis with B LE claudication.  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  She has pain with standing and walking, currently able to improve her pain with position changes, sitting and lying down.  She demonstrates strength deficits B hips, particularly abductors and flexors which influences her stability for gait and standing.  Will benefit from comprehensive strengthening program for B LE's. OBJECTIVE IMPAIRMENTS: decreased activity tolerance, decreased balance, decreased endurance, difficulty walking, decreased strength, impaired perceived functional ability, postural dysfunction, and pain.   ACTIVITY LIMITATIONS: carrying, lifting, bending, standing, squatting, stairs, transfers, bathing, and locomotion level  PARTICIPATION LIMITATIONS: cleaning and  laundry  PERSONAL FACTORS: Age, Behavior pattern, Fitness, Past/current experiences, Time since onset of injury/illness/exacerbation, Transportation, and 3+ comorbidities: h/o brain tumor, visual loss , DM  are also affecting patient's functional outcome.   REHAB POTENTIAL: Good  CLINICAL DECISION MAKING: Evolving/moderate complexity  EVALUATION COMPLEXITY: Moderate   GOALS: Goals reviewed with patient? Yes  SHORT TERM GOALS: Target date: 2 weeks   I HEP Baseline:initiated at eval Goal status: INITIAL   LONG TERM GOALS: Target date: 07/11/2023  30 sec sit to stand 11 Baseline: 8 Goal status: INITIAL  2.  TUG improve to 14 or less Baseline: 20.15 Goal status: INITIAL  3.  2 min walk test, 250' or greater Baseline: walked 90 ' in one min and had to stop limited by pain Goal status: INITIAL  4.  Modified oswestry 36/50 Baseline: 26/50 Goal status: INITIAL    PLAN:  PT FREQUENCY: 2x/week  PT DURATION: 8 weeks  PLANNED INTERVENTIONS: Therapeutic exercises, Therapeutic activity, Neuromuscular re-education, Balance training, Gait training, Patient/Family education, Self Care, and Joint mobilization.  PLAN FOR NEXT SESSION: initiate cardiovascular equipment in gym, progress hip, LE strength, stability in supine, standing, sitting.   Bergen Magner L Danitra Payano, PTDPT, OCS 05/17/2023, 2:05 PM

## 2023-06-01 NOTE — Therapy (Signed)
OUTPATIENT PHYSICAL THERAPY THORACOLUMBAR TREATMENT   Patient Name: Olivia Shah MRN: [id number] DOB:1955-12-30, 67 y.o., female Today's Date: 06/02/2023  END OF SESSION:  PT End of Session - 06/02/23 1711     Visit Number 2    Date for PT Re-Evaluation 07/11/23    Progress Note Due on Visit 10    PT Start Time 1712    PT Stop Time 1757    PT Time Calculation (min) 45 min    Activity Tolerance Patient tolerated treatment well;Patient limited by pain    Behavior During Therapy [facility] for tasks assessed/performed              Past Medical History:  Diagnosis Date   Arthritis    Diabetes mellitus without complication (HCC)    Hypertension    Past Surgical History:  Procedure Laterality Date   APPLICATION OF CRANIAL NAVIGATION N/A 05/18/2021   Procedure: APPLICATION OF CRANIAL NAVIGATION;  Surgeon: Jadene Pierini, MD;  Location: [facility] OR;  Service: Neurosurgery;  Laterality: N/A;   BREAST BIOPSY Right    CHOLECYSTECTOMY     CRANIOTOMY Right 05/18/2021   Procedure: Right craniotomy for tumor resection;  Surgeon: Jadene Pierini, MD;  Location: [facility] OR;  Service: Neurosurgery;  Laterality: Right;   EYE SURGERY     as a child   HERNIA REPAIR     Patient Active Problem List   Diagnosis Date Noted   Abnormal findings on diagnostic imaging of other specified body structures 01/14/2022   Chronic pain 01/14/2022   Colon cancer screening 01/14/2022   Dysphagia 01/14/2022   Family history of malignant neoplasm of digestive organs 01/14/2022   Hyperglycemia due to type 2 diabetes mellitus (HCC) 01/14/2022   Hypertension 01/14/2022   Meralgia paresthetica 01/14/2022   Mixed hyperlipidemia 01/14/2022   Morbid obesity (HCC) 01/14/2022   Overweight 01/14/2022   Pure hypercholesterolemia 01/14/2022   Spinal cord disease (HCC) 01/14/2022   Chronic cough 12/01/2021   Diabetes (HCC) 07/08/2021   Glaucoma 07/08/2021   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   Visual field defect of left  eye 07/08/2021   Debility 06/16/2021   Hypokalemia    Seizures (HCC)    Benign essential HTN    Controlled type 2 diabetes mellitus with hyperglycemia, without long-term current use of insulin (HCC)    Stridor 06/05/2021   Status post tracheostomy (HCC)    Acute respiratory failure with hypoxia (HCC)    Status epilepticus (HCC)    Brain tumor (HCC) 05/18/2021    PCP: Renford Dills, MD  REFERRING PROVIDER: Donalee Citrin, MD  REFERRING DIAG: LS spinal stenosis with neurogenic claudication  Rationale for Evaluation and Treatment: Rehabilitation  THERAPY DIAG:  Muscle weakness (generalized)  Unsteadiness on feet  Difficulty in walking, not elsewhere classified  Other low back pain  ONSET DATE: chronic  SUBJECTIVE:  SUBJECTIVE STATEMENT: My legs and back been hurting. The legs hurt worse than the back. Usually I don't take Tylenol but I had to yesterday because my legs were hurting so bad.    PERTINENT HISTORY:  The patient has history of chronic lower back pain, participated in PT over 4 months ago for similar problem. Reports increased Sx and progression now of pain to ant thighs , was B post thighs and LB.  Referred to PT by neurologist  PAIN:  Are you having pain? Yes: Pain location: central lumbar spine, radiating into thighs, post and ant , to knees  Pain description: shooting deep pain Aggravating factors: moving back in sitting, immediately with standing, worse with prolonged standing and walking Relieving factors: sitting, lying down  PRECAUTIONS: Fall  RED FLAGS: None   WEIGHT BEARING RESTRICTIONS: No  FALLS:  Has patient fallen in last 6 months? No  LIVING ENVIRONMENT: Lives with: lives alone Lives in: House/apartment Stairs:  ramp Has following equipment at home: Single  point cane, Environmental consultant - 2 wheeled, Environmental consultant - 4 wheeled, and Ramped entry  OCCUPATION: works in a church 4 days a week  PLOF: Independent with household mobility with device, Needs assistance with ADLs, and Needs assistance with homemaking  PATIENT GOALS: reduce pain for better standing tolerance  NEXT MD VISIT: unclear  OBJECTIVE:   DIAGNOSTIC FINDINGS:  na  PATIENT SURVEYS:  Modified Oswestry 26/50   SCREENING FOR RED FLAGS: Bowel or bladder incontinence: No Spinal tumors: No Cauda equina syndrome: No Compression fracture: No Abdominal aneurysm: No  COGNITION: Overall cognitive status: Within functional limits for tasks assessed     SENSATION: Light touch intact B LE's today  MUSCLE LENGTH: Hamstrings: Right wfl deg; Left wfl deg Maisie Fus test: Right wfl deg; Left wfl deg  POSTURE:  narrowed stance B, trendelenburg lurch B, with increased sway with gait, increased lumbar lordosis  PALPATION: Non tender with palpation lumbar paraspinals and B post hip musculature  LUMBAR ROM:   AROM eval  Flexion 80%  Extension 50%  Right lateral flexion 50  Left lateral flexion 50  Right rotation   Left rotation    (Blank rows = not tested)  LOWER EXTREMITY ROM:   all LE AAROM WNL B LOWER EXTREMITY MMT:    MMT Right eval Left eval  Hip flexion 3 3-  Hip extension 3+ 3+  Hip abduction 3 3-  Hip adduction    Hip internal rotation    Hip external rotation    Knee flexion    Knee extension 4 4  Ankle dorsiflexion    Ankle plantarflexion 4- 4-  Ankle inversion    Ankle eversion     (Blank rows = not tested)  LUMBAR SPECIAL TESTS:  Straight leg raise test: Negative and FABER test: Negative  FUNCTIONAL TESTS  30 seconds chair stand test 8 reps Timed up and go (TUG): 20.15 2 minute walk test: 90', 1 min 10 sec, stopped due to B thigh pain  GAIT: Distance walked: 67' Assistive device utilized: Single point cane Level of assistance: Modified independence Comments:  fatigued quickly, increased trunk sway and decreased hip control with fatigue  TODAY'S TREATMENT:  DATE:  06/02/23 NuStep L4 x34mins  Hip ext/ abd 2# x10 Marching 2# 20 reps alt  LAQ 2# 2x10 HS curls green 2x10 STS 2x10  Ball squeezes 2x10   PATIENT EDUCATION:  Education details: POC, goals Person educated: Patient Education method: Programmer, multimedia, Facilities manager, Actor cues, Verbal cues, and Handouts Education comprehension: verbalized understanding, returned demonstration, verbal cues required, and tactile cues required  HOME EXERCISE PROGRAM: Access Code: [email or web address] URL: [email or web address] Date: 05/16/2023 Prepared by: Amy Speaks  Exercises - Supine Lower Trunk Rotation  - 1 x daily - 3 x weekly - 3 sets - 10 reps - Hooklying Single Knee to Chest  - 1 x daily - 3 x weekly - 3 sets - 10 reps - Supine Bridge  - 1 x daily - 3 x weekly - 3 sets - 10 reps  ASSESSMENT:  CLINICAL IMPRESSION: Patient is a 67 y.o. female who was evaluated today by physical therapy for a diagnosis of lumbar stenosis with B LE claudication.  She has pain with standing and walking, currently able to improve her pain with position changes, sitting and lying down. Reports she has been doing her HEP and the stretches have been helping. Weakness noted with SL activities and when having to weightbear on one side, mostly the LLE. Will benefit from comprehensive strengthening program for B LE's.  OBJECTIVE IMPAIRMENTS: decreased activity tolerance, decreased balance, decreased endurance, difficulty walking, decreased strength, impaired perceived functional ability, postural dysfunction, and pain.   ACTIVITY LIMITATIONS: carrying, lifting, bending, standing, squatting, stairs, transfers, bathing, and locomotion level  PARTICIPATION LIMITATIONS: cleaning and laundry  PERSONAL  FACTORS: Age, Behavior pattern, Fitness, Past/current experiences, Time since onset of injury/illness/exacerbation, Transportation, and 3+ comorbidities: h/o brain tumor, visual loss , DM  are also affecting patient's functional outcome.   REHAB POTENTIAL: Good  CLINICAL DECISION MAKING: Evolving/moderate complexity  EVALUATION COMPLEXITY: Moderate   GOALS: Goals reviewed with patient? Yes  SHORT TERM GOALS: Target date: 2 weeks   I HEP Baseline:initiated at eval Goal status: INITIAL   LONG TERM GOALS: Target date: 07/11/2023  30 sec sit to stand 11 Baseline: 8 Goal status: INITIAL  2.  TUG improve to 14 or less Baseline: 20.15 Goal status: INITIAL  3.  2 min walk test, 250' or greater Baseline: walked 90 ' in one min and had to stop limited by pain Goal status: INITIAL  4.  Modified oswestry 36/50 Baseline: 26/50 Goal status: INITIAL   PLAN:  PT FREQUENCY: 2x/week  PT DURATION: 8 weeks  PLANNED INTERVENTIONS: Therapeutic exercises, Therapeutic activity, Neuromuscular re-education, Balance training, Gait training, Patient/Family education, Self Care, and Joint mobilization.  PLAN FOR NEXT SESSION: initiate cardiovascular equipment in gym, progress hip, LE strength, stability in supine, standing, sitting.   [place], [place] 06/02/2023, 6:13 PM

## 2023-06-02 ENCOUNTER — Ambulatory Visit: Payer: Medicare Other | Attending: Neurosurgery

## 2023-06-02 DIAGNOSIS — R2681 Unsteadiness on feet: Secondary | ICD-10-CM | POA: Diagnosis present

## 2023-06-02 DIAGNOSIS — M5442 Lumbago with sciatica, left side: Secondary | ICD-10-CM | POA: Insufficient documentation

## 2023-06-02 DIAGNOSIS — G8929 Other chronic pain: Secondary | ICD-10-CM | POA: Insufficient documentation

## 2023-06-02 DIAGNOSIS — R262 Difficulty in walking, not elsewhere classified: Secondary | ICD-10-CM | POA: Diagnosis present

## 2023-06-02 DIAGNOSIS — R278 Other lack of coordination: Secondary | ICD-10-CM | POA: Diagnosis present

## 2023-06-02 DIAGNOSIS — M6281 Muscle weakness (generalized): Secondary | ICD-10-CM | POA: Diagnosis present

## 2023-06-02 DIAGNOSIS — M5441 Lumbago with sciatica, right side: Secondary | ICD-10-CM | POA: Insufficient documentation

## 2023-06-02 DIAGNOSIS — M5459 Other low back pain: Secondary | ICD-10-CM | POA: Diagnosis present

## 2023-06-06 ENCOUNTER — Other Ambulatory Visit: Payer: Self-pay

## 2023-06-06 ENCOUNTER — Ambulatory Visit: Payer: Medicare Other

## 2023-06-06 DIAGNOSIS — M6281 Muscle weakness (generalized): Secondary | ICD-10-CM

## 2023-06-06 DIAGNOSIS — G8929 Other chronic pain: Secondary | ICD-10-CM

## 2023-06-06 DIAGNOSIS — M5459 Other low back pain: Secondary | ICD-10-CM

## 2023-06-06 DIAGNOSIS — R262 Difficulty in walking, not elsewhere classified: Secondary | ICD-10-CM

## 2023-06-06 DIAGNOSIS — R2681 Unsteadiness on feet: Secondary | ICD-10-CM

## 2023-06-06 NOTE — Therapy (Signed)
OUTPATIENT PHYSICAL THERAPY THORACOLUMBAR TREATMENT   Patient Name: Olivia Shah MRN: [id number] DOB:1956/03/20, 67 y.o., female Today's Date: 06/06/2023  END OF SESSION:  PT End of Session - 06/06/23 1111     Visit Number 3    Date for PT Re-Evaluation 07/11/23    Progress Note Due on Visit 10    PT Start Time 1107    PT Stop Time 1147    PT Time Calculation (min) 40 min               Past Medical History:  Diagnosis Date   Arthritis    Diabetes mellitus without complication (HCC)    Hypertension    Past Surgical History:  Procedure Laterality Date   APPLICATION OF CRANIAL NAVIGATION N/A 05/18/2021   Procedure: APPLICATION OF CRANIAL NAVIGATION;  Surgeon: Jadene Pierini, MD;  Location: [facility] OR;  Service: Neurosurgery;  Laterality: N/A;   BREAST BIOPSY Right    CHOLECYSTECTOMY     CRANIOTOMY Right 05/18/2021   Procedure: Right craniotomy for tumor resection;  Surgeon: Jadene Pierini, MD;  Location: [facility] OR;  Service: Neurosurgery;  Laterality: Right;   EYE SURGERY     as a child   HERNIA REPAIR     Patient Active Problem List   Diagnosis Date Noted   Abnormal findings on diagnostic imaging of other specified body structures 01/14/2022   Chronic pain 01/14/2022   Colon cancer screening 01/14/2022   Dysphagia 01/14/2022   Family history of malignant neoplasm of digestive organs 01/14/2022   Hyperglycemia due to type 2 diabetes mellitus (HCC) 01/14/2022   Hypertension 01/14/2022   Meralgia paresthetica 01/14/2022   Mixed hyperlipidemia 01/14/2022   Morbid obesity (HCC) 01/14/2022   Overweight 01/14/2022   Pure hypercholesterolemia 01/14/2022   Spinal cord disease (HCC) 01/14/2022   Chronic cough 12/01/2021   Diabetes (HCC) 07/08/2021   Glaucoma 07/08/2021   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   Visual field defect of left eye 07/08/2021   Debility 06/16/2021   Hypokalemia    Seizures (HCC)    Benign essential HTN    Controlled type 2 diabetes mellitus  with hyperglycemia, without long-term current use of insulin (HCC)    Stridor 06/05/2021   Status post tracheostomy (HCC)    Acute respiratory failure with hypoxia (HCC)    Status epilepticus (HCC)    Brain tumor (HCC) 05/18/2021    PCP: Renford Dills, MD  REFERRING PROVIDER: Donalee Citrin, MD  REFERRING DIAG: LS spinal stenosis with neurogenic claudication  Rationale for Evaluation and Treatment: Rehabilitation  THERAPY DIAG:  Muscle weakness (generalized)  Unsteadiness on feet  Difficulty in walking, not elsewhere classified  Chronic bilateral low back pain with bilateral sciatica  Other low back pain  ONSET DATE: chronic  SUBJECTIVE:  SUBJECTIVE STATEMENT: My legs and back been hurting. I think the PT helps, but my thighs still hurt in the back when I stand up PERTINENT HISTORY:  The patient has history of chronic lower back pain, participated in PT over 4 months ago for similar problem. Reports increased Sx and progression now of pain to ant thighs , was B post thighs and LB.  Referred to PT by neurologist  PAIN:  Are you having pain? Yes: Pain location: central lumbar spine, radiating into thighs, post and ant , to knees  Pain description: shooting deep pain Aggravating factors: moving back in sitting, immediately with standing, worse with prolonged standing and walking Relieving factors: sitting, lying down  PRECAUTIONS: Fall  RED FLAGS: None   WEIGHT BEARING RESTRICTIONS: No  FALLS:  Has patient fallen in last 6 months? No  LIVING ENVIRONMENT: Lives with: lives alone Lives in: House/apartment Stairs:  ramp Has following equipment at home: Single point cane, Environmental consultant - 2 wheeled, Environmental consultant - 4 wheeled, and Ramped entry  OCCUPATION: works in a church 4 days a week  PLOF:  Independent with household mobility with device, Needs assistance with ADLs, and Needs assistance with homemaking  PATIENT GOALS: reduce pain for better standing tolerance  NEXT MD VISIT: unclear  OBJECTIVE:   DIAGNOSTIC FINDINGS:  na  PATIENT SURVEYS:  Modified Oswestry 26/50   SCREENING FOR RED FLAGS: Bowel or bladder incontinence: No Spinal tumors: No Cauda equina syndrome: No Compression fracture: No Abdominal aneurysm: No  COGNITION: Overall cognitive status: Within functional limits for tasks assessed     SENSATION: Light touch intact B LE's today  MUSCLE LENGTH: Hamstrings: Right wfl deg; Left wfl deg Maisie Fus test: Right wfl deg; Left wfl deg  POSTURE:  narrowed stance B, trendelenburg lurch B, with increased sway with gait, increased lumbar lordosis  PALPATION: Non tender with palpation lumbar paraspinals and B post hip musculature  LUMBAR ROM:   AROM eval  Flexion 80%  Extension 50%  Right lateral flexion 50  Left lateral flexion 50  Right rotation   Left rotation    (Blank rows = not tested)  LOWER EXTREMITY ROM:   all LE AAROM WNL B LOWER EXTREMITY MMT:    MMT Right eval Left eval  Hip flexion 3 3-  Hip extension 3+ 3+  Hip abduction 3 3-  Hip adduction    Hip internal rotation    Hip external rotation    Knee flexion    Knee extension 4 4  Ankle dorsiflexion    Ankle plantarflexion 4- 4-  Ankle inversion    Ankle eversion     (Blank rows = not tested)  LUMBAR SPECIAL TESTS:  Straight leg raise test: Negative and FABER test: Negative  FUNCTIONAL TESTS  30 seconds chair stand test 8 reps Timed up and go (TUG): 20.15 2 minute walk test: 90', 1 min 10 sec, stopped due to B thigh pain  GAIT: Distance walked: 38' Assistive device utilized: Single point cane Level of assistance: Modified independence Comments: fatigued quickly, increased trunk sway and decreased hip control with fatigue  TODAY'S TREATMENT:  DATE:  06/06/23: Supine for physioball LTR B knee to chest legs on physioball Bridging thighs over physioball Sidelying clam shells, 15 reps each side Nustep, level 4, Ue's and LE's, 6 min Standing with 2# cuff wts on ankles, for side stepping length of mat, mat elevated for pt to use for UE support Standing for hip ext alt hips with 2 # cuff wt  Forward step ups on 4" step, 10 x each leg  06/02/23 NuStep L4 x66mins  Hip ext/ abd 2# x10 Marching 2# 20 reps alt  LAQ 2# 2x10 HS curls green 2x10 STS 2x10  Ball squeezes 2x10   PATIENT EDUCATION:  Education details: POC, goals Person educated: Patient Education method: Programmer, multimedia, Facilities manager, Actor cues, Verbal cues, and Handouts Education comprehension: verbalized understanding, returned demonstration, verbal cues required, and tactile cues required  HOME EXERCISE PROGRAM: Access Code: [email or web address] URL: [email or web address] Date: 05/16/2023 Prepared by: Arie Gable  Exercises - Supine Lower Trunk Rotation  - 1 x daily - 3 x weekly - 3 sets - 10 reps - Hooklying Single Knee to Chest  - 1 x daily - 3 x weekly - 3 sets - 10 reps - Supine Bridge  - 1 x daily - 3 x weekly - 3 sets - 10 reps  ASSESSMENT:  CLINICAL IMPRESSION: Patient is a 67 y.o. female who participated today in skilled physical therapy treatment due to diagnosis of lumbar stenosis with B LE claudication.  She has pain with standing and walking, currently able to improve her pain with position changes, sitting and lying down. Reports she has been doing her HEP and the stretches have been helping. Advanced  intensity of hip strengthening ex.Will benefit from comprehensive strengthening program for B LE's.  OBJECTIVE IMPAIRMENTS: decreased activity tolerance, decreased balance, decreased endurance, difficulty walking, decreased strength, impaired perceived  functional ability, postural dysfunction, and pain.   ACTIVITY LIMITATIONS: carrying, lifting, bending, standing, squatting, stairs, transfers, bathing, and locomotion level  PARTICIPATION LIMITATIONS: cleaning and laundry  PERSONAL FACTORS: Age, Behavior pattern, Fitness, Past/current experiences, Time since onset of injury/illness/exacerbation, Transportation, and 3+ comorbidities: h/o brain tumor, visual loss , DM  are also affecting patient's functional outcome.   REHAB POTENTIAL: Good  CLINICAL DECISION MAKING: Evolving/moderate complexity  EVALUATION COMPLEXITY: Moderate   GOALS: Goals reviewed with patient? Yes  SHORT TERM GOALS: Target date: 2 weeks   I HEP Baseline:initiated at eval Goal status: INITIAL   LONG TERM GOALS: Target date: 07/11/2023  30 sec sit to stand 11 Baseline: 8 Goal status: INITIAL  2.  TUG improve to 14 or less Baseline: 20.15 Goal status: INITIAL  3.  2 min walk test, 250' or greater Baseline: walked 90 ' in one min and had to stop limited by pain Goal status: INITIAL  4.  Modified oswestry 36/50 Baseline: 26/50 Goal status: INITIAL   PLAN:  PT FREQUENCY: 2x/week  PT DURATION: 8 weeks  PLANNED INTERVENTIONS: Therapeutic exercises, Therapeutic activity, Neuromuscular re-education, Balance training, Gait training, Patient/Family education, Self Care, and Joint mobilization.  PLAN FOR NEXT SESSION: initiate cardiovascular equipment in gym, progress hip, LE strength, stability in supine, standing, sitting.   Aaric Dolph L Kaleab Frasier, PT, DPT, OCS 06/06/2023, 2:15 PM

## 2023-06-13 ENCOUNTER — Ambulatory Visit: Payer: Medicare Other

## 2023-06-13 ENCOUNTER — Other Ambulatory Visit: Payer: Self-pay

## 2023-06-13 DIAGNOSIS — G8929 Other chronic pain: Secondary | ICD-10-CM

## 2023-06-13 DIAGNOSIS — M6281 Muscle weakness (generalized): Secondary | ICD-10-CM | POA: Diagnosis not present

## 2023-06-13 DIAGNOSIS — R262 Difficulty in walking, not elsewhere classified: Secondary | ICD-10-CM

## 2023-06-13 DIAGNOSIS — R2681 Unsteadiness on feet: Secondary | ICD-10-CM

## 2023-06-13 NOTE — Therapy (Signed)
OUTPATIENT PHYSICAL THERAPY THORACOLUMBAR TREATMENT   Patient Name: Olivia Shah MRN: [id number] DOB:1956/05/06, 67 y.o., female Today's Date: 06/13/2023  END OF SESSION:  PT End of Session - 06/13/23 1017     Visit Number 4    Date for PT Re-Evaluation 07/11/23    Progress Note Due on Visit 10    PT Start Time 1015    PT Stop Time 1100    PT Time Calculation (min) 45 min    Activity Tolerance Patient tolerated treatment well    Behavior During Therapy WFL for tasks assessed/performed                Past Medical History:  Diagnosis Date   Arthritis    Diabetes mellitus without complication (HCC)    Hypertension    Past Surgical History:  Procedure Laterality Date   APPLICATION OF CRANIAL NAVIGATION N/A 05/18/2021   Procedure: APPLICATION OF CRANIAL NAVIGATION;  Surgeon: Jadene Pierini, MD;  Location: [facility] OR;  Service: Neurosurgery;  Laterality: N/A;   BREAST BIOPSY Right    CHOLECYSTECTOMY     CRANIOTOMY Right 05/18/2021   Procedure: Right craniotomy for tumor resection;  Surgeon: Jadene Pierini, MD;  Location: [facility] OR;  Service: Neurosurgery;  Laterality: Right;   EYE SURGERY     as a child   HERNIA REPAIR     Patient Active Problem List   Diagnosis Date Noted   Abnormal findings on diagnostic imaging of other specified body structures 01/14/2022   Chronic pain 01/14/2022   Colon cancer screening 01/14/2022   Dysphagia 01/14/2022   Family history of malignant neoplasm of digestive organs 01/14/2022   Hyperglycemia due to type 2 diabetes mellitus (HCC) 01/14/2022   Hypertension 01/14/2022   Meralgia paresthetica 01/14/2022   Mixed hyperlipidemia 01/14/2022   Morbid obesity (HCC) 01/14/2022   Overweight 01/14/2022   Pure hypercholesterolemia 01/14/2022   Spinal cord disease (HCC) 01/14/2022   Chronic cough 12/01/2021   Diabetes (HCC) 07/08/2021   Glaucoma 07/08/2021   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   Visual field defect of left eye 07/08/2021    Debility 06/16/2021   Hypokalemia    Seizures (HCC)    Benign essential HTN    Controlled type 2 diabetes mellitus with hyperglycemia, without long-term current use of insulin (HCC)    Stridor 06/05/2021   Status post tracheostomy (HCC)    Acute respiratory failure with hypoxia (HCC)    Status epilepticus (HCC)    Brain tumor (HCC) 05/18/2021    PCP: Renford Dills, MD  REFERRING PROVIDER: Donalee Citrin, MD  REFERRING DIAG: LS spinal stenosis with neurogenic claudication  Rationale for Evaluation and Treatment: Rehabilitation  THERAPY DIAG:  Muscle weakness (generalized)  Unsteadiness on feet  Difficulty in walking, not elsewhere classified  Chronic bilateral low back pain with bilateral sciatica  ONSET DATE: chronic  SUBJECTIVE:  SUBJECTIVE STATEMENT: My R leg is sore today, not sure why.  I am doing the ex that you gave me for home, they help  PERTINENT HISTORY:  The patient has history of chronic lower back pain, participated in PT over 4 months ago for similar problem. Reports increased Sx and progression now of pain to ant thighs , was B post thighs and LB.  Referred to PT by neurologist  PAIN:  Are you having pain? Yes: Pain location: central lumbar spine, radiating into thighs, post and ant , to knees  Pain description: shooting deep pain Aggravating factors: moving back in sitting, immediately with standing, worse with prolonged standing and walking Relieving factors: sitting, lying down  PRECAUTIONS: Fall  RED FLAGS: None   WEIGHT BEARING RESTRICTIONS: No  FALLS:  Has patient fallen in last 6 months? No  LIVING ENVIRONMENT: Lives with: lives alone Lives in: House/apartment Stairs:  ramp Has following equipment at home: Single point cane, Environmental consultant - 2 wheeled, Environmental consultant - 4  wheeled, and Ramped entry  OCCUPATION: works in a church 4 days a week  PLOF: Independent with household mobility with device, Needs assistance with ADLs, and Needs assistance with homemaking  PATIENT GOALS: reduce pain for better standing tolerance  NEXT MD VISIT: unclear  OBJECTIVE:   DIAGNOSTIC FINDINGS:  na  PATIENT SURVEYS:  Modified Oswestry 26/50   SCREENING FOR RED FLAGS: Bowel or bladder incontinence: No Spinal tumors: No Cauda equina syndrome: No Compression fracture: No Abdominal aneurysm: No  COGNITION: Overall cognitive status: Within functional limits for tasks assessed     SENSATION: Light touch intact B LE's today  MUSCLE LENGTH: Hamstrings: Right wfl deg; Left wfl deg Maisie Fus test: Right wfl deg; Left wfl deg  POSTURE:  narrowed stance B, trendelenburg lurch B, with increased sway with gait, increased lumbar lordosis  PALPATION: Non tender with palpation lumbar paraspinals and B post hip musculature  LUMBAR ROM:   AROM eval  Flexion 80%  Extension 50%  Right lateral flexion 50  Left lateral flexion 50  Right rotation   Left rotation    (Blank rows = not tested)  LOWER EXTREMITY ROM:   all LE AAROM WNL B LOWER EXTREMITY MMT:    MMT Right eval Left eval  Hip flexion 3 3-  Hip extension 3+ 3+  Hip abduction 3 3-  Hip adduction    Hip internal rotation    Hip external rotation    Knee flexion    Knee extension 4 4  Ankle dorsiflexion    Ankle plantarflexion 4- 4-  Ankle inversion    Ankle eversion     (Blank rows = not tested)  LUMBAR SPECIAL TESTS:  Straight leg raise test: Negative and FABER test: Negative  FUNCTIONAL TESTS  30 seconds chair stand test 8 reps Timed up and go (TUG): 20.15 2 minute walk test: 90', 1 min 10 sec, stopped due to B thigh pain  GAIT: Distance walked: 68' Assistive device utilized: Single point cane Level of assistance: Modified independence Comments: fatigued quickly, increased trunk sway and  decreased hip control with fatigue  TODAY'S TREATMENT:  DATE: 06/13/23: Nustep level 5, 6 min  Supine for physioball LTR B knee to chest legs on physioball Bridging thighs over physioball Alt SLR with thighs on physioball Side lying clam shell, light manual resistance by PT B knee extension, 5#, 2 sets 10 reps B knee flexion 20#, 2 sets 10 reps Side stepping along elevated mat table, 10 x with 2# cuff weights on each ankle Standing for alt hip ext , with knee extending, hands on elevated mat, 10 x each with 2# cuff wts.  06/06/23: Supine for physioball LTR B knee to chest legs on physioball Bridging thighs over physioball Sidelying clam shells, 15 reps each side Nustep, level 4, Ue's and LE's, 6 min Standing with 2# cuff wts on ankles, for side stepping length of mat, mat elevated for pt to use for UE support Standing for hip ext alt hips with 2 # cuff wt  Forward step ups on 4" step, 10 x each leg  06/02/23 NuStep L4 x79mins  Hip ext/ abd 2# x10 Marching 2# 20 reps alt  LAQ 2# 2x10 HS curls green 2x10 STS 2x10  Ball squeezes 2x10   PATIENT EDUCATION:  Education details: POC, goals Person educated: Patient Education method: Programmer, multimedia, Facilities manager, Actor cues, Verbal cues, and Handouts Education comprehension: verbalized understanding, returned demonstration, verbal cues required, and tactile cues required  HOME EXERCISE PROGRAM: Access Code: [email or web address] URL: [email or web address] Date: 05/16/2023 Prepared by: Ayrton Mcvay  Exercises - Supine Lower Trunk Rotation  - 1 x daily - 3 x weekly - 3 sets - 10 reps - Hooklying Single Knee to Chest  - 1 x daily - 3 x weekly - 3 sets - 10 reps - Supine Bridge  - 1 x daily - 3 x weekly - 3 sets - 10 reps  ASSESSMENT:  CLINICAL IMPRESSION: Patient is a 68 y.o. female who participated today in  skilled physical therapy treatment due to diagnosis of lumbar stenosis with B LE claudication.  She has pain with standing and walking, currently able to improve her pain with position changes, sitting and lying down. Reports she has been doing her HEP and the stretches have been helping. Advanced  intensity of hip strengthening ex and quads, hams strengthening today.  Will benefit from comprehensive strengthening program for B LE's.  OBJECTIVE IMPAIRMENTS: decreased activity tolerance, decreased balance, decreased endurance, difficulty walking, decreased strength, impaired perceived functional ability, postural dysfunction, and pain.   ACTIVITY LIMITATIONS: carrying, lifting, bending, standing, squatting, stairs, transfers, bathing, and locomotion level  PARTICIPATION LIMITATIONS: cleaning and laundry  PERSONAL FACTORS: Age, Behavior pattern, Fitness, Past/current experiences, Time since onset of injury/illness/exacerbation, Transportation, and 3+ comorbidities: h/o brain tumor, visual loss , DM  are also affecting patient's functional outcome.   REHAB POTENTIAL: Good  CLINICAL DECISION MAKING: Evolving/moderate complexity  EVALUATION COMPLEXITY: Moderate   GOALS: Goals reviewed with patient? Yes  SHORT TERM GOALS: Target date: 2 weeks   I HEP Baseline:initiated at eval Goal status: INITIAL   LONG TERM GOALS: Target date: 07/11/2023  30 sec sit to stand 11 Baseline: 8 Goal status: INITIAL  2.  TUG improve to 14 or less Baseline: 20.15 Goal status: INITIAL  3.  2 min walk test, 250' or greater Baseline: walked 90 ' in one min and had to stop limited by pain Goal status: INITIAL  4.  Modified oswestry 36/50 Baseline: 26/50 Goal status: INITIAL   PLAN:  PT FREQUENCY: 2x/week  PT DURATION: 8 weeks  PLANNED INTERVENTIONS: Therapeutic exercises, Therapeutic activity, Neuromuscular  re-education, Balance training, Gait training, Patient/Family education, Self Care, and  Joint mobilization.  PLAN FOR NEXT SESSION: initiate cardiovascular equipment in gym, progress hip, LE strength, stability in supine, standing, sitting.   Dreyden Rohrman L Promise Bushong, PT, DPT, OCS 06/13/2023, 11:00 AM

## 2023-06-16 ENCOUNTER — Ambulatory Visit: Payer: Medicare Other | Admitting: Physical Therapy

## 2023-06-16 DIAGNOSIS — R2681 Unsteadiness on feet: Secondary | ICD-10-CM

## 2023-06-16 DIAGNOSIS — M6281 Muscle weakness (generalized): Secondary | ICD-10-CM

## 2023-06-16 DIAGNOSIS — G8929 Other chronic pain: Secondary | ICD-10-CM

## 2023-06-16 DIAGNOSIS — R262 Difficulty in walking, not elsewhere classified: Secondary | ICD-10-CM

## 2023-06-16 NOTE — Therapy (Signed)
OUTPATIENT PHYSICAL THERAPY THORACOLUMBAR TREATMENT   Patient Name: Olivia Shah MRN: [id number] DOB:29-Jan-1956, 67 y.o., female Today's Date: 06/16/2023  END OF SESSION:  PT End of Session - 06/16/23 1659     Visit Number 5    Date for PT Re-Evaluation 07/11/23    PT Start Time 1655    PT Stop Time 1745    PT Time Calculation (min) 50 min                Past Medical History:  Diagnosis Date   Arthritis    Diabetes mellitus without complication (HCC)    Hypertension    Past Surgical History:  Procedure Laterality Date   APPLICATION OF CRANIAL NAVIGATION N/A 05/18/2021   Procedure: APPLICATION OF CRANIAL NAVIGATION;  Surgeon: Jadene Pierini, MD;  Location: [facility] OR;  Service: Neurosurgery;  Laterality: N/A;   BREAST BIOPSY Right    CHOLECYSTECTOMY     CRANIOTOMY Right 05/18/2021   Procedure: Right craniotomy for tumor resection;  Surgeon: Jadene Pierini, MD;  Location: [facility] OR;  Service: Neurosurgery;  Laterality: Right;   EYE SURGERY     as a child   HERNIA REPAIR     Patient Active Problem List   Diagnosis Date Noted   Abnormal findings on diagnostic imaging of other specified body structures 01/14/2022   Chronic pain 01/14/2022   Colon cancer screening 01/14/2022   Dysphagia 01/14/2022   Family history of malignant neoplasm of digestive organs 01/14/2022   Hyperglycemia due to type 2 diabetes mellitus (HCC) 01/14/2022   Hypertension 01/14/2022   Meralgia paresthetica 01/14/2022   Mixed hyperlipidemia 01/14/2022   Morbid obesity (HCC) 01/14/2022   Overweight 01/14/2022   Pure hypercholesterolemia 01/14/2022   Spinal cord disease (HCC) 01/14/2022   Chronic cough 12/01/2021   Diabetes (HCC) 07/08/2021   Glaucoma 07/08/2021   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   Visual field defect of left eye 07/08/2021   Debility 06/16/2021   Hypokalemia    Seizures (HCC)    Benign essential HTN    Controlled type 2 diabetes mellitus with hyperglycemia, without  long-term current use of insulin (HCC)    Stridor 06/05/2021   Status post tracheostomy (HCC)    Acute respiratory failure with hypoxia (HCC)    Status epilepticus (HCC)    Brain tumor (HCC) 05/18/2021    PCP: Renford Dills, MD  REFERRING PROVIDER: Donalee Citrin, MD  REFERRING DIAG: LS spinal stenosis with neurogenic claudication  Rationale for Evaluation and Treatment: Rehabilitation  THERAPY DIAG:  Muscle weakness (generalized)  Unsteadiness on feet  Difficulty in walking, not elsewhere classified  Chronic bilateral low back pain with bilateral sciatica  ONSET DATE: chronic  SUBJECTIVE:  SUBJECTIVE STATEMENT: My R leg is sore today, in front- points to groin and thigh. Pt states pain moves from leg to leg  PERTINENT HISTORY:  The patient has history of chronic lower back pain, participated in PT over 4 months ago for similar problem. Reports increased Sx and progression now of pain to ant thighs , was B post thighs and LB.  Referred to PT by neurologist  PAIN:  Are you having pain? 8/10 moves around in LE PRECAUTIONS: Fall  RED FLAGS: None   WEIGHT BEARING RESTRICTIONS: No  FALLS:  Has patient fallen in last 6 months? No  LIVING ENVIRONMENT: Lives with: lives alone Lives in: House/apartment Stairs:  ramp Has following equipment at home: Single point cane, Environmental consultant - 2 wheeled, Environmental consultant - 4 wheeled, and Ramped entry  OCCUPATION: works in a church 4 days a week  PLOF: Independent with household mobility with device, Needs assistance with ADLs, and Needs assistance with homemaking  PATIENT GOALS: reduce pain for better standing tolerance  NEXT MD VISIT: unclear  OBJECTIVE:   DIAGNOSTIC FINDINGS:  na  PATIENT SURVEYS:  Modified Oswestry 26/50   SCREENING FOR RED FLAGS: Bowel  or bladder incontinence: No Spinal tumors: No Cauda equina syndrome: No Compression fracture: No Abdominal aneurysm: No  COGNITION: Overall cognitive status: Within functional limits for tasks assessed     SENSATION: Light touch intact B LE's today  MUSCLE LENGTH: Hamstrings: Right wfl deg; Left wfl deg Maisie Fus test: Right wfl deg; Left wfl deg  POSTURE:  narrowed stance B, trendelenburg lurch B, with increased sway with gait, increased lumbar lordosis  PALPATION: Non tender with palpation lumbar paraspinals and B post hip musculature  LUMBAR ROM:   AROM eval  Flexion 80%  Extension 50%  Right lateral flexion 50  Left lateral flexion 50  Right rotation   Left rotation    (Blank rows = not tested)  LOWER EXTREMITY ROM:   all LE AAROM WNL B LOWER EXTREMITY MMT:    MMT Right eval Left eval  Hip flexion 3 3-  Hip extension 3+ 3+  Hip abduction 3 3-  Hip adduction    Hip internal rotation    Hip external rotation    Knee flexion    Knee extension 4 4  Ankle dorsiflexion    Ankle plantarflexion 4- 4-  Ankle inversion    Ankle eversion     (Blank rows = not tested)  LUMBAR SPECIAL TESTS:  Straight leg raise test: Negative and FABER test: Negative  FUNCTIONAL TESTS  30 seconds chair stand test 8 reps Timed up and go (TUG): 20.15 2 minute walk test: 90', 1 min 10 sec, stopped due to B thigh pain  GAIT: Distance walked: 44' Assistive device utilized: Single point cane Level of assistance: Modified independence Comments: fatigued quickly, increased trunk sway and decreased hip control with fatigue  TODAY'S TREATMENT:  DATE:  06/16/23 Nustep L 5 Bridge hold 3 sec 2 sets 10 Green tband hip flex and clams 2 sets 10 Iso abdominal press 2 sets 10 hold 3 sec STS with wt ball press 10 x Standing wt ball ext ( post radiating pain)and  rotation 10 x each HHA red tband SL hip flex,ext and abd 10 x each- weakness with hip ABD Seated hip abd red tband 2 sets 10 Educ on arthritis and  need for strengthening     06/13/23: Nustep level 5, 6 min  Supine for physioball LTR B knee to chest legs on physioball Bridging thighs over physioball Alt SLR with thighs on physioball Side lying clam shell, light manual resistance by PT B knee extension, 5#, 2 sets 10 reps B knee flexion 20#, 2 sets 10 reps Side stepping along elevated mat table, 10 x with 2# cuff weights on each ankle Standing for alt hip ext , with knee extending, hands on elevated mat, 10 x each with 2# cuff wts.  06/06/23: Supine for physioball LTR B knee to chest legs on physioball Bridging thighs over physioball Sidelying clam shells, 15 reps each side Nustep, level 4, Ue's and LE's, 6 min Standing with 2# cuff wts on ankles, for side stepping length of mat, mat elevated for pt to use for UE support Standing for hip ext alt hips with 2 # cuff wt  Forward step ups on 4" step, 10 x each leg  06/02/23 NuStep L4 x52mins  Hip ext/ abd 2# x10 Marching 2# 20 reps alt  LAQ 2# 2x10 HS curls green 2x10 STS 2x10  Ball squeezes 2x10   PATIENT EDUCATION:  Education details: POC, goals Person educated: Patient Education method: Programmer, multimedia, Facilities manager, Actor cues, Verbal cues, and Handouts Education comprehension: verbalized understanding, returned demonstration, verbal cues required, and tactile cues required  HOME EXERCISE PROGRAM: Access Code: [email or web address] URL: [email or web address] Date: 05/16/2023 Prepared by: Amy Speaks  Exercises - Supine Lower Trunk Rotation  - 1 x daily - 3 x weekly - 3 sets - 10 reps - Hooklying Single Knee to Chest  - 1 x daily - 3 x weekly - 3 sets - 10 reps - Supine Bridge  - 1 x daily - 3 x weekly - 3 sets - 10 reps  ASSESSMENT:  CLINICAL IMPRESSION: Arthritis education. Pt with variable LE pain that changes  location. Focus on core strength. Hip weakness and some c/o leg pain. Cuing with ex to prevent compensation. OBJECTIVE IMPAIRMENTS: decreased activity tolerance, decreased balance, decreased endurance, difficulty walking, decreased strength, impaired perceived functional ability, postural dysfunction, and pain.   ACTIVITY LIMITATIONS: carrying, lifting, bending, standing, squatting, stairs, transfers, bathing, and locomotion level  PARTICIPATION LIMITATIONS: cleaning and laundry  PERSONAL FACTORS: Age, Behavior pattern, Fitness, Past/current experiences, Time since onset of injury/illness/exacerbation, Transportation, and 3+ comorbidities: h/o brain tumor, visual loss , DM  are also affecting patient's functional outcome.   REHAB POTENTIAL: Good  CLINICAL DECISION MAKING: Evolving/moderate complexity  EVALUATION COMPLEXITY: Moderate   GOALS: Goals reviewed with patient? Yes  SHORT TERM GOALS: Target date: 2 weeks   I HEP Baseline:initiated at eval Goal status: 06/16/23 MET   LONG TERM GOALS: Target date: 07/11/2023  30 sec sit to stand 11 Baseline: 8 Goal status: INITIAL  2.  TUG improve to 14 or less Baseline: 20.15 Goal status: INITIAL  3.  2 min walk test, 250' or greater Baseline: walked 90 ' in one min and had to stop limited by pain Goal  status: INITIAL  4.  Modified oswestry 36/50 Baseline: 26/50 Goal status: INITIAL   PLAN:  PT FREQUENCY: 2x/week  PT DURATION: 8 weeks  PLANNED INTERVENTIONS: Therapeutic exercises, Therapeutic activity, Neuromuscular re-education, Balance training, Gait training, Patient/Family education, Self Care, and Joint mobilization.  PLAN FOR NEXT SESSION: initiate cardiovascular equipment in gym, progress hip, LE strength, stability in supine, standing, sitting.   Kishaun Erekson,ANGIE, PTA,  06/16/2023, 4:59 PM [place] [facility] at [facility] [address] [place], [place], [address] Phone:  [phone]   Fax:  [phone]  Patient Details  Name: SHANISE KLAUER MRN: [id number] Date of Birth: May 23, 1956 Referring Provider:  Donalee Citrin, MD  Encounter Date: 06/16/2023   Suanne Marker, PTA 06/16/2023, 4:59 PM  [place] [facility] at [facility] [address] [place], [place], [address] Phone: [phone]   Fax:  [phone]

## 2023-06-20 ENCOUNTER — Ambulatory Visit: Payer: Medicare Other

## 2023-06-20 ENCOUNTER — Ambulatory Visit (INDEPENDENT_AMBULATORY_CARE_PROVIDER_SITE_OTHER): Payer: Medicare Other | Admitting: Podiatry

## 2023-06-20 DIAGNOSIS — G8929 Other chronic pain: Secondary | ICD-10-CM

## 2023-06-20 DIAGNOSIS — M79674 Pain in right toe(s): Secondary | ICD-10-CM

## 2023-06-20 DIAGNOSIS — M79675 Pain in left toe(s): Secondary | ICD-10-CM

## 2023-06-20 DIAGNOSIS — M6281 Muscle weakness (generalized): Secondary | ICD-10-CM | POA: Diagnosis not present

## 2023-06-20 DIAGNOSIS — M5459 Other low back pain: Secondary | ICD-10-CM

## 2023-06-20 DIAGNOSIS — E119 Type 2 diabetes mellitus without complications: Secondary | ICD-10-CM

## 2023-06-20 DIAGNOSIS — B351 Tinea unguium: Secondary | ICD-10-CM | POA: Diagnosis not present

## 2023-06-20 DIAGNOSIS — R2681 Unsteadiness on feet: Secondary | ICD-10-CM

## 2023-06-20 DIAGNOSIS — R262 Difficulty in walking, not elsewhere classified: Secondary | ICD-10-CM

## 2023-06-20 DIAGNOSIS — R278 Other lack of coordination: Secondary | ICD-10-CM

## 2023-06-20 NOTE — Therapy (Signed)
squatting, stairs, transfers, bathing, and locomotion level  PARTICIPATION LIMITATIONS: cleaning and laundry  PERSONAL FACTORS: Age, Behavior pattern, Fitness, Past/current experiences, Time since onset of injury/illness/exacerbation, Transportation, and 3+ comorbidities: h/o brain tumor, visual loss , DM  are also affecting patient's functional outcome.   REHAB POTENTIAL: Good  CLINICAL DECISION MAKING: Evolving/moderate complexity  EVALUATION COMPLEXITY: Moderate   GOALS: Goals reviewed with patient? Yes  SHORT TERM GOALS: Target  date: 2 weeks   I HEP Baseline:initiated at eval Goal status: 06/16/23 MET   LONG TERM GOALS: Target date: 07/11/2023  30 sec sit to stand 11 Baseline: 8 Goal status: INITIAL  2.  TUG improve to 14 or less Baseline: 20.15 Goal status: INITIAL  3.  2 min walk test, 250' or greater Baseline: walked 90 ' in one min and had to stop limited by pain Goal status: INITIAL  4.  Modified oswestry 36/50 Baseline: 26/50 Goal status: INITIAL   PLAN:  PT FREQUENCY: 2x/week  PT DURATION: 8 weeks  PLANNED INTERVENTIONS: Therapeutic exercises, Therapeutic activity, Neuromuscular re-education, Balance training, Gait training, Patient/Family education, Self Care, and Joint mobilization.  PLAN FOR NEXT SESSION: initiate cardiovascular equipment in gym, progress hip, LE strength, stability in supine, standing, sitting.   Early Chars, PT, DPT, OCS 06/20/2023, 11:32 AM [facility] at [facility] [address] [facility] [place], [place], [address] Phone: [phone]   Fax:  [phone]  Patient Details  Name: Olivia Shah MRN: [id number] Date of Birth: 01/02/1956 Referring Provider:  Donalee Citrin, MD  Encounter Date: 06/20/2023   Early Chars, PT 06/20/2023, 11:32 AM  [place] [facility] at [facility] [address] [place], [place], [address] Phone: [phone]   Fax:  [phone]  OUTPATIENT PHYSICAL THERAPY THORACOLUMBAR TREATMENT   Patient Name: Olivia Shah MRN: [id number] DOB:10-30-1955, 67 y.o., female Today's Date: 06/20/2023  END OF SESSION:  PT End of Session - 06/20/23 1048     Visit Number 6    Date for PT Re-Evaluation 07/11/23    Progress Note Due on Visit 10    PT Start Time 1045    PT Stop Time 1130    PT Time Calculation (min) 45 min    Activity Tolerance Patient tolerated treatment well    Behavior During Therapy [facility] for tasks assessed/performed                 Past Medical History:  Diagnosis Date   Arthritis    Diabetes mellitus without complication (HCC)    Hypertension    Past Surgical History:  Procedure Laterality Date   APPLICATION OF CRANIAL NAVIGATION N/A 05/18/2021   Procedure: APPLICATION OF CRANIAL NAVIGATION;  Surgeon: Jadene Pierini, MD;  Location: [facility] OR;  Service: Neurosurgery;  Laterality: N/A;   BREAST BIOPSY Right    CHOLECYSTECTOMY     CRANIOTOMY Right 05/18/2021   Procedure: Right craniotomy for tumor resection;  Surgeon: Jadene Pierini, MD;  Location: [facility] OR;  Service: Neurosurgery;  Laterality: Right;   EYE SURGERY     as a child   HERNIA REPAIR     Patient Active Problem List   Diagnosis Date Noted   Abnormal findings on diagnostic imaging of other specified body structures 01/14/2022   Chronic pain 01/14/2022   Colon cancer screening 01/14/2022   Dysphagia 01/14/2022   Family history of malignant neoplasm of digestive organs 01/14/2022   Hyperglycemia due to type 2 diabetes mellitus (HCC) 01/14/2022   Hypertension 01/14/2022   Meralgia paresthetica 01/14/2022   Mixed hyperlipidemia 01/14/2022   Morbid obesity (HCC) 01/14/2022   Overweight 01/14/2022   Pure hypercholesterolemia 01/14/2022   Spinal cord disease (HCC) 01/14/2022   Chronic cough 12/01/2021   Diabetes (HCC) 07/08/2021   Glaucoma 07/08/2021   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   Visual field defect of left eye 07/08/2021    Debility 06/16/2021   Hypokalemia    Seizures (HCC)    Benign essential HTN    Controlled type 2 diabetes mellitus with hyperglycemia, without long-term current use of insulin (HCC)    Stridor 06/05/2021   Status post tracheostomy (HCC)    Acute respiratory failure with hypoxia (HCC)    Status epilepticus (HCC)    Brain tumor (HCC) 05/18/2021    PCP: Renford Dills, MD  REFERRING PROVIDER: Donalee Citrin, MD  REFERRING DIAG: LS spinal stenosis with neurogenic claudication  Rationale for Evaluation and Treatment: Rehabilitation  THERAPY DIAG:  Muscle weakness (generalized)  Unsteadiness on feet  Difficulty in walking, not elsewhere classified  Chronic bilateral low back pain with bilateral sciatica  Other low back pain  Other lack of coordination  ONSET DATE: chronic  SUBJECTIVE:  OUTPATIENT PHYSICAL THERAPY THORACOLUMBAR TREATMENT   Patient Name: Olivia Shah MRN: [id number] DOB:10-30-1955, 67 y.o., female Today's Date: 06/20/2023  END OF SESSION:  PT End of Session - 06/20/23 1048     Visit Number 6    Date for PT Re-Evaluation 07/11/23    Progress Note Due on Visit 10    PT Start Time 1045    PT Stop Time 1130    PT Time Calculation (min) 45 min    Activity Tolerance Patient tolerated treatment well    Behavior During Therapy [facility] for tasks assessed/performed                 Past Medical History:  Diagnosis Date   Arthritis    Diabetes mellitus without complication (HCC)    Hypertension    Past Surgical History:  Procedure Laterality Date   APPLICATION OF CRANIAL NAVIGATION N/A 05/18/2021   Procedure: APPLICATION OF CRANIAL NAVIGATION;  Surgeon: Jadene Pierini, MD;  Location: [facility] OR;  Service: Neurosurgery;  Laterality: N/A;   BREAST BIOPSY Right    CHOLECYSTECTOMY     CRANIOTOMY Right 05/18/2021   Procedure: Right craniotomy for tumor resection;  Surgeon: Jadene Pierini, MD;  Location: [facility] OR;  Service: Neurosurgery;  Laterality: Right;   EYE SURGERY     as a child   HERNIA REPAIR     Patient Active Problem List   Diagnosis Date Noted   Abnormal findings on diagnostic imaging of other specified body structures 01/14/2022   Chronic pain 01/14/2022   Colon cancer screening 01/14/2022   Dysphagia 01/14/2022   Family history of malignant neoplasm of digestive organs 01/14/2022   Hyperglycemia due to type 2 diabetes mellitus (HCC) 01/14/2022   Hypertension 01/14/2022   Meralgia paresthetica 01/14/2022   Mixed hyperlipidemia 01/14/2022   Morbid obesity (HCC) 01/14/2022   Overweight 01/14/2022   Pure hypercholesterolemia 01/14/2022   Spinal cord disease (HCC) 01/14/2022   Chronic cough 12/01/2021   Diabetes (HCC) 07/08/2021   Glaucoma 07/08/2021   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   Visual field defect of left eye 07/08/2021    Debility 06/16/2021   Hypokalemia    Seizures (HCC)    Benign essential HTN    Controlled type 2 diabetes mellitus with hyperglycemia, without long-term current use of insulin (HCC)    Stridor 06/05/2021   Status post tracheostomy (HCC)    Acute respiratory failure with hypoxia (HCC)    Status epilepticus (HCC)    Brain tumor (HCC) 05/18/2021    PCP: Renford Dills, MD  REFERRING PROVIDER: Donalee Citrin, MD  REFERRING DIAG: LS spinal stenosis with neurogenic claudication  Rationale for Evaluation and Treatment: Rehabilitation  THERAPY DIAG:  Muscle weakness (generalized)  Unsteadiness on feet  Difficulty in walking, not elsewhere classified  Chronic bilateral low back pain with bilateral sciatica  Other low back pain  Other lack of coordination  ONSET DATE: chronic  SUBJECTIVE:  squatting, stairs, transfers, bathing, and locomotion level  PARTICIPATION LIMITATIONS: cleaning and laundry  PERSONAL FACTORS: Age, Behavior pattern, Fitness, Past/current experiences, Time since onset of injury/illness/exacerbation, Transportation, and 3+ comorbidities: h/o brain tumor, visual loss , DM  are also affecting patient's functional outcome.   REHAB POTENTIAL: Good  CLINICAL DECISION MAKING: Evolving/moderate complexity  EVALUATION COMPLEXITY: Moderate   GOALS: Goals reviewed with patient? Yes  SHORT TERM GOALS: Target  date: 2 weeks   I HEP Baseline:initiated at eval Goal status: 06/16/23 MET   LONG TERM GOALS: Target date: 07/11/2023  30 sec sit to stand 11 Baseline: 8 Goal status: INITIAL  2.  TUG improve to 14 or less Baseline: 20.15 Goal status: INITIAL  3.  2 min walk test, 250' or greater Baseline: walked 90 ' in one min and had to stop limited by pain Goal status: INITIAL  4.  Modified oswestry 36/50 Baseline: 26/50 Goal status: INITIAL   PLAN:  PT FREQUENCY: 2x/week  PT DURATION: 8 weeks  PLANNED INTERVENTIONS: Therapeutic exercises, Therapeutic activity, Neuromuscular re-education, Balance training, Gait training, Patient/Family education, Self Care, and Joint mobilization.  PLAN FOR NEXT SESSION: initiate cardiovascular equipment in gym, progress hip, LE strength, stability in supine, standing, sitting.   Early Chars, PT, DPT, OCS 06/20/2023, 11:32 AM [facility] at [facility] [address] [facility] [place], [place], [address] Phone: [phone]   Fax:  [phone]  Patient Details  Name: Olivia Shah MRN: [id number] Date of Birth: 01/02/1956 Referring Provider:  Donalee Citrin, MD  Encounter Date: 06/20/2023   Early Chars, PT 06/20/2023, 11:32 AM  [place] at [facility] [address] [place], [place], [address] Phone: [phone]   Fax:  [phone]

## 2023-06-20 NOTE — Progress Notes (Signed)
   Chief Complaint  Patient presents with   Nail Problem    Bilateral hallux nails are thick and discolored. Not painful. Has never been treated for nail fungus. Diabetic.   Nail trim.     SUBJECTIVE Patient with a history of diabetes mellitus presents to office today complaining of elongated, thickened nails that cause pain while ambulating in shoes.  Patient is unable to trim their own nails. Patient is here for further evaluation and treatment.  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 Derm Skin is dry and supple bilateral. Negative open lesions or macerations. Remaining integument unremarkable. Nails are tender, long, thickened and dystrophic with subungual debris, consistent with onychomycosis, 1-5 bilateral. No signs of infection noted. Vasc  DP and PT pedal pulses palpable bilaterally. Temperature gradient within normal limits.  Neuro Epicritic and protective threshold sensation diminished bilaterally.  Musculoskeletal Exam No symptomatic pedal deformities noted bilateral. Muscular strength within normal limits.  ASSESSMENT 1. Diabetes Mellitus w/ peripheral neuropathy 2.  Pain due to onychomycosis of toenails bilateral  PLAN OF CARE 1. Patient evaluated today.  Comprehensive diabetic foot exam performed today 2. Instructed to maintain good pedal hygiene and foot care. Stressed importance of controlling blood sugar.  3. Mechanical debridement of nails 1-5 bilaterally performed using a nail nipper. Filed with dremel without  incident.  4. Return to clinic in 3 mos.     Felecia Shelling, DPM [facility]  Dr. Felecia Shelling, DPM    [address] [place], [place] [address]                Office [phone]  Fax [phone]

## 2023-06-23 ENCOUNTER — Ambulatory Visit: Payer: Medicare Other | Admitting: Physical Therapy

## 2023-06-23 DIAGNOSIS — R2681 Unsteadiness on feet: Secondary | ICD-10-CM

## 2023-06-23 DIAGNOSIS — M6281 Muscle weakness (generalized): Secondary | ICD-10-CM | POA: Diagnosis not present

## 2023-06-23 DIAGNOSIS — R262 Difficulty in walking, not elsewhere classified: Secondary | ICD-10-CM

## 2023-06-23 NOTE — Therapy (Signed)
OUTPATIENT PHYSICAL THERAPY THORACOLUMBAR TREATMENT   Patient Name: Olivia Shah MRN: [id number] DOB:11-Dec-1955, 67 y.o., female Today's Date: 06/23/2023  END OF SESSION:  PT End of Session - 06/23/23 1707     Visit Number 7    Date for PT Re-Evaluation 07/11/23    PT Start Time 1705    PT Stop Time 1750    PT Time Calculation (min) 45 min                 Past Medical History:  Diagnosis Date   Arthritis    Diabetes mellitus without complication (HCC)    Hypertension    Past Surgical History:  Procedure Laterality Date   APPLICATION OF CRANIAL NAVIGATION N/A 05/18/2021   Procedure: APPLICATION OF CRANIAL NAVIGATION;  Surgeon: Jadene Pierini, MD;  Location: [facility] OR;  Service: Neurosurgery;  Laterality: N/A;   BREAST BIOPSY Right    CHOLECYSTECTOMY     CRANIOTOMY Right 05/18/2021   Procedure: Right craniotomy for tumor resection;  Surgeon: Jadene Pierini, MD;  Location: [facility] OR;  Service: Neurosurgery;  Laterality: Right;   EYE SURGERY     as a child   HERNIA REPAIR     Patient Active Problem List   Diagnosis Date Noted   Abnormal findings on diagnostic imaging of other specified body structures 01/14/2022   Chronic pain 01/14/2022   Colon cancer screening 01/14/2022   Dysphagia 01/14/2022   Family history of malignant neoplasm of digestive organs 01/14/2022   Hyperglycemia due to type 2 diabetes mellitus (HCC) 01/14/2022   Hypertension 01/14/2022   Meralgia paresthetica 01/14/2022   Mixed hyperlipidemia 01/14/2022   Morbid obesity (HCC) 01/14/2022   Overweight 01/14/2022   Pure hypercholesterolemia 01/14/2022   Spinal cord disease (HCC) 01/14/2022   Chronic cough 12/01/2021   Diabetes (HCC) 07/08/2021   Glaucoma 07/08/2021   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   Visual field defect of left eye 07/08/2021   Debility 06/16/2021   Hypokalemia    Seizures (HCC)    Benign essential HTN    Controlled type 2 diabetes mellitus with hyperglycemia, without  long-term current use of insulin (HCC)    Stridor 06/05/2021   Status post tracheostomy (HCC)    Acute respiratory failure with hypoxia (HCC)    Status epilepticus (HCC)    Brain tumor (HCC) 05/18/2021    PCP: Renford Dills, MD  REFERRING PROVIDER: Donalee Citrin, MD  REFERRING DIAG: LS spinal stenosis with neurogenic claudication  Rationale for Evaluation and Treatment: Rehabilitation  THERAPY DIAG:  Muscle weakness (generalized)  Unsteadiness on feet  Difficulty in walking, not elsewhere classified  ONSET DATE: chronic  SUBJECTIVE:  x weekly - 3 sets - 10 reps - Supine Bridge  - 1 x daily - 3 x weekly - 3 sets - 10 reps  ASSESSMENT:  CLINICAL IMPRESSION: progressed ex for strength and balance with goals assessed and documented below   OBJECTIVE IMPAIRMENTS: decreased activity tolerance, decreased balance, decreased endurance, difficulty walking, decreased strength, impaired perceived functional ability, postural dysfunction, and pain.   ACTIVITY LIMITATIONS: carrying, lifting, bending, standing, squatting, stairs, transfers, bathing, and locomotion level  PARTICIPATION LIMITATIONS: cleaning and laundry  PERSONAL FACTORS: Age, Behavior pattern, Fitness, Past/current experiences, Time since onset of injury/illness/exacerbation, Transportation, and 3+ comorbidities: h/o brain tumor, visual loss , DM  are also affecting patient's functional outcome.   REHAB POTENTIAL: Good  CLINICAL DECISION MAKING: Evolving/moderate complexity  EVALUATION COMPLEXITY: Moderate   GOALS: Goals reviewed with patient? Yes  SHORT TERM GOALS: Target date: 2 weeks    I HEP Baseline:initiated at eval Goal status: 06/16/23 MET   LONG TERM GOALS: Target date: 07/11/2023  30 sec sit to stand 11 Baseline: 8 Goal status: 06/23/23  progressing 10 x from mat without UE  2.  TUG improve to 14 or less Baseline: 20.15 Goal status:  06/23/23 10.33 sec MET no AD  3.  2 min walk test, 250' or greater Baseline: walked 90 ' in one min and had to stop limited by pain Goal status: 06/23/23 progressing walked 200 feet in 90 sec with SPC- SOB then walked without AD 100 feet 43 sec then stopped d/t weakness and increased lateral sway  4.  Modified oswestry 36/50 Baseline: 26/50 Goal status: INITIAL   PLAN:  PT FREQUENCY: 2x/week  PT DURATION: 8 weeks  PLANNED INTERVENTIONS: Therapeutic exercises, Therapeutic activity, Neuromuscular re-education, Balance training, Gait training, Patient/Family education, Self Care, and Joint mobilization.  PLAN FOR NEXT SESSION: initiate cardiovascular equipment in gym, progress hip, LE strength, stability in supine, standing, sitting. Practice gait without AD   Dionisio Aragones,ANGIE, PTA,  06/23/2023, 5:08 PM [facility] [address] [facility] [place], [place], [address] Phone: [phone]   Fax:  [phone]  Patient Details  Name: Olivia Shah MRN: [id number] Date of Birth: 11-17-1955 Referring Provider:  Donalee Citrin, MD  OUTPATIENT PHYSICAL THERAPY THORACOLUMBAR TREATMENT   Patient Name: Olivia Shah MRN: [id number] DOB:11-Dec-1955, 67 y.o., female Today's Date: 06/23/2023  END OF SESSION:  PT End of Session - 06/23/23 1707     Visit Number 7    Date for PT Re-Evaluation 07/11/23    PT Start Time 1705    PT Stop Time 1750    PT Time Calculation (min) 45 min                 Past Medical History:  Diagnosis Date   Arthritis    Diabetes mellitus without complication (HCC)    Hypertension    Past Surgical History:  Procedure Laterality Date   APPLICATION OF CRANIAL NAVIGATION N/A 05/18/2021   Procedure: APPLICATION OF CRANIAL NAVIGATION;  Surgeon: Jadene Pierini, MD;  Location: [facility] OR;  Service: Neurosurgery;  Laterality: N/A;   BREAST BIOPSY Right    CHOLECYSTECTOMY     CRANIOTOMY Right 05/18/2021   Procedure: Right craniotomy for tumor resection;  Surgeon: Jadene Pierini, MD;  Location: [facility] OR;  Service: Neurosurgery;  Laterality: Right;   EYE SURGERY     as a child   HERNIA REPAIR     Patient Active Problem List   Diagnosis Date Noted   Abnormal findings on diagnostic imaging of other specified body structures 01/14/2022   Chronic pain 01/14/2022   Colon cancer screening 01/14/2022   Dysphagia 01/14/2022   Family history of malignant neoplasm of digestive organs 01/14/2022   Hyperglycemia due to type 2 diabetes mellitus (HCC) 01/14/2022   Hypertension 01/14/2022   Meralgia paresthetica 01/14/2022   Mixed hyperlipidemia 01/14/2022   Morbid obesity (HCC) 01/14/2022   Overweight 01/14/2022   Pure hypercholesterolemia 01/14/2022   Spinal cord disease (HCC) 01/14/2022   Chronic cough 12/01/2021   Diabetes (HCC) 07/08/2021   Glaucoma 07/08/2021   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   Visual field defect of left eye 07/08/2021   Debility 06/16/2021   Hypokalemia    Seizures (HCC)    Benign essential HTN    Controlled type 2 diabetes mellitus with hyperglycemia, without  long-term current use of insulin (HCC)    Stridor 06/05/2021   Status post tracheostomy (HCC)    Acute respiratory failure with hypoxia (HCC)    Status epilepticus (HCC)    Brain tumor (HCC) 05/18/2021    PCP: Renford Dills, MD  REFERRING PROVIDER: Donalee Citrin, MD  REFERRING DIAG: LS spinal stenosis with neurogenic claudication  Rationale for Evaluation and Treatment: Rehabilitation  THERAPY DIAG:  Muscle weakness (generalized)  Unsteadiness on feet  Difficulty in walking, not elsewhere classified  ONSET DATE: chronic  SUBJECTIVE:  x weekly - 3 sets - 10 reps - Supine Bridge  - 1 x daily - 3 x weekly - 3 sets - 10 reps  ASSESSMENT:  CLINICAL IMPRESSION: progressed ex for strength and balance with goals assessed and documented below   OBJECTIVE IMPAIRMENTS: decreased activity tolerance, decreased balance, decreased endurance, difficulty walking, decreased strength, impaired perceived functional ability, postural dysfunction, and pain.   ACTIVITY LIMITATIONS: carrying, lifting, bending, standing, squatting, stairs, transfers, bathing, and locomotion level  PARTICIPATION LIMITATIONS: cleaning and laundry  PERSONAL FACTORS: Age, Behavior pattern, Fitness, Past/current experiences, Time since onset of injury/illness/exacerbation, Transportation, and 3+ comorbidities: h/o brain tumor, visual loss , DM  are also affecting patient's functional outcome.   REHAB POTENTIAL: Good  CLINICAL DECISION MAKING: Evolving/moderate complexity  EVALUATION COMPLEXITY: Moderate   GOALS: Goals reviewed with patient? Yes  SHORT TERM GOALS: Target date: 2 weeks    I HEP Baseline:initiated at eval Goal status: 06/16/23 MET   LONG TERM GOALS: Target date: 07/11/2023  30 sec sit to stand 11 Baseline: 8 Goal status: 06/23/23  progressing 10 x from mat without UE  2.  TUG improve to 14 or less Baseline: 20.15 Goal status:  06/23/23 10.33 sec MET no AD  3.  2 min walk test, 250' or greater Baseline: walked 90 ' in one min and had to stop limited by pain Goal status: 06/23/23 progressing walked 200 feet in 90 sec with SPC- SOB then walked without AD 100 feet 43 sec then stopped d/t weakness and increased lateral sway  4.  Modified oswestry 36/50 Baseline: 26/50 Goal status: INITIAL   PLAN:  PT FREQUENCY: 2x/week  PT DURATION: 8 weeks  PLANNED INTERVENTIONS: Therapeutic exercises, Therapeutic activity, Neuromuscular re-education, Balance training, Gait training, Patient/Family education, Self Care, and Joint mobilization.  PLAN FOR NEXT SESSION: initiate cardiovascular equipment in gym, progress hip, LE strength, stability in supine, standing, sitting. Practice gait without AD   Dionisio Aragones,ANGIE, PTA,  06/23/2023, 5:08 PM [facility] at [facility] [address] [facility] [place], [place], [address] Phone: [phone]   Fax:  [phone]  Patient Details  Name: Olivia Shah MRN: [id number] Date of Birth: 11-17-1955 Referring Provider:  Donalee Citrin, MD

## 2023-06-24 NOTE — Therapy (Signed)
required  HOME EXERCISE PROGRAM: Access Code: [email or web address] URL: [email or web address] Date: 05/16/2023 Prepared by: Amy Speaks  Exercises - Supine Lower Trunk Rotation  - 1 x daily - 3 x weekly - 3 sets - 10 reps - Hooklying Single Knee to Chest  - 1 x daily - 3 x weekly - 3 sets - 10 reps - Supine Bridge  - 1 x daily - 3 x weekly - 3 sets - 10 reps  ASSESSMENT:  CLINICAL IMPRESSION: patient reports some wheezing and SOB today. Continue with progressive exercise for strength and balance. She is able to walk with a good pace today, only able to do 1 lap without cane before needing to rest due to pain in low back.    OBJECTIVE IMPAIRMENTS: decreased activity tolerance, decreased balance, decreased endurance, difficulty walking, decreased strength, impaired perceived functional ability, postural dysfunction, and pain.   ACTIVITY LIMITATIONS: carrying, lifting, bending, standing, squatting, stairs, transfers, bathing, and locomotion level  PARTICIPATION LIMITATIONS:  cleaning and laundry  PERSONAL FACTORS: Age, Behavior pattern, Fitness, Past/current experiences, Time since onset of injury/illness/exacerbation, Transportation, and 3+ comorbidities: h/o brain tumor, visual loss , DM  are also affecting patient's functional outcome.   REHAB POTENTIAL: Good  CLINICAL DECISION MAKING: Evolving/moderate complexity  EVALUATION COMPLEXITY: Moderate   GOALS: Goals reviewed with patient? Yes  SHORT TERM GOALS: Target date: 2 weeks   I HEP Baseline:initiated at eval Goal status: 06/16/23 MET   LONG TERM GOALS: Target date: 07/11/2023  30 sec sit to stand 11 Baseline: 8 Goal status: 06/23/23  progressing 10 x from mat without UE  2.  TUG improve to 14 or less Baseline: 20.15 Goal status:  06/23/23 10.33 sec MET no AD  3.  2 min walk test, 250' or greater Baseline: walked 90 ' in one min and had to stop limited by pain Goal status: 06/23/23 progressing walked 200 feet in 90 sec with SPC- SOB then walked without AD 100 feet 43 sec then stopped d/t weakness and increased lateral sway  4.  Modified oswestry 36/50 Baseline: 26/50 Goal status: INITIAL   PLAN:  PT FREQUENCY: 2x/week  PT DURATION: 8 weeks  PLANNED INTERVENTIONS: Therapeutic exercises, Therapeutic activity, Neuromuscular re-education, Balance training, Gait training, Patient/Family education, Self Care, and Joint mobilization.  PLAN FOR NEXT SESSION: initiate cardiovascular equipment in gym, progress hip, LE strength, stability in supine, standing, sitting. Practice gait without AD   Cassie Freer, PT,  06/27/2023, 11:44 AM [place] [facility] at [facility] [place], [place], [address] Phone: [phone]   Fax:  [phone]  Patient Details  Name: Olivia Shah MRN: [id number] Date of Birth: [birth year] Referring Provider:  Donalee Citrin, MD  OUTPATIENT PHYSICAL THERAPY THORACOLUMBAR TREATMENT   Patient Name: Olivia Shah MRN: [id number] DOB:[birth year], 67 y.o., female Today's Date: 06/27/2023  END OF SESSION:  PT End of Session - 06/27/23 1059     Visit Number 8    Date for PT Re-Evaluation 07/11/23    PT Start Time 1100    PT Stop Time 1145    PT Time Calculation (min) 45 min                  Past Medical History:  Diagnosis Date   Arthritis    Diabetes mellitus without complication (HCC)    Hypertension    Past Surgical History:  Procedure Laterality Date   APPLICATION OF CRANIAL NAVIGATION N/A 05/18/2021   Procedure: APPLICATION OF CRANIAL NAVIGATION;  Surgeon: Jadene Pierini, MD;  Location: [facility] OR;  Service: Neurosurgery;  Laterality: N/A;   BREAST BIOPSY Right    CHOLECYSTECTOMY     CRANIOTOMY Right 05/18/2021   Procedure: Right craniotomy for tumor resection;  Surgeon: Jadene Pierini, MD;  Location: [facility] OR;  Service: Neurosurgery;  Laterality: Right;   EYE SURGERY     as a child   HERNIA REPAIR     Patient Active Problem List   Diagnosis Date Noted   Abnormal findings on diagnostic imaging of other specified body structures 01/14/2022   Chronic pain 01/14/2022   Colon cancer screening 01/14/2022   Dysphagia 01/14/2022   Family history of malignant neoplasm of digestive organs 01/14/2022   Hyperglycemia due to type 2 diabetes mellitus (HCC) 01/14/2022   Hypertension 01/14/2022   Meralgia paresthetica 01/14/2022   Mixed hyperlipidemia 01/14/2022   Morbid obesity (HCC) 01/14/2022   Overweight 01/14/2022   Pure hypercholesterolemia 01/14/2022   Spinal cord disease (HCC) 01/14/2022   Chronic cough 12/01/2021   Diabetes (HCC) 07/08/2021   Glaucoma 07/08/2021   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   Visual field defect of left eye 07/08/2021   Debility 06/16/2021   Hypokalemia    Seizures (HCC)    Benign essential HTN    Controlled type 2 diabetes mellitus with hyperglycemia, without  long-term current use of insulin (HCC)    Stridor 06/05/2021   Status post tracheostomy (HCC)    Acute respiratory failure with hypoxia (HCC)    Status epilepticus (HCC)    Brain tumor (HCC) 05/18/2021    PCP: Renford Dills, MD  REFERRING PROVIDER: Donalee Citrin, MD  REFERRING DIAG: LS spinal stenosis with neurogenic claudication  Rationale for Evaluation and Treatment: Rehabilitation  THERAPY DIAG:  Muscle weakness (generalized)  Unsteadiness on feet  Difficulty in walking, not elsewhere classified  Chronic bilateral low back pain with bilateral sciatica  Other low back pain  Other lack of coordination  ONSET DATE: chronic  SUBJECTIVE:  OUTPATIENT PHYSICAL THERAPY THORACOLUMBAR TREATMENT   Patient Name: Olivia Shah MRN: [id number] DOB:[birth year], 67 y.o., female Today's Date: 06/27/2023  END OF SESSION:  PT End of Session - 06/27/23 1059     Visit Number 8    Date for PT Re-Evaluation 07/11/23    PT Start Time 1100    PT Stop Time 1145    PT Time Calculation (min) 45 min                  Past Medical History:  Diagnosis Date   Arthritis    Diabetes mellitus without complication (HCC)    Hypertension    Past Surgical History:  Procedure Laterality Date   APPLICATION OF CRANIAL NAVIGATION N/A 05/18/2021   Procedure: APPLICATION OF CRANIAL NAVIGATION;  Surgeon: Jadene Pierini, MD;  Location: [facility] OR;  Service: Neurosurgery;  Laterality: N/A;   BREAST BIOPSY Right    CHOLECYSTECTOMY     CRANIOTOMY Right 05/18/2021   Procedure: Right craniotomy for tumor resection;  Surgeon: Jadene Pierini, MD;  Location: [facility] OR;  Service: Neurosurgery;  Laterality: Right;   EYE SURGERY     as a child   HERNIA REPAIR     Patient Active Problem List   Diagnosis Date Noted   Abnormal findings on diagnostic imaging of other specified body structures 01/14/2022   Chronic pain 01/14/2022   Colon cancer screening 01/14/2022   Dysphagia 01/14/2022   Family history of malignant neoplasm of digestive organs 01/14/2022   Hyperglycemia due to type 2 diabetes mellitus (HCC) 01/14/2022   Hypertension 01/14/2022   Meralgia paresthetica 01/14/2022   Mixed hyperlipidemia 01/14/2022   Morbid obesity (HCC) 01/14/2022   Overweight 01/14/2022   Pure hypercholesterolemia 01/14/2022   Spinal cord disease (HCC) 01/14/2022   Chronic cough 12/01/2021   Diabetes (HCC) 07/08/2021   Glaucoma 07/08/2021   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   Visual field defect of left eye 07/08/2021   Debility 06/16/2021   Hypokalemia    Seizures (HCC)    Benign essential HTN    Controlled type 2 diabetes mellitus with hyperglycemia, without  long-term current use of insulin (HCC)    Stridor 06/05/2021   Status post tracheostomy (HCC)    Acute respiratory failure with hypoxia (HCC)    Status epilepticus (HCC)    Brain tumor (HCC) 05/18/2021    PCP: Renford Dills, MD  REFERRING PROVIDER: Donalee Citrin, MD  REFERRING DIAG: LS spinal stenosis with neurogenic claudication  Rationale for Evaluation and Treatment: Rehabilitation  THERAPY DIAG:  Muscle weakness (generalized)  Unsteadiness on feet  Difficulty in walking, not elsewhere classified  Chronic bilateral low back pain with bilateral sciatica  Other low back pain  Other lack of coordination  ONSET DATE: chronic  SUBJECTIVE:  required  HOME EXERCISE PROGRAM: Access Code: [email or web address] URL: [email or web address] Date: 05/16/2023 Prepared by: Amy Speaks  Exercises - Supine Lower Trunk Rotation  - 1 x daily - 3 x weekly - 3 sets - 10 reps - Hooklying Single Knee to Chest  - 1 x daily - 3 x weekly - 3 sets - 10 reps - Supine Bridge  - 1 x daily - 3 x weekly - 3 sets - 10 reps  ASSESSMENT:  CLINICAL IMPRESSION: patient reports some wheezing and SOB today. Continue with progressive exercise for strength and balance. She is able to walk with a good pace today, only able to do 1 lap without cane before needing to rest due to pain in low back.    OBJECTIVE IMPAIRMENTS: decreased activity tolerance, decreased balance, decreased endurance, difficulty walking, decreased strength, impaired perceived functional ability, postural dysfunction, and pain.   ACTIVITY LIMITATIONS: carrying, lifting, bending, standing, squatting, stairs, transfers, bathing, and locomotion level  PARTICIPATION LIMITATIONS:  cleaning and laundry  PERSONAL FACTORS: Age, Behavior pattern, Fitness, Past/current experiences, Time since onset of injury/illness/exacerbation, Transportation, and 3+ comorbidities: h/o brain tumor, visual loss , DM  are also affecting patient's functional outcome.   REHAB POTENTIAL: Good  CLINICAL DECISION MAKING: Evolving/moderate complexity  EVALUATION COMPLEXITY: Moderate   GOALS: Goals reviewed with patient? Yes  SHORT TERM GOALS: Target date: 2 weeks   I HEP Baseline:initiated at eval Goal status: 06/16/23 MET   LONG TERM GOALS: Target date: 07/11/2023  30 sec sit to stand 11 Baseline: 8 Goal status: 06/23/23  progressing 10 x from mat without UE  2.  TUG improve to 14 or less Baseline: 20.15 Goal status:  06/23/23 10.33 sec MET no AD  3.  2 min walk test, 250' or greater Baseline: walked 90 ' in one min and had to stop limited by pain Goal status: 06/23/23 progressing walked 200 feet in 90 sec with SPC- SOB then walked without AD 100 feet 43 sec then stopped d/t weakness and increased lateral sway  4.  Modified oswestry 36/50 Baseline: 26/50 Goal status: INITIAL   PLAN:  PT FREQUENCY: 2x/week  PT DURATION: 8 weeks  PLANNED INTERVENTIONS: Therapeutic exercises, Therapeutic activity, Neuromuscular re-education, Balance training, Gait training, Patient/Family education, Self Care, and Joint mobilization.  PLAN FOR NEXT SESSION: initiate cardiovascular equipment in gym, progress hip, LE strength, stability in supine, standing, sitting. Practice gait without AD   Cassie Freer, PT,  06/27/2023, 11:44 AM [place] [facility] [place], [place], [address] Phone: [phone]   Fax:  [phone]  Patient Details  Name: Olivia Shah MRN: [id number] Date of Birth: [birth year] Referring Provider:  Donalee Citrin, MD

## 2023-06-27 ENCOUNTER — Ambulatory Visit: Payer: Medicare Other

## 2023-06-27 DIAGNOSIS — R262 Difficulty in walking, not elsewhere classified: Secondary | ICD-10-CM

## 2023-06-27 DIAGNOSIS — M5459 Other low back pain: Secondary | ICD-10-CM

## 2023-06-27 DIAGNOSIS — G8929 Other chronic pain: Secondary | ICD-10-CM

## 2023-06-27 DIAGNOSIS — R2681 Unsteadiness on feet: Secondary | ICD-10-CM

## 2023-06-27 DIAGNOSIS — M6281 Muscle weakness (generalized): Secondary | ICD-10-CM

## 2023-06-27 DIAGNOSIS — R278 Other lack of coordination: Secondary | ICD-10-CM

## 2023-06-30 ENCOUNTER — Ambulatory Visit: Payer: Medicare Other | Admitting: Physical Therapy

## 2023-07-01 NOTE — Therapy (Signed)
OUTPATIENT PHYSICAL THERAPY THORACOLUMBAR TREATMENT   Patient Name: Olivia Shah MRN: [id number] DOB:[birth year], 67 y.o., female Today's Date: 07/04/2023  END OF SESSION:  PT End of Session - 07/04/23 1012     Visit Number 9    Date for PT Re-Evaluation 07/11/23    PT Start Time 1012    PT Stop Time 1055    PT Time Calculation (min) 43 min                   Past Medical History:  Diagnosis Date   Arthritis    Diabetes mellitus without complication (HCC)    Hypertension    Past Surgical History:  Procedure Laterality Date   APPLICATION OF CRANIAL NAVIGATION N/A 05/18/2021   Procedure: APPLICATION OF CRANIAL NAVIGATION;  Surgeon: Jadene Pierini, MD;  Location: [facility] OR;  Service: Neurosurgery;  Laterality: N/A;   BREAST BIOPSY Right    CHOLECYSTECTOMY     CRANIOTOMY Right 05/18/2021   Procedure: Right craniotomy for tumor resection;  Surgeon: Jadene Pierini, MD;  Location: [facility];  Service: Neurosurgery;  Laterality: Right;   EYE SURGERY     as a child   HERNIA REPAIR     Patient Active Problem List   Diagnosis Date Noted   Abnormal findings on diagnostic imaging of other specified body structures 01/14/2022   Chronic pain 01/14/2022   Colon cancer screening 01/14/2022   Dysphagia 01/14/2022   Family history of malignant neoplasm of digestive organs 01/14/2022   Hyperglycemia due to type 2 diabetes mellitus (HCC) 01/14/2022   Hypertension 01/14/2022   Meralgia paresthetica 01/14/2022   Mixed hyperlipidemia 01/14/2022   Morbid obesity (HCC) 01/14/2022   Overweight 01/14/2022   Pure hypercholesterolemia 01/14/2022   Spinal cord disease (HCC) 01/14/2022   Chronic cough 12/01/2021   Diabetes (HCC) 07/08/2021   Glaucoma 07/08/2021   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   Visual field defect of left eye 07/08/2021   Debility 06/16/2021   Hypokalemia    Seizures (HCC)    Benign essential HTN    Controlled type 2 diabetes mellitus with hyperglycemia, without  long-term current use of insulin (HCC)    Stridor 06/05/2021   Status post tracheostomy (HCC)    Acute respiratory failure with hypoxia (HCC)    Status epilepticus (HCC)    Brain tumor (HCC) 05/18/2021    PCP: Renford Dills, MD  REFERRING PROVIDER: Donalee Citrin, MD  REFERRING DIAG: LS spinal stenosis with neurogenic claudication  Rationale for Evaluation and Treatment: Rehabilitation  THERAPY DIAG:  Muscle weakness (generalized)  Unsteadiness on feet  Difficulty in walking, not elsewhere classified  Chronic bilateral low back pain with bilateral sciatica  ONSET DATE: chronic  SUBJECTIVE:  Ball squeezes 2x10   PATIENT EDUCATION:  Education details: POC, goals Person educated: Patient Education method: Explanation, Facilities manager, Actor cues, Verbal cues, and Handouts Education comprehension: verbalized understanding, returned demonstration, verbal cues required, and tactile cues required  HOME EXERCISE PROGRAM: Access Code: [email or web address] URL: [email or web address] Date: 05/16/2023 Prepared by: Amy Speaks  Exercises - Supine Lower Trunk Rotation  - 1 x daily - 3 x weekly - 3 sets - 10 reps - Hooklying Single Knee to Chest  - 1 x daily - 3 x weekly - 3 sets - 10 reps - Supine Bridge  - 1 x daily - 3 x weekly - 3 sets - 10 reps  ASSESSMENT:  CLINICAL IMPRESSION: patient reports some wheezing and SOB today. Continue with progressive exercise for strength and balance. Some pain with leg extension, reports popping in L knee. Hesitant with side steps on airex, but able to do without holding on after a few steps.    OBJECTIVE IMPAIRMENTS: decreased activity tolerance, decreased balance, decreased endurance, difficulty walking, decreased strength, impaired perceived functional ability, postural dysfunction, and pain.    ACTIVITY LIMITATIONS: carrying, lifting, bending, standing, squatting, stairs, transfers, bathing, and locomotion level  PARTICIPATION LIMITATIONS: cleaning and laundry  PERSONAL FACTORS: Age, Behavior pattern, Fitness, Past/current experiences, Time since onset of injury/illness/exacerbation, Transportation, and 3+ comorbidities: h/o brain tumor, visual loss , DM  are also affecting patient's functional outcome.   REHAB POTENTIAL: Good  CLINICAL DECISION MAKING: Evolving/moderate complexity  EVALUATION COMPLEXITY: Moderate   GOALS: Goals reviewed with patient? Yes  SHORT TERM GOALS: Target date: 2 weeks   I HEP Baseline:initiated at eval Goal status: 06/16/23 MET   LONG TERM GOALS: Target date: 07/11/2023  30 sec sit to stand 11 Baseline: 8 Goal status: 06/23/23  progressing 10 x from mat without UE  2.  TUG improve to 14 or less Baseline: 20.15 Goal status:  06/23/23 10.33 sec MET no AD  3.  2 min walk test, 250' or greater Baseline: walked 90 ' in one min and had to stop limited by pain Goal status: 06/23/23 progressing walked 200 feet in 90 sec with SPC- SOB then walked without AD 100 feet 43 sec then stopped d/t weakness and increased lateral sway  4.  Modified oswestry 36/50 Baseline: 26/50 Goal status: INITIAL   PLAN:  PT FREQUENCY: 2x/week  PT DURATION: 8 weeks  PLANNED INTERVENTIONS: Therapeutic exercises, Therapeutic activity, Neuromuscular re-education, Balance training, Gait training, Patient/Family education, Self Care, and Joint mobilization.  PLAN FOR NEXT SESSION: initiate cardiovascular equipment in gym, progress hip, LE strength, stability in supine, standing, sitting. Practice gait without AD   Cassie Freer, PT,  07/04/2023, 10:52 AM [facility] [place], [place], [address] Phone: [phone]   Fax:  [phone]  Patient Details  Name: Olivia Shah MRN:  [id number] Date of Birth: [birth year] Referring Provider:  Donalee Citrin, MD  Ball squeezes 2x10   PATIENT EDUCATION:  Education details: POC, goals Person educated: Patient Education method: Explanation, Facilities manager, Actor cues, Verbal cues, and Handouts Education comprehension: verbalized understanding, returned demonstration, verbal cues required, and tactile cues required  HOME EXERCISE PROGRAM: Access Code: [email or web address] URL: [email or web address] Date: 05/16/2023 Prepared by: Amy Speaks  Exercises - Supine Lower Trunk Rotation  - 1 x daily - 3 x weekly - 3 sets - 10 reps - Hooklying Single Knee to Chest  - 1 x daily - 3 x weekly - 3 sets - 10 reps - Supine Bridge  - 1 x daily - 3 x weekly - 3 sets - 10 reps  ASSESSMENT:  CLINICAL IMPRESSION: patient reports some wheezing and SOB today. Continue with progressive exercise for strength and balance. Some pain with leg extension, reports popping in L knee. Hesitant with side steps on airex, but able to do without holding on after a few steps.    OBJECTIVE IMPAIRMENTS: decreased activity tolerance, decreased balance, decreased endurance, difficulty walking, decreased strength, impaired perceived functional ability, postural dysfunction, and pain.    ACTIVITY LIMITATIONS: carrying, lifting, bending, standing, squatting, stairs, transfers, bathing, and locomotion level  PARTICIPATION LIMITATIONS: cleaning and laundry  PERSONAL FACTORS: Age, Behavior pattern, Fitness, Past/current experiences, Time since onset of injury/illness/exacerbation, Transportation, and 3+ comorbidities: h/o brain tumor, visual loss , DM  are also affecting patient's functional outcome.   REHAB POTENTIAL: Good  CLINICAL DECISION MAKING: Evolving/moderate complexity  EVALUATION COMPLEXITY: Moderate   GOALS: Goals reviewed with patient? Yes  SHORT TERM GOALS: Target date: 2 weeks   I HEP Baseline:initiated at eval Goal status: 06/16/23 MET   LONG TERM GOALS: Target date: 07/11/2023  30 sec sit to stand 11 Baseline: 8 Goal status: 06/23/23  progressing 10 x from mat without UE  2.  TUG improve to 14 or less Baseline: 20.15 Goal status:  06/23/23 10.33 sec MET no AD  3.  2 min walk test, 250' or greater Baseline: walked 90 ' in one min and had to stop limited by pain Goal status: 06/23/23 progressing walked 200 feet in 90 sec with SPC- SOB then walked without AD 100 feet 43 sec then stopped d/t weakness and increased lateral sway  4.  Modified oswestry 36/50 Baseline: 26/50 Goal status: INITIAL   PLAN:  PT FREQUENCY: 2x/week  PT DURATION: 8 weeks  PLANNED INTERVENTIONS: Therapeutic exercises, Therapeutic activity, Neuromuscular re-education, Balance training, Gait training, Patient/Family education, Self Care, and Joint mobilization.  PLAN FOR NEXT SESSION: initiate cardiovascular equipment in gym, progress hip, LE strength, stability in supine, standing, sitting. Practice gait without AD   Cassie Freer, PT,  07/04/2023, 10:52 AM [facility] at [facility] [place], [place], [address] Phone: [phone]   Fax:  [phone]  Patient Details  Name: Olivia Shah MRN:  [id number] Date of Birth: [birth year] Referring Provider:  Donalee Citrin, MD  Ball squeezes 2x10   PATIENT EDUCATION:  Education details: POC, goals Person educated: Patient Education method: Explanation, Facilities manager, Actor cues, Verbal cues, and Handouts Education comprehension: verbalized understanding, returned demonstration, verbal cues required, and tactile cues required  HOME EXERCISE PROGRAM: Access Code: [email or web address] URL: [email or web address] Date: 05/16/2023 Prepared by: Amy Speaks  Exercises - Supine Lower Trunk Rotation  - 1 x daily - 3 x weekly - 3 sets - 10 reps - Hooklying Single Knee to Chest  - 1 x daily - 3 x weekly - 3 sets - 10 reps - Supine Bridge  - 1 x daily - 3 x weekly - 3 sets - 10 reps  ASSESSMENT:  CLINICAL IMPRESSION: patient reports some wheezing and SOB today. Continue with progressive exercise for strength and balance. Some pain with leg extension, reports popping in L knee. Hesitant with side steps on airex, but able to do without holding on after a few steps.    OBJECTIVE IMPAIRMENTS: decreased activity tolerance, decreased balance, decreased endurance, difficulty walking, decreased strength, impaired perceived functional ability, postural dysfunction, and pain.    ACTIVITY LIMITATIONS: carrying, lifting, bending, standing, squatting, stairs, transfers, bathing, and locomotion level  PARTICIPATION LIMITATIONS: cleaning and laundry  PERSONAL FACTORS: Age, Behavior pattern, Fitness, Past/current experiences, Time since onset of injury/illness/exacerbation, Transportation, and 3+ comorbidities: h/o brain tumor, visual loss , DM  are also affecting patient's functional outcome.   REHAB POTENTIAL: Good  CLINICAL DECISION MAKING: Evolving/moderate complexity  EVALUATION COMPLEXITY: Moderate   GOALS: Goals reviewed with patient? Yes  SHORT TERM GOALS: Target date: 2 weeks   I HEP Baseline:initiated at eval Goal status: 06/16/23 MET   LONG TERM GOALS: Target date: 07/11/2023  30 sec sit to stand 11 Baseline: 8 Goal status: 06/23/23  progressing 10 x from mat without UE  2.  TUG improve to 14 or less Baseline: 20.15 Goal status:  06/23/23 10.33 sec MET no AD  3.  2 min walk test, 250' or greater Baseline: walked 90 ' in one min and had to stop limited by pain Goal status: 06/23/23 progressing walked 200 feet in 90 sec with SPC- SOB then walked without AD 100 feet 43 sec then stopped d/t weakness and increased lateral sway  4.  Modified oswestry 36/50 Baseline: 26/50 Goal status: INITIAL   PLAN:  PT FREQUENCY: 2x/week  PT DURATION: 8 weeks  PLANNED INTERVENTIONS: Therapeutic exercises, Therapeutic activity, Neuromuscular re-education, Balance training, Gait training, Patient/Family education, Self Care, and Joint mobilization.  PLAN FOR NEXT SESSION: initiate cardiovascular equipment in gym, progress hip, LE strength, stability in supine, standing, sitting. Practice gait without AD   Cassie Freer, PT,  07/04/2023, 10:52 AM [facility]. [place], [place], [address] Phone: [phone]   Fax:  [phone]  Patient Details  Name: Olivia Shah MRN:  [id number] Date of Birth: [birth year] Referring Provider:  Donalee Citrin, MD

## 2023-07-04 ENCOUNTER — Ambulatory Visit: Payer: Medicare Other | Attending: Neurosurgery

## 2023-07-04 DIAGNOSIS — M6281 Muscle weakness (generalized): Secondary | ICD-10-CM | POA: Insufficient documentation

## 2023-07-04 DIAGNOSIS — M5442 Lumbago with sciatica, left side: Secondary | ICD-10-CM | POA: Insufficient documentation

## 2023-07-04 DIAGNOSIS — G8929 Other chronic pain: Secondary | ICD-10-CM | POA: Insufficient documentation

## 2023-07-04 DIAGNOSIS — M5441 Lumbago with sciatica, right side: Secondary | ICD-10-CM | POA: Insufficient documentation

## 2023-07-04 DIAGNOSIS — R2681 Unsteadiness on feet: Secondary | ICD-10-CM | POA: Diagnosis present

## 2023-07-04 DIAGNOSIS — M5459 Other low back pain: Secondary | ICD-10-CM | POA: Insufficient documentation

## 2023-07-04 DIAGNOSIS — R262 Difficulty in walking, not elsewhere classified: Secondary | ICD-10-CM | POA: Insufficient documentation

## 2023-07-07 ENCOUNTER — Ambulatory Visit: Payer: Medicare Other | Admitting: Physical Therapy

## 2023-07-07 DIAGNOSIS — G8929 Other chronic pain: Secondary | ICD-10-CM

## 2023-07-07 DIAGNOSIS — R262 Difficulty in walking, not elsewhere classified: Secondary | ICD-10-CM

## 2023-07-07 DIAGNOSIS — R2681 Unsteadiness on feet: Secondary | ICD-10-CM

## 2023-07-07 DIAGNOSIS — M6281 Muscle weakness (generalized): Secondary | ICD-10-CM

## 2023-07-07 NOTE — Therapy (Signed)
OUTPATIENT PHYSICAL THERAPY THORACOLUMBAR TREATMENT Progress Note Reporting Period 05/16/23 to 07/07/23  See note below for Objective Data and Assessment of Progress/Goals.      Patient Name: Olivia Shah MRN: [id number] DOB:1956/01/29, 67 y.o., female Today's Date: 07/07/2023  END OF SESSION:  PT End of Session - 07/07/23 1704     Visit Number 10    Date for PT Re-Evaluation 07/11/23    PT Start Time 1705    PT Stop Time 1750    PT Time Calculation (min) 45 min                   Past Medical History:  Diagnosis Date   Arthritis    Diabetes mellitus without complication (HCC)    Hypertension    Past Surgical History:  Procedure Laterality Date   APPLICATION OF CRANIAL NAVIGATION N/A 05/18/2021   Procedure: APPLICATION OF CRANIAL NAVIGATION;  Surgeon: Jadene Pierini, MD;  Location: [facility] OR;  Service: Neurosurgery;  Laterality: N/A;   BREAST BIOPSY Right    CHOLECYSTECTOMY     CRANIOTOMY Right 05/18/2021   Procedure: Right craniotomy for tumor resection;  Surgeon: Jadene Pierini, MD;  Location: [facility] OR;  Service: Neurosurgery;  Laterality: Right;   EYE SURGERY     as a child   HERNIA REPAIR     Patient Active Problem List   Diagnosis Date Noted   Abnormal findings on diagnostic imaging of other specified body structures 01/14/2022   Chronic pain 01/14/2022   Colon cancer screening 01/14/2022   Dysphagia 01/14/2022   Family history of malignant neoplasm of digestive organs 01/14/2022   Hyperglycemia due to type 2 diabetes mellitus (HCC) 01/14/2022   Hypertension 01/14/2022   Meralgia paresthetica 01/14/2022   Mixed hyperlipidemia 01/14/2022   Morbid obesity (HCC) 01/14/2022   Overweight 01/14/2022   Pure hypercholesterolemia 01/14/2022   Spinal cord disease (HCC) 01/14/2022   Chronic cough 12/01/2021   Diabetes (HCC) 07/08/2021   Glaucoma 07/08/2021   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   Visual field defect of left eye 07/08/2021   Debility  06/16/2021   Hypokalemia    Seizures (HCC)    Benign essential HTN    Controlled type 2 diabetes mellitus with hyperglycemia, without long-term current use of insulin (HCC)    Stridor 06/05/2021   Status post tracheostomy (HCC)    Acute respiratory failure with hypoxia (HCC)    Status epilepticus (HCC)    Brain tumor (HCC) 05/18/2021    PCP: Renford Dills, MD  REFERRING PROVIDER: Donalee Citrin, MD  REFERRING DIAG: LS spinal stenosis with neurogenic claudication  Rationale for Evaluation and Treatment: Rehabilitation  THERAPY DIAG:  Muscle weakness (generalized)  Unsteadiness on feet  Difficulty in walking, not elsewhere classified  Chronic bilateral low back pain with bilateral sciatica  ONSET DATE: chronic  SUBJECTIVE:  at [facility] [address] [facility] [facility] [place], [place], [address] Phone: [phone]   Fax:  [phone]  Patient Details  Name: ELDONNA NEUENFELDT MRN: [id number] Date of Birth: 08-08-1956 Referring Provider:  Donalee Citrin, MD  at [facility] [address] [facility] [place], [place], [address] Phone: [phone]   Fax:  [phone]  Patient Details  Name: ELDONNA NEUENFELDT MRN: [id number] Date of Birth: 08-08-1956 Referring Provider:  Donalee Citrin, MD  at [facility] [address] [facility] [place], [place], [address] Phone: [phone]   Fax:  [phone]  Patient Details  Name: ELDONNA NEUENFELDT MRN: [id number] Date of Birth: 08-08-1956 Referring Provider:  Donalee Citrin, MD  at [facility] [address] [facility] [place], [place], [address] Phone: [phone]   Fax:  [phone]  Patient Details  Name: ELDONNA NEUENFELDT MRN: [id number] Date of Birth: 08-08-1956 Referring Provider:  Donalee Citrin, MD

## 2023-07-11 ENCOUNTER — Ambulatory Visit: Payer: Medicare Other

## 2023-07-11 DIAGNOSIS — G8929 Other chronic pain: Secondary | ICD-10-CM

## 2023-07-11 DIAGNOSIS — M5459 Other low back pain: Secondary | ICD-10-CM

## 2023-07-11 DIAGNOSIS — R2681 Unsteadiness on feet: Secondary | ICD-10-CM

## 2023-07-11 DIAGNOSIS — M6281 Muscle weakness (generalized): Secondary | ICD-10-CM | POA: Diagnosis not present

## 2023-07-11 DIAGNOSIS — R262 Difficulty in walking, not elsewhere classified: Secondary | ICD-10-CM

## 2023-07-11 NOTE — Therapy (Signed)
OUTPATIENT PHYSICAL THERAPY THORACOLUMBAR TREATMENT   Patient Name: Olivia Shah MRN: [id number] DOB:[birth year], 67 y.o., female Today's Date: 07/11/2023  END OF SESSION:  PT End of Session - 07/11/23 1006     Visit Number 11    Date for PT Re-Evaluation 07/11/23    PT Start Time 1005    PT Stop Time 1050    PT Time Calculation (min) 45 min                    Past Medical History:  Diagnosis Date   Arthritis    Diabetes mellitus without complication (HCC)    Hypertension    Past Surgical History:  Procedure Laterality Date   APPLICATION OF CRANIAL NAVIGATION N/A 05/18/2021   Procedure: APPLICATION OF CRANIAL NAVIGATION;  Surgeon: Jadene Pierini, MD;  Location: [facility] OR;  Service: Neurosurgery;  Laterality: N/A;   BREAST BIOPSY Right    CHOLECYSTECTOMY     CRANIOTOMY Right 05/18/2021   Procedure: Right craniotomy for tumor resection;  Surgeon: Jadene Pierini, MD;  Location: [facility] OR;  Service: Neurosurgery;  Laterality: Right;   EYE SURGERY     as a child   HERNIA REPAIR     Patient Active Problem List   Diagnosis Date Noted   Abnormal findings on diagnostic imaging of other specified body structures 01/14/2022   Chronic pain 01/14/2022   Colon cancer screening 01/14/2022   Dysphagia 01/14/2022   Family history of malignant neoplasm of digestive organs 01/14/2022   Hyperglycemia due to type 2 diabetes mellitus (HCC) 01/14/2022   Hypertension 01/14/2022   Meralgia paresthetica 01/14/2022   Mixed hyperlipidemia 01/14/2022   Morbid obesity (HCC) 01/14/2022   Overweight 01/14/2022   Pure hypercholesterolemia 01/14/2022   Spinal cord disease (HCC) 01/14/2022   Chronic cough 12/01/2021   Diabetes (HCC) 07/08/2021   Glaucoma 07/08/2021   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   Visual field defect of left eye 07/08/2021   Debility 06/16/2021   Hypokalemia    Seizures (HCC)    Benign essential HTN    Controlled type 2 diabetes mellitus with hyperglycemia,  without long-term current use of insulin (HCC)    Stridor 06/05/2021   Status post tracheostomy (HCC)    Acute respiratory failure with hypoxia (HCC)    Status epilepticus (HCC)    Brain tumor (HCC) 05/18/2021    PCP: Renford Dills, MD  REFERRING PROVIDER: Donalee Citrin, MD  REFERRING DIAG: LS spinal stenosis with neurogenic claudication  Rationale for Evaluation and Treatment: Rehabilitation  THERAPY DIAG:  Muscle weakness (generalized)  Unsteadiness on feet  Difficulty in walking, not elsewhere classified  Chronic bilateral low back pain with bilateral sciatica  Other low back pain  ONSET DATE: chronic  SUBJECTIVE:  OUTPATIENT PHYSICAL THERAPY THORACOLUMBAR TREATMENT   Patient Name: Olivia Shah MRN: [id number] DOB:[birth year], 67 y.o., female Today's Date: 07/11/2023  END OF SESSION:  PT End of Session - 07/11/23 1006     Visit Number 11    Date for PT Re-Evaluation 07/11/23    PT Start Time 1005    PT Stop Time 1050    PT Time Calculation (min) 45 min                    Past Medical History:  Diagnosis Date   Arthritis    Diabetes mellitus without complication (HCC)    Hypertension    Past Surgical History:  Procedure Laterality Date   APPLICATION OF CRANIAL NAVIGATION N/A 05/18/2021   Procedure: APPLICATION OF CRANIAL NAVIGATION;  Surgeon: Jadene Pierini, MD;  Location: [facility] OR;  Service: Neurosurgery;  Laterality: N/A;   BREAST BIOPSY Right    CHOLECYSTECTOMY     CRANIOTOMY Right 05/18/2021   Procedure: Right craniotomy for tumor resection;  Surgeon: Jadene Pierini, MD;  Location: [facility] OR;  Service: Neurosurgery;  Laterality: Right;   EYE SURGERY     as a child   HERNIA REPAIR     Patient Active Problem List   Diagnosis Date Noted   Abnormal findings on diagnostic imaging of other specified body structures 01/14/2022   Chronic pain 01/14/2022   Colon cancer screening 01/14/2022   Dysphagia 01/14/2022   Family history of malignant neoplasm of digestive organs 01/14/2022   Hyperglycemia due to type 2 diabetes mellitus (HCC) 01/14/2022   Hypertension 01/14/2022   Meralgia paresthetica 01/14/2022   Mixed hyperlipidemia 01/14/2022   Morbid obesity (HCC) 01/14/2022   Overweight 01/14/2022   Pure hypercholesterolemia 01/14/2022   Spinal cord disease (HCC) 01/14/2022   Chronic cough 12/01/2021   Diabetes (HCC) 07/08/2021   Glaucoma 07/08/2021   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   Visual field defect of left eye 07/08/2021   Debility 06/16/2021   Hypokalemia    Seizures (HCC)    Benign essential HTN    Controlled type 2 diabetes mellitus with hyperglycemia,  without long-term current use of insulin (HCC)    Stridor 06/05/2021   Status post tracheostomy (HCC)    Acute respiratory failure with hypoxia (HCC)    Status epilepticus (HCC)    Brain tumor (HCC) 05/18/2021    PCP: Renford Dills, MD  REFERRING PROVIDER: Donalee Citrin, MD  REFERRING DIAG: LS spinal stenosis with neurogenic claudication  Rationale for Evaluation and Treatment: Rehabilitation  THERAPY DIAG:  Muscle weakness (generalized)  Unsteadiness on feet  Difficulty in walking, not elsewhere classified  Chronic bilateral low back pain with bilateral sciatica  Other low back pain  ONSET DATE: chronic  SUBJECTIVE:  OUTPATIENT PHYSICAL THERAPY THORACOLUMBAR TREATMENT   Patient Name: Olivia Shah MRN: [id number] DOB:[birth year], 67 y.o., female Today's Date: 07/11/2023  END OF SESSION:  PT End of Session - 07/11/23 1006     Visit Number 11    Date for PT Re-Evaluation 07/11/23    PT Start Time 1005    PT Stop Time 1050    PT Time Calculation (min) 45 min                    Past Medical History:  Diagnosis Date   Arthritis    Diabetes mellitus without complication (HCC)    Hypertension    Past Surgical History:  Procedure Laterality Date   APPLICATION OF CRANIAL NAVIGATION N/A 05/18/2021   Procedure: APPLICATION OF CRANIAL NAVIGATION;  Surgeon: Jadene Pierini, MD;  Location: [facility] OR;  Service: Neurosurgery;  Laterality: N/A;   BREAST BIOPSY Right    CHOLECYSTECTOMY     CRANIOTOMY Right 05/18/2021   Procedure: Right craniotomy for tumor resection;  Surgeon: Jadene Pierini, MD;  Location: [facility] OR;  Service: Neurosurgery;  Laterality: Right;   EYE SURGERY     as a child   HERNIA REPAIR     Patient Active Problem List   Diagnosis Date Noted   Abnormal findings on diagnostic imaging of other specified body structures 01/14/2022   Chronic pain 01/14/2022   Colon cancer screening 01/14/2022   Dysphagia 01/14/2022   Family history of malignant neoplasm of digestive organs 01/14/2022   Hyperglycemia due to type 2 diabetes mellitus (HCC) 01/14/2022   Hypertension 01/14/2022   Meralgia paresthetica 01/14/2022   Mixed hyperlipidemia 01/14/2022   Morbid obesity (HCC) 01/14/2022   Overweight 01/14/2022   Pure hypercholesterolemia 01/14/2022   Spinal cord disease (HCC) 01/14/2022   Chronic cough 12/01/2021   Diabetes (HCC) 07/08/2021   Glaucoma 07/08/2021   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   Visual field defect of left eye 07/08/2021   Debility 06/16/2021   Hypokalemia    Seizures (HCC)    Benign essential HTN    Controlled type 2 diabetes mellitus with hyperglycemia,  without long-term current use of insulin (HCC)    Stridor 06/05/2021   Status post tracheostomy (HCC)    Acute respiratory failure with hypoxia (HCC)    Status epilepticus (HCC)    Brain tumor (HCC) 05/18/2021    PCP: Renford Dills, MD  REFERRING PROVIDER: Donalee Citrin, MD  REFERRING DIAG: LS spinal stenosis with neurogenic claudication  Rationale for Evaluation and Treatment: Rehabilitation  THERAPY DIAG:  Muscle weakness (generalized)  Unsteadiness on feet  Difficulty in walking, not elsewhere classified  Chronic bilateral low back pain with bilateral sciatica  Other low back pain  ONSET DATE: chronic  SUBJECTIVE:  in gym, progress hip, LE strength, stability in supine, standing, sitting. Practice gait without AD   Cassie Freer, PT,  07/11/2023, 10:49 AM [facility] Outpatient Rehabilitation at [facility]. [place], [place], [address] Phone: [phone]   Fax:  [phone]  Patient Details  Name: CARAMIA BOUTIN MRN: [id number] Date of Birth: 1956/09/18 Referring Provider:  Donalee Citrin, MD [facility] Outpatient Rehabilitation at [facility] [place], [place], [address] Phone: [phone]   Fax:  [phone]  Patient Details  Name: MADY OUBRE MRN: [id number] Date of Birth: [birth year] Referring Provider:  Donalee Citrin, MD  in gym, progress hip, LE strength, stability in supine, standing, sitting. Practice gait without AD   Cassie Freer, PT,  07/11/2023, 10:49 AM [facility] Outpatient Rehabilitation at [facility] [address] [facility]. [place], [place], [address] Phone: [phone]   Fax:  [phone]  Patient Details  Name: CARAMIA BOUTIN MRN: [id number] Date of Birth: 1956/09/18 Referring Provider:  Donalee Citrin, MD [facility] Outpatient Rehabilitation at [facility]. [place], [place], [address] Phone: [phone]   Fax:  [phone]  Patient Details  Name: MADY OUBRE MRN: [id number] Date of Birth: [birth year] Referring Provider:  Donalee Citrin, MD

## 2023-07-16 ENCOUNTER — Other Ambulatory Visit: Payer: Self-pay | Admitting: Neurology

## 2023-07-18 ENCOUNTER — Ambulatory Visit: Payer: Medicare Other

## 2023-07-18 NOTE — Telephone Encounter (Signed)
Requested Prescriptions   Pending Prescriptions Disp Refills   Lacosamide 100 MG TABS [Pharmacy Med Name: Lacosamide 100 MG Oral Tablet] 60 tablet 0    Sig: Take 1 tablet by mouth twice daily   Last seen 01/17/23 Next appt 01/17/24 Dispenses   Dispensed Days Supply Quantity Provider Pharmacy  LACOSAMIDE 100MG     TAB 06/18/2023 30 60 each Windell Norfolk, MD [facility]  LACOSAMIDE 100MG     TAB 05/18/2023 30 60 each Windell Norfolk, MD [facility]  LACOSAMIDE 100MG     TAB 04/18/2023 30 60 each Windell Norfolk, MD [facility]  LACOSAMIDE 100MG     TAB 03/20/2023 30 60 each Windell Norfolk, MD [facility]  LACOSAMIDE 100MG     TAB 02/16/2023 30 60 each Windell Norfolk, MD [facility]  LACOSAMIDE 100MG     TAB 01/18/2023 30 60 each Windell Norfolk, MD [facility]  LACOSAMIDE 200MG     TAB 12/31/2022 30 60 each Windell Norfolk, MD [facility]  LACOSAMIDE 200MG     TAB 12/03/2022 30 60 each Windell Norfolk, MD [facility]  LACOSAMIDE 200MG     TAB 10/31/2022 30 60 each Windell Norfolk, MD [facility]  LACOSAMIDE 200MG     TAB 10/02/2022 30 60 each Windell Norfolk, MD [facility]  LACOSAMIDE 200MG     TAB 09/02/2022 30 60 each Windell Norfolk, MD [facility]  LACOSAMIDE 200MG     TAB 08/03/2022 30 60 each Windell Norfolk, MD [facility]Marland KitchenMarland Kitchen

## 2023-07-21 ENCOUNTER — Ambulatory Visit: Payer: Medicare Other | Admitting: Physical Therapy

## 2023-07-21 DIAGNOSIS — M6281 Muscle weakness (generalized): Secondary | ICD-10-CM

## 2023-07-21 DIAGNOSIS — R262 Difficulty in walking, not elsewhere classified: Secondary | ICD-10-CM

## 2023-07-21 DIAGNOSIS — R2681 Unsteadiness on feet: Secondary | ICD-10-CM

## 2023-07-21 NOTE — Therapy (Signed)
by pain Goal status: 06/23/23 progressing walked 200 feet in 90 sec with SPC- SOB then walked without AD 100 feet 43 sec then stopped d/t weakness and increased lateral sway  10/10 /24 amb 200 feet without AD slight limp on RT 1 min 28 sec progressing 07/21/23 2 big laps in gym 200 feet no AD 1 min 30 sec- decreased control with rt step,lands hard on RT esp as she fatigues. SPC 1 min 10 sec with smoother stride and better control of RT LE  4.  Modified oswestry 36/50 Baseline: 26/50 Goal status: INITIAL  5. Patient will improve BERG score to 45/56 or better   Baseline: 39/56  Goal status: INITIAL    PLAN:  PT FREQUENCY: 2x/week  PT DURATION: 8 weeks  PLANNED INTERVENTIONS: Therapeutic exercises, Therapeutic activity, Neuromuscular re-education, Balance training, Gait training, Patient/Family education, Self Care, and Joint mobilization.  PLAN FOR NEXT SESSION: initiate cardiovascular equipment in gym, progress hip, LE strength, stability in supine, standing, sitting. Practice gait without AD   Cassie Freer, PT,  07/21/2023, 5:54 PM [place] [facility] Outpatient Rehabilitation at [facility]. [place], [place], [address] Phone: [phone]   Fax:  [phone]  Patient Details  Name: Olivia Shah MRN: [id number] Date of Birth: 1956/03/20 Referring Provider:  Donalee Citrin, MD [facility] Outpatient Rehabilitation at [facility] [place], [place], [address] Phone: [phone]   Fax:  [phone]  by pain Goal status: 06/23/23 progressing walked 200 feet in 90 sec with SPC- SOB then walked without AD 100 feet 43 sec then stopped d/t weakness and increased lateral sway  10/10 /24 amb 200 feet without AD slight limp on RT 1 min 28 sec progressing 07/21/23 2 big laps in gym 200 feet no AD 1 min 30 sec- decreased control with rt step,lands hard on RT esp as she fatigues. SPC 1 min 10 sec with smoother stride and better control of RT LE  4.  Modified oswestry 36/50 Baseline: 26/50 Goal status: INITIAL  5. Patient will improve BERG score to 45/56 or better   Baseline: 39/56  Goal status: INITIAL    PLAN:  PT FREQUENCY: 2x/week  PT DURATION: 8 weeks  PLANNED INTERVENTIONS: Therapeutic exercises, Therapeutic activity, Neuromuscular re-education, Balance training, Gait training, Patient/Family education, Self Care, and Joint mobilization.  PLAN FOR NEXT SESSION: initiate cardiovascular equipment in gym, progress hip, LE strength, stability in supine, standing, sitting. Practice gait without AD   Cassie Freer, PT,  07/21/2023, 5:54 PM [place] [facility] Outpatient Rehabilitation at [facility]. [place], [place], [address] Phone: [phone]   Fax:  [phone]  Patient Details  Name: Olivia Shah MRN: [id number] Date of Birth: 1956/03/20 Referring Provider:  Donalee Citrin, MD [facility] Outpatient Rehabilitation at [facility] [place], [place], [address] Phone: [phone]   Fax:  [phone]  by pain Goal status: 06/23/23 progressing walked 200 feet in 90 sec with SPC- SOB then walked without AD 100 feet 43 sec then stopped d/t weakness and increased lateral sway  10/10 /24 amb 200 feet without AD slight limp on RT 1 min 28 sec progressing 07/21/23 2 big laps in gym 200 feet no AD 1 min 30 sec- decreased control with rt step,lands hard on RT esp as she fatigues. SPC 1 min 10 sec with smoother stride and better control of RT LE  4.  Modified oswestry 36/50 Baseline: 26/50 Goal status: INITIAL  5. Patient will improve BERG score to 45/56 or better   Baseline: 39/56  Goal status: INITIAL    PLAN:  PT FREQUENCY: 2x/week  PT DURATION: 8 weeks  PLANNED INTERVENTIONS: Therapeutic exercises, Therapeutic activity, Neuromuscular re-education, Balance training, Gait training, Patient/Family education, Self Care, and Joint mobilization.  PLAN FOR NEXT SESSION: initiate cardiovascular equipment in gym, progress hip, LE strength, stability in supine, standing, sitting. Practice gait without AD   Cassie Freer, PT,  07/21/2023, 5:54 PM [place] [facility] Outpatient Rehabilitation at [facility]. [place], [place], [address] Phone: [phone]   Fax:  [phone]  Patient Details  Name: Olivia Shah MRN: [id number] Date of Birth: 1956/03/20 Referring Provider:  Donalee Citrin, MD [facility] Outpatient Rehabilitation at [facility] [place], [place], [address] Phone: [phone]   Fax:  [phone]  OUTPATIENT PHYSICAL THERAPY THORACOLUMBAR TREATMENT   Patient Name: Olivia Shah MRN: [id number] DOB:1956/04/05, 67 y.o., female Today's Date: 07/21/2023  END OF SESSION:  PT End of Session - 07/21/23 1659     Visit Number 12    Date for PT Re-Evaluation 08/26/23    PT Start Time 1455    PT Stop Time 1545    PT Time Calculation (min) 50 min                    Past Medical History:  Diagnosis Date   Arthritis    Diabetes mellitus without complication (HCC)    Hypertension    Past Surgical History:  Procedure Laterality Date   APPLICATION OF CRANIAL NAVIGATION N/A 05/18/2021   Procedure: APPLICATION OF CRANIAL NAVIGATION;  Surgeon: Jadene Pierini, MD;  Location: [facility] OR;  Service: Neurosurgery;  Laterality: N/A;   BREAST BIOPSY Right    CHOLECYSTECTOMY     CRANIOTOMY Right 05/18/2021   Procedure: Right craniotomy for tumor resection;  Surgeon: Jadene Pierini, MD;  Location: [facility] OR;  Service: Neurosurgery;  Laterality: Right;   EYE SURGERY     as a child   HERNIA REPAIR     Patient Active Problem List   Diagnosis Date Noted   Abnormal findings on diagnostic imaging of other specified body structures 01/14/2022   Chronic pain 01/14/2022   Colon cancer screening 01/14/2022   Dysphagia 01/14/2022   Family history of malignant neoplasm of digestive organs 01/14/2022   Hyperglycemia due to type 2 diabetes mellitus (HCC) 01/14/2022   Hypertension 01/14/2022   Meralgia paresthetica 01/14/2022   Mixed hyperlipidemia 01/14/2022   Morbid obesity (HCC) 01/14/2022   Overweight 01/14/2022   Pure hypercholesterolemia 01/14/2022   Spinal cord disease (HCC) 01/14/2022   Chronic cough 12/01/2021   Diabetes (HCC) 07/08/2021   Glaucoma 07/08/2021   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   Visual field defect of left eye 07/08/2021   Debility 06/16/2021   Hypokalemia    Seizures (HCC)    Benign essential HTN    Controlled type 2 diabetes mellitus with hyperglycemia,  without long-term current use of insulin (HCC)    Stridor 06/05/2021   Status post tracheostomy (HCC)    Acute respiratory failure with hypoxia (HCC)    Status epilepticus (HCC)    Brain tumor (HCC) 05/18/2021    PCP: Renford Dills, MD  REFERRING PROVIDER: Donalee Citrin, MD  REFERRING DIAG: LS spinal stenosis with neurogenic claudication  Rationale for Evaluation and Treatment: Rehabilitation  THERAPY DIAG:  Muscle weakness (generalized)  Unsteadiness on feet  Difficulty in walking, not elsewhere classified  ONSET DATE: chronic  SUBJECTIVE:  by pain Goal status: 06/23/23 progressing walked 200 feet in 90 sec with SPC- SOB then walked without AD 100 feet 43 sec then stopped d/t weakness and increased lateral sway  10/10 /24 amb 200 feet without AD slight limp on RT 1 min 28 sec progressing 07/21/23 2 big laps in gym 200 feet no AD 1 min 30 sec- decreased control with rt step,lands hard on RT esp as she fatigues. SPC 1 min 10 sec with smoother stride and better control of RT LE  4.  Modified oswestry 36/50 Baseline: 26/50 Goal status: INITIAL  5. Patient will improve BERG score to 45/56 or better   Baseline: 39/56  Goal status: INITIAL    PLAN:  PT FREQUENCY: 2x/week  PT DURATION: 8 weeks  PLANNED INTERVENTIONS: Therapeutic exercises, Therapeutic activity, Neuromuscular re-education, Balance training, Gait training, Patient/Family education, Self Care, and Joint mobilization.  PLAN FOR NEXT SESSION: initiate cardiovascular equipment in gym, progress hip, LE strength, stability in supine, standing, sitting. Practice gait without AD   Cassie Freer, PT,  07/21/2023, 5:54 PM [place] [facility] Outpatient Rehabilitation at [facility]. [place], [place], [address] Phone: [phone]   Fax:  [phone]  Patient Details  Name: Olivia Shah MRN: [id number] Date of Birth: 1956/03/20 Referring Provider:  Donalee Citrin, MD [facility] Outpatient Rehabilitation at [facility] [place], [place], [address] Phone: [phone]   Fax:  [phone]

## 2023-07-22 NOTE — Therapy (Signed)
walked 90 ' in one min and had to stop limited by pain Goal status: 06/23/23 progressing walked 200 feet in 90 sec with SPC- SOB then walked without AD 100 feet 43 sec then stopped d/t weakness and increased lateral sway  10/10 /24 amb 200 feet without AD slight limp on RT 1 min 28 sec progressing 07/21/23 2 big laps in gym 200 feet no AD 1 min 30 sec- decreased control with rt step,lands hard on RT esp as she fatigues. SPC 1 min 10 sec with smoother stride and better control of RT LE  4.  Modified oswestry 36/50 Baseline: 26/50 Goal status: INITIAL  5. Patient will improve BERG score to 45/56 or better   Baseline: 39/56  Goal status: INITIAL    PLAN:  PT FREQUENCY: 2x/week  PT DURATION: 8 weeks  PLANNED INTERVENTIONS: Therapeutic exercises, Therapeutic activity, Neuromuscular re-education, Balance training, Gait training, Patient/Family education, Self Care, and Joint mobilization.  PLAN FOR NEXT SESSION: initiate cardiovascular equipment in gym, progress hip, LE strength, stability in supine, standing, sitting. Practice gait without AD   Cassie Freer, PT,  07/25/2023, 11:40 AM [facility] Outpatient Rehabilitation at [facility]. [place], [place], [address] Phone: [phone]   Fax:  [phone]  Patient Details  Name: Olivia Shah MRN: [id number] Date of Birth: 03/21/1956 Referring Provider:  Donalee Citrin, MD [facility] Outpatient Rehabilitation at [facility]. [place], [place], [address] Phone: [phone]   Fax:  [phone]  OUTPATIENT PHYSICAL THERAPY THORACOLUMBAR TREATMENT   Patient Name: Olivia Shah MRN: [id number] DOB:[birth year], 67 y.o., female Today's Date: 07/25/2023  END OF SESSION:  PT End of Session - 07/25/23 1053     Visit Number 13    Date for PT Re-Evaluation 08/26/23    PT Start Time 1055    PT Stop Time 1140    PT Time Calculation (min) 45 min                     Past Medical History:  Diagnosis Date   Arthritis    Diabetes mellitus without complication (HCC)    Hypertension    Past Surgical History:  Procedure Laterality Date   APPLICATION OF CRANIAL NAVIGATION N/A 05/18/2021   Procedure: APPLICATION OF CRANIAL NAVIGATION;  Surgeon: Jadene Pierini, MD;  Location: [facility] OR;  Service: Neurosurgery;  Laterality: N/A;   BREAST BIOPSY Right    CHOLECYSTECTOMY     CRANIOTOMY Right 05/18/2021   Procedure: Right craniotomy for tumor resection;  Surgeon: Jadene Pierini, MD;  Location: [facility] OR;  Service: Neurosurgery;  Laterality: Right;   EYE SURGERY     as a child   HERNIA REPAIR     Patient Active Problem List   Diagnosis Date Noted   Abnormal findings on diagnostic imaging of other specified body structures 01/14/2022   Chronic pain 01/14/2022   Colon cancer screening 01/14/2022   Dysphagia 01/14/2022   Family history of malignant neoplasm of digestive organs 01/14/2022   Hyperglycemia due to type 2 diabetes mellitus (HCC) 01/14/2022   Hypertension 01/14/2022   Meralgia paresthetica 01/14/2022   Mixed hyperlipidemia 01/14/2022   Morbid obesity (HCC) 01/14/2022   Overweight 01/14/2022   Pure hypercholesterolemia 01/14/2022   Spinal cord disease (HCC) 01/14/2022   Chronic cough 12/01/2021   Diabetes (HCC) 07/08/2021   Glaucoma 07/08/2021   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   Visual field defect of left eye 07/08/2021   Debility 06/16/2021   Hypokalemia    Seizures (HCC)    Benign essential HTN    Controlled type 2 diabetes mellitus with hyperglycemia,  without long-term current use of insulin (HCC)    Stridor 06/05/2021   Status post tracheostomy (HCC)    Acute respiratory failure with hypoxia (HCC)    Status epilepticus (HCC)    Brain tumor (HCC) 05/18/2021    PCP: Renford Dills, MD  REFERRING PROVIDER: Donalee Citrin, MD  REFERRING DIAG: LS spinal stenosis with neurogenic claudication  Rationale for Evaluation and Treatment: Rehabilitation  THERAPY DIAG:  Muscle weakness (generalized)  Unsteadiness on feet  Difficulty in walking, not elsewhere classified  Chronic bilateral low back pain with bilateral sciatica  Other low back pain  ONSET DATE: chronic  SUBJECTIVE:  walked 90 ' in one min and had to stop limited by pain Goal status: 06/23/23 progressing walked 200 feet in 90 sec with SPC- SOB then walked without AD 100 feet 43 sec then stopped d/t weakness and increased lateral sway  10/10 /24 amb 200 feet without AD slight limp on RT 1 min 28 sec progressing 07/21/23 2 big laps in gym 200 feet no AD 1 min 30 sec- decreased control with rt step,lands hard on RT esp as she fatigues. SPC 1 min 10 sec with smoother stride and better control of RT LE  4.  Modified oswestry 36/50 Baseline: 26/50 Goal status: INITIAL  5. Patient will improve BERG score to 45/56 or better   Baseline: 39/56  Goal status: INITIAL    PLAN:  PT FREQUENCY: 2x/week  PT DURATION: 8 weeks  PLANNED INTERVENTIONS: Therapeutic exercises, Therapeutic activity, Neuromuscular re-education, Balance training, Gait training, Patient/Family education, Self Care, and Joint mobilization.  PLAN FOR NEXT SESSION: initiate cardiovascular equipment in gym, progress hip, LE strength, stability in supine, standing, sitting. Practice gait without AD   Cassie Freer, PT,  07/25/2023, 11:40 AM [facility] Outpatient Rehabilitation at [facility] [place], [place], [address] Phone: [phone]   Fax:  [phone]  Patient Details  Name: Olivia Shah MRN: [id number] Date of Birth: 03/21/1956 Referring Provider:  Donalee Citrin, MD [facility] Outpatient Rehabilitation at [facility]. [place], [place], [address] Phone: [phone]   Fax:  [phone]  walked 90 ' in one min and had to stop limited by pain Goal status: 06/23/23 progressing walked 200 feet in 90 sec with SPC- SOB then walked without AD 100 feet 43 sec then stopped d/t weakness and increased lateral sway  10/10 /24 amb 200 feet without AD slight limp on RT 1 min 28 sec progressing 07/21/23 2 big laps in gym 200 feet no AD 1 min 30 sec- decreased control with rt step,lands hard on RT esp as she fatigues. SPC 1 min 10 sec with smoother stride and better control of RT LE  4.  Modified oswestry 36/50 Baseline: 26/50 Goal status: INITIAL  5. Patient will improve BERG score to 45/56 or better   Baseline: 39/56  Goal status: INITIAL    PLAN:  PT FREQUENCY: 2x/week  PT DURATION: 8 weeks  PLANNED INTERVENTIONS: Therapeutic exercises, Therapeutic activity, Neuromuscular re-education, Balance training, Gait training, Patient/Family education, Self Care, and Joint mobilization.  PLAN FOR NEXT SESSION: initiate cardiovascular equipment in gym, progress hip, LE strength, stability in supine, standing, sitting. Practice gait without AD   Cassie Freer, PT,  07/25/2023, 11:40 AM [facility] Outpatient Rehabilitation at [facility] [place], [place], [address] Phone: [phone]   Fax:  [phone]  Patient Details  Name: Olivia Shah MRN: [id number] Date of Birth: 03/21/1956 Referring Provider:  Donalee Citrin, MD [facility] Outpatient Rehabilitation at [facility]. [place], [place], [address] Phone: [phone]   Fax:  [phone]  OUTPATIENT PHYSICAL THERAPY THORACOLUMBAR TREATMENT   Patient Name: Olivia Shah MRN: [id number] DOB:[birth year], 67 y.o., female Today's Date: 07/25/2023  END OF SESSION:  PT End of Session - 07/25/23 1053     Visit Number 13    Date for PT Re-Evaluation 08/26/23    PT Start Time 1055    PT Stop Time 1140    PT Time Calculation (min) 45 min                     Past Medical History:  Diagnosis Date   Arthritis    Diabetes mellitus without complication (HCC)    Hypertension    Past Surgical History:  Procedure Laterality Date   APPLICATION OF CRANIAL NAVIGATION N/A 05/18/2021   Procedure: APPLICATION OF CRANIAL NAVIGATION;  Surgeon: Jadene Pierini, MD;  Location: [facility] OR;  Service: Neurosurgery;  Laterality: N/A;   BREAST BIOPSY Right    CHOLECYSTECTOMY     CRANIOTOMY Right 05/18/2021   Procedure: Right craniotomy for tumor resection;  Surgeon: Jadene Pierini, MD;  Location: [facility] OR;  Service: Neurosurgery;  Laterality: Right;   EYE SURGERY     as a child   HERNIA REPAIR     Patient Active Problem List   Diagnosis Date Noted   Abnormal findings on diagnostic imaging of other specified body structures 01/14/2022   Chronic pain 01/14/2022   Colon cancer screening 01/14/2022   Dysphagia 01/14/2022   Family history of malignant neoplasm of digestive organs 01/14/2022   Hyperglycemia due to type 2 diabetes mellitus (HCC) 01/14/2022   Hypertension 01/14/2022   Meralgia paresthetica 01/14/2022   Mixed hyperlipidemia 01/14/2022   Morbid obesity (HCC) 01/14/2022   Overweight 01/14/2022   Pure hypercholesterolemia 01/14/2022   Spinal cord disease (HCC) 01/14/2022   Chronic cough 12/01/2021   Diabetes (HCC) 07/08/2021   Glaucoma 07/08/2021   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   Visual field defect of left eye 07/08/2021   Debility 06/16/2021   Hypokalemia    Seizures (HCC)    Benign essential HTN    Controlled type 2 diabetes mellitus with hyperglycemia,  without long-term current use of insulin (HCC)    Stridor 06/05/2021   Status post tracheostomy (HCC)    Acute respiratory failure with hypoxia (HCC)    Status epilepticus (HCC)    Brain tumor (HCC) 05/18/2021    PCP: Renford Dills, MD  REFERRING PROVIDER: Donalee Citrin, MD  REFERRING DIAG: LS spinal stenosis with neurogenic claudication  Rationale for Evaluation and Treatment: Rehabilitation  THERAPY DIAG:  Muscle weakness (generalized)  Unsteadiness on feet  Difficulty in walking, not elsewhere classified  Chronic bilateral low back pain with bilateral sciatica  Other low back pain  ONSET DATE: chronic  SUBJECTIVE:

## 2023-07-25 ENCOUNTER — Ambulatory Visit: Payer: Medicare Other

## 2023-07-25 DIAGNOSIS — M6281 Muscle weakness (generalized): Secondary | ICD-10-CM | POA: Diagnosis not present

## 2023-07-25 DIAGNOSIS — R2681 Unsteadiness on feet: Secondary | ICD-10-CM

## 2023-07-25 DIAGNOSIS — R262 Difficulty in walking, not elsewhere classified: Secondary | ICD-10-CM

## 2023-07-25 DIAGNOSIS — M5459 Other low back pain: Secondary | ICD-10-CM

## 2023-07-25 DIAGNOSIS — G8929 Other chronic pain: Secondary | ICD-10-CM

## 2023-08-02 NOTE — Therapy (Signed)
OUTPATIENT PHYSICAL THERAPY THORACOLUMBAR TREATMENT   Patient Name: Olivia Shah MRN: [id number] DOB:[birth year], 67 y.o., female Today's Date: 08/04/2023  END OF SESSION:  PT End of Session - 08/04/23 1712     Visit Number 14    Date for PT Re-Evaluation 08/26/23    PT Start Time 1710    PT Stop Time 1755    PT Time Calculation (min) 45 min                      Past Medical History:  Diagnosis Date   Arthritis    Diabetes mellitus without complication (HCC)    Hypertension    Past Surgical History:  Procedure Laterality Date   APPLICATION OF CRANIAL NAVIGATION N/A 05/18/2021   Procedure: APPLICATION OF CRANIAL NAVIGATION;  Surgeon: Jadene Pierini, MD;  Location: [facility] OR;  Service: Neurosurgery;  Laterality: N/A;   BREAST BIOPSY Right    CHOLECYSTECTOMY     CRANIOTOMY Right 05/18/2021   Procedure: Right craniotomy for tumor resection;  Surgeon: Jadene Pierini, MD;  Location: [facility] OR;  Service: Neurosurgery;  Laterality: Right;   EYE SURGERY     as a child   HERNIA REPAIR     Patient Active Problem List   Diagnosis Date Noted   Abnormal findings on diagnostic imaging of other specified body structures 01/14/2022   Chronic pain 01/14/2022   Colon cancer screening 01/14/2022   Dysphagia 01/14/2022   Family history of malignant neoplasm of digestive organs 01/14/2022   Hyperglycemia due to type 2 diabetes mellitus (HCC) 01/14/2022   Hypertension 01/14/2022   Meralgia paresthetica 01/14/2022   Mixed hyperlipidemia 01/14/2022   Morbid obesity (HCC) 01/14/2022   Overweight 01/14/2022   Pure hypercholesterolemia 01/14/2022   Spinal cord disease (HCC) 01/14/2022   Chronic cough 12/01/2021   Diabetes (HCC) 07/08/2021   Glaucoma 07/08/2021   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   Visual field defect of left eye 07/08/2021   Debility 06/16/2021   Hypokalemia    Seizures (HCC)    Benign essential HTN    Controlled type 2 diabetes mellitus with hyperglycemia,  without long-term current use of insulin (HCC)    Stridor 06/05/2021   Status post tracheostomy (HCC)    Acute respiratory failure with hypoxia (HCC)    Status epilepticus (HCC)    Brain tumor (HCC) 05/18/2021    PCP: Renford Dills, MD  REFERRING PROVIDER: Donalee Citrin, MD  REFERRING DIAG: LS spinal stenosis with neurogenic claudication  Rationale for Evaluation and Treatment: Rehabilitation  THERAPY DIAG:  Muscle weakness (generalized)  Unsteadiness on feet  Difficulty in walking, not elsewhere classified  Chronic bilateral low back pain with bilateral sciatica  Other low back pain  Other lack of coordination  ONSET DATE: chronic  SUBJECTIVE:  SUBJECTIVE STATEMENT: The last time I was here I did not hurt for two days. I don't know what it was but it worked.   PERTINENT HISTORY:  The patient has history of chronic lower back pain, participated in PT over 4 months ago for similar problem. Reports increased Sx and progression now of pain to ant thighs , was B post thighs and LB.  Referred to PT by neurologist  PAIN:  Are you having pain 2-3/10 PRECAUTIONS: Fall  RED FLAGS: None   WEIGHT BEARING RESTRICTIONS: No  FALLS:  Has patient fallen in last 6 months? No  LIVING ENVIRONMENT: Lives with: lives alone Lives in: House/apartment Stairs:  ramp Has following equipment at home: Single point cane, Environmental consultant - 2 wheeled, Environmental consultant - 4 wheeled, and Ramped entry  OCCUPATION: works in a church 4 days a week  PLOF: Independent with household mobility with device, Needs assistance with ADLs, and Needs assistance with homemaking  PATIENT GOALS: reduce pain for better standing tolerance  NEXT MD VISIT: unclear  OBJECTIVE:   DIAGNOSTIC FINDINGS:  na  PATIENT SURVEYS:  Modified Oswestry  26/50   SCREENING FOR RED FLAGS: Bowel or bladder incontinence: No Spinal tumors: No Cauda equina syndrome: No Compression fracture: No Abdominal aneurysm: No  COGNITION: Overall cognitive status: Within functional limits for tasks assessed     SENSATION: Light touch intact B LE's today  MUSCLE LENGTH: Hamstrings: Right wfl deg; Left wfl deg Maisie Fus test: Right wfl deg; Left wfl deg  POSTURE:  narrowed stance B, trendelenburg lurch B, with increased sway with gait, increased lumbar lordosis  PALPATION: Non tender with palpation lumbar paraspinals and B post hip musculature  LUMBAR ROM:   AROM eval  Flexion 80%  Extension 50%  Right lateral flexion 50  Left lateral flexion 50  Right rotation   Left rotation    (Blank rows = not tested)  LOWER EXTREMITY ROM:   all LE AAROM WNL B LOWER EXTREMITY MMT:    MMT Right eval Left eval  Hip flexion 3 3-  Hip extension 3+ 3+  Hip abduction 3 3-  Hip adduction    Hip internal rotation    Hip external rotation    Knee flexion    Knee extension 4 4  Ankle dorsiflexion    Ankle plantarflexion 4- 4-  Ankle inversion    Ankle eversion     (Blank rows = not tested)  LUMBAR SPECIAL TESTS:  Straight leg raise test: Negative and FABER test: Negative  FUNCTIONAL TESTS  30 seconds chair stand test 8 reps Timed up and go (TUG): 20.15 2 minute walk test: 90', 1 min 10 sec, stopped due to B thigh pain  GAIT: Distance walked: 64' Assistive device utilized: Single point cane Level of assistance: Modified independence Comments: fatigued quickly, increased trunk sway and decreased hip control with fatigue  TODAY'S TREATMENT:  DATE: 08/04/23 NuStep L5x18mins  5# seated hip flexion, lateral tap on 4" step  5# standing marches w/SPC  Leg ext 10# 2x10 HS curls 25# 2x10 STS on airex 2x10    07/25/23 NuStep L5 x76mins Resisted gait 20# 4 way x4 CGA  Side steps over obstacles CGA Modified sit ups holding yellow ball 2x10  STS holding yellow ball 2x10 Calf raises 2x12 Toe raises 2x12   07/21/23 Assessed and doc goals for renewal Nustep L 5 20# resisted gait 5 x fwd,5 x backward, 3 x each way BERG 39/56   Standing HHA 5# ankle wts stepping over foam roll 2 sets 5 mod A struggled with LLE. Then lateral step 2 set 5 min A Standing HHA 5# ankle wts    07/11/23 NuStep L5 x18mins  Walking on beam Side steps on airex standing by steps  Step ups 6" with hand rail  STS on airex with red ball chest press 2x10 Calf raises 2x10  Calf stretch 15s x2 Toe raises 2x10 Walking without AD 2 big laps    07/07/23 Goal assessment Nustep L 6 6 min  Obstacle course with SPC stepping on and over objects CGA On foam mat stepping on/off laterally- min A 6 inch alt tap 20 x 2 sets 6 inch side step 15 x each side HS curl 25# 2 sets 10 Knee ext 10# 2 sets 12   07/04/23 NuStep L5x67mins  STS on airex 2x10 Box taps 6" w/CGA  Leg curls 20# 2x10 Leg ext 10# 2x5 Side steps on beam  Walking 2 small laps with SPC, then 2 w/o   06/27/23 NuStep L5 x18mins  STS with ball 2x10 Side steps along mat  Leg ext 5# x10, 10# 2x5 HS curls 20# 2x10 Box taps 6" w/SPC and then w/o- CGA  Walking laps 2 laps with SPC, 1 lap w/o  Physioball flexion stretch x10, x5 rotations    06/23/23 Nustep L 5 walked 200 feet in 90 sec with SPC- SOB then walked without AD 100 feet 43 sec then stopped d/t weakness and increased lateral sway - pt really wants to amb without AD HS curl 20# 2 sets 10 then SL 10# 10 x each Knee ext 8# 2 sets 10 ( 10# was too heavy) then SL 5# 5 x each ( LLE very crepitus with decreased ROM) Resisted Gait 5 x fwd, 5 x backward, 3 x each side 20# CGA with cuing 4 inch alt step tap 20 x  then 6 inch 20x STS with wt ball 10 x    06/20/23: Nustep level 5 6 min 30  sec Sit to stand with yellow mediball press from hi/lo table 10x Supine with physioball under thighs for LTR 10x Supine physioball under heels, B knee to chest with post pelvic tilt( abdominal cxn) 15x Supine physioball under calves, alt SLR cues to lock knee into ext and stabilize ball with post thigh 10x Supine physioball under thighs for bridging 15x Hooklying for theraband resisted hip abd/ER blue band Side lying for hip abd 10 x each, manual assist by PT for R leg Side lying for clam shells, 10 x each  Seated long arc quads, 5#, 10 x 3 sets each  06/16/23 Nustep L 5 Bridge hold 3 sec 2 sets 10 Green tband hip flex and clams 2 sets 10 Iso abdominal press 2 sets 10 hold 3 sec STS with wt ball press 10 x Standing wt ball ext ( post radiating pain)and rotation  10 x each HHA red tband SL hip flex,ext and abd 10 x each- weakness with hip ABD Seated hip abd red tband 2 sets 10 Educ on arthritis and  need for strengthening     06/13/23: Nustep level 5, 6 min  Supine for physioball LTR B knee to chest legs on physioball Bridging thighs over physioball Alt SLR with thighs on physioball Side lying clam shell, light manual resistance by PT B knee extension, 5#, 2 sets 10 reps B knee flexion 20#, 2 sets 10 reps Side stepping along elevated mat table, 10 x with 2# cuff weights on each ankle Standing for alt hip ext , with knee extending, hands on elevated mat, 10 x each with 2# cuff wts. Seated hamstring curls with red t band, 3 x 10 each  06/06/23: Supine for physioball LTR B knee to chest legs on physioball Bridging thighs over physioball Sidelying clam shells, 15 reps each side Nustep, level 4, Ue's and LE's, 6 min Standing with 2# cuff wts on ankles, for side stepping length of mat, mat elevated for pt to use for UE support Standing for hip ext alt hips with 2 # cuff wt  Forward step ups on 4" step, 10 x each leg  06/02/23 NuStep L4 x16mins  Hip ext/ abd 2# x10 Marching 2#  20 reps alt  LAQ 2# 2x10 HS curls green 2x10 STS 2x10  Ball squeezes 2x10   PATIENT EDUCATION:  Education details: POC, goals Person educated: Patient Education method: Programmer, multimedia, Facilities manager, Actor cues, Verbal cues, and Handouts Education comprehension: verbalized understanding, returned demonstration, verbal cues required, and tactile cues required  HOME EXERCISE PROGRAM: Access Code: [email or web address] URL: [email or web address] Date: 05/16/2023 Prepared by: Amy Speaks  Exercises - Supine Lower Trunk Rotation  - 1 x daily - 3 x weekly - 3 sets - 10 reps - Hooklying Single Knee to Chest  - 1 x daily - 3 x weekly - 3 sets - 10 reps - Supine Bridge  - 1 x daily - 3 x weekly - 3 sets - 10 reps  ASSESSMENT:  CLINICAL IMPRESSION:  Pt reports no pain for two days after her last visit. Today she had just a little bit of pain in her thighs. Continued working on some functional strengthening to help improve general strength and ability to be more mobile. Was fearful with STS on airex initially, but after first few reps she is able to do it with good balance. Pt continues to show improves with skilled therapy and will continue to benefit from services.   OBJECTIVE IMPAIRMENTS: decreased activity tolerance, decreased balance, decreased endurance, difficulty walking, decreased strength, impaired perceived functional ability, postural dysfunction, and pain.   ACTIVITY LIMITATIONS: carrying, lifting, bending, standing, squatting, stairs, transfers, bathing, and locomotion level  PARTICIPATION LIMITATIONS: cleaning and laundry  PERSONAL FACTORS: Age, Behavior pattern, Fitness, Past/current experiences, Time since onset of injury/illness/exacerbation, Transportation, and 3+ comorbidities: h/o brain tumor, visual loss , DM  are also affecting patient's functional outcome.   REHAB POTENTIAL: Good  CLINICAL DECISION MAKING: Evolving/moderate complexity  EVALUATION COMPLEXITY:  Moderate   GOALS: Goals reviewed with patient? Yes  SHORT TERM GOALS: Target date: 2 weeks   I HEP Baseline:initiated at eval Goal status: 06/16/23 MET   LONG TERM GOALS: Target date: 07/11/2023  30 sec sit to stand 11 Baseline: 8 Goal status: 06/23/23  progressing 10 x from mat without UE 07/07/23 10 x mat without UE progressing 07/21/23 10x ,11 x in 33.4 sec  2.  TUG improve to 14 or less Baseline: 20.15 Goal status:  06/23/23 10.33 sec MET no AD  3.  2 min walk test, 250' or greater Baseline: walked 90 ' in one min and had to stop limited by pain Goal status: 06/23/23 progressing walked 200 feet in 90 sec with SPC- SOB then walked without AD 100 feet 43 sec then stopped d/t weakness and increased lateral sway  10/10 /24 amb 200 feet without AD slight limp on RT 1 min 28 sec progressing 07/21/23 2 big laps in gym 200 feet no AD 1 min 30 sec- decreased control with rt step,lands hard on RT esp as she fatigues. SPC 1 min 10 sec with smoother stride and better control of RT LE  4.  Modified oswestry 36/50 Baseline: 26/50 Goal status: INITIAL  5. Patient will improve BERG score to 45/56 or better   Baseline: 39/56  Goal status: INITIAL    PLAN:  PT FREQUENCY: 2x/week  PT DURATION: 8 weeks  PLANNED INTERVENTIONS: Therapeutic exercises, Therapeutic activity, Neuromuscular re-education, Balance training, Gait training, Patient/Family education, Self Care, and Joint mobilization.  PLAN FOR NEXT SESSION: initiate cardiovascular equipment in gym, progress hip, LE strength, stability in supine, standing, sitting. Practice gait without AD   Cassie Freer, PT,  08/04/2023, 5:55 PM [facility] Outpatient Rehabilitation at [facility] [place], [place], [address] Phone: [phone]   Fax:  [phone]  Patient Details  Name: Olivia Shah MRN: [id number] Date of Birth: 1955-11-07 Referring Provider:  Donalee Citrin, MD [facility]  Outpatient Rehabilitation at [facility] [place], [place], [address] Phone: [phone]   Fax:  [phone]

## 2023-08-04 ENCOUNTER — Ambulatory Visit: Payer: Medicare Other | Attending: Neurosurgery

## 2023-08-04 DIAGNOSIS — R2681 Unsteadiness on feet: Secondary | ICD-10-CM | POA: Diagnosis present

## 2023-08-04 DIAGNOSIS — M5442 Lumbago with sciatica, left side: Secondary | ICD-10-CM | POA: Insufficient documentation

## 2023-08-04 DIAGNOSIS — G8929 Other chronic pain: Secondary | ICD-10-CM | POA: Insufficient documentation

## 2023-08-04 DIAGNOSIS — M6281 Muscle weakness (generalized): Secondary | ICD-10-CM | POA: Insufficient documentation

## 2023-08-04 DIAGNOSIS — M5459 Other low back pain: Secondary | ICD-10-CM | POA: Diagnosis present

## 2023-08-04 DIAGNOSIS — R262 Difficulty in walking, not elsewhere classified: Secondary | ICD-10-CM | POA: Insufficient documentation

## 2023-08-04 DIAGNOSIS — R278 Other lack of coordination: Secondary | ICD-10-CM | POA: Insufficient documentation

## 2023-08-04 DIAGNOSIS — M5441 Lumbago with sciatica, right side: Secondary | ICD-10-CM | POA: Insufficient documentation

## 2023-08-15 ENCOUNTER — Encounter: Payer: Self-pay | Admitting: Physical Therapy

## 2023-08-15 ENCOUNTER — Ambulatory Visit: Payer: Medicare Other | Admitting: Physical Therapy

## 2023-08-15 DIAGNOSIS — M5459 Other low back pain: Secondary | ICD-10-CM

## 2023-08-15 DIAGNOSIS — R2681 Unsteadiness on feet: Secondary | ICD-10-CM

## 2023-08-15 DIAGNOSIS — R262 Difficulty in walking, not elsewhere classified: Secondary | ICD-10-CM

## 2023-08-15 DIAGNOSIS — M6281 Muscle weakness (generalized): Secondary | ICD-10-CM

## 2023-08-15 DIAGNOSIS — G8929 Other chronic pain: Secondary | ICD-10-CM

## 2023-08-15 NOTE — Therapy (Signed)
OUTPATIENT PHYSICAL THERAPY THORACOLUMBAR TREATMENT   Patient Name: Olivia Shah MRN: [id number] DOB:1956-07-08, 67 y.o., female Today's Date: 08/15/2023  END OF SESSION:  PT End of Session - 08/15/23 1057     Visit Number 15    Date for PT Re-Evaluation 08/26/23    PT Start Time 1058    PT Stop Time 1145    PT Time Calculation (min) 47 min    Activity Tolerance Patient tolerated treatment well    Behavior During Therapy WFL for tasks assessed/performed                      Past Medical History:  Diagnosis Date   Arthritis    Diabetes mellitus without complication (HCC)    Hypertension    Past Surgical History:  Procedure Laterality Date   APPLICATION OF CRANIAL NAVIGATION N/A 05/18/2021   Procedure: APPLICATION OF CRANIAL NAVIGATION;  Surgeon: Jadene Pierini, MD;  Location: [facility] OR;  Service: Neurosurgery;  Laterality: N/A;   BREAST BIOPSY Right    CHOLECYSTECTOMY     CRANIOTOMY Right 05/18/2021   Procedure: Right craniotomy for tumor resection;  Surgeon: Jadene Pierini, MD;  Location: [facility] OR;  Service: Neurosurgery;  Laterality: Right;   EYE SURGERY     as a child   HERNIA REPAIR     Patient Active Problem List   Diagnosis Date Noted   Abnormal findings on diagnostic imaging of other specified body structures 01/14/2022   Chronic pain 01/14/2022   Colon cancer screening 01/14/2022   Dysphagia 01/14/2022   Family history of malignant neoplasm of digestive organs 01/14/2022   Hyperglycemia due to type 2 diabetes mellitus (HCC) 01/14/2022   Hypertension 01/14/2022   Meralgia paresthetica 01/14/2022   Mixed hyperlipidemia 01/14/2022   Morbid obesity (HCC) 01/14/2022   Overweight 01/14/2022   Pure hypercholesterolemia 01/14/2022   Spinal cord disease (HCC) 01/14/2022   Chronic cough 12/01/2021   Diabetes (HCC) 07/08/2021   Glaucoma 07/08/2021   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   Visual field defect of left eye 07/08/2021   Debility 06/16/2021    Hypokalemia    Seizures (HCC)    Benign essential HTN    Controlled type 2 diabetes mellitus with hyperglycemia, without long-term current use of insulin (HCC)    Stridor 06/05/2021   Status post tracheostomy (HCC)    Acute respiratory failure with hypoxia (HCC)    Status epilepticus (HCC)    Brain tumor (HCC) 05/18/2021    PCP: Renford Dills, MD  REFERRING PROVIDER: Donalee Citrin, MD  REFERRING DIAG: LS spinal stenosis with neurogenic claudication  Rationale for Evaluation and Treatment: Rehabilitation  THERAPY DIAG:  Muscle weakness (generalized)  Difficulty in walking, not elsewhere classified  Chronic bilateral low back pain with bilateral sciatica  Unsteadiness on feet  Other low back pain  ONSET DATE: chronic  SUBJECTIVE:  SUBJECTIVE STATEMENT: "So, So" Pain in the upper legs  PERTINENT HISTORY:  The patient has history of chronic lower back pain, participated in PT over 4 months ago for similar problem. Reports increased Sx and progression now of pain to ant thighs , was B post thighs and LB.  Referred to PT by neurologist  PAIN:  Are you having pain 4/10 PRECAUTIONS: Fall  RED FLAGS: None   WEIGHT BEARING RESTRICTIONS: No  FALLS:  Has patient fallen in last 6 months? No  LIVING ENVIRONMENT: Lives with: lives alone Lives in: House/apartment Stairs:  ramp Has following equipment at home: Single point cane, Environmental consultant - 2 wheeled, Environmental consultant - 4 wheeled, and Ramped entry  OCCUPATION: works in a church 4 days a week  PLOF: Independent with household mobility with device, Needs assistance with ADLs, and Needs assistance with homemaking  PATIENT GOALS: reduce pain for better standing tolerance  NEXT MD VISIT: unclear  OBJECTIVE:   DIAGNOSTIC FINDINGS:  na  PATIENT SURVEYS:   Modified Oswestry 26/50   SCREENING FOR RED FLAGS: Bowel or bladder incontinence: No Spinal tumors: No Cauda equina syndrome: No Compression fracture: No Abdominal aneurysm: No  COGNITION: Overall cognitive status: Within functional limits for tasks assessed     SENSATION: Light touch intact B LE's today  MUSCLE LENGTH: Hamstrings: Right wfl deg; Left wfl deg Maisie Fus test: Right wfl deg; Left wfl deg  POSTURE:  narrowed stance B, trendelenburg lurch B, with increased sway with gait, increased lumbar lordosis  PALPATION: Non tender with palpation lumbar paraspinals and B post hip musculature  LUMBAR ROM:   AROM eval  Flexion 80%  Extension 50%  Right lateral flexion 50  Left lateral flexion 50  Right rotation   Left rotation    (Blank rows = not tested)  LOWER EXTREMITY ROM:   all LE AAROM WNL B LOWER EXTREMITY MMT:    MMT Right eval Left eval  Hip flexion 3 3-  Hip extension 3+ 3+  Hip abduction 3 3-  Hip adduction    Hip internal rotation    Hip external rotation    Knee flexion    Knee extension 4 4  Ankle dorsiflexion    Ankle plantarflexion 4- 4-  Ankle inversion    Ankle eversion     (Blank rows = not tested)  LUMBAR SPECIAL TESTS:  Straight leg raise test: Negative and FABER test: Negative  FUNCTIONAL TESTS  30 seconds chair stand test 8 reps Timed up and go (TUG): 20.15 2 minute walk test: 90', 1 min 10 sec, stopped due to B thigh pain  GAIT: Distance walked: 82' Assistive device utilized: Single point cane Level of assistance: Modified independence Comments: fatigued quickly, increased trunk sway and decreased hip control with fatigue  TODAY'S TREATMENT:  DATE: 08/15/23 NuStep L5x3mins  Resisted gait 20# 4 way x4 CGA  Alt 4 & 6in box taps x10 each STS on airex 2x10  Side step over WaTE 2x5 each HS curls 25#  2x10 Leg ext 10# 2x10  08/04/23 NuStep L5x66mins  5# seated hip flexion, lateral tap on 4" step  5# standing marches w/SPC  Leg ext 10# 2x10 HS curls 25# 2x10 STS on airex 2x10   07/25/23 NuStep L5 x72mins Resisted gait 20# 4 way x4 CGA  Side steps over obstacles CGA Modified sit ups holding yellow ball 2x10  STS holding yellow ball 2x10 Calf raises 2x12 Toe raises 2x12   07/21/23 Assessed and doc goals for renewal Nustep L 5 20# resisted gait 5 x fwd,5 x backward, 3 x each way BERG 39/56   Standing HHA 5# ankle wts stepping over foam roll 2 sets 5 mod A struggled with LLE. Then lateral step 2 set 5 min A Standing HHA 5# ankle wts    07/11/23 NuStep L5 x77mins  Walking on beam Side steps on airex standing by steps  Step ups 6" with hand rail  STS on airex with red ball chest press 2x10 Calf raises 2x10  Calf stretch 15s x2 Toe raises 2x10 Walking without AD 2 big laps    07/07/23 Goal assessment Nustep L 6 6 min  Obstacle course with SPC stepping on and over objects CGA On foam mat stepping on/off laterally- min A 6 inch alt tap 20 x 2 sets 6 inch side step 15 x each side HS curl 25# 2 sets 10 Knee ext 10# 2 sets 12   07/04/23 NuStep L5x50mins  STS on airex 2x10 Box taps 6" w/CGA  Leg curls 20# 2x10 Leg ext 10# 2x5 Side steps on beam  Walking 2 small laps with SPC, then 2 w/o   06/27/23 NuStep L5 x22mins  STS with ball 2x10 Side steps along mat  Leg ext 5# x10, 10# 2x5 HS curls 20# 2x10 Box taps 6" w/SPC and then w/o- CGA  Walking laps 2 laps with SPC, 1 lap w/o  Physioball flexion stretch x10, x5 rotations    06/23/23 Nustep L 5 walked 200 feet in 90 sec with SPC- SOB then walked without AD 100 feet 43 sec then stopped d/t weakness and increased lateral sway - pt really wants to amb without AD HS curl 20# 2 sets 10 then SL 10# 10 x each Knee ext 8# 2 sets 10 ( 10# was too heavy) then SL 5# 5 x each ( LLE very crepitus with decreased  ROM) Resisted Gait 5 x fwd, 5 x backward, 3 x each side 20# CGA with cuing 4 inch alt step tap 20 x  then 6 inch 20x STS with wt ball 10 x    06/20/23: Nustep level 5 6 min 30 sec Sit to stand with yellow mediball press from hi/lo table 10x Supine with physioball under thighs for LTR 10x Supine physioball under heels, B knee to chest with post pelvic tilt( abdominal cxn) 15x Supine physioball under calves, alt SLR cues to lock knee into ext and stabilize ball with post thigh 10x Supine physioball under thighs for bridging 15x Hooklying for theraband resisted hip abd/ER blue band Side lying for hip abd 10 x each, manual assist by PT for R leg Side lying for clam shells, 10 x each  Seated long arc quads, 5#, 10 x 3 sets each  Education details: POC, goals Person educated:  Patient Education method: Explanation, Demonstration, Tactile cues, Verbal cues, and Handouts Education comprehension: verbalized understanding, returned demonstration, verbal cues required, and tactile cues required  HOME EXERCISE PROGRAM: Access Code: [email or web address] URL: [email or web address] Date: 05/16/2023 Prepared by: Amy Speaks  Exercises - Supine Lower Trunk Rotation  - 1 x daily - 3 x weekly - 3 sets - 10 reps - Hooklying Single Knee to Chest  - 1 x daily - 3 x weekly - 3 sets - 10 reps - Supine Bridge  - 1 x daily - 3 x weekly - 3 sets - 10 reps  ASSESSMENT:  CLINICAL IMPRESSION:  Pt enters today reporting some upper leg pain. Continued working on some functional strengthening to help improve general strength and ability to be more mobile. She had good balance with sit to stands but she had to use her UE to stand.. Pt continues to show improves with skilled therapy and will continue to benefit from services.   OBJECTIVE IMPAIRMENTS: decreased activity tolerance, decreased balance, decreased endurance, difficulty walking, decreased strength, impaired perceived functional ability, postural  dysfunction, and pain.   ACTIVITY LIMITATIONS: carrying, lifting, bending, standing, squatting, stairs, transfers, bathing, and locomotion level  PARTICIPATION LIMITATIONS: cleaning and laundry  PERSONAL FACTORS: Age, Behavior pattern, Fitness, Past/current experiences, Time since onset of injury/illness/exacerbation, Transportation, and 3+ comorbidities: h/o brain tumor, visual loss , DM  are also affecting patient's functional outcome.   REHAB POTENTIAL: Good  CLINICAL DECISION MAKING: Evolving/moderate complexity  EVALUATION COMPLEXITY: Moderate   GOALS: Goals reviewed with patient? Yes  SHORT TERM GOALS: Target date: 2 weeks   I HEP Baseline:initiated at eval Goal status: 06/16/23 MET   LONG TERM GOALS: Target date: 07/11/2023  30 sec sit to stand 11 Baseline: 8 Goal status: 06/23/23  progressing 10 x from mat without UE 07/07/23 10 x mat without UE progressing 07/21/23 10x ,11 x in 33.4 sec  2.  TUG improve to 14 or less Baseline: 20.15 Goal status:  06/23/23 10.33 sec MET no AD  3.  2 min walk test, 250' or greater Baseline: walked 90 ' in one min and had to stop limited by pain Goal status: 06/23/23 progressing walked 200 feet in 90 sec with SPC- SOB then walked without AD 100 feet 43 sec then stopped d/t weakness and increased lateral sway  10/10 /24 amb 200 feet without AD slight limp on RT 1 min 28 sec progressing 07/21/23 2 big laps in gym 200 feet no AD 1 min 30 sec- decreased control with rt step,lands hard on RT esp as she fatigues. SPC 1 min 10 sec with smoother stride and better control of RT LE  4.  Modified oswestry 36/50 Baseline: 26/50 Goal status: INITIAL  5. Patient will improve BERG score to 45/56 or better   Baseline: 39/56  Goal status: INITIAL    PLAN:  PT FREQUENCY: 2x/week  PT DURATION: 8 weeks  PLANNED INTERVENTIONS: Therapeutic exercises, Therapeutic activity, Neuromuscular re-education, Balance training, Gait training, Patient/Family  education, Self Care, and Joint mobilization.  PLAN FOR NEXT SESSION: initiate cardiovascular equipment in gym, progress hip, LE strength, stability in supine, standing, sitting. Practice gait without AD   Grayce Sessions, PTA,  08/15/2023, 10:58 AM [facility] [place], [place], [address] Phone: [phone]   Fax:  [phone]

## 2023-08-18 ENCOUNTER — Ambulatory Visit: Payer: Medicare Other

## 2023-08-18 DIAGNOSIS — M6281 Muscle weakness (generalized): Secondary | ICD-10-CM

## 2023-08-18 DIAGNOSIS — R262 Difficulty in walking, not elsewhere classified: Secondary | ICD-10-CM

## 2023-08-18 DIAGNOSIS — R2681 Unsteadiness on feet: Secondary | ICD-10-CM

## 2023-08-18 DIAGNOSIS — R278 Other lack of coordination: Secondary | ICD-10-CM

## 2023-08-18 DIAGNOSIS — G8929 Other chronic pain: Secondary | ICD-10-CM

## 2023-08-18 NOTE — Therapy (Signed)
OUTPATIENT PHYSICAL THERAPY THORACOLUMBAR TREATMENT   Patient Name: Olivia Shah MRN: [id number] DOB:26-Jun-1956, 67 y.o., female Today's Date: 08/18/2023  END OF SESSION:  PT End of Session - 08/18/23 1655     Visit Number 16    Date for PT Re-Evaluation 08/26/23    PT Start Time 1655    PT Stop Time 1740    PT Time Calculation (min) 45 min    Activity Tolerance Patient tolerated treatment well    Behavior During Therapy [facility] for tasks assessed/performed                       Past Medical History:  Diagnosis Date   Arthritis    Diabetes mellitus without complication (HCC)    Hypertension    Past Surgical History:  Procedure Laterality Date   APPLICATION OF CRANIAL NAVIGATION N/A 05/18/2021   Procedure: APPLICATION OF CRANIAL NAVIGATION;  Surgeon: Jadene Pierini, MD;  Location: [facility] OR;  Service: Neurosurgery;  Laterality: N/A;   BREAST BIOPSY Right    CHOLECYSTECTOMY     CRANIOTOMY Right 05/18/2021   Procedure: Right craniotomy for tumor resection;  Surgeon: Jadene Pierini, MD;  Location: [facility] OR;  Service: Neurosurgery;  Laterality: Right;   EYE SURGERY     as a child   HERNIA REPAIR     Patient Active Problem List   Diagnosis Date Noted   Abnormal findings on diagnostic imaging of other specified body structures 01/14/2022   Chronic pain 01/14/2022   Colon cancer screening 01/14/2022   Dysphagia 01/14/2022   Family history of malignant neoplasm of digestive organs 01/14/2022   Hyperglycemia due to type 2 diabetes mellitus (HCC) 01/14/2022   Hypertension 01/14/2022   Meralgia paresthetica 01/14/2022   Mixed hyperlipidemia 01/14/2022   Morbid obesity (HCC) 01/14/2022   Overweight 01/14/2022   Pure hypercholesterolemia 01/14/2022   Spinal cord disease (HCC) 01/14/2022   Chronic cough 12/01/2021   Diabetes (HCC) 07/08/2021   Glaucoma 07/08/2021   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   Visual field defect of left eye 07/08/2021   Debility 06/16/2021    Hypokalemia    Seizures (HCC)    Benign essential HTN    Controlled type 2 diabetes mellitus with hyperglycemia, without long-term current use of insulin (HCC)    Stridor 06/05/2021   Status post tracheostomy (HCC)    Acute respiratory failure with hypoxia (HCC)    Status epilepticus (HCC)    Brain tumor (HCC) 05/18/2021    PCP: Renford Dills, MD  REFERRING PROVIDER: Donalee Citrin, MD  REFERRING DIAG: LS spinal stenosis with neurogenic claudication  Rationale for Evaluation and Treatment: Rehabilitation  THERAPY DIAG:  Muscle weakness (generalized)  Difficulty in walking, not elsewhere classified  Chronic bilateral low back pain with bilateral sciatica  Unsteadiness on feet  Other lack of coordination  ONSET DATE: chronic  SUBJECTIVE:  SUBJECTIVE STATEMENT: Doing fine, felt okay after last visit.   PERTINENT HISTORY:  The patient has history of chronic lower back pain, participated in PT over 4 months ago for similar problem. Reports increased Sx and progression now of pain to ant thighs , was B post thighs and LB.  Referred to PT by neurologist  PAIN:  Are you having pain 4/10 PRECAUTIONS: Fall  RED FLAGS: None   WEIGHT BEARING RESTRICTIONS: No  FALLS:  Has patient fallen in last 6 months? No  LIVING ENVIRONMENT: Lives with: lives alone Lives in: House/apartment Stairs:  ramp Has following equipment at home: Single point cane, Environmental consultant - 2 wheeled, Environmental consultant - 4 wheeled, and Ramped entry  OCCUPATION: works in a church 4 days a week  PLOF: Independent with household mobility with device, Needs assistance with ADLs, and Needs assistance with homemaking  PATIENT GOALS: reduce pain for better standing tolerance  NEXT MD VISIT: unclear  OBJECTIVE:   DIAGNOSTIC FINDINGS:   na  PATIENT SURVEYS:  Modified Oswestry 26/50   SCREENING FOR RED FLAGS: Bowel or bladder incontinence: No Spinal tumors: No Cauda equina syndrome: No Compression fracture: No Abdominal aneurysm: No  COGNITION: Overall cognitive status: Within functional limits for tasks assessed     SENSATION: Light touch intact B LE's today  MUSCLE LENGTH: Hamstrings: Right wfl deg; Left wfl deg Maisie Fus test: Right wfl deg; Left wfl deg  POSTURE:  narrowed stance B, trendelenburg lurch B, with increased sway with gait, increased lumbar lordosis  PALPATION: Non tender with palpation lumbar paraspinals and B post hip musculature  LUMBAR ROM:   AROM eval  Flexion 80%  Extension 50%  Right lateral flexion 50  Left lateral flexion 50  Right rotation   Left rotation    (Blank rows = not tested)  LOWER EXTREMITY ROM:   all LE AAROM WNL B LOWER EXTREMITY MMT:    MMT Right eval Left eval  Hip flexion 3 3-  Hip extension 3+ 3+  Hip abduction 3 3-  Hip adduction    Hip internal rotation    Hip external rotation    Knee flexion    Knee extension 4 4  Ankle dorsiflexion    Ankle plantarflexion 4- 4-  Ankle inversion    Ankle eversion     (Blank rows = not tested)  LUMBAR SPECIAL TESTS:  Straight leg raise test: Negative and FABER test: Negative  FUNCTIONAL TESTS  30 seconds chair stand test 8 reps Timed up and go (TUG): 20.15 2 minute walk test: 90', 1 min 10 sec, stopped due to B thigh pain  GAIT: Distance walked: 52' Assistive device utilized: Single point cane Level of assistance: Modified independence Comments: fatigued quickly, increased trunk sway and decreased hip control with fatigue  TODAY'S TREATMENT:  DATE: 08/18/23 NuStep L5 x78mins  Box taps 6"  Calf raises 2x10 Toe raises 2x10 STS holding yellow ball 2x10 LAQ 5# 2x10  HS curls  green 25# 2x10 On airex marching using SPC- minA  Standing row and ext green band 2x10  08/15/23 NuStep L5x4mins  Resisted gait 20# 4 way x4 CGA  Alt 4 & 6in box taps x10 each STS on airex 2x10  Side step over WaTE 2x5 each HS curls 25# 2x10 Leg ext 10# 2x10  08/04/23 NuStep L5x34mins  5# seated hip flexion, lateral tap on 4" step  5# standing marches w/SPC  Leg ext 10# 2x10 HS curls 25# 2x10 STS on airex 2x10   07/25/23 NuStep L5 x23mins Resisted gait 20# 4 way x4 CGA  Side steps over obstacles CGA Modified sit ups holding yellow ball 2x10  STS holding yellow ball 2x10 Calf raises 2x12 Toe raises 2x12   07/21/23 Assessed and doc goals for renewal Nustep L 5 20# resisted gait 5 x fwd,5 x backward, 3 x each way BERG 39/56   Standing HHA 5# ankle wts stepping over foam roll 2 sets 5 mod A struggled with LLE. Then lateral step 2 set 5 min A Standing HHA 5# ankle wts    07/11/23 NuStep L5 x70mins  Walking on beam Side steps on airex standing by steps  Step ups 6" with hand rail  STS on airex with red ball chest press 2x10 Calf raises 2x10  Calf stretch 15s x2 Toe raises 2x10 Walking without AD 2 big laps    07/07/23 Goal assessment Nustep L 6 6 min  Obstacle course with SPC stepping on and over objects CGA On foam mat stepping on/off laterally- min A 6 inch alt tap 20 x 2 sets 6 inch side step 15 x each side HS curl 25# 2 sets 10 Knee ext 10# 2 sets 12   07/04/23 NuStep L5x34mins  STS on airex 2x10 Box taps 6" w/CGA  Leg curls 20# 2x10 Leg ext 10# 2x5 Side steps on beam  Walking 2 small laps with SPC, then 2 w/o   06/27/23 NuStep L5 x36mins  STS with ball 2x10 Side steps along mat  Leg ext 5# x10, 10# 2x5 HS curls 20# 2x10 Box taps 6" w/SPC and then w/o- CGA  Walking laps 2 laps with SPC, 1 lap w/o  Physioball flexion stretch x10, x5 rotations    06/23/23 Nustep L 5 walked 200 feet in 90 sec with SPC- SOB then walked without AD 100  feet 43 sec then stopped d/t weakness and increased lateral sway - pt really wants to amb without AD HS curl 20# 2 sets 10 then SL 10# 10 x each Knee ext 8# 2 sets 10 ( 10# was too heavy) then SL 5# 5 x each ( LLE very crepitus with decreased ROM) Resisted Gait 5 x fwd, 5 x backward, 3 x each side 20# CGA with cuing 4 inch alt step tap 20 x  then 6 inch 20x STS with wt ball 10 x    06/20/23: Nustep level 5 6 min 30 sec Sit to stand with yellow mediball press from hi/lo table 10x Supine with physioball under thighs for LTR 10x Supine physioball under heels, B knee to chest with post pelvic tilt( abdominal cxn) 15x Supine physioball under calves, alt SLR cues to lock knee into ext and stabilize ball with post thigh 10x Supine physioball under thighs for bridging 15x Hooklying for theraband resisted hip  abd/ER blue band Side lying for hip abd 10 x each, manual assist by PT for R leg Side lying for clam shells, 10 x each  Seated long arc quads, 5#, 10 x 3 sets each  Education details: POC, goals Person educated: Patient Education method: Explanation, Demonstration, Tactile cues, Verbal cues, and Handouts Education comprehension: verbalized understanding, returned demonstration, verbal cues required, and tactile cues required  HOME EXERCISE PROGRAM: Access Code: [email or web address] URL: [email or web address] Date: 05/16/2023 Prepared by: Amy Speaks  Exercises - Supine Lower Trunk Rotation  - 1 x daily - 3 x weekly - 3 sets - 10 reps - Hooklying Single Knee to Chest  - 1 x daily - 3 x weekly - 3 sets - 10 reps - Supine Bridge  - 1 x daily - 3 x weekly - 3 sets - 10 reps  ASSESSMENT:  CLINICAL IMPRESSION:  Pt  continues to do well working on some functional strengthening to help improve general strength and ability to be more mobile. She does well with box tap on 6" without AD and demonstrate good balance as long as she does not go too fast. Pt continues to show improves with  skilled therapy and will continue to benefit from services.   OBJECTIVE IMPAIRMENTS: decreased activity tolerance, decreased balance, decreased endurance, difficulty walking, decreased strength, impaired perceived functional ability, postural dysfunction, and pain.   ACTIVITY LIMITATIONS: carrying, lifting, bending, standing, squatting, stairs, transfers, bathing, and locomotion level  PARTICIPATION LIMITATIONS: cleaning and laundry  PERSONAL FACTORS: Age, Behavior pattern, Fitness, Past/current experiences, Time since onset of injury/illness/exacerbation, Transportation, and 3+ comorbidities: h/o brain tumor, visual loss , DM  are also affecting patient's functional outcome.   REHAB POTENTIAL: Good  CLINICAL DECISION MAKING: Evolving/moderate complexity  EVALUATION COMPLEXITY: Moderate   GOALS: Goals reviewed with patient? Yes  SHORT TERM GOALS: Target date: 2 weeks   I HEP Baseline:initiated at eval Goal status: 06/16/23 MET   LONG TERM GOALS: Target date: 07/11/2023  30 sec sit to stand 11 Baseline: 8 Goal status: 06/23/23  progressing 10 x from mat without UE 07/07/23 10 x mat without UE progressing 07/21/23 10x ,11 x in 33.4 sec  2.  TUG improve to 14 or less Baseline: 20.15 Goal status:  06/23/23 10.33 sec MET no AD  3.  2 min walk test, 250' or greater Baseline: walked 90 ' in one min and had to stop limited by pain Goal status: 06/23/23 progressing walked 200 feet in 90 sec with SPC- SOB then walked without AD 100 feet 43 sec then stopped d/t weakness and increased lateral sway  10/10 /24 amb 200 feet without AD slight limp on RT 1 min 28 sec progressing 07/21/23 2 big laps in gym 200 feet no AD 1 min 30 sec- decreased control with rt step,lands hard on RT esp as she fatigues. SPC 1 min 10 sec with smoother stride and better control of RT LE  4.  Modified oswestry 36/50 Baseline: 26/50 Goal status: INITIAL  5. Patient will improve BERG score to 45/56 or better    Baseline: 39/56  Goal status: INITIAL    PLAN:  PT FREQUENCY: 2x/week  PT DURATION: 8 weeks  PLANNED INTERVENTIONS: Therapeutic exercises, Therapeutic activity, Neuromuscular re-education, Balance training, Gait training, Patient/Family education, Self Care, and Joint mobilization.  PLAN FOR NEXT SESSION: initiate cardiovascular equipment in gym, progress hip, LE strength, stability in supine, standing, sitting. Practice gait without AD   Cassie Freer, PT,  08/18/2023, 5:40 PM [place]  [facility] [place], [place], [address] Phone: [phone]   Fax:  [phone]

## 2023-08-22 ENCOUNTER — Ambulatory Visit: Payer: Medicare Other | Admitting: Physical Therapy

## 2023-08-22 ENCOUNTER — Encounter: Payer: Self-pay | Admitting: Physical Therapy

## 2023-08-22 DIAGNOSIS — G8929 Other chronic pain: Secondary | ICD-10-CM

## 2023-08-22 DIAGNOSIS — M6281 Muscle weakness (generalized): Secondary | ICD-10-CM

## 2023-08-22 DIAGNOSIS — M5459 Other low back pain: Secondary | ICD-10-CM

## 2023-08-22 DIAGNOSIS — R262 Difficulty in walking, not elsewhere classified: Secondary | ICD-10-CM

## 2023-08-22 DIAGNOSIS — R2681 Unsteadiness on feet: Secondary | ICD-10-CM

## 2023-08-22 NOTE — Therapy (Signed)
OUTPATIENT PHYSICAL THERAPY THORACOLUMBAR TREATMENT   Patient Name: Olivia Shah MRN: [id number] DOB:[birth year], 67 y.o., female Today's Date: 08/22/2023  END OF SESSION:  PT End of Session - 08/22/23 1058     Visit Number 17    Date for PT Re-Evaluation 08/26/23    PT Start Time 1100    PT Stop Time 1145    PT Time Calculation (min) 45 min    Activity Tolerance Patient tolerated treatment well    Behavior During Therapy [facility] for tasks assessed/performed                       Past Medical History:  Diagnosis Date   Arthritis    Diabetes mellitus without complication (HCC)    Hypertension    Past Surgical History:  Procedure Laterality Date   APPLICATION OF CRANIAL NAVIGATION N/A 05/18/2021   Procedure: APPLICATION OF CRANIAL NAVIGATION;  Surgeon: Jadene Pierini, MD;  Location: [facility] OR;  Service: Neurosurgery;  Laterality: N/A;   BREAST BIOPSY Right    CHOLECYSTECTOMY     CRANIOTOMY Right 05/18/2021   Procedure: Right craniotomy for tumor resection;  Surgeon: Jadene Pierini, MD;  Location: [facility] OR;  Service: Neurosurgery;  Laterality: Right;   EYE SURGERY     as a child   HERNIA REPAIR     Patient Active Problem List   Diagnosis Date Noted   Abnormal findings on diagnostic imaging of other specified body structures 01/14/2022   Chronic pain 01/14/2022   Colon cancer screening 01/14/2022   Dysphagia 01/14/2022   Family history of malignant neoplasm of digestive organs 01/14/2022   Hyperglycemia due to type 2 diabetes mellitus (HCC) 01/14/2022   Hypertension 01/14/2022   Meralgia paresthetica 01/14/2022   Mixed hyperlipidemia 01/14/2022   Morbid obesity (HCC) 01/14/2022   Overweight 01/14/2022   Pure hypercholesterolemia 01/14/2022   Spinal cord disease (HCC) 01/14/2022   Chronic cough 12/01/2021   Diabetes (HCC) 07/08/2021   Glaucoma 07/08/2021   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   Visual field defect of left eye 07/08/2021   Debility 06/16/2021    Hypokalemia    Seizures (HCC)    Benign essential HTN    Controlled type 2 diabetes mellitus with hyperglycemia, without long-term current use of insulin (HCC)    Stridor 06/05/2021   Status post tracheostomy (HCC)    Acute respiratory failure with hypoxia (HCC)    Status epilepticus (HCC)    Brain tumor (HCC) 05/18/2021    PCP: Renford Dills, MD  REFERRING PROVIDER: Donalee Citrin, MD  REFERRING DIAG: LS spinal stenosis with neurogenic claudication  Rationale for Evaluation and Treatment: Rehabilitation  THERAPY DIAG:  Muscle weakness (generalized)  Difficulty in walking, not elsewhere classified  Chronic bilateral low back pain with bilateral sciatica  Unsteadiness on feet  Other low back pain  ONSET DATE: chronic  SUBJECTIVE:  SUBJECTIVE STATEMENT: "My legs are hurting, stared not to come"  PERTINENT HISTORY:  The patient has history of chronic lower back pain, participated in PT over 4 months ago for similar problem. Reports increased Sx and progression now of pain to ant thighs , was B post thighs and LB.  Referred to PT by neurologist  PAIN:  Are you having pain 6/10 Both legs PRECAUTIONS: Fall  RED FLAGS: None   WEIGHT BEARING RESTRICTIONS: No  FALLS:  Has patient fallen in last 6 months? No  LIVING ENVIRONMENT: Lives with: lives alone Lives in: House/apartment Stairs:  ramp Has following equipment at home: Single point cane, Environmental consultant - 2 wheeled, Environmental consultant - 4 wheeled, and Ramped entry  OCCUPATION: works in a church 4 days a week  PLOF: Independent with household mobility with device, Needs assistance with ADLs, and Needs assistance with homemaking  PATIENT GOALS: reduce pain for better standing tolerance  NEXT MD VISIT: unclear  OBJECTIVE:   DIAGNOSTIC FINDINGS:   na  PATIENT SURVEYS:  Modified Oswestry 26/50   SCREENING FOR RED FLAGS: Bowel or bladder incontinence: No Spinal tumors: No Cauda equina syndrome: No Compression fracture: No Abdominal aneurysm: No  COGNITION: Overall cognitive status: Within functional limits for tasks assessed     SENSATION: Light touch intact B LE's today  MUSCLE LENGTH: Hamstrings: Right wfl deg; Left wfl deg Maisie Fus test: Right wfl deg; Left wfl deg  POSTURE:  narrowed stance B, trendelenburg lurch B, with increased sway with gait, increased lumbar lordosis  PALPATION: Non tender with palpation lumbar paraspinals and B post hip musculature  LUMBAR ROM:   AROM eval  Flexion 80%  Extension 50%  Right lateral flexion 50  Left lateral flexion 50  Right rotation   Left rotation    (Blank rows = not tested)  LOWER EXTREMITY ROM:   all LE AAROM WNL B LOWER EXTREMITY MMT:    MMT Right eval Left eval  Hip flexion 3 3-  Hip extension 3+ 3+  Hip abduction 3 3-  Hip adduction    Hip internal rotation    Hip external rotation    Knee flexion    Knee extension 4 4  Ankle dorsiflexion    Ankle plantarflexion 4- 4-  Ankle inversion    Ankle eversion     (Blank rows = not tested)  LUMBAR SPECIAL TESTS:  Straight leg raise test: Negative and FABER test: Negative  FUNCTIONAL TESTS  30 seconds chair stand test 8 reps Timed up and go (TUG): 20.15 2 minute walk test: 90', 1 min 10 sec, stopped due to B thigh pain  GAIT: Distance walked: 34' Assistive device utilized: Single point cane Level of assistance: Modified independence Comments: fatigued quickly, increased trunk sway and decreased hip control with fatigue  TODAY'S TREATMENT:  DATE: 08/22/23 NuStep L5 x39mins  HS curls 25lb 2x10 Leg Ext 5lb 2x10 Alt 6in box taps form airex w/ SPC 2x10 S2S LE on airex  2x10 Standing on airex row and ext green band 2x10 Side steps over obstacles CGA  08/18/23 NuStep L5 x57mins  Box taps 6"  Calf raises 2x10 Toe raises 2x10 STS holding yellow ball 2x10 LAQ 5# 2x10  HS curls green 25# 2x10 On airex marching using SPC- minA  Standing row and ext green band 2x10  08/15/23 NuStep L5x21mins  Resisted gait 20# 4 way x4 CGA  Alt 4 & 6in box taps x10 each STS on airex 2x10  Side step over WaTE 2x5 each HS curls 25# 2x10 Leg ext 10# 2x10  08/04/23 NuStep L5x82mins  5# seated hip flexion, lateral tap on 4" step  5# standing marches w/SPC  Leg ext 10# 2x10 HS curls 25# 2x10 STS on airex 2x10   07/25/23 NuStep L5 x29mins Resisted gait 20# 4 way x4 CGA  Side steps over obstacles CGA Modified sit ups holding yellow ball 2x10  STS holding yellow ball 2x10 Calf raises 2x12 Toe raises 2x12   07/21/23 Assessed and doc goals for renewal Nustep L 5 20# resisted gait 5 x fwd,5 x backward, 3 x each way BERG 39/56   Standing HHA 5# ankle wts stepping over foam roll 2 sets 5 mod A struggled with LLE. Then lateral step 2 set 5 min A Standing HHA 5# ankle wts    07/11/23 NuStep L5 x42mins  Walking on beam Side steps on airex standing by steps  Step ups 6" with hand rail  STS on airex with red ball chest press 2x10 Calf raises 2x10  Calf stretch 15s x2 Toe raises 2x10 Walking without AD 2 big laps    07/07/23 Goal assessment Nustep L 6 6 min  Obstacle course with SPC stepping on and over objects CGA On foam mat stepping on/off laterally- min A 6 inch alt tap 20 x 2 sets 6 inch side step 15 x each side HS curl 25# 2 sets 10 Knee ext 10# 2 sets 12   07/04/23 NuStep L5x46mins  STS on airex 2x10 Box taps 6" w/CGA  Leg curls 20# 2x10 Leg ext 10# 2x5 Side steps on beam  Walking 2 small laps with SPC, then 2 w/o   06/27/23 NuStep L5 x27mins  STS with ball 2x10 Side steps along mat  Leg ext 5# x10, 10# 2x5 HS curls 20# 2x10 Box taps  6" w/SPC and then w/o- CGA  Walking laps 2 laps with SPC, 1 lap w/o  Physioball flexion stretch x10, x5 rotations    06/23/23 Nustep L 5 walked 200 feet in 90 sec with SPC- SOB then walked without AD 100 feet 43 sec then stopped d/t weakness and increased lateral sway - pt really wants to amb without AD HS curl 20# 2 sets 10 then SL 10# 10 x each Knee ext 8# 2 sets 10 ( 10# was too heavy) then SL 5# 5 x each ( LLE very crepitus with decreased ROM) Resisted Gait 5 x fwd, 5 x backward, 3 x each side 20# CGA with cuing 4 inch alt step tap 20 x  then 6 inch 20x STS with wt ball 10 x   Education details: POC, goals Person educated: Patient Education method: Explanation, Demonstration, Tactile cues, Verbal cues, and Handouts Education comprehension: verbalized understanding, returned demonstration, verbal cues required, and tactile cues required  HOME  EXERCISE PROGRAM: Access Code: [email or web address] URL: [email or web address] Date: 05/16/2023 Prepared by: Amy Speaks  Exercises - Supine Lower Trunk Rotation  - 1 x daily - 3 x weekly - 3 sets - 10 reps - Hooklying Single Knee to Chest  - 1 x daily - 3 x weekly - 3 sets - 10 reps - Supine Bridge  - 1 x daily - 3 x weekly - 3 sets - 10 reps  ASSESSMENT:  CLINICAL IMPRESSION:  Pt  continues to do well working on some functional strengthening to help improve general strength and ability to be more mobile. She does well with box tap on on airex demonstrate good balance as long as she does not go too fast.  Cues were needed for pacing. Increase fatigue noted with sit to stands. Pt continues to show improves with skilled therapy and will continue to benefit from services.   OBJECTIVE IMPAIRMENTS: decreased activity tolerance, decreased balance, decreased endurance, difficulty walking, decreased strength, impaired perceived functional ability, postural dysfunction, and pain.   ACTIVITY LIMITATIONS: carrying, lifting, bending, standing,  squatting, stairs, transfers, bathing, and locomotion level  PARTICIPATION LIMITATIONS: cleaning and laundry  PERSONAL FACTORS: Age, Behavior pattern, Fitness, Past/current experiences, Time since onset of injury/illness/exacerbation, Transportation, and 3+ comorbidities: h/o brain tumor, visual loss , DM  are also affecting patient's functional outcome.   REHAB POTENTIAL: Good  CLINICAL DECISION MAKING: Evolving/moderate complexity  EVALUATION COMPLEXITY: Moderate   GOALS: Goals reviewed with patient? Yes  SHORT TERM GOALS: Target date: 2 weeks   I HEP Baseline:initiated at eval Goal status: 06/16/23 MET   LONG TERM GOALS: Target date: 07/11/2023  30 sec sit to stand 11 Baseline: 8 Goal status: 06/23/23  progressing 10 x from mat without UE 07/07/23 10 x mat without UE progressing 07/21/23 10x ,11 x in 33.4 sec  2.  TUG improve to 14 or less Baseline: 20.15 Goal status:  06/23/23 10.33 sec MET no AD  3.  2 min walk test, 250' or greater Baseline: walked 90 ' in one min and had to stop limited by pain Goal status: 06/23/23 progressing walked 200 feet in 90 sec with SPC- SOB then walked without AD 100 feet 43 sec then stopped d/t weakness and increased lateral sway  10/10 /24 amb 200 feet without AD slight limp on RT 1 min 28 sec progressing 07/21/23 2 big laps in gym 200 feet no AD 1 min 30 sec- decreased control with rt step,lands hard on RT esp as she fatigues. SPC 1 min 10 sec with smoother stride and better control of RT LE  4.  Modified oswestry 36/50 Baseline: 26/50 Goal status: INITIAL  5. Patient will improve BERG score to 45/56 or better   Baseline: 39/56  Goal status: INITIAL    PLAN:  PT FREQUENCY: 2x/week  PT DURATION: 8 weeks  PLANNED INTERVENTIONS: Therapeutic exercises, Therapeutic activity, Neuromuscular re-education, Balance training, Gait training, Patient/Family education, Self Care, and Joint mobilization.  PLAN FOR NEXT SESSION: initiate  cardiovascular equipment in gym, progress hip, LE strength, stability in supine, standing, sitting. Practice gait without AD   Grayce Sessions, PTA,  08/22/2023, 10:59 AM [place] [facility] at [facility] [address] [facility] [place], [place], [address] Phone: [phone]   Fax:  [phone]

## 2023-09-02 NOTE — Therapy (Incomplete)
OUTPATIENT PHYSICAL THERAPY THORACOLUMBAR TREATMENT   Patient Name: Olivia Shah MRN: [id number] DOB:[birth year], 67 y.o., female Today's Date: 09/02/2023  END OF SESSION:              Past Medical History:  Diagnosis Date   Arthritis    Diabetes mellitus without complication (HCC)    Hypertension    Past Surgical History:  Procedure Laterality Date   APPLICATION OF CRANIAL NAVIGATION N/A 05/18/2021   Procedure: APPLICATION OF CRANIAL NAVIGATION;  Surgeon: Jadene Pierini, MD;  Location: [facility] OR;  Service: Neurosurgery;  Laterality: N/A;   BREAST BIOPSY Right    CHOLECYSTECTOMY     CRANIOTOMY Right 05/18/2021   Procedure: Right craniotomy for tumor resection;  Surgeon: Jadene Pierini, MD;  Location: [facility] OR;  Service: Neurosurgery;  Laterality: Right;   EYE SURGERY     as a child   HERNIA REPAIR     Patient Active Problem List   Diagnosis Date Noted   Abnormal findings on diagnostic imaging of other specified body structures 01/14/2022   Chronic pain 01/14/2022   Colon cancer screening 01/14/2022   Dysphagia 01/14/2022   Family history of malignant neoplasm of digestive organs 01/14/2022   Hyperglycemia due to type 2 diabetes mellitus (HCC) 01/14/2022   Hypertension 01/14/2022   Meralgia paresthetica 01/14/2022   Mixed hyperlipidemia 01/14/2022   Morbid obesity (HCC) 01/14/2022   Overweight 01/14/2022   Pure hypercholesterolemia 01/14/2022   Spinal cord disease (HCC) 01/14/2022   Chronic cough 12/01/2021   Diabetes (HCC) 07/08/2021   Glaucoma 07/08/2021   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   Visual field defect of left eye 07/08/2021   Debility 06/16/2021   Hypokalemia    Seizures (HCC)    Benign essential HTN    Controlled type 2 diabetes mellitus with hyperglycemia, without long-term current use of insulin (HCC)    Stridor 06/05/2021   Status post tracheostomy (HCC)    Acute respiratory failure with hypoxia (HCC)    Status epilepticus (HCC)    Brain  tumor (HCC) 05/18/2021    PCP: Renford Dills, MD  REFERRING PROVIDER: Donalee Citrin, MD  REFERRING DIAG: LS spinal stenosis with neurogenic claudication  Rationale for Evaluation and Treatment: Rehabilitation  THERAPY DIAG:  No diagnosis found.  ONSET DATE: chronic  SUBJECTIVE:                                                                                                                                                                                           SUBJECTIVE STATEMENT: "My legs are hurting, stared not to come"  PERTINENT HISTORY:  The patient has history of chronic  lower back pain, participated in PT over 4 months ago for similar problem. Reports increased Sx and progression now of pain to ant thighs , was B post thighs and LB.  Referred to PT by neurologist  PAIN:  Are you having pain 6/10 Both legs PRECAUTIONS: Fall  RED FLAGS: None   WEIGHT BEARING RESTRICTIONS: No  FALLS:  Has patient fallen in last 6 months? No  LIVING ENVIRONMENT: Lives with: lives alone Lives in: House/apartment Stairs:  ramp Has following equipment at home: Single point cane, Environmental consultant - 2 wheeled, Environmental consultant - 4 wheeled, and Ramped entry  OCCUPATION: works in a church 4 days a week  PLOF: Independent with household mobility with device, Needs assistance with ADLs, and Needs assistance with homemaking  PATIENT GOALS: reduce pain for better standing tolerance  NEXT MD VISIT: unclear  OBJECTIVE:   DIAGNOSTIC FINDINGS:  na  PATIENT SURVEYS:  Modified Oswestry 26/50   SCREENING FOR RED FLAGS: Bowel or bladder incontinence: No Spinal tumors: No Cauda equina syndrome: No Compression fracture: No Abdominal aneurysm: No  COGNITION: Overall cognitive status: Within functional limits for tasks assessed     SENSATION: Light touch intact B LE's today  MUSCLE LENGTH: Hamstrings: Right wfl deg; Left wfl deg Maisie Fus test: Right wfl deg; Left wfl deg  POSTURE:  narrowed stance B,  trendelenburg lurch B, with increased sway with gait, increased lumbar lordosis  PALPATION: Non tender with palpation lumbar paraspinals and B post hip musculature  LUMBAR ROM:   AROM eval  Flexion 80%  Extension 50%  Right lateral flexion 50  Left lateral flexion 50  Right rotation   Left rotation    (Blank rows = not tested)  LOWER EXTREMITY ROM:   all LE AAROM WNL B LOWER EXTREMITY MMT:    MMT Right eval Left eval  Hip flexion 3 3-  Hip extension 3+ 3+  Hip abduction 3 3-  Hip adduction    Hip internal rotation    Hip external rotation    Knee flexion    Knee extension 4 4  Ankle dorsiflexion    Ankle plantarflexion 4- 4-  Ankle inversion    Ankle eversion     (Blank rows = not tested)  LUMBAR SPECIAL TESTS:  Straight leg raise test: Negative and FABER test: Negative  FUNCTIONAL TESTS  30 seconds chair stand test 8 reps Timed up and go (TUG): 20.15 2 minute walk test: 90', 1 min 10 sec, stopped due to B thigh pain  GAIT: Distance walked: 73' Assistive device utilized: Single point cane Level of assistance: Modified independence Comments: fatigued quickly, increased trunk sway and decreased hip control with fatigue  TODAY'S TREATMENT:                                                                                                                              DATE: 09/05/23 NuStep Side steps over obstacles Standing marches with 3#  Standing hip abd 3#  Calf raises Walking laps   08/22/23 NuStep L5 x15mins  HS curls 25lb 2x10 Leg Ext 5lb 2x10 Alt 6in box taps form airex w/ SPC 2x10 S2S LE on airex 2x10 Standing on airex row and ext green band 2x10 Side steps over obstacles CGA  08/18/23 NuStep L5 x24mins  Box taps 6"  Calf raises 2x10 Toe raises 2x10 STS holding yellow ball 2x10 LAQ 5# 2x10  HS curls green 25# 2x10 On airex marching using SPC- minA  Standing row and ext green band 2x10  08/15/23 NuStep L5x34mins  Resisted gait 20# 4 way x4  CGA  Alt 4 & 6in box taps x10 each STS on airex 2x10  Side step over WaTE 2x5 each HS curls 25# 2x10 Leg ext 10# 2x10  08/04/23 NuStep L5x71mins  5# seated hip flexion, lateral tap on 4" step  5# standing marches w/SPC  Leg ext 10# 2x10 HS curls 25# 2x10 STS on airex 2x10   07/25/23 NuStep L5 x38mins Resisted gait 20# 4 way x4 CGA  Side steps over obstacles CGA Modified sit ups holding yellow ball 2x10  STS holding yellow ball 2x10 Calf raises 2x12 Toe raises 2x12   07/21/23 Assessed and doc goals for renewal Nustep L 5 20# resisted gait 5 x fwd,5 x backward, 3 x each way BERG 39/56   Standing HHA 5# ankle wts stepping over foam roll 2 sets 5 mod A struggled with LLE. Then lateral step 2 set 5 min A Standing HHA 5# ankle wts    07/11/23 NuStep L5 x34mins  Walking on beam Side steps on airex standing by steps  Step ups 6" with hand rail  STS on airex with red ball chest press 2x10 Calf raises 2x10  Calf stretch 15s x2 Toe raises 2x10 Walking without AD 2 big laps    07/07/23 Goal assessment Nustep L 6 6 min  Obstacle course with SPC stepping on and over objects CGA On foam mat stepping on/off laterally- min A 6 inch alt tap 20 x 2 sets 6 inch side step 15 x each side HS curl 25# 2 sets 10 Knee ext 10# 2 sets 12   07/04/23 NuStep L5x65mins  STS on airex 2x10 Box taps 6" w/CGA  Leg curls 20# 2x10 Leg ext 10# 2x5 Side steps on beam  Walking 2 small laps with SPC, then 2 w/o   06/27/23 NuStep L5 x10mins  STS with ball 2x10 Side steps along mat  Leg ext 5# x10, 10# 2x5 HS curls 20# 2x10 Box taps 6" w/SPC and then w/o- CGA  Walking laps 2 laps with SPC, 1 lap w/o  Physioball flexion stretch x10, x5 rotations    06/23/23 Nustep L 5 walked 200 feet in 90 sec with SPC- SOB then walked without AD 100 feet 43 sec then stopped d/t weakness and increased lateral sway - pt really wants to amb without AD HS curl 20# 2 sets 10 then SL 10# 10 x  each Knee ext 8# 2 sets 10 ( 10# was too heavy) then SL 5# 5 x each ( LLE very crepitus with decreased ROM) Resisted Gait 5 x fwd, 5 x backward, 3 x each side 20# CGA with cuing 4 inch alt step tap 20 x  then 6 inch 20x STS with wt ball 10 x   Education details: POC, goals Person educated: Patient Education method: Explanation, Demonstration, Tactile cues, Verbal cues, and Handouts Education comprehension: verbalized understanding, returned demonstration, verbal cues required, and  tactile cues required  HOME EXERCISE PROGRAM: Access Code: [email or web address] URL: [email or web address] Date: 05/16/2023 Prepared by: Amy Speaks  Exercises - Supine Lower Trunk Rotation  - 1 x daily - 3 x weekly - 3 sets - 10 reps - Hooklying Single Knee to Chest  - 1 x daily - 3 x weekly - 3 sets - 10 reps - Supine Bridge  - 1 x daily - 3 x weekly - 3 sets - 10 reps  ASSESSMENT:  CLINICAL IMPRESSION:  Pt  continues to do well working on some functional strengthening to help improve general strength and ability to be more mobile. She does well with box tap on on airex demonstrate good balance as long as she does not go too fast.  Cues were needed for pacing. Increase fatigue noted with sit to stands. Pt continues to show improves with skilled therapy and will continue to benefit from services.   OBJECTIVE IMPAIRMENTS: decreased activity tolerance, decreased balance, decreased endurance, difficulty walking, decreased strength, impaired perceived functional ability, postural dysfunction, and pain.   ACTIVITY LIMITATIONS: carrying, lifting, bending, standing, squatting, stairs, transfers, bathing, and locomotion level  PARTICIPATION LIMITATIONS: cleaning and laundry  PERSONAL FACTORS: Age, Behavior pattern, Fitness, Past/current experiences, Time since onset of injury/illness/exacerbation, Transportation, and 3+ comorbidities: h/o brain tumor, visual loss , DM  are also affecting patient's functional  outcome.   REHAB POTENTIAL: Good  CLINICAL DECISION MAKING: Evolving/moderate complexity  EVALUATION COMPLEXITY: Moderate   GOALS: Goals reviewed with patient? Yes  SHORT TERM GOALS: Target date: 2 weeks   I HEP Baseline:initiated at eval Goal status: 06/16/23 MET   LONG TERM GOALS: Target date: 07/11/2023  30 sec sit to stand 11 Baseline: 8 Goal status: 06/23/23  progressing 10 x from mat without UE 07/07/23 10 x mat without UE progressing 07/21/23 10x ,11 x in 33.4 sec  2.  TUG improve to 14 or less Baseline: 20.15 Goal status:  06/23/23 10.33 sec MET no AD  3.  2 min walk test, 250' or greater Baseline: walked 90 ' in one min and had to stop limited by pain Goal status: 06/23/23 progressing walked 200 feet in 90 sec with SPC- SOB then walked without AD 100 feet 43 sec then stopped d/t weakness and increased lateral sway  10/10 /24 amb 200 feet without AD slight limp on RT 1 min 28 sec progressing 07/21/23 2 big laps in gym 200 feet no AD 1 min 30 sec- decreased control with rt step,lands hard on RT esp as she fatigues. SPC 1 min 10 sec with smoother stride and better control of RT LE  4.  Modified oswestry 36/50 Baseline: 26/50 Goal status: INITIAL  5. Patient will improve BERG score to 45/56 or better   Baseline: 39/56  Goal status: INITIAL    PLAN:  PT FREQUENCY: 2x/week  PT DURATION: 8 weeks  PLANNED INTERVENTIONS: Therapeutic exercises, Therapeutic activity, Neuromuscular re-education, Balance training, Gait training, Patient/Family education, Self Care, and Joint mobilization.  PLAN FOR NEXT SESSION: initiate cardiovascular equipment in gym, progress hip, LE strength, stability in supine, standing, sitting. Practice gait without AD   Cassie Freer, PT,  09/02/2023, 7:53 AM [facility] at [facility] [address] [facility]. [place], [place], [address] Phone: [phone]   Fax:  [phone]

## 2023-09-05 ENCOUNTER — Ambulatory Visit: Payer: Medicare Other

## 2023-09-12 ENCOUNTER — Ambulatory Visit: Payer: Medicare Other | Attending: Neurosurgery | Admitting: Physical Therapy

## 2023-09-12 ENCOUNTER — Encounter: Payer: Self-pay | Admitting: Physical Therapy

## 2023-09-12 DIAGNOSIS — R262 Difficulty in walking, not elsewhere classified: Secondary | ICD-10-CM | POA: Insufficient documentation

## 2023-09-12 DIAGNOSIS — R2681 Unsteadiness on feet: Secondary | ICD-10-CM | POA: Insufficient documentation

## 2023-09-12 DIAGNOSIS — G8929 Other chronic pain: Secondary | ICD-10-CM | POA: Diagnosis present

## 2023-09-12 DIAGNOSIS — M5441 Lumbago with sciatica, right side: Secondary | ICD-10-CM | POA: Diagnosis present

## 2023-09-12 DIAGNOSIS — M5442 Lumbago with sciatica, left side: Secondary | ICD-10-CM | POA: Diagnosis present

## 2023-09-12 DIAGNOSIS — M6281 Muscle weakness (generalized): Secondary | ICD-10-CM | POA: Insufficient documentation

## 2023-09-12 NOTE — Therapy (Signed)
OUTPATIENT PHYSICAL THERAPY THORACOLUMBAR TREATMENT   Patient Name: Olivia Shah MRN: [id number] DOB:16-Mar-1956, 67 y.o., female Today's Date: 09/12/2023  END OF SESSION:  PT End of Session - 09/12/23 1058     Visit Number 18    Date for PT Re-Evaluation 10/13/23    PT Start Time 1100    PT Stop Time 1145    PT Time Calculation (min) 45 min    Activity Tolerance Patient tolerated treatment well    Behavior During Therapy [facility] for tasks assessed/performed                       Past Medical History:  Diagnosis Date   Arthritis    Diabetes mellitus without complication (HCC)    Hypertension    Past Surgical History:  Procedure Laterality Date   APPLICATION OF CRANIAL NAVIGATION N/A 05/18/2021   Procedure: APPLICATION OF CRANIAL NAVIGATION;  Surgeon: Jadene Pierini, MD;  Location: [facility] OR;  Service: Neurosurgery;  Laterality: N/A;   BREAST BIOPSY Right    CHOLECYSTECTOMY     CRANIOTOMY Right 05/18/2021   Procedure: Right craniotomy for tumor resection;  Surgeon: Jadene Pierini, MD;  Location: [facility] OR;  Service: Neurosurgery;  Laterality: Right;   EYE SURGERY     as a child   HERNIA REPAIR     Patient Active Problem List   Diagnosis Date Noted   Abnormal findings on diagnostic imaging of other specified body structures 01/14/2022   Chronic pain 01/14/2022   Colon cancer screening 01/14/2022   Dysphagia 01/14/2022   Family history of malignant neoplasm of digestive organs 01/14/2022   Hyperglycemia due to type 2 diabetes mellitus (HCC) 01/14/2022   Hypertension 01/14/2022   Meralgia paresthetica 01/14/2022   Mixed hyperlipidemia 01/14/2022   Morbid obesity (HCC) 01/14/2022   Overweight 01/14/2022   Pure hypercholesterolemia 01/14/2022   Spinal cord disease (HCC) 01/14/2022   Chronic cough 12/01/2021   Diabetes (HCC) 07/08/2021   Glaucoma 07/08/2021   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   Visual field defect of left eye 07/08/2021   Debility 06/16/2021    Hypokalemia    Seizures (HCC)    Benign essential HTN    Controlled type 2 diabetes mellitus with hyperglycemia, without long-term current use of insulin (HCC)    Stridor 06/05/2021   Status post tracheostomy (HCC)    Acute respiratory failure with hypoxia (HCC)    Status epilepticus (HCC)    Brain tumor (HCC) 05/18/2021    PCP: Renford Dills, MD  REFERRING PROVIDER: Donalee Citrin, MD  REFERRING DIAG: LS spinal stenosis with neurogenic claudication  Rationale for Evaluation and Treatment: Rehabilitation  THERAPY DIAG:  Muscle weakness (generalized)  Difficulty in walking, not elsewhere classified  Chronic bilateral low back pain with bilateral sciatica  Unsteadiness on feet  ONSET DATE: chronic  SUBJECTIVE:  SUBJECTIVE STATEMENT: "Legs hurt bad this weekend, Saturday was the worst for me"  PERTINENT HISTORY:  The patient has history of chronic lower back pain, participated in PT over 4 months ago for similar problem. Reports increased Sx and progression now of pain to ant thighs , was B post thighs and LB.  Referred to PT by neurologist  PAIN:  Are you having pain 3/10 Both legs PRECAUTIONS: Fall  RED FLAGS: None   WEIGHT BEARING RESTRICTIONS: No  FALLS:  Has patient fallen in last 6 months? No  LIVING ENVIRONMENT: Lives with: lives alone Lives in: House/apartment Stairs:  ramp Has following equipment at home: Single point cane, Environmental consultant - 2 wheeled, Environmental consultant - 4 wheeled, and Ramped entry  OCCUPATION: works in a church 4 days a week  PLOF: Independent with household mobility with device, Needs assistance with ADLs, and Needs assistance with homemaking  PATIENT GOALS: reduce pain for better standing tolerance  NEXT MD VISIT: unclear  OBJECTIVE:   DIAGNOSTIC FINDINGS:   na  PATIENT SURVEYS:  Modified Oswestry 26/50   SCREENING FOR RED FLAGS: Bowel or bladder incontinence: No Spinal tumors: No Cauda equina syndrome: No Compression fracture: No Abdominal aneurysm: No  COGNITION: Overall cognitive status: Within functional limits for tasks assessed     SENSATION: Light touch intact B LE's today  MUSCLE LENGTH: Hamstrings: Right wfl deg; Left wfl deg Maisie Fus test: Right wfl deg; Left wfl deg  POSTURE:  narrowed stance B, trendelenburg lurch B, with increased sway with gait, increased lumbar lordosis  PALPATION: Non tender with palpation lumbar paraspinals and B post hip musculature  LUMBAR ROM:   AROM eval  Flexion 80%  Extension 50%  Right lateral flexion 50  Left lateral flexion 50  Right rotation   Left rotation    (Blank rows = not tested)  LOWER EXTREMITY ROM:   all LE AAROM WNL B LOWER EXTREMITY MMT:    MMT Right eval Left eval  Hip flexion 3 3-  Hip extension 3+ 3+  Hip abduction 3 3-  Hip adduction    Hip internal rotation    Hip external rotation    Knee flexion    Knee extension 4 4  Ankle dorsiflexion    Ankle plantarflexion 4- 4-  Ankle inversion    Ankle eversion     (Blank rows = not tested)  LUMBAR SPECIAL TESTS:  Straight leg raise test: Negative and FABER test: Negative  FUNCTIONAL TESTS  30 seconds chair stand test 8 reps Timed up and go (TUG): 20.15 2 minute walk test: 90', 1 min 10 sec, stopped due to B thigh pain  GAIT: Distance walked: 69' Assistive device utilized: Single point cane Level of assistance: Modified independence Comments: fatigued quickly, increased trunk sway and decreased hip control with fatigue  TODAY'S TREATMENT:  DATE: 09/12/23 CHECKED Goals  Updated POC Berg STS TUG walk test and oswestry  08/22/23 NuStep L5 x59mins  HS curls 25lb  2x10 Leg Ext 5lb 2x10 Alt 6in box taps form airex w/ SPC 2x10 S2S LE on airex 2x10 Standing on airex row and ext green band 2x10 Side steps over obstacles CGA  08/18/23 NuStep L5 x71mins  Box taps 6"  Calf raises 2x10 Toe raises 2x10 STS holding yellow ball 2x10 LAQ 5# 2x10  HS curls green 25# 2x10 On airex marching using SPC- minA  Standing row and ext green band 2x10  08/15/23 NuStep L5x56mins  Resisted gait 20# 4 way x4 CGA  Alt 4 & 6in box taps x10 each STS on airex 2x10  Side step over WaTE 2x5 each HS curls 25# 2x10 Leg ext 10# 2x10  08/04/23 NuStep L5x27mins  5# seated hip flexion, lateral tap on 4" step  5# standing marches w/SPC  Leg ext 10# 2x10 HS curls 25# 2x10 STS on airex 2x10   07/25/23 NuStep L5 x21mins Resisted gait 20# 4 way x4 CGA  Side steps over obstacles CGA Modified sit ups holding yellow ball 2x10  STS holding yellow ball 2x10 Calf raises 2x12 Toe raises 2x12   07/21/23 Assessed and doc goals for renewal Nustep L 5 20# resisted gait 5 x fwd,5 x backward, 3 x each way BERG 39/56   Standing HHA 5# ankle wts stepping over foam roll 2 sets 5 mod A struggled with LLE. Then lateral step 2 set 5 min A Standing HHA 5# ankle wts    07/11/23 NuStep L5 x80mins  Walking on beam Side steps on airex standing by steps  Step ups 6" with hand rail  STS on airex with red ball chest press 2x10 Calf raises 2x10  Calf stretch 15s x2 Toe raises 2x10 Walking without AD 2 big laps    07/07/23 Goal assessment Nustep L 6 6 min  Obstacle course with SPC stepping on and over objects CGA On foam mat stepping on/off laterally- min A 6 inch alt tap 20 x 2 sets 6 inch side step 15 x each side HS curl 25# 2 sets 10 Knee ext 10# 2 sets 12   07/04/23 NuStep L5x19mins  STS on airex 2x10 Box taps 6" w/CGA  Leg curls 20# 2x10 Leg ext 10# 2x5 Side steps on beam  Walking 2 small laps with SPC, then 2 w/o   06/27/23 NuStep L5 x68mins  STS with ball  2x10 Side steps along mat  Leg ext 5# x10, 10# 2x5 HS curls 20# 2x10 Box taps 6" w/SPC and then w/o- CGA  Walking laps 2 laps with SPC, 1 lap w/o  Physioball flexion stretch x10, x5 rotations    06/23/23 Nustep L 5 walked 200 feet in 90 sec with SPC- SOB then walked without AD 100 feet 43 sec then stopped d/t weakness and increased lateral sway - pt really wants to amb without AD HS curl 20# 2 sets 10 then SL 10# 10 x each Knee ext 8# 2 sets 10 ( 10# was too heavy) then SL 5# 5 x each ( LLE very crepitus with decreased ROM) Resisted Gait 5 x fwd, 5 x backward, 3 x each side 20# CGA with cuing 4 inch alt step tap 20 x  then 6 inch 20x STS with wt ball 10 x   Education details: POC, goals Person educated: Patient Education method: Explanation, Demonstration, Tactile cues, Verbal cues, and Handouts  Education comprehension: verbalized understanding, returned demonstration, verbal cues required, and tactile cues required  HOME EXERCISE PROGRAM: Access Code: [email or web address] URL: [email or web address] Date: 05/16/2023 Prepared by: Amy Speaks  Exercises - Supine Lower Trunk Rotation  - 1 x daily - 3 x weekly - 3 sets - 10 reps - Hooklying Single Knee to Chest  - 1 x daily - 3 x weekly - 3 sets - 10 reps - Supine Bridge  - 1 x daily - 3 x weekly - 3 sets - 10 reps  ASSESSMENT:  CLINICAL IMPRESSION:  Pt missed therapy due to scheduling and transportation issues. She was able to meet her two minute walk test but other goals did reveal some functional limitations such as fictional strength and balance. Pt would benefit from continued skilled therapy to increase functional strength and decrease fall risk.   OBJECTIVE IMPAIRMENTS: decreased activity tolerance, decreased balance, decreased endurance, difficulty walking, decreased strength, impaired perceived functional ability, postural dysfunction, and pain.   ACTIVITY LIMITATIONS: carrying, lifting, bending, standing,  squatting, stairs, transfers, bathing, and locomotion level  PARTICIPATION LIMITATIONS: cleaning and laundry  PERSONAL FACTORS: Age, Behavior pattern, Fitness, Past/current experiences, Time since onset of injury/illness/exacerbation, Transportation, and 3+ comorbidities: h/o brain tumor, visual loss , DM  are also affecting patient's functional outcome.   REHAB POTENTIAL: Good  CLINICAL DECISION MAKING: Evolving/moderate complexity  EVALUATION COMPLEXITY: Moderate   GOALS: Goals reviewed with patient? Yes  SHORT TERM GOALS: Target date: 2 weeks   I HEP Baseline:initiated at eval Goal status: 06/16/23 MET   LONG TERM GOALS: Target date: 07/11/2023  30 sec sit to stand 11 Baseline: 8 Goal status: 06/23/23  progressing 10 x from mat without UE 07/07/23 10 x mat without UE progressing 07/21/23 10x ,11 x in 33.4 sec 09/12/23 ongoing 8 reps in 30 seconds   2.  TUG improve to 14 or less Baseline: 20.15 Goal status:  06/23/23 10.33 sec MET no AD  3.  2 min walk test, 250' or greater Baseline: walked 90 ' in one min and had to stop limited by pain Goal status: 06/23/23 progressing walked 200 feet in 90 sec with SPC- SOB then walked without AD 100 feet 43 sec then stopped d/t weakness and increased lateral sway  10/10 /24 amb 200 feet without AD slight limp on RT 1 min 28 sec progressing 07/21/23 2 big laps in gym 200 feet no AD 1 min 30 sec- decreased control with rt step,lands hard on RT esp as she fatigues. SPC 1 min 10 sec with smoother stride and better control of RT LE.  Met 09/12/23 247ft w/ SPC 2.5 laps around the gym  4.  Modified oswestry 36/50 Baseline: 26/50 Goal status: Ongoing 25/50 09/12/23   5. Patient will improve BERG score to 45/56 or better   Baseline: 39/56  Goal status: 44/56 09/12/23 Progressing     PLAN:  PT FREQUENCY: 2x/week  PT DURATION: 8 weeks  PLANNED INTERVENTIONS: Therapeutic exercises, Therapeutic activity, Neuromuscular re-education,  Balance training, Gait training, Patient/Family education, Self Care, and Joint mobilization.  PLAN FOR NEXT SESSION: Progress her exercises to help with function and independence   Jearld Lesch, PT,  09/12/2023, 11:53 AM [place] [facility] at [facility] [address] [facility]. [place], [place], [address] Phone: [phone]   Fax:  [phone]

## 2023-09-27 ENCOUNTER — Ambulatory Visit
Admission: RE | Admit: 2023-09-27 | Discharge: 2023-09-27 | Disposition: A | Discharge status: Home / Self Care | Payer: Medicare Other | Source: Ambulatory Visit | Attending: Internal Medicine | Admitting: Internal Medicine

## 2023-09-27 DIAGNOSIS — M8580 Other specified disorders of bone density and structure, unspecified site: Secondary | ICD-10-CM

## 2023-10-10 ENCOUNTER — Ambulatory Visit: Payer: Medicare Other | Admitting: Physical Therapy

## 2023-10-13 ENCOUNTER — Ambulatory Visit: Payer: Medicare Other | Attending: Neurosurgery

## 2023-10-13 DIAGNOSIS — R278 Other lack of coordination: Secondary | ICD-10-CM | POA: Diagnosis present

## 2023-10-13 DIAGNOSIS — M5442 Lumbago with sciatica, left side: Secondary | ICD-10-CM | POA: Insufficient documentation

## 2023-10-13 DIAGNOSIS — G8929 Other chronic pain: Secondary | ICD-10-CM | POA: Insufficient documentation

## 2023-10-13 DIAGNOSIS — R2681 Unsteadiness on feet: Secondary | ICD-10-CM | POA: Diagnosis present

## 2023-10-13 DIAGNOSIS — R262 Difficulty in walking, not elsewhere classified: Secondary | ICD-10-CM | POA: Diagnosis present

## 2023-10-13 DIAGNOSIS — M5441 Lumbago with sciatica, right side: Secondary | ICD-10-CM | POA: Diagnosis present

## 2023-10-13 DIAGNOSIS — M6281 Muscle weakness (generalized): Secondary | ICD-10-CM | POA: Insufficient documentation

## 2023-10-13 DIAGNOSIS — M5459 Other low back pain: Secondary | ICD-10-CM | POA: Diagnosis present

## 2023-10-13 NOTE — Therapy (Signed)
OUTPATIENT PHYSICAL THERAPY THORACOLUMBAR TREATMENT   Patient Name: Olivia Shah MRN: [id number] DOB:Nov 16, 1955, 68 y.o., female Today's Date: 10/13/2023  END OF SESSION:  PT End of Session - 10/13/23 1717     Visit Number 19    Date for PT Re-Evaluation 12/01/23    PT Start Time 1715    PT Stop Time 1800    PT Time Calculation (min) 45 min    Activity Tolerance Patient tolerated treatment well    Behavior During Therapy [facility] for tasks assessed/performed                        Past Medical History:  Diagnosis Date   Arthritis    Diabetes mellitus without complication (HCC)    Hypertension    Past Surgical History:  Procedure Laterality Date   APPLICATION OF CRANIAL NAVIGATION N/A 05/18/2021   Procedure: APPLICATION OF CRANIAL NAVIGATION;  Surgeon: Jadene Pierini, MD;  Location: [facility] OR;  Service: Neurosurgery;  Laterality: N/A;   BREAST BIOPSY Right    CHOLECYSTECTOMY     CRANIOTOMY Right 05/18/2021   Procedure: Right craniotomy for tumor resection;  Surgeon: Jadene Pierini, MD;  Location: [facility] OR;  Service: Neurosurgery;  Laterality: Right;   EYE SURGERY     as a child   HERNIA REPAIR     Patient Active Problem List   Diagnosis Date Noted   Abnormal findings on diagnostic imaging of other specified body structures 01/14/2022   Chronic pain 01/14/2022   Colon cancer screening 01/14/2022   Dysphagia 01/14/2022   Family history of malignant neoplasm of digestive organs 01/14/2022   Hyperglycemia due to type 2 diabetes mellitus (HCC) 01/14/2022   Hypertension 01/14/2022   Meralgia paresthetica 01/14/2022   Mixed hyperlipidemia 01/14/2022   Morbid obesity (HCC) 01/14/2022   Overweight 01/14/2022   Pure hypercholesterolemia 01/14/2022   Spinal cord disease (HCC) 01/14/2022   Chronic cough 12/01/2021   Diabetes (HCC) 07/08/2021   Glaucoma 07/08/2021   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   Visual field defect of left eye 07/08/2021   Debility 06/16/2021    Hypokalemia    Seizures (HCC)    Benign essential HTN    Controlled type 2 diabetes mellitus with hyperglycemia, without long-term current use of insulin (HCC)    Stridor 06/05/2021   Status post tracheostomy (HCC)    Acute respiratory failure with hypoxia (HCC)    Status epilepticus (HCC)    Brain tumor (HCC) 05/18/2021    PCP: Renford Dills, MD  REFERRING PROVIDER: Donalee Citrin, MD  REFERRING DIAG: LS spinal stenosis with neurogenic claudication  Rationale for Evaluation and Treatment: Rehabilitation  THERAPY DIAG:  Muscle weakness (generalized)  Difficulty in walking, not elsewhere classified  Chronic bilateral low back pain with bilateral sciatica  Unsteadiness on feet  Other low back pain  Other lack of coordination  ONSET DATE: chronic  SUBJECTIVE:  SUBJECTIVE STATEMENT: My right leg is hurting   PERTINENT HISTORY:  The patient has history of chronic lower back pain, participated in PT over 4 months ago for similar problem. Reports increased Sx and progression now of pain to ant thighs , was B post thighs and LB.  Referred to PT by neurologist  PAIN:  Are you having pain 3/10 Both legs PRECAUTIONS: Fall  RED FLAGS: None   WEIGHT BEARING RESTRICTIONS: No  FALLS:  Has patient fallen in last 6 months? No  LIVING ENVIRONMENT: Lives with: lives alone Lives in: House/apartment Stairs:  ramp Has following equipment at home: Single point cane, Environmental consultant - 2 wheeled, Environmental consultant - 4 wheeled, and Ramped entry  OCCUPATION: works in a church 4 days a week  PLOF: Independent with household mobility with device, Needs assistance with ADLs, and Needs assistance with homemaking  PATIENT GOALS: reduce pain for better standing tolerance  NEXT MD VISIT: unclear  OBJECTIVE:   DIAGNOSTIC  FINDINGS:  na  PATIENT SURVEYS:  Modified Oswestry 26/50   SCREENING FOR RED FLAGS: Bowel or bladder incontinence: No Spinal tumors: No Cauda equina syndrome: No Compression fracture: No Abdominal aneurysm: No  COGNITION: Overall cognitive status: Within functional limits for tasks assessed     SENSATION: Light touch intact B LE's today  MUSCLE LENGTH: Hamstrings: Right wfl deg; Left wfl deg Maisie Fus test: Right wfl deg; Left wfl deg  POSTURE:  narrowed stance B, trendelenburg lurch B, with increased sway with gait, increased lumbar lordosis  PALPATION: Non tender with palpation lumbar paraspinals and B post hip musculature  LUMBAR ROM:   AROM eval  Flexion 80%  Extension 50%  Right lateral flexion 50  Left lateral flexion 50  Right rotation   Left rotation    (Blank rows = not tested)  LOWER EXTREMITY ROM:   all LE AAROM WNL B LOWER EXTREMITY MMT:    MMT Right eval Left eval  Hip flexion 3 3-  Hip extension 3+ 3+  Hip abduction 3 3-  Hip adduction    Hip internal rotation    Hip external rotation    Knee flexion    Knee extension 4 4  Ankle dorsiflexion    Ankle plantarflexion 4- 4-  Ankle inversion    Ankle eversion     (Blank rows = not tested)  LUMBAR SPECIAL TESTS:  Straight leg raise test: Negative and FABER test: Negative  FUNCTIONAL TESTS  30 seconds chair stand test 8 reps Timed up and go (TUG): 20.15 2 minute walk test: 90', 1 min 10 sec, stopped due to B thigh pain  GAIT: Distance walked: 63' Assistive device utilized: Single point cane Level of assistance: Modified independence Comments: fatigued quickly, increased trunk sway and decreased hip control with fatigue  TODAY'S TREATMENT:  DATE: 10/13/23 Recheck goals after one month of not being here  -STS -BERG -TUG  NuStep L5x72mins  HS curls 25lb 2x10 Leg  Ext 5lb 2x10 Walking laps without AD 2 big laps    09/12/23 CHECKED Goals  Updated POC Berg STS TUG walk test and oswestry  08/22/23 NuStep L5 x65mins  HS curls 25lb 2x10 Leg Ext 5lb 2x10 Alt 6in box taps form airex w/ SPC 2x10 S2S LE on airex 2x10 Standing on airex row and ext green band 2x10 Side steps over obstacles CGA  08/18/23 NuStep L5 x52mins  Box taps 6"  Calf raises 2x10 Toe raises 2x10 STS holding yellow ball 2x10 LAQ 5# 2x10  HS curls green 25# 2x10 On airex marching using SPC- minA  Standing row and ext green band 2x10  08/15/23 NuStep L5x49mins  Resisted gait 20# 4 way x4 CGA  Alt 4 & 6in box taps x10 each STS on airex 2x10  Side step over WaTE 2x5 each HS curls 25# 2x10 Leg ext 10# 2x10  08/04/23 NuStep L5x48mins  5# seated hip flexion, lateral tap on 4" step  5# standing marches w/SPC  Leg ext 10# 2x10 HS curls 25# 2x10 STS on airex 2x10   07/25/23 NuStep L5 x58mins Resisted gait 20# 4 way x4 CGA  Side steps over obstacles CGA Modified sit ups holding yellow ball 2x10  STS holding yellow ball 2x10 Calf raises 2x12 Toe raises 2x12   07/21/23 Assessed and doc goals for renewal Nustep L 5 20# resisted gait 5 x fwd,5 x backward, 3 x each way BERG 39/56   Standing HHA 5# ankle wts stepping over foam roll 2 sets 5 mod A struggled with LLE. Then lateral step 2 set 5 min A Standing HHA 5# ankle wts    07/11/23 NuStep L5 x38mins  Walking on beam Side steps on airex standing by steps  Step ups 6" with hand rail  STS on airex with red ball chest press 2x10 Calf raises 2x10  Calf stretch 15s x2 Toe raises 2x10 Walking without AD 2 big laps    07/07/23 Goal assessment Nustep L 6 6 min  Obstacle course with SPC stepping on and over objects CGA On foam mat stepping on/off laterally- min A 6 inch alt tap 20 x 2 sets 6 inch side step 15 x each side HS curl 25# 2 sets 10 Knee ext 10# 2 sets 12   07/04/23 NuStep L5x29mins  STS  on airex 2x10 Box taps 6" w/CGA  Leg curls 20# 2x10 Leg ext 10# 2x5 Side steps on beam  Walking 2 small laps with SPC, then 2 w/o   06/27/23 NuStep L5 x56mins  STS with ball 2x10 Side steps along mat  Leg ext 5# x10, 10# 2x5 HS curls 20# 2x10 Box taps 6" w/SPC and then w/o- CGA  Walking laps 2 laps with SPC, 1 lap w/o  Physioball flexion stretch x10, x5 rotations    06/23/23 Nustep L 5 walked 200 feet in 90 sec with SPC- SOB then walked without AD 100 feet 43 sec then stopped d/t weakness and increased lateral sway - pt really wants to amb without AD HS curl 20# 2 sets 10 then SL 10# 10 x each Knee ext 8# 2 sets 10 ( 10# was too heavy) then SL 5# 5 x each ( LLE very crepitus with decreased ROM) Resisted Gait 5 x fwd, 5 x backward, 3 x each side 20# CGA with cuing 4  inch alt step tap 20 x  then 6 inch 20x STS with wt ball 10 x   Education details: POC, goals Person educated: Patient Education method: Explanation, Demonstration, Tactile cues, Verbal cues, and Handouts Education comprehension: verbalized understanding, returned demonstration, verbal cues required, and tactile cues required  HOME EXERCISE PROGRAM: Access Code: [email or web address] URL: [email or web address] Date: 05/16/2023 Prepared by: Amy Speaks  Exercises - Supine Lower Trunk Rotation  - 1 x daily - 3 x weekly - 3 sets - 10 reps - Hooklying Single Knee to Chest  - 1 x daily - 3 x weekly - 3 sets - 10 reps - Supine Bridge  - 1 x daily - 3 x weekly - 3 sets - 10 reps  ASSESSMENT:  CLINICAL IMPRESSION:  Pt returns to therapy after one month off. Revised her goals as she was able to meet most of them today. She would like to add a goal to walk "more straight". Today she is walking with more of a limp. She reports today is the first time her R knee hurts, usually the pain is in her thighs. Pt would benefit from continued skilled therapy to increase functional strength and decrease fall risk.   OBJECTIVE  IMPAIRMENTS: decreased activity tolerance, decreased balance, decreased endurance, difficulty walking, decreased strength, impaired perceived functional ability, postural dysfunction, and pain.   ACTIVITY LIMITATIONS: carrying, lifting, bending, standing, squatting, stairs, transfers, bathing, and locomotion level  PARTICIPATION LIMITATIONS: cleaning and laundry  PERSONAL FACTORS: Age, Behavior pattern, Fitness, Past/current experiences, Time since onset of injury/illness/exacerbation, Transportation, and 3+ comorbidities: h/o brain tumor, visual loss , DM  are also affecting patient's functional outcome.   REHAB POTENTIAL: Good  CLINICAL DECISION MAKING: Evolving/moderate complexity  EVALUATION COMPLEXITY: Moderate   GOALS: Goals reviewed with patient? Yes  SHORT TERM GOALS: Target date: 2 weeks   I HEP Baseline:initiated at eval Goal status: 06/16/23 MET   LONG TERM GOALS: Target date: 12/01/23  30 sec sit to stand 11 Baseline: 8 Goal status: 06/23/23  progressing 10 x from mat without UE 07/07/23 10 x mat without UE progressing 07/21/23 10x ,11 x in 33.4 sec 09/12/23 ongoing 8 reps in 30 seconds  10/13/23-- 11 reps MET  2.  TUG improve to 14 or less Baseline: 20.15 Goal status:  06/23/23 10.33 sec MET no AD  3.  2 min walk test, 250' or greater Baseline: walked 90 ' in one min and had to stop limited by pain Goal status: 06/23/23 progressing walked 200 feet in 90 sec with SPC- SOB then walked without AD 100 feet 43 sec then stopped d/t weakness and increased lateral sway  10/10 /24 amb 200 feet without AD slight limp on RT 1 min 28 sec progressing 07/21/23 2 big laps in gym 200 feet no AD 1 min 30 sec- decreased control with rt step,lands hard on RT esp as she fatigues. SPC 1 min 10 sec with smoother stride and better control of RT LE.  Met 09/12/23 250ft w/ SPC 2.5 laps around the gym  4.  Modified oswestry 36/50 Baseline: 26/50 Goal status: Ongoing 25/50 09/12/23, 29/50  ongoing 10/13/23  5. Patient will improve BERG score to 45/56 or better   Baseline: 39/56  Goal status: 44/56 09/12/23 Progressing   10/13/23- MET 46/56  6. Patient will demonstrate improved gait and be able to walk 138ft with decreased limp and no cane  Baseline: "I want to walk straighter but I feel like I lean forward because of pain"  Goal  status: INITIAL    PLAN:  PT FREQUENCY: 2x/week  PT DURATION: 8 weeks  PLANNED INTERVENTIONS: Therapeutic exercises, Therapeutic activity, Neuromuscular re-education, Balance training, Gait training, Patient/Family education, Self Care, and Joint mobilization.  PLAN FOR NEXT SESSION: Progress her exercises to help with function and independence   Cassie Freer, PT,  10/13/2023, 5:55 PM [place] [facility] at [facility] [address] [facility]. [place], [place], [address] Phone: [phone]   Fax:  [phone]

## 2023-10-14 NOTE — Therapy (Signed)
OUTPATIENT PHYSICAL THERAPY THORACOLUMBAR TREATMENT Progress Note Reporting Period 07/10/24 to 10/17/23  See note below for Objective Data and Assessment of Progress/Goals.      Patient Name: Olivia Shah MRN: [id number] DOB:09/27/1956, 68 y.o., female Today's Date: 10/17/2023  END OF SESSION:  PT End of Session - 10/17/23 1014     Visit Number 20    Date for PT Re-Evaluation 12/01/23    PT Start Time 1015    PT Stop Time 1100    PT Time Calculation (min) 45 min    Activity Tolerance Patient tolerated treatment well    Behavior During Therapy [facility] for tasks assessed/performed                         Past Medical History:  Diagnosis Date   Arthritis    Diabetes mellitus without complication (HCC)    Hypertension    Past Surgical History:  Procedure Laterality Date   APPLICATION OF CRANIAL NAVIGATION N/A 05/18/2021   Procedure: APPLICATION OF CRANIAL NAVIGATION;  Surgeon: Jadene Pierini, MD;  Location: [facility] OR;  Service: Neurosurgery;  Laterality: N/A;   BREAST BIOPSY Right    CHOLECYSTECTOMY     CRANIOTOMY Right 05/18/2021   Procedure: Right craniotomy for tumor resection;  Surgeon: Jadene Pierini, MD;  Location: [facility] OR;  Service: Neurosurgery;  Laterality: Right;   EYE SURGERY     as a child   HERNIA REPAIR     Patient Active Problem List   Diagnosis Date Noted   Abnormal findings on diagnostic imaging of other specified body structures 01/14/2022   Chronic pain 01/14/2022   Colon cancer screening 01/14/2022   Dysphagia 01/14/2022   Family history of malignant neoplasm of digestive organs 01/14/2022   Hyperglycemia due to type 2 diabetes mellitus (HCC) 01/14/2022   Hypertension 01/14/2022   Meralgia paresthetica 01/14/2022   Mixed hyperlipidemia 01/14/2022   Morbid obesity (HCC) 01/14/2022   Overweight 01/14/2022   Pure hypercholesterolemia 01/14/2022   Spinal cord disease (HCC) 01/14/2022   Chronic cough 12/01/2021   Diabetes (HCC)  07/08/2021   Glaucoma 07/08/2021   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   Visual field defect of left eye 07/08/2021   Debility 06/16/2021   Hypokalemia    Seizures (HCC)    Benign essential HTN    Controlled type 2 diabetes mellitus with hyperglycemia, without long-term current use of insulin (HCC)    Stridor 06/05/2021   Status post tracheostomy (HCC)    Acute respiratory failure with hypoxia (HCC)    Status epilepticus (HCC)    Brain tumor (HCC) 05/18/2021    PCP: Renford Dills, MD  REFERRING PROVIDER: Donalee Citrin, MD  REFERRING DIAG: LS spinal stenosis with neurogenic claudication  Rationale for Evaluation and Treatment: Rehabilitation  THERAPY DIAG:  Muscle weakness (generalized)  Difficulty in walking, not elsewhere classified  Chronic bilateral low back pain with bilateral sciatica  Unsteadiness on feet  Other low back pain  ONSET DATE: chronic  SUBJECTIVE:  SUBJECTIVE STATEMENT: My right leg is hurting today, the knee is okay.   PERTINENT HISTORY:  The patient has history of chronic lower back pain, participated in PT over 4 months ago for similar problem. Reports increased Sx and progression now of pain to ant thighs , was B post thighs and LB.  Referred to PT by neurologist  PAIN:  Are you having pain 3/10 Both legs PRECAUTIONS: Fall  RED FLAGS: None   WEIGHT BEARING RESTRICTIONS: No  FALLS:  Has patient fallen in last 6 months? No  LIVING ENVIRONMENT: Lives with: lives alone Lives in: House/apartment Stairs:  ramp Has following equipment at home: Single point cane, Environmental consultant - 2 wheeled, Environmental consultant - 4 wheeled, and Ramped entry  OCCUPATION: works in a church 4 days a week  PLOF: Independent with household mobility with device, Needs assistance with ADLs, and Needs assistance  with homemaking  PATIENT GOALS: reduce pain for better standing tolerance  NEXT MD VISIT: unclear  OBJECTIVE:   DIAGNOSTIC FINDINGS:  na  PATIENT SURVEYS:  Modified Oswestry 26/50   SCREENING FOR RED FLAGS: Bowel or bladder incontinence: No Spinal tumors: No Cauda equina syndrome: No Compression fracture: No Abdominal aneurysm: No  COGNITION: Overall cognitive status: Within functional limits for tasks assessed     SENSATION: Light touch intact B LE's today  MUSCLE LENGTH: Hamstrings: Right wfl deg; Left wfl deg Maisie Fus test: Right wfl deg; Left wfl deg  POSTURE:  narrowed stance B, trendelenburg lurch B, with increased sway with gait, increased lumbar lordosis  PALPATION: Non tender with palpation lumbar paraspinals and B post hip musculature  LUMBAR ROM:   AROM eval  Flexion 80%  Extension 50%  Right lateral flexion 50  Left lateral flexion 50  Right rotation   Left rotation    (Blank rows = not tested)  LOWER EXTREMITY ROM:   all LE AAROM WNL B LOWER EXTREMITY MMT:    MMT Right eval Left eval  Hip flexion 3 3-  Hip extension 3+ 3+  Hip abduction 3 3-  Hip adduction    Hip internal rotation    Hip external rotation    Knee flexion    Knee extension 4 4  Ankle dorsiflexion    Ankle plantarflexion 4- 4-  Ankle inversion    Ankle eversion     (Blank rows = not tested)  LUMBAR SPECIAL TESTS:  Straight leg raise test: Negative and FABER test: Negative  FUNCTIONAL TESTS  30 seconds chair stand test 8 reps Timed up and go (TUG): 20.15 2 minute walk test: 90', 1 min 10 sec, stopped due to B thigh pain  GAIT: Distance walked: 16' Assistive device utilized: Single point cane Level of assistance: Modified independence Comments: fatigued quickly, increased trunk sway and decreased hip control with fatigue  TODAY'S TREATMENT:  DATE: 10/17/23 STS x10, then with chest press 3# x10  Side steps over bands on floor  4 way step overs NuStep L5x60mins  Box taps 6" Calf raises 2x10  Standing march 5# w/SPC  10/13/23 Recheck goals after one month of not being here  -STS -BERG -TUG  NuStep L5x33mins  HS curls 25lb 2x10 Leg Ext 5lb 2x10 Walking laps without AD 2 big laps    09/12/23 CHECKED Goals  Updated POC Berg STS TUG walk test and oswestry  08/22/23 NuStep L5 x56mins  HS curls 25lb 2x10 Leg Ext 5lb 2x10 Alt 6in box taps form airex w/ SPC 2x10 S2S LE on airex 2x10 Standing on airex row and ext green band 2x10 Side steps over obstacles CGA  08/18/23 NuStep L5 x59mins  Box taps 6"  Calf raises 2x10 Toe raises 2x10 STS holding yellow ball 2x10 LAQ 5# 2x10  HS curls green 25# 2x10 On airex marching using SPC- minA  Standing row and ext green band 2x10  08/15/23 NuStep L5x71mins  Resisted gait 20# 4 way x4 CGA  Alt 4 & 6in box taps x10 each STS on airex 2x10  Side step over WaTE 2x5 each HS curls 25# 2x10 Leg ext 10# 2x10  08/04/23 NuStep L5x45mins  5# seated hip flexion, lateral tap on 4" step  5# standing marches w/SPC  Leg ext 10# 2x10 HS curls 25# 2x10 STS on airex 2x10   07/25/23 NuStep L5 x81mins Resisted gait 20# 4 way x4 CGA  Side steps over obstacles CGA Modified sit ups holding yellow ball 2x10  STS holding yellow ball 2x10 Calf raises 2x12 Toe raises 2x12   07/21/23 Assessed and doc goals for renewal Nustep L 5 20# resisted gait 5 x fwd,5 x backward, 3 x each way BERG 39/56   Standing HHA 5# ankle wts stepping over foam roll 2 sets 5 mod A struggled with LLE. Then lateral step 2 set 5 min A Standing HHA 5# ankle wts    07/11/23 NuStep L5 x52mins  Walking on beam Side steps on airex standing by steps  Step ups 6" with hand rail  STS on airex with red ball chest press 2x10 Calf raises 2x10  Calf stretch 15s x2 Toe raises 2x10 Walking without AD 2  big laps    07/07/23 Goal assessment Nustep L 6 6 min  Obstacle course with SPC stepping on and over objects CGA On foam mat stepping on/off laterally- min A 6 inch alt tap 20 x 2 sets 6 inch side step 15 x each side HS curl 25# 2 sets 10 Knee ext 10# 2 sets 12   07/04/23 NuStep L5x40mins  STS on airex 2x10 Box taps 6" w/CGA  Leg curls 20# 2x10 Leg ext 10# 2x5 Side steps on beam  Walking 2 small laps with SPC, then 2 w/o   06/27/23 NuStep L5 x64mins  STS with ball 2x10 Side steps along mat  Leg ext 5# x10, 10# 2x5 HS curls 20# 2x10 Box taps 6" w/SPC and then w/o- CGA  Walking laps 2 laps with SPC, 1 lap w/o  Physioball flexion stretch x10, x5 rotations    06/23/23 Nustep L 5 walked 200 feet in 90 sec with SPC- SOB then walked without AD 100 feet 43 sec then stopped d/t weakness and increased lateral sway - pt really wants to amb without AD HS curl 20# 2 sets 10 then SL 10# 10 x each Knee ext 8# 2 sets 10 (  10# was too heavy) then SL 5# 5 x each ( LLE very crepitus with decreased ROM) Resisted Gait 5 x fwd, 5 x backward, 3 x each side 20# CGA with cuing 4 inch alt step tap 20 x  then 6 inch 20x STS with wt ball 10 x   Education details: POC, goals Person educated: Patient Education method: Explanation, Demonstration, Tactile cues, Verbal cues, and Handouts Education comprehension: verbalized understanding, returned demonstration, verbal cues required, and tactile cues required  HOME EXERCISE PROGRAM: Access Code: [email or web address] URL: [email or web address] Date: 05/16/2023 Prepared by: Amy Speaks  Exercises - Supine Lower Trunk Rotation  - 1 x daily - 3 x weekly - 3 sets - 10 reps - Hooklying Single Knee to Chest  - 1 x daily - 3 x weekly - 3 sets - 10 reps - Supine Bridge  - 1 x daily - 3 x weekly - 3 sets - 10 reps  ASSESSMENT:  CLINICAL IMPRESSION:  Pt returns with ongoing pain in her R thigh. Adjusted her cane because it was too tall and advised  her to use on the opposite side of pain. Today we continued to work on strength and balance. Has some fear with backward steps. Decreased knee and hip flexion with standing marches when having to weight bear on RLE, same with box taps. Pt would benefit from continued skilled therapy to increase functional strength and decrease fall risk.   OBJECTIVE IMPAIRMENTS: decreased activity tolerance, decreased balance, decreased endurance, difficulty walking, decreased strength, impaired perceived functional ability, postural dysfunction, and pain.   ACTIVITY LIMITATIONS: carrying, lifting, bending, standing, squatting, stairs, transfers, bathing, and locomotion level  PARTICIPATION LIMITATIONS: cleaning and laundry  PERSONAL FACTORS: Age, Behavior pattern, Fitness, Past/current experiences, Time since onset of injury/illness/exacerbation, Transportation, and 3+ comorbidities: h/o brain tumor, visual loss , DM  are also affecting patient's functional outcome.   REHAB POTENTIAL: Good  CLINICAL DECISION MAKING: Evolving/moderate complexity  EVALUATION COMPLEXITY: Moderate   GOALS: Goals reviewed with patient? Yes  SHORT TERM GOALS: Target date: 2 weeks   I HEP Baseline:initiated at eval Goal status: 06/16/23 MET   LONG TERM GOALS: Target date: 12/01/23  30 sec sit to stand 11 Baseline: 8 Goal status: 06/23/23  progressing 10 x from mat without UE 07/07/23 10 x mat without UE progressing 07/21/23 10x ,11 x in 33.4 sec 09/12/23 ongoing 8 reps in 30 seconds  10/13/23-- 11 reps MET  2.  TUG improve to 14 or less Baseline: 20.15 Goal status:  06/23/23 10.33 sec MET no AD  3.  2 min walk test, 250' or greater Baseline: walked 90 ' in one min and had to stop limited by pain Goal status: 06/23/23 progressing walked 200 feet in 90 sec with SPC- SOB then walked without AD 100 feet 43 sec then stopped d/t weakness and increased lateral sway  10/10 /24 amb 200 feet without AD slight limp on RT 1 min 28  sec progressing 07/21/23 2 big laps in gym 200 feet no AD 1 min 30 sec- decreased control with rt step,lands hard on RT esp as she fatigues. SPC 1 min 10 sec with smoother stride and better control of RT LE.  Met 09/12/23 219ft w/ SPC 2.5 laps around the gym  4.  Modified oswestry 36/50 Baseline: 26/50 Goal status: Ongoing 25/50 09/12/23, 29/50 ongoing 10/13/23  5. Patient will improve BERG score to 45/56 or better   Baseline: 39/56  Goal status: 44/56 09/12/23 Progressing   10/13/23- MET 46/56  6. Patient will demonstrate improved gait and be able to walk 141ft with decreased limp and no cane  Baseline: "I want to walk straighter but I feel like I lean forward because of pain"  Goal status: progressing 10/17/23    PLAN:  PT FREQUENCY: 2x/week  PT DURATION: 8 weeks  PLANNED INTERVENTIONS: Therapeutic exercises, Therapeutic activity, Neuromuscular re-education, Balance training, Gait training, Patient/Family education, Self Care, and Joint mobilization.  PLAN FOR NEXT SESSION: Progress her exercises to help with function and independence   Cassie Freer, PT,  10/17/2023, 10:59 AM [facility] Outpatient Rehabilitation at [facility]. [place], [place], [address] Phone: [phone]   Fax:  [phone]

## 2023-10-17 ENCOUNTER — Ambulatory Visit: Payer: Medicare Other

## 2023-10-17 DIAGNOSIS — G8929 Other chronic pain: Secondary | ICD-10-CM

## 2023-10-17 DIAGNOSIS — M6281 Muscle weakness (generalized): Secondary | ICD-10-CM

## 2023-10-17 DIAGNOSIS — R262 Difficulty in walking, not elsewhere classified: Secondary | ICD-10-CM

## 2023-10-17 DIAGNOSIS — R2681 Unsteadiness on feet: Secondary | ICD-10-CM

## 2023-10-17 DIAGNOSIS — M5459 Other low back pain: Secondary | ICD-10-CM

## 2023-10-24 ENCOUNTER — Encounter: Payer: Self-pay | Admitting: Physical Therapy

## 2023-10-24 ENCOUNTER — Ambulatory Visit: Payer: Medicare Other | Admitting: Physical Therapy

## 2023-10-24 DIAGNOSIS — M6281 Muscle weakness (generalized): Secondary | ICD-10-CM

## 2023-10-24 DIAGNOSIS — M5459 Other low back pain: Secondary | ICD-10-CM

## 2023-10-24 DIAGNOSIS — R2681 Unsteadiness on feet: Secondary | ICD-10-CM

## 2023-10-24 DIAGNOSIS — G8929 Other chronic pain: Secondary | ICD-10-CM

## 2023-10-24 DIAGNOSIS — R262 Difficulty in walking, not elsewhere classified: Secondary | ICD-10-CM

## 2023-10-24 NOTE — Therapy (Signed)
OUTPATIENT PHYSICAL T   Patient Name: Olivia Shah MRN: [id number] DOB:04-27-1956, 68 y.o., female Today's Date: 10/24/2023  END OF SESSION:  PT End of Session - 10/24/23 1143     Visit Number 21    Date for PT Re-Evaluation 12/01/23    PT Start Time 1145    PT Stop Time 1230    PT Time Calculation (min) 45 min    Activity Tolerance Patient tolerated treatment well    Behavior During Therapy [facility] for tasks assessed/performed                         Past Medical History:  Diagnosis Date   Arthritis    Diabetes mellitus without complication (HCC)    Hypertension    Past Surgical History:  Procedure Laterality Date   APPLICATION OF CRANIAL NAVIGATION N/A 05/18/2021   Procedure: APPLICATION OF CRANIAL NAVIGATION;  Surgeon: Jadene Pierini, MD;  Location: [facility] OR;  Service: Neurosurgery;  Laterality: N/A;   BREAST BIOPSY Right    CHOLECYSTECTOMY     CRANIOTOMY Right 05/18/2021   Procedure: Right craniotomy for tumor resection;  Surgeon: Jadene Pierini, MD;  Location: [facility] OR;  Service: Neurosurgery;  Laterality: Right;   EYE SURGERY     as a child   HERNIA REPAIR     Patient Active Problem List   Diagnosis Date Noted   Abnormal findings on diagnostic imaging of other specified body structures 01/14/2022   Chronic pain 01/14/2022   Colon cancer screening 01/14/2022   Dysphagia 01/14/2022   Family history of malignant neoplasm of digestive organs 01/14/2022   Hyperglycemia due to type 2 diabetes mellitus (HCC) 01/14/2022   Hypertension 01/14/2022   Meralgia paresthetica 01/14/2022   Mixed hyperlipidemia 01/14/2022   Morbid obesity (HCC) 01/14/2022   Overweight 01/14/2022   Pure hypercholesterolemia 01/14/2022   Spinal cord disease (HCC) 01/14/2022   Chronic cough 12/01/2021   Diabetes (HCC) 07/08/2021   Glaucoma 07/08/2021   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   Visual field defect of left eye 07/08/2021   Debility 06/16/2021   Hypokalemia    Seizures  (HCC)    Benign essential HTN    Controlled type 2 diabetes mellitus with hyperglycemia, without long-term current use of insulin (HCC)    Stridor 06/05/2021   Status post tracheostomy (HCC)    Acute respiratory failure with hypoxia (HCC)    Status epilepticus (HCC)    Brain tumor (HCC) 05/18/2021    PCP: Renford Dills, MD  REFERRING PROVIDER: Donalee Citrin, MD  REFERRING DIAG: LS spinal stenosis with neurogenic claudication  Rationale for Evaluation and Treatment: Rehabilitation  THERAPY DIAG:  Muscle weakness (generalized)  Difficulty in walking, not elsewhere classified  Unsteadiness on feet  Chronic bilateral low back pain with bilateral sciatica  Other low back pain  ONSET DATE: chronic  SUBJECTIVE:  SUBJECTIVE STATEMENT: "I feel ok, except for this leg" Pt points to her R thigh  PERTINENT HISTORY:  The patient has history of chronic lower back pain, participated in PT over 4 months ago for similar problem. Reports increased Sx and progression now of pain to ant thighs , was B post thighs and LB.  Referred to PT by neurologist  PAIN:  Are you having pain 3/10 Both legs PRECAUTIONS: Fall  RED FLAGS: None   WEIGHT BEARING RESTRICTIONS: No  FALLS:  Has patient fallen in last 6 months? No  LIVING ENVIRONMENT: Lives with: lives alone Lives in: House/apartment Stairs:  ramp Has following equipment at home: Single point cane, Environmental consultant - 2 wheeled, Environmental consultant - 4 wheeled, and Ramped entry  OCCUPATION: works in a church 4 days a week  PLOF: Independent with household mobility with device, Needs assistance with ADLs, and Needs assistance with homemaking  PATIENT GOALS: reduce pain for better standing tolerance  NEXT MD VISIT: unclear  OBJECTIVE:   DIAGNOSTIC FINDINGS:   na  PATIENT SURVEYS:  Modified Oswestry 26/50   SCREENING FOR RED FLAGS: Bowel or bladder incontinence: No Spinal tumors: No Cauda equina syndrome: No Compression fracture: No Abdominal aneurysm: No  COGNITION: Overall cognitive status: Within functional limits for tasks assessed     SENSATION: Light touch intact B LE's today  MUSCLE LENGTH: Hamstrings: Right wfl deg; Left wfl deg Maisie Fus test: Right wfl deg; Left wfl deg  POSTURE:  narrowed stance B, trendelenburg lurch B, with increased sway with gait, increased lumbar lordosis  PALPATION: Non tender with palpation lumbar paraspinals and B post hip musculature  LUMBAR ROM:   AROM eval  Flexion 80%  Extension 50%  Right lateral flexion 50  Left lateral flexion 50  Right rotation   Left rotation    (Blank rows = not tested)  LOWER EXTREMITY ROM:   all LE AAROM WNL B LOWER EXTREMITY MMT:    MMT Right eval Left eval  Hip flexion 3 3-  Hip extension 3+ 3+  Hip abduction 3 3-  Hip adduction    Hip internal rotation    Hip external rotation    Knee flexion    Knee extension 4 4  Ankle dorsiflexion    Ankle plantarflexion 4- 4-  Ankle inversion    Ankle eversion     (Blank rows = not tested)  LUMBAR SPECIAL TESTS:  Straight leg raise test: Negative and FABER test: Negative  FUNCTIONAL TESTS  30 seconds chair stand test 8 reps Timed up and go (TUG): 20.15 2 minute walk test: 90', 1 min 10 sec, stopped due to B thigh pain  GAIT: Distance walked: 11' Assistive device utilized: Single point cane Level of assistance: Modified independence Comments: fatigued quickly, increased trunk sway and decreased hip control with fatigue  TODAY'S TREATMENT:  DATE: 10/24/23 NuStep L5 x68mins  S2S w/ chest press yellow ball 2x10 Alt 4in box taps, then 6in x 10 each  Alt 6in box taps from airex  2x5 Sides steps over objects  Calf raises 2x10  Shoulder Ext 5lb 2x10 Seated rows blue 2x15 Seated hip abduction bloe 2x15 10/17/23 STS x10, then with chest press 3# x10  Side steps over bands on floor  4 way step overs NuStep L5x16mins  Box taps 6" Calf raises 2x10  Standing march 5# w/SPC  10/13/23 Recheck goals after one month of not being here  -STS -BERG -TUG  NuStep L5x35mins  HS curls 25lb 2x10 Leg Ext 5lb 2x10 Walking laps without AD 2 big laps    09/12/23 CHECKED Goals  Updated POC Berg STS TUG walk test and oswestry  08/22/23 NuStep L5 x95mins  HS curls 25lb 2x10 Leg Ext 5lb 2x10 Alt 6in box taps form airex w/ SPC 2x10 S2S LE on airex 2x10 Standing on airex row and ext green band 2x10 Side steps over obstacles CGA  08/18/23 NuStep L5 x41mins  Box taps 6"  Calf raises 2x10 Toe raises 2x10 STS holding yellow ball 2x10 LAQ 5# 2x10  HS curls green 25# 2x10 On airex marching using SPC- minA  Standing row and ext green band 2x10  08/15/23 NuStep L5x60mins  Resisted gait 20# 4 way x4 CGA  Alt 4 & 6in box taps x10 each STS on airex 2x10  Side step over WaTE 2x5 each HS curls 25# 2x10 Leg ext 10# 2x10  08/04/23 NuStep L5x65mins  5# seated hip flexion, lateral tap on 4" step  5# standing marches w/SPC  Leg ext 10# 2x10 HS curls 25# 2x10 STS on airex 2x10   07/25/23 NuStep L5 x68mins Resisted gait 20# 4 way x4 CGA  Side steps over obstacles CGA Modified sit ups holding yellow ball 2x10  STS holding yellow ball 2x10 Calf raises 2x12 Toe raises 2x12   07/21/23 Assessed and doc goals for renewal Nustep L 5 20# resisted gait 5 x fwd,5 x backward, 3 x each way BERG 39/56   Standing HHA 5# ankle wts stepping over foam roll 2 sets 5 mod A struggled with LLE. Then lateral step 2 set 5 min A Standing HHA 5# ankle wts    07/11/23 NuStep L5 x2mins  Walking on beam Side steps on airex standing by steps  Step ups 6" with hand rail  STS  on airex with red ball chest press 2x10 Calf raises 2x10  Calf stretch 15s x2 Toe raises 2x10 Walking without AD 2 big laps       Education details: POC, goals Person educated: Patient Education method: Explanation, Demonstration, Tactile cues, Verbal cues, and Handouts Education comprehension: verbalized understanding, returned demonstration, verbal cues required, and tactile cues required  HOME EXERCISE PROGRAM: Access Code: [email or web address] URL: [email or web address] Date: 05/16/2023 Prepared by: Amy Speaks  Exercises - Supine Lower Trunk Rotation  - 1 x daily - 3 x weekly - 3 sets - 10 reps - Hooklying Single Knee to Chest  - 1 x daily - 3 x weekly - 3 sets - 10 reps - Supine Bridge  - 1 x daily - 3 x weekly - 3 sets - 10 reps  ASSESSMENT:  CLINICAL IMPRESSION:  Pt returns with ongoing pain in her R thigh. Today we continued to work on strength and balance. Has some fear with alt box taps from airex. Increase time needed today with sit to stands  due to fatigue and weakness.  Postural weakness present with standing shoulder Ext. Pt would benefit from continued skilled therapy to increase functional strength and decrease fall risk   OBJECTIVE IMPAIRMENTS: decreased activity tolerance, decreased balance, decreased endurance, difficulty walking, decreased strength, impaired perceived functional ability, postural dysfunction, and pain.   ACTIVITY LIMITATIONS: carrying, lifting, bending, standing, squatting, stairs, transfers, bathing, and locomotion level  PARTICIPATION LIMITATIONS: cleaning and laundry  PERSONAL FACTORS: Age, Behavior pattern, Fitness, Past/current experiences, Time since onset of injury/illness/exacerbation, Transportation, and 3+ comorbidities: h/o brain tumor, visual loss , DM  are also affecting patient's functional outcome.   REHAB POTENTIAL: Good  CLINICAL DECISION MAKING: Evolving/moderate complexity  EVALUATION COMPLEXITY:  Moderate   GOALS: Goals reviewed with patient? Yes  SHORT TERM GOALS: Target date: 2 weeks   I HEP Baseline:initiated at eval Goal status: 06/16/23 MET   LONG TERM GOALS: Target date: 12/01/23  30 sec sit to stand 11 Baseline: 8 Goal status: 06/23/23  progressing 10 x from mat without UE 07/07/23 10 x mat without UE progressing 07/21/23 10x ,11 x in 33.4 sec 09/12/23 ongoing 8 reps in 30 seconds  10/13/23-- 11 reps MET  2.  TUG improve to 14 or less Baseline: 20.15 Goal status:  06/23/23 10.33 sec MET no AD  3.  2 min walk test, 250' or greater Baseline: walked 90 ' in one min and had to stop limited by pain Goal status: 06/23/23 progressing walked 200 feet in 90 sec with SPC- SOB then walked without AD 100 feet 43 sec then stopped d/t weakness and increased lateral sway  10/10 /24 amb 200 feet without AD slight limp on RT 1 min 28 sec progressing 07/21/23 2 big laps in gym 200 feet no AD 1 min 30 sec- decreased control with rt step,lands hard on RT esp as she fatigues. SPC 1 min 10 sec with smoother stride and better control of RT LE.  Met 09/12/23 272ft w/ SPC 2.5 laps around the gym  4.  Modified oswestry 36/50 Baseline: 26/50 Goal status: Ongoing 25/50 09/12/23, 29/50 ongoing 10/13/23  5. Patient will improve BERG score to 45/56 or better   Baseline: 39/56  Goal status: 44/56 09/12/23 Progressing   10/13/23- MET 46/56  6. Patient will demonstrate improved gait and be able to walk 184ft with decreased limp and no cane  Baseline: "I want to walk straighter but I feel like I lean forward because of pain"  Goal status: progressing 10/17/23    PLAN:  PT FREQUENCY: 2x/week  PT DURATION: 8 weeks  PLANNED INTERVENTIONS: Therapeutic exercises, Therapeutic activity, Neuromuscular re-education, Balance training, Gait training, Patient/Family education, Self Care, and Joint mobilization.  PLAN FOR NEXT SESSION: Progress her exercises to help with function and  independence   Grayce Sessions, PTA,  10/24/2023, 11:44 AM   Outpatient Rehabilitation at [facility]. [place], [place], [address] Phone: [phone]   Fax:  [phone]

## 2023-10-27 ENCOUNTER — Ambulatory Visit: Payer: Medicare Other

## 2023-10-27 DIAGNOSIS — R262 Difficulty in walking, not elsewhere classified: Secondary | ICD-10-CM

## 2023-10-27 DIAGNOSIS — M6281 Muscle weakness (generalized): Secondary | ICD-10-CM

## 2023-10-27 DIAGNOSIS — G8929 Other chronic pain: Secondary | ICD-10-CM

## 2023-10-27 DIAGNOSIS — R2681 Unsteadiness on feet: Secondary | ICD-10-CM

## 2023-10-27 NOTE — Therapy (Signed)
OUTPATIENT PHYSICAL TREATMENT  Patient Name: Olivia Shah MRN: [id number] DOB:1956-03-12, 68 y.o., female Today's Date: 10/27/2023  END OF SESSION:  PT End of Session - 10/27/23 1659     Visit Number 22    Date for PT Re-Evaluation 12/01/23    PT Start Time 1700    PT Stop Time 1745    PT Time Calculation (min) 45 min    Activity Tolerance Patient tolerated treatment well    Behavior During Therapy [facility] for tasks assessed/performed                          Past Medical History:  Diagnosis Date   Arthritis    Diabetes mellitus without complication (HCC)    Hypertension    Past Surgical History:  Procedure Laterality Date   APPLICATION OF CRANIAL NAVIGATION N/A 05/18/2021   Procedure: APPLICATION OF CRANIAL NAVIGATION;  Surgeon: Jadene Pierini, MD;  Location: [facility] OR;  Service: Neurosurgery;  Laterality: N/A;   BREAST BIOPSY Right    CHOLECYSTECTOMY     CRANIOTOMY Right 05/18/2021   Procedure: Right craniotomy for tumor resection;  Surgeon: Jadene Pierini, MD;  Location: [facility] OR;  Service: Neurosurgery;  Laterality: Right;   EYE SURGERY     as a child   HERNIA REPAIR     Patient Active Problem List   Diagnosis Date Noted   Abnormal findings on diagnostic imaging of other specified body structures 01/14/2022   Chronic pain 01/14/2022   Colon cancer screening 01/14/2022   Dysphagia 01/14/2022   Family history of malignant neoplasm of digestive organs 01/14/2022   Hyperglycemia due to type 2 diabetes mellitus (HCC) 01/14/2022   Hypertension 01/14/2022   Meralgia paresthetica 01/14/2022   Mixed hyperlipidemia 01/14/2022   Morbid obesity (HCC) 01/14/2022   Overweight 01/14/2022   Pure hypercholesterolemia 01/14/2022   Spinal cord disease (HCC) 01/14/2022   Chronic cough 12/01/2021   Diabetes (HCC) 07/08/2021   Glaucoma 07/08/2021   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   Visual field defect of left eye 07/08/2021   Debility 06/16/2021   Hypokalemia     Seizures (HCC)    Benign essential HTN    Controlled type 2 diabetes mellitus with hyperglycemia, without long-term current use of insulin (HCC)    Stridor 06/05/2021   Status post tracheostomy (HCC)    Acute respiratory failure with hypoxia (HCC)    Status epilepticus (HCC)    Brain tumor (HCC) 05/18/2021    PCP: Renford Dills, MD  REFERRING PROVIDER: Donalee Citrin, MD  REFERRING DIAG: LS spinal stenosis with neurogenic claudication  Rationale for Evaluation and Treatment: Rehabilitation  THERAPY DIAG:  Muscle weakness (generalized)  Difficulty in walking, not elsewhere classified  Unsteadiness on feet  Chronic bilateral low back pain with bilateral sciatica  ONSET DATE: chronic  SUBJECTIVE:  SUBJECTIVE STATEMENT: I was hurting this morning in the leg. I took a Tylenol and now it feels better.   PERTINENT HISTORY:  The patient has history of chronic lower back pain, participated in PT over 4 months ago for similar problem. Reports increased Sx and progression now of pain to ant thighs , was B post thighs and LB.  Referred to PT by neurologist  PAIN:  Are you having pain 3/10 Both legs PRECAUTIONS: Fall  RED FLAGS: None   WEIGHT BEARING RESTRICTIONS: No  FALLS:  Has patient fallen in last 6 months? No  LIVING ENVIRONMENT: Lives with: lives alone Lives in: House/apartment Stairs:  ramp Has following equipment at home: Single point cane, Environmental consultant - 2 wheeled, Environmental consultant - 4 wheeled, and Ramped entry  OCCUPATION: works in a church 4 days a week  PLOF: Independent with household mobility with device, Needs assistance with ADLs, and Needs assistance with homemaking  PATIENT GOALS: reduce pain for better standing tolerance  NEXT MD VISIT: unclear  OBJECTIVE:   DIAGNOSTIC FINDINGS:   na  PATIENT SURVEYS:  Modified Oswestry 26/50   SCREENING FOR RED FLAGS: Bowel or bladder incontinence: No Spinal tumors: No Cauda equina syndrome: No Compression fracture: No Abdominal aneurysm: No  COGNITION: Overall cognitive status: Within functional limits for tasks assessed     SENSATION: Light touch intact B LE's today  MUSCLE LENGTH: Hamstrings: Right wfl deg; Left wfl deg Maisie Fus test: Right wfl deg; Left wfl deg  POSTURE:  narrowed stance B, trendelenburg lurch B, with increased sway with gait, increased lumbar lordosis  PALPATION: Non tender with palpation lumbar paraspinals and B post hip musculature  LUMBAR ROM:   AROM eval  Flexion 80%  Extension 50%  Right lateral flexion 50  Left lateral flexion 50  Right rotation   Left rotation    (Blank rows = not tested)  LOWER EXTREMITY ROM:   all LE AAROM WNL B LOWER EXTREMITY MMT:    MMT Right eval Left eval  Hip flexion 3 3-  Hip extension 3+ 3+  Hip abduction 3 3-  Hip adduction    Hip internal rotation    Hip external rotation    Knee flexion    Knee extension 4 4  Ankle dorsiflexion    Ankle plantarflexion 4- 4-  Ankle inversion    Ankle eversion     (Blank rows = not tested)  LUMBAR SPECIAL TESTS:  Straight leg raise test: Negative and FABER test: Negative  FUNCTIONAL TESTS  30 seconds chair stand test 8 reps Timed up and go (TUG): 20.15 2 minute walk test: 90', 1 min 10 sec, stopped due to B thigh pain  GAIT: Distance walked: 74' Assistive device utilized: Single point cane Level of assistance: Modified independence Comments: fatigued quickly, increased trunk sway and decreased hip control with fatigue  TODAY'S TREATMENT:  DATE: 10/27/23 NuStep L5x4mins  5# hip flexion with SPC Seated tapping to the side onto 6" step w/5# 2x10 Supine bridges x10 LTR  x10 Supine LE stretching  Feet on pball rotations and knees to chest  STS w/chest press yellow ball 2x8   10/24/23 NuStep L5 x24mins  S2S w/ chest press yellow ball 2x10 Alt 4in box taps, then 6in x 10 each  Alt 6in box taps from airex 2x5 Sides steps over objects  Calf raises 2x10  Shoulder Ext 5lb 2x10 Seated rows blue 2x15 Seated hip abduction bloe 2x15  10/17/23 STS x10, then with chest press 3# x10  Side steps over bands on floor  4 way step overs NuStep L5x71mins  Box taps 6" Calf raises 2x10  Standing march 5# w/SPC  10/13/23 Recheck goals after one month of not being here  -STS -BERG -TUG  NuStep L5x78mins  HS curls 25lb 2x10 Leg Ext 5lb 2x10 Walking laps without AD 2 big laps    09/12/23 CHECKED Goals  Updated POC Berg STS TUG walk test and oswestry  08/22/23 NuStep L5 x3mins  HS curls 25lb 2x10 Leg Ext 5lb 2x10 Alt 6in box taps form airex w/ SPC 2x10 S2S LE on airex 2x10 Standing on airex row and ext green band 2x10 Side steps over obstacles CGA  08/18/23 NuStep L5 x45mins  Box taps 6"  Calf raises 2x10 Toe raises 2x10 STS holding yellow ball 2x10 LAQ 5# 2x10  HS curls green 25# 2x10 On airex marching using SPC- minA  Standing row and ext green band 2x10  08/15/23 NuStep L5x43mins  Resisted gait 20# 4 way x4 CGA  Alt 4 & 6in box taps x10 each STS on airex 2x10  Side step over WaTE 2x5 each HS curls 25# 2x10 Leg ext 10# 2x10  08/04/23 NuStep L5x38mins  5# seated hip flexion, lateral tap on 4" step  5# standing marches w/SPC  Leg ext 10# 2x10 HS curls 25# 2x10 STS on airex 2x10   07/25/23 NuStep L5 x45mins Resisted gait 20# 4 way x4 CGA  Side steps over obstacles CGA Modified sit ups holding yellow ball 2x10  STS holding yellow ball 2x10 Calf raises 2x12 Toe raises 2x12   07/21/23 Assessed and doc goals for renewal Nustep L 5 20# resisted gait 5 x fwd,5 x backward, 3 x each way BERG 39/56   Standing HHA 5# ankle  wts stepping over foam roll 2 sets 5 mod A struggled with LLE. Then lateral step 2 set 5 min A Standing HHA 5# ankle wts    07/11/23 NuStep L5 x51mins  Walking on beam Side steps on airex standing by steps  Step ups 6" with hand rail  STS on airex with red ball chest press 2x10 Calf raises 2x10  Calf stretch 15s x2 Toe raises 2x10 Walking without AD 2 big laps       Education details: POC, goals Person educated: Patient Education method: Explanation, Demonstration, Tactile cues, Verbal cues, and Handouts Education comprehension: verbalized understanding, returned demonstration, verbal cues required, and tactile cues required  HOME EXERCISE PROGRAM: Access Code: [email or web address] URL: [email or web address] Date: 05/16/2023 Prepared by: Amy Speaks  Exercises - Supine Lower Trunk Rotation  - 1 x daily - 3 x weekly - 3 sets - 10 reps - Hooklying Single Knee to Chest  - 1 x daily - 3 x weekly - 3 sets - 10 reps - Supine Bridge  - 1 x daily - 3 x weekly -  3 sets - 10 reps  ASSESSMENT:  CLINICAL IMPRESSION:  Pt returns with ongoing pain in her R thigh. Today we continued to work on strength and working on LBP. Has increased trunk lean with hip flexion on to box needed cues to stay upright. Patient states lower trunk rotations help eliminate her pain and she tries to do them in bed before she sleeps and before she gets up. Pt would benefit from continued skilled therapy to increase functional strength and decrease fall risk    OBJECTIVE IMPAIRMENTS: decreased activity tolerance, decreased balance, decreased endurance, difficulty walking, decreased strength, impaired perceived functional ability, postural dysfunction, and pain.   ACTIVITY LIMITATIONS: carrying, lifting, bending, standing, squatting, stairs, transfers, bathing, and locomotion level  PARTICIPATION LIMITATIONS: cleaning and laundry  PERSONAL FACTORS: Age, Behavior pattern, Fitness, Past/current experiences, Time  since onset of injury/illness/exacerbation, Transportation, and 3+ comorbidities: h/o brain tumor, visual loss , DM  are also affecting patient's functional outcome.   REHAB POTENTIAL: Good  CLINICAL DECISION MAKING: Evolving/moderate complexity  EVALUATION COMPLEXITY: Moderate   GOALS: Goals reviewed with patient? Yes  SHORT TERM GOALS: Target date: 2 weeks   I HEP Baseline:initiated at eval Goal status: 06/16/23 MET   LONG TERM GOALS: Target date: 12/01/23  30 sec sit to stand 11 Baseline: 8 Goal status: 06/23/23  progressing 10 x from mat without UE 07/07/23 10 x mat without UE progressing 07/21/23 10x ,11 x in 33.4 sec 09/12/23 ongoing 8 reps in 30 seconds  10/13/23-- 11 reps MET  2.  TUG improve to 14 or less Baseline: 20.15 Goal status:  06/23/23 10.33 sec MET no AD  3.  2 min walk test, 250' or greater Baseline: walked 90 ' in one min and had to stop limited by pain Goal status: 06/23/23 progressing walked 200 feet in 90 sec with SPC- SOB then walked without AD 100 feet 43 sec then stopped d/t weakness and increased lateral sway  10/10 /24 amb 200 feet without AD slight limp on RT 1 min 28 sec progressing 07/21/23 2 big laps in gym 200 feet no AD 1 min 30 sec- decreased control with rt step,lands hard on RT esp as she fatigues. SPC 1 min 10 sec with smoother stride and better control of RT LE.  Met 09/12/23 265ft w/ SPC 2.5 laps around the gym  4.  Modified oswestry 36/50 Baseline: 26/50 Goal status: Ongoing 25/50 09/12/23, 29/50 ongoing 10/13/23  5. Patient will improve BERG score to 45/56 or better   Baseline: 39/56  Goal status: 44/56 09/12/23 Progressing   10/13/23- MET 46/56  6. Patient will demonstrate improved gait and be able to walk 115ft with decreased limp and no cane  Baseline: "I want to walk straighter but I feel like I lean forward because of pain"  Goal status: progressing 10/17/23    PLAN:  PT FREQUENCY: 2x/week  PT DURATION: 8 weeks  PLANNED  INTERVENTIONS: Therapeutic exercises, Therapeutic activity, Neuromuscular re-education, Balance training, Gait training, Patient/Family education, Self Care, and Joint mobilization.  PLAN FOR NEXT SESSION: calf and toe raises   Cassie Freer, PT,  10/27/2023, 5:45 PM [place] [facility] at [facility] [place], [place], [address] Phone: [phone]   Fax:  [phone]

## 2023-10-31 ENCOUNTER — Ambulatory Visit: Payer: Medicare Other | Attending: Neurosurgery

## 2023-10-31 DIAGNOSIS — M5459 Other low back pain: Secondary | ICD-10-CM | POA: Diagnosis present

## 2023-10-31 DIAGNOSIS — R262 Difficulty in walking, not elsewhere classified: Secondary | ICD-10-CM | POA: Insufficient documentation

## 2023-10-31 DIAGNOSIS — G8929 Other chronic pain: Secondary | ICD-10-CM | POA: Diagnosis present

## 2023-10-31 DIAGNOSIS — R278 Other lack of coordination: Secondary | ICD-10-CM | POA: Diagnosis present

## 2023-10-31 DIAGNOSIS — M5441 Lumbago with sciatica, right side: Secondary | ICD-10-CM | POA: Insufficient documentation

## 2023-10-31 DIAGNOSIS — M6281 Muscle weakness (generalized): Secondary | ICD-10-CM | POA: Insufficient documentation

## 2023-10-31 DIAGNOSIS — M5442 Lumbago with sciatica, left side: Secondary | ICD-10-CM | POA: Diagnosis present

## 2023-10-31 DIAGNOSIS — R2681 Unsteadiness on feet: Secondary | ICD-10-CM | POA: Diagnosis present

## 2023-10-31 NOTE — Therapy (Signed)
OUTPATIENT PHYSICAL TREATMENT  Patient Name: Olivia Shah MRN: [id number] DOB:Oct 18, 1955, 68 y.o., female Today's Date: 10/31/2023  END OF SESSION:  PT End of Session - 10/31/23 1013     Visit Number 23    Date for PT Re-Evaluation 12/01/23    PT Start Time 1013    PT Stop Time 1058    PT Time Calculation (min) 45 min    Activity Tolerance Patient tolerated treatment well    Behavior During Therapy [facility] for tasks assessed/performed                           Past Medical History:  Diagnosis Date   Arthritis    Diabetes mellitus without complication (HCC)    Hypertension    Past Surgical History:  Procedure Laterality Date   APPLICATION OF CRANIAL NAVIGATION N/A 05/18/2021   Procedure: APPLICATION OF CRANIAL NAVIGATION;  Surgeon: Jadene Pierini, MD;  Location: [facility] OR;  Service: Neurosurgery;  Laterality: N/A;   BREAST BIOPSY Right    CHOLECYSTECTOMY     CRANIOTOMY Right 05/18/2021   Procedure: Right craniotomy for tumor resection;  Surgeon: Jadene Pierini, MD;  Location: [facility] OR;  Service: Neurosurgery;  Laterality: Right;   EYE SURGERY     as a child   HERNIA REPAIR     Patient Active Problem List   Diagnosis Date Noted   Abnormal findings on diagnostic imaging of other specified body structures 01/14/2022   Chronic pain 01/14/2022   Colon cancer screening 01/14/2022   Dysphagia 01/14/2022   Family history of malignant neoplasm of digestive organs 01/14/2022   Hyperglycemia due to type 2 diabetes mellitus (HCC) 01/14/2022   Hypertension 01/14/2022   Meralgia paresthetica 01/14/2022   Mixed hyperlipidemia 01/14/2022   Morbid obesity (HCC) 01/14/2022   Overweight 01/14/2022   Pure hypercholesterolemia 01/14/2022   Spinal cord disease (HCC) 01/14/2022   Chronic cough 12/01/2021   Diabetes (HCC) 07/08/2021   Glaucoma 07/08/2021   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   Visual field defect of left eye 07/08/2021   Debility 06/16/2021   Hypokalemia     Seizures (HCC)    Benign essential HTN    Controlled type 2 diabetes mellitus with hyperglycemia, without long-term current use of insulin (HCC)    Stridor 06/05/2021   Status post tracheostomy (HCC)    Acute respiratory failure with hypoxia (HCC)    Status epilepticus (HCC)    Brain tumor (HCC) 05/18/2021    PCP: Renford Dills, MD  REFERRING PROVIDER: Donalee Citrin, MD  REFERRING DIAG: LS spinal stenosis with neurogenic claudication  Rationale for Evaluation and Treatment: Rehabilitation  THERAPY DIAG:  Muscle weakness (generalized)  Difficulty in walking, not elsewhere classified  Unsteadiness on feet  Chronic bilateral low back pain with bilateral sciatica  Other lack of coordination  Other low back pain  ONSET DATE: chronic  SUBJECTIVE:  SUBJECTIVE STATEMENT: I don't like to complain, but when I got up to walk to the car my leg started hurting.   PERTINENT HISTORY:  The patient has history of chronic lower back pain, participated in PT over 4 months ago for similar problem. Reports increased Sx and progression now of pain to ant thighs , was B post thighs and LB.  Referred to PT by neurologist  PAIN:  Are you having pain 5/10 R thigh  PRECAUTIONS: Fall  RED FLAGS: None   WEIGHT BEARING RESTRICTIONS: No  FALLS:  Has patient fallen in last 6 months? No  LIVING ENVIRONMENT: Lives with: lives alone Lives in: House/apartment Stairs:  ramp Has following equipment at home: Single point cane, Environmental consultant - 2 wheeled, Environmental consultant - 4 wheeled, and Ramped entry  OCCUPATION: works in a church 4 days a week  PLOF: Independent with household mobility with device, Needs assistance with ADLs, and Needs assistance with homemaking  PATIENT GOALS: reduce pain for better standing tolerance  NEXT MD  VISIT: unclear  OBJECTIVE:   DIAGNOSTIC FINDINGS:  na  PATIENT SURVEYS:  Modified Oswestry 26/50   SCREENING FOR RED FLAGS: Bowel or bladder incontinence: No Spinal tumors: No Cauda equina syndrome: No Compression fracture: No Abdominal aneurysm: No  COGNITION: Overall cognitive status: Within functional limits for tasks assessed     SENSATION: Light touch intact B LE's today  MUSCLE LENGTH: Hamstrings: Right wfl deg; Left wfl deg Maisie Fus test: Right wfl deg; Left wfl deg  POSTURE:  narrowed stance B, trendelenburg lurch B, with increased sway with gait, increased lumbar lordosis  PALPATION: Non tender with palpation lumbar paraspinals and B post hip musculature  LUMBAR ROM:   AROM eval  Flexion 80%  Extension 50%  Right lateral flexion 50  Left lateral flexion 50  Right rotation   Left rotation    (Blank rows = not tested)  LOWER EXTREMITY ROM:   all LE AAROM WNL B LOWER EXTREMITY MMT:    MMT Right eval Left eval  Hip flexion 3 3-  Hip extension 3+ 3+  Hip abduction 3 3-  Hip adduction    Hip internal rotation    Hip external rotation    Knee flexion    Knee extension 4 4  Ankle dorsiflexion    Ankle plantarflexion 4- 4-  Ankle inversion    Ankle eversion     (Blank rows = not tested)  LUMBAR SPECIAL TESTS:  Straight leg raise test: Negative and FABER test: Negative  FUNCTIONAL TESTS  30 seconds chair stand test 8 reps Timed up and go (TUG): 20.15 2 minute walk test: 90', 1 min 10 sec, stopped due to B thigh pain  GAIT: Distance walked: 22' Assistive device utilized: Single point cane Level of assistance: Modified independence Comments: fatigued quickly, increased trunk sway and decreased hip control with fatigue  TODAY'S TREATMENT:  DATE: 10/31/23 NuStep L5x43mins Calf raises 2x10 Toe raises 2x10 Calf stretch on  slant board  Leg ext 5# 2x10 Green band HS curls 2x10 Walking 2 big laps no breaks  Pball stretches forwards x10  10/27/23 NuStep L5x75mins  5# hip flexion with SPC Seated tapping to the side onto 6" step w/5# 2x10 Supine bridges x10 LTR x10 Supine LE stretching  Feet on pball rotations and knees to chest  STS w/chest press yellow ball 2x8   10/24/23 NuStep L5 x74mins  S2S w/ chest press yellow ball 2x10 Alt 4in box taps, then 6in x 10 each  Alt 6in box taps from airex 2x5 Sides steps over objects  Calf raises 2x10  Shoulder Ext 5lb 2x10 Seated rows blue 2x15 Seated hip abduction bloe 2x15  10/17/23 STS x10, then with chest press 3# x10  Side steps over bands on floor  4 way step overs NuStep L5x53mins  Box taps 6" Calf raises 2x10  Standing march 5# w/SPC  10/13/23 Recheck goals after one month of not being here  -STS -BERG -TUG  NuStep L5x31mins  HS curls 25lb 2x10 Leg Ext 5lb 2x10 Walking laps without AD 2 big laps    09/12/23 CHECKED Goals  Updated POC Berg STS TUG walk test and oswestry  08/22/23 NuStep L5 x69mins  HS curls 25lb 2x10 Leg Ext 5lb 2x10 Alt 6in box taps form airex w/ SPC 2x10 S2S LE on airex 2x10 Standing on airex row and ext green band 2x10 Side steps over obstacles CGA  08/18/23 NuStep L5 x16mins  Box taps 6"  Calf raises 2x10 Toe raises 2x10 STS holding yellow ball 2x10 LAQ 5# 2x10  HS curls green 25# 2x10 On airex marching using SPC- minA  Standing row and ext green band 2x10  08/15/23 NuStep L5x48mins  Resisted gait 20# 4 way x4 CGA  Alt 4 & 6in box taps x10 each STS on airex 2x10  Side step over WaTE 2x5 each HS curls 25# 2x10 Leg ext 10# 2x10  08/04/23 NuStep L5x71mins  5# seated hip flexion, lateral tap on 4" step  5# standing marches w/SPC  Leg ext 10# 2x10 HS curls 25# 2x10 STS on airex 2x10   07/25/23 NuStep L5 x70mins Resisted gait 20# 4 way x4 CGA  Side steps over obstacles CGA Modified sit ups  holding yellow ball 2x10  STS holding yellow ball 2x10 Calf raises 2x12 Toe raises 2x12   07/21/23 Assessed and doc goals for renewal Nustep L 5 20# resisted gait 5 x fwd,5 x backward, 3 x each way BERG 39/56   Standing HHA 5# ankle wts stepping over foam roll 2 sets 5 mod A struggled with LLE. Then lateral step 2 set 5 min A Standing HHA 5# ankle wts    07/11/23 NuStep L5 x79mins  Walking on beam Side steps on airex standing by steps  Step ups 6" with hand rail  STS on airex with red ball chest press 2x10 Calf raises 2x10  Calf stretch 15s x2 Toe raises 2x10 Walking without AD 2 big laps       Education details: POC, goals Person educated: Patient Education method: Explanation, Demonstration, Tactile cues, Verbal cues, and Handouts Education comprehension: verbalized understanding, returned demonstration, verbal cues required, and tactile cues required  HOME EXERCISE PROGRAM: Access Code: [email or web address] URL: [email or web address] Date: 05/16/2023 Prepared by: Amy Speaks  Exercises - Supine Lower Trunk Rotation  - 1 x daily - 3 x weekly - 3 sets -  10 reps - Hooklying Single Knee to Chest  - 1 x daily - 3 x weekly - 3 sets - 10 reps - Supine Bridge  - 1 x daily - 3 x weekly - 3 sets - 10 reps  ASSESSMENT:  CLINICAL IMPRESSION:  Pt returns with ongoing pain in her R thigh today. Reports she felt it when going to the car to come to therapy today. Today we continued to work on LE functional tasks and some light strengthening. She reports some low back pain halfway into the walk, so we ended with some stretching. Pt would benefit from continued skilled therapy to increase functional strength and decrease fall risk    OBJECTIVE IMPAIRMENTS: decreased activity tolerance, decreased balance, decreased endurance, difficulty walking, decreased strength, impaired perceived functional ability, postural dysfunction, and pain.   ACTIVITY LIMITATIONS: carrying,  lifting, bending, standing, squatting, stairs, transfers, bathing, and locomotion level  PARTICIPATION LIMITATIONS: cleaning and laundry  PERSONAL FACTORS: Age, Behavior pattern, Fitness, Past/current experiences, Time since onset of injury/illness/exacerbation, Transportation, and 3+ comorbidities: h/o brain tumor, visual loss , DM  are also affecting patient's functional outcome.   REHAB POTENTIAL: Good  CLINICAL DECISION MAKING: Evolving/moderate complexity  EVALUATION COMPLEXITY: Moderate   GOALS: Goals reviewed with patient? Yes  SHORT TERM GOALS: Target date: 2 weeks   I HEP Baseline:initiated at eval Goal status: 06/16/23 MET   LONG TERM GOALS: Target date: 12/01/23  30 sec sit to stand 11 Baseline: 8 Goal status: 06/23/23  progressing 10 x from mat without UE 07/07/23 10 x mat without UE progressing 07/21/23 10x ,11 x in 33.4 sec 09/12/23 ongoing 8 reps in 30 seconds  10/13/23-- 11 reps MET  2.  TUG improve to 14 or less Baseline: 20.15 Goal status:  06/23/23 10.33 sec MET no AD  3.  2 min walk test, 250' or greater Baseline: walked 90 ' in one min and had to stop limited by pain Goal status: 06/23/23 progressing walked 200 feet in 90 sec with SPC- SOB then walked without AD 100 feet 43 sec then stopped d/t weakness and increased lateral sway  10/10 /24 amb 200 feet without AD slight limp on RT 1 min 28 sec progressing 07/21/23 2 big laps in gym 200 feet no AD 1 min 30 sec- decreased control with rt step,lands hard on RT esp as she fatigues. SPC 1 min 10 sec with smoother stride and better control of RT LE.  Met 09/12/23 275ft w/ SPC 2.5 laps around the gym  4.  Modified oswestry 36/50 Baseline: 26/50 Goal status: Ongoing 25/50 09/12/23, 29/50 ongoing 10/13/23  5. Patient will improve BERG score to 45/56 or better   Baseline: 39/56  Goal status: 44/56 09/12/23 Progressing   10/13/23- MET 46/56  6. Patient will demonstrate improved gait and be able to walk 167ft with  decreased limp and no cane  Baseline: "I want to walk straighter but I feel like I lean forward because of pain"  Goal status: progressing 10/17/23    PLAN:  PT FREQUENCY: 2x/week  PT DURATION: 8 weeks  PLANNED INTERVENTIONS: Therapeutic exercises, Therapeutic activity, Neuromuscular re-education, Balance training, Gait training, Patient/Family education, Self Care, and Joint mobilization.  PLAN FOR NEXT SESSION: functional strengthening, LE balance and pain management    Cassie Freer, PT,  10/31/2023, 11:01 AM [facility] Outpatient Rehabilitation at [facility] [place], [place], [address] Phone: [phone]   Fax:  [phone]

## 2023-11-03 ENCOUNTER — Ambulatory Visit: Payer: Medicare Other | Admitting: Physical Therapy

## 2023-11-03 DIAGNOSIS — M6281 Muscle weakness (generalized): Secondary | ICD-10-CM

## 2023-11-03 DIAGNOSIS — R262 Difficulty in walking, not elsewhere classified: Secondary | ICD-10-CM

## 2023-11-03 DIAGNOSIS — R2681 Unsteadiness on feet: Secondary | ICD-10-CM

## 2023-11-03 NOTE — Therapy (Signed)
 OUTPATIENT PHYSICAL TREATMENT  Patient Name: Olivia Shah MRN: [id number] DOB:Sep 21, 1956, 68 y.o., female Today's Date: 11/03/2023  END OF SESSION:  PT End of Session - 11/03/23 1658     Visit Number 24    Date for PT Re-Evaluation 12/01/23    PT Start Time 1455    PT Stop Time 1545    PT Time Calculation (min) 50 min                           Past Medical History:  Diagnosis Date   Arthritis    Diabetes mellitus without complication (HCC)    Hypertension    Past Surgical History:  Procedure Laterality Date   APPLICATION OF CRANIAL NAVIGATION N/A 05/18/2021   Procedure: APPLICATION OF CRANIAL NAVIGATION;  Surgeon: Cheryle Debby LABOR, MD;  Location: [facility] OR;  Service: Neurosurgery;  Laterality: N/A;   BREAST BIOPSY Right    CHOLECYSTECTOMY     CRANIOTOMY Right 05/18/2021   Procedure: Right craniotomy for tumor resection;  Surgeon: Cheryle Debby LABOR, MD;  Location: [facility] OR;  Service: Neurosurgery;  Laterality: Right;   EYE SURGERY     as a child   HERNIA REPAIR     Patient Active Problem List   Diagnosis Date Noted   Abnormal findings on diagnostic imaging of other specified body structures 01/14/2022   Chronic pain 01/14/2022   Colon cancer screening 01/14/2022   Dysphagia 01/14/2022   Family history of malignant neoplasm of digestive organs 01/14/2022   Hyperglycemia due to type 2 diabetes mellitus (HCC) 01/14/2022   Hypertension 01/14/2022   Meralgia paresthetica 01/14/2022   Mixed hyperlipidemia 01/14/2022   Morbid obesity (HCC) 01/14/2022   Overweight 01/14/2022   Pure hypercholesterolemia 01/14/2022   Spinal cord disease (HCC) 01/14/2022   Chronic cough 12/01/2021   Diabetes (HCC) 07/08/2021   Glaucoma 07/08/2021   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   Visual field defect of left eye 07/08/2021   Debility 06/16/2021   Hypokalemia    Seizures (HCC)    Benign essential HTN    Controlled type 2 diabetes mellitus with hyperglycemia, without  long-term current use of insulin  (HCC)    Stridor 06/05/2021   Status post tracheostomy (HCC)    Acute respiratory failure with hypoxia (HCC)    Status epilepticus (HCC)    Brain tumor (HCC) 05/18/2021    PCP: Rexanne Ingle, MD  REFERRING PROVIDER: Arley Helling, MD  REFERRING DIAG: LS spinal stenosis with neurogenic claudication  Rationale for Evaluation and Treatment: Rehabilitation  THERAPY DIAG:  Muscle weakness (generalized)  Difficulty in walking, not elsewhere classified  Unsteadiness on feet  ONSET DATE: chronic  SUBJECTIVE:  SUBJECTIVE STATEMENT: This morning was rough ,front of thighs hurt back 6/10 and had to take tylenol   PERTINENT HISTORY:  The patient has history of chronic lower back pain, participated in PT over 4 months ago for similar problem. Reports increased Sx and progression now of pain to ant thighs , was B post thighs and LB.  Referred to PT by neurologist  PAIN:  Are you having pain RT knee/thigh 3/10  PRECAUTIONS: Fall  RED FLAGS: None   WEIGHT BEARING RESTRICTIONS: No  FALLS:  Has patient fallen in last 6 months? No  LIVING ENVIRONMENT: Lives with: lives alone Lives in: House/apartment Stairs:  ramp Has following equipment at home: Single point cane, Environmental Consultant - 2 wheeled, Environmental Consultant - 4 wheeled, and Ramped entry  OCCUPATION: works in a church 4 days a week  PLOF: Independent with household mobility with device, Needs assistance with ADLs, and Needs assistance with homemaking  PATIENT GOALS: reduce pain for better standing tolerance  NEXT MD VISIT: unclear  OBJECTIVE:   DIAGNOSTIC FINDINGS:  na  PATIENT SURVEYS:  Modified Oswestry 26/50   SCREENING FOR RED FLAGS: Bowel or bladder incontinence: No Spinal tumors: No Cauda equina syndrome:  No Compression fracture: No Abdominal aneurysm: No  COGNITION: Overall cognitive status: Within functional limits for tasks assessed     SENSATION: Light touch intact B LE's today  MUSCLE LENGTH: Hamstrings: Right wfl deg; Left wfl deg Debby test: Right wfl deg; Left wfl deg  POSTURE:  narrowed stance B, trendelenburg lurch B, with increased sway with gait, increased lumbar lordosis  PALPATION: Non tender with palpation lumbar paraspinals and B post hip musculature  LUMBAR ROM:   AROM eval  Flexion 80%  Extension 50%  Right lateral flexion 50  Left lateral flexion 50  Right rotation   Left rotation    (Blank rows = not tested)  LOWER EXTREMITY ROM:   all LE AAROM WNL B LOWER EXTREMITY MMT:    MMT Right eval Left eval  Hip flexion 3 3-  Hip extension 3+ 3+  Hip abduction 3 3-  Hip adduction    Hip internal rotation    Hip external rotation    Knee flexion    Knee extension 4 4  Ankle dorsiflexion    Ankle plantarflexion 4- 4-  Ankle inversion    Ankle eversion     (Blank rows = not tested)  LUMBAR SPECIAL TESTS:  Straight leg raise test: Negative and FABER test: Negative  FUNCTIONAL TESTS  30 seconds chair stand test 8 reps Timed up and go (TUG): 20.15 2 minute walk test: 90', 1 min 10 sec, stopped due to B thigh pain  GAIT: Distance walked: 47' Assistive device utilized: Single point cane Level of assistance: Modified independence Comments: fatigued quickly, increased trunk sway and decreased hip control with fatigue  TODAY'S TREATMENT:  DATE:  11/03/23 Nustep L 5 7 min Alt step tap 20 x 6 inch 2 x with 3 # ankle wts 3# HHA marching fwd and backwards 20 feet 2 x each, then side stepping 3# ankle wts HHA alt LE SL hip flex,ext and abd 20 x Black tband trunk flex and ext 20 x each HS curls 25lb 2x10 Leg Ext 5lb 2x10 Worked  on amb level surface without AD working on cadeance  and balance with cuing to pick feet up, trendelenburg walking   10/31/23 NuStep L5x1mins Calf raises 2x10 Toe raises 2x10 Calf stretch on slant board  Leg ext 5# 2x10 Green band HS curls 2x10 Walking 2 big laps no breaks  Pball stretches forwards x10  10/27/23 NuStep L5x62mins  5# hip flexion with SPC Seated tapping to the side onto 6 step w/5# 2x10 Supine bridges x10 LTR x10 Supine LE stretching  Feet on pball rotations and knees to chest  STS w/chest press yellow ball 2x8   10/24/23 NuStep L5 x11mins  S2S w/ chest press yellow ball 2x10 Alt 4in box taps, then 6in x 10 each  Alt 6in box taps from airex 2x5 Sides steps over objects  Calf raises 2x10  Shoulder Ext 5lb 2x10 Seated rows blue 2x15 Seated hip abduction bloe 2x15  10/17/23 STS x10, then with chest press 3# x10  Side steps over bands on floor  4 way step overs NuStep L5x59mins  Box taps 6 Calf raises 2x10  Standing march 5# w/SPC  10/13/23 Recheck goals after one month of not being here  -STS -BERG -TUG  NuStep L5x42mins  HS curls 25lb 2x10 Leg Ext 5lb 2x10 Walking laps without AD 2 big laps    09/12/23 CHECKED Goals  Updated POC Berg STS TUG walk test and oswestry  08/22/23 NuStep L5 x71mins  HS curls 25lb 2x10 Leg Ext 5lb 2x10 Alt 6in box taps form airex w/ SPC 2x10 S2S LE on airex 2x10 Standing on airex row and ext green band 2x10 Side steps over obstacles CGA  08/18/23 NuStep L5 x72mins  Box taps 6  Calf raises 2x10 Toe raises 2x10 STS holding yellow ball 2x10 LAQ 5# 2x10  HS curls green 25# 2x10 On airex marching using SPC- minA  Standing row and ext green band 2x10  08/15/23 NuStep L5x67mins  Resisted gait 20# 4 way x4 CGA  Alt 4 & 6in box taps x10 each STS on airex 2x10  Side step over WaTE 2x5 each HS curls 25# 2x10 Leg ext 10# 2x10  08/04/23 NuStep L5x89mins  5# seated hip flexion, lateral tap on 4 step  5#  standing marches w/SPC  Leg ext 10# 2x10 HS curls 25# 2x10 STS on airex 2x10   07/25/23 NuStep L5 x45mins Resisted gait 20# 4 way x4 CGA  Side steps over obstacles CGA Modified sit ups holding yellow ball 2x10  STS holding yellow ball 2x10 Calf raises 2x12 Toe raises 2x12   07/21/23 Assessed and doc goals for renewal Nustep L 5 20# resisted gait 5 x fwd,5 x backward, 3 x each way BERG 39/56   Standing HHA 5# ankle wts stepping over foam roll 2 sets 5 mod A struggled with LLE. Then lateral step 2 set 5 min A Standing HHA 5# ankle wts    07/11/23 NuStep L5 x27mins  Walking on beam Side steps on airex standing by steps  Step ups 6 with hand rail  STS on airex with red ball chest press 2x10  Calf raises 2x10  Calf stretch 15s x2 Toe raises 2x10 Walking without AD 2 big laps       Education details: POC, goals Person educated: Patient Education method: Explanation, Demonstration, Tactile cues, Verbal cues, and Handouts Education comprehension: verbalized understanding, returned demonstration, verbal cues required, and tactile cues required  HOME EXERCISE PROGRAM: Access Code: [email or web address] URL: [email or web address] Date: 05/16/2023 Prepared by: Amy Speaks  Exercises - Supine Lower Trunk Rotation  - 1 x daily - 3 x weekly - 3 sets - 10 reps - Hooklying Single Knee to Chest  - 1 x daily - 3 x weekly - 3 sets - 10 reps - Supine Bridge  - 1 x daily - 3 x weekly - 3 sets - 10 reps  ASSESSMENT:  CLINICAL IMPRESSION:  pt states woke up with 6/10 BIL thigh pain and did some stretching with some relief. Pain comes and goes in various degrees.added core strengthening and LE strength with balance ex. Continue to progress func strength with assistance needed,cuig to prevent compensation as well as seated rest for SOB    OBJECTIVE IMPAIRMENTS: decreased activity tolerance, decreased balance, decreased endurance, difficulty walking, decreased strength, impaired  perceived functional ability, postural dysfunction, and pain.   ACTIVITY LIMITATIONS: carrying, lifting, bending, standing, squatting, stairs, transfers, bathing, and locomotion level  PARTICIPATION LIMITATIONS: cleaning and laundry  PERSONAL FACTORS: Age, Behavior pattern, Fitness, Past/current experiences, Time since onset of injury/illness/exacerbation, Transportation, and 3+ comorbidities: h/o brain tumor, visual loss , DM  are also affecting patient's functional outcome.   REHAB POTENTIAL: Good  CLINICAL DECISION MAKING: Evolving/moderate complexity  EVALUATION COMPLEXITY: Moderate   GOALS: Goals reviewed with patient? Yes  SHORT TERM GOALS: Target date: 2 weeks   I HEP Baseline:initiated at eval Goal status: 06/16/23 MET   LONG TERM GOALS: Target date: 12/01/23  30 sec sit to stand 11 Baseline: 8 Goal status: 06/23/23  progressing 10 x from mat without UE 07/07/23 10 x mat without UE progressing 07/21/23 10x ,11 x in 33.4 sec 09/12/23 ongoing 8 reps in 30 seconds  10/13/23-- 11 reps MET  2.  TUG improve to 14 or less Baseline: 20.15 Goal status:  06/23/23 10.33 sec MET no AD  3.  2 min walk test, 250' or greater Baseline: walked 90 ' in one min and had to stop limited by pain Goal status: 06/23/23 progressing walked 200 feet in 90 sec with SPC- SOB then walked without AD 100 feet 43 sec then stopped d/t weakness and increased lateral sway  10/10 /24 amb 200 feet without AD slight limp on RT 1 min 28 sec progressing 07/21/23 2 big laps in gym 200 feet no AD 1 min 30 sec- decreased control with rt step,lands hard on RT esp as she fatigues. SPC 1 min 10 sec with smoother stride and better control of RT LE.  Met 09/12/23 225ft w/ SPC 2.5 laps around the gym  4.  Modified oswestry 36/50 Baseline: 26/50 Goal status: Ongoing 25/50 09/12/23, 29/50 ongoing 10/13/23  5. Patient will improve BERG score to 45/56 or better   Baseline: 39/56  Goal status: 44/56 09/12/23 Progressing    10/13/23- MET 46/56  6. Patient will demonstrate improved gait and be able to walk 16ft with decreased limp and no cane  Baseline: I want to walk straighter but I feel like I lean forward because of pain  Goal status: progressing 10/17/23    PLAN:  PT FREQUENCY: 2x/week  PT DURATION: 8 weeks  PLANNED INTERVENTIONS: Therapeutic  exercises, Therapeutic activity, Neuromuscular re-education, Balance training, Gait training, Patient/Family education, Self Care, and Joint mobilization.  PLAN FOR NEXT SESSION: functional strengthening, LE balance and pain management    Yoana Staib,ANGIE, PTA,  11/03/2023, 4:59 PM [facility] at [facility] [address] [facility] [place], [place], [address] Phone: [phone]   Fax:  [phone] [facility] at [facility] [address] [place], [place], [address] Phone: [phone]   Fax:  [phone]  Patient Details  Name: NANEA JARED MRN: [id number] Date of Birth: Dec 26, 1955 Referring Provider:  Onetha Kuba, MD  Encounter Date: 11/03/2023   MARSH SNIFF, PTA 11/03/2023, 4:59 PM  [place] [facility] at [facility] [address] [place], [place], [address] Phone: [phone]   Fax:  [phone]

## 2023-11-07 ENCOUNTER — Ambulatory Visit: Payer: Medicare Other

## 2023-11-07 DIAGNOSIS — G8929 Other chronic pain: Secondary | ICD-10-CM

## 2023-11-07 DIAGNOSIS — M6281 Muscle weakness (generalized): Secondary | ICD-10-CM | POA: Diagnosis not present

## 2023-11-07 DIAGNOSIS — R262 Difficulty in walking, not elsewhere classified: Secondary | ICD-10-CM

## 2023-11-07 DIAGNOSIS — R278 Other lack of coordination: Secondary | ICD-10-CM

## 2023-11-07 DIAGNOSIS — R2681 Unsteadiness on feet: Secondary | ICD-10-CM

## 2023-11-07 NOTE — Therapy (Signed)
 OUTPATIENT PHYSICAL TREATMENT  Patient Name: Olivia Shah MRN: [id number] DOB:11-16-1955, 68 y.o., female Today's Date: 11/07/2023  END OF SESSION:  PT End of Session - 11/07/23 1049     Visit Number 25    Date for PT Re-Evaluation 12/01/23    PT Start Time 1050    PT Stop Time 1135    PT Time Calculation (min) 45 min                            Past Medical History:  Diagnosis Date   Arthritis    Diabetes mellitus without complication (HCC)    Hypertension    Past Surgical History:  Procedure Laterality Date   APPLICATION OF CRANIAL NAVIGATION N/A 05/18/2021   Procedure: APPLICATION OF CRANIAL NAVIGATION;  Surgeon: Cannon Champion, MD;  Location: [facility] OR;  Service: Neurosurgery;  Laterality: N/A;   BREAST BIOPSY Right    CHOLECYSTECTOMY     CRANIOTOMY Right 05/18/2021   Procedure: Right craniotomy for tumor resection;  Surgeon: Cannon Champion, MD;  Location: [facility] OR;  Service: Neurosurgery;  Laterality: Right;   EYE SURGERY     as a child   HERNIA REPAIR     Patient Active Problem List   Diagnosis Date Noted   Abnormal findings on diagnostic imaging of other specified body structures 01/14/2022   Chronic pain 01/14/2022   Colon cancer screening 01/14/2022   Dysphagia 01/14/2022   Family history of malignant neoplasm of digestive organs 01/14/2022   Hyperglycemia due to type 2 diabetes mellitus (HCC) 01/14/2022   Hypertension 01/14/2022   Meralgia paresthetica 01/14/2022   Mixed hyperlipidemia 01/14/2022   Morbid obesity (HCC) 01/14/2022   Overweight 01/14/2022   Pure hypercholesterolemia 01/14/2022   Spinal cord disease (HCC) 01/14/2022   Chronic cough 12/01/2021   Diabetes (HCC) 07/08/2021   Glaucoma 07/08/2021   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   Visual field defect of left eye 07/08/2021   Debility 06/16/2021   Hypokalemia    Seizures (HCC)    Benign essential HTN    Controlled type 2 diabetes mellitus with hyperglycemia, without  long-term current use of insulin  (HCC)    Stridor 06/05/2021   Status post tracheostomy (HCC)    Acute respiratory failure with hypoxia (HCC)    Status epilepticus (HCC)    Brain tumor (HCC) 05/18/2021    PCP: Merl Star, MD  REFERRING PROVIDER: Gearl Keens, MD  REFERRING DIAG: LS spinal stenosis with neurogenic claudication  Rationale for Evaluation and Treatment: Rehabilitation  THERAPY DIAG:  Other lack of coordination  Muscle weakness (generalized)  Difficulty in walking, not elsewhere classified  Unsteadiness on feet  Chronic bilateral low back pain with bilateral sciatica  ONSET DATE: chronic  SUBJECTIVE:  SUBJECTIVE STATEMENT: I am alright, but leg started hurting when I went to get in the car. It is okay when I sit down.   PERTINENT HISTORY:  The patient has history of chronic lower back pain, participated in PT over 4 months ago for similar problem. Reports increased Sx and progression now of pain to ant thighs , was B post thighs and LB.  Referred to PT by neurologist  PAIN:  Are you having pain RT knee/thigh 3/10  PRECAUTIONS: Fall  RED FLAGS: None   WEIGHT BEARING RESTRICTIONS: No  FALLS:  Has patient fallen in last 6 months? No  LIVING ENVIRONMENT: Lives with: lives alone Lives in: House/apartment Stairs:  ramp Has following equipment at home: Single point cane, Environmental consultant - 2 wheeled, Environmental consultant - 4 wheeled, and Ramped entry  OCCUPATION: works in a church 4 days a week  PLOF: Independent with household mobility with device, Needs assistance with ADLs, and Needs assistance with homemaking  PATIENT GOALS: reduce pain for better standing tolerance  NEXT MD VISIT: unclear  OBJECTIVE:   DIAGNOSTIC FINDINGS:  na  PATIENT SURVEYS:  Modified Oswestry 26/50    SCREENING FOR RED FLAGS: Bowel or bladder incontinence: No Spinal tumors: No Cauda equina syndrome: No Compression fracture: No Abdominal aneurysm: No  COGNITION: Overall cognitive status: Within functional limits for tasks assessed     SENSATION: Light touch intact B LE's today  MUSCLE LENGTH: Hamstrings: Right wfl deg; Left wfl deg Andy Bannister test: Right wfl deg; Left wfl deg  POSTURE:  narrowed stance B, trendelenburg lurch B, with increased sway with gait, increased lumbar lordosis  PALPATION: Non tender with palpation lumbar paraspinals and B post hip musculature  LUMBAR ROM:   AROM eval  Flexion 80%  Extension 50%  Right lateral flexion 50  Left lateral flexion 50  Right rotation   Left rotation    (Blank rows = not tested)  LOWER EXTREMITY ROM:   all LE AAROM WNL B LOWER EXTREMITY MMT:    MMT Right eval Left eval  Hip flexion 3 3-  Hip extension 3+ 3+  Hip abduction 3 3-  Hip adduction    Hip internal rotation    Hip external rotation    Knee flexion    Knee extension 4 4  Ankle dorsiflexion    Ankle plantarflexion 4- 4-  Ankle inversion    Ankle eversion     (Blank rows = not tested)  LUMBAR SPECIAL TESTS:  Straight leg raise test: Negative and FABER test: Negative  FUNCTIONAL TESTS  30 seconds chair stand test 8 reps Timed up and go (TUG): 20.15 2 minute walk test: 90', 1 min 10 sec, stopped due to B thigh pain  GAIT: Distance walked: 2' Assistive device utilized: Single point cane Level of assistance: Modified independence Comments: fatigued quickly, increased trunk sway and decreased hip control with fatigue  TODAY'S TREATMENT:  DATE: 11/07/23 NuStep L5x60mins Side steps over bands on floor then forward steps trying to focus on big steps  Leg ext 5# 2x12  HS curls 20# 2x12  STS 2x10   Step ups 4" by stairs x10  each side    11/03/23 Nustep L 5 7 min Alt step tap 20 x 6 inch 2 x with 3 # ankle wts 3# HHA marching fwd and backwards 20 feet 2 x each, then side stepping 3# ankle wts HHA alt LE SL hip flex,ext and abd 20 x Black tband trunk flex and ext 20 x each HS curls 25lb 2x10 Leg Ext 5lb 2x10 Worked on amb level surface without AD working on cadeance  and balance with cuing to pick feet up, trendelenburg walking   10/31/23 NuStep L5x51mins Calf raises 2x10 Toe raises 2x10 Calf stretch on slant board  Leg ext 5# 2x10 Green band HS curls 2x10 Walking 2 big laps no breaks  Pball stretches forwards x10  10/27/23 NuStep L5x51mins  5# hip flexion with SPC Seated tapping to the side onto 6" step w/5# 2x10 Supine bridges x10 LTR x10 Supine LE stretching  Feet on pball rotations and knees to chest  STS w/chest press yellow ball 2x8   10/24/23 NuStep L5 x76mins  S2S w/ chest press yellow ball 2x10 Alt 4in box taps, then 6in x 10 each  Alt 6in box taps from airex 2x5 Sides steps over objects  Calf raises 2x10  Shoulder Ext 5lb 2x10 Seated rows blue 2x15 Seated hip abduction bloe 2x15  10/17/23 STS x10, then with chest press 3# x10  Side steps over bands on floor  4 way step overs NuStep L5x80mins  Box taps 6" Calf raises 2x10  Standing march 5# w/SPC  10/13/23 Recheck goals after one month of not being here  -STS -BERG -TUG  NuStep L5x44mins  HS curls 25lb 2x10 Leg Ext 5lb 2x10 Walking laps without AD 2 big laps    09/12/23 CHECKED Goals  Updated POC Berg STS TUG walk test and oswestry  08/22/23 NuStep L5 x78mins  HS curls 25lb 2x10 Leg Ext 5lb 2x10 Alt 6in box taps form airex w/ SPC 2x10 S2S LE on airex 2x10 Standing on airex row and ext green band 2x10 Side steps over obstacles CGA  08/18/23 NuStep L5 x47mins  Box taps 6"  Calf raises 2x10 Toe raises 2x10 STS holding yellow ball 2x10 LAQ 5# 2x10  HS curls green 25# 2x10 On airex marching using SPC-  minA  Standing row and ext green band 2x10  08/15/23 NuStep L5x20mins  Resisted gait 20# 4 way x4 CGA  Alt 4 & 6in box taps x10 each STS on airex 2x10  Side step over WaTE 2x5 each HS curls 25# 2x10 Leg ext 10# 2x10  08/04/23 NuStep L5x70mins  5# seated hip flexion, lateral tap on 4" step  5# standing marches w/SPC  Leg ext 10# 2x10 HS curls 25# 2x10 STS on airex 2x10   07/25/23 NuStep L5 x37mins Resisted gait 20# 4 way x4 CGA  Side steps over obstacles CGA Modified sit ups holding yellow ball 2x10  STS holding yellow ball 2x10 Calf raises 2x12 Toe raises 2x12   07/21/23 Assessed and doc goals for renewal Nustep L 5 20# resisted gait 5 x fwd,5 x backward, 3 x each way BERG 39/56   Standing HHA 5# ankle wts stepping over foam roll 2 sets 5 mod A struggled with LLE. Then lateral step 2 set  5 min A Standing HHA 5# ankle wts    07/11/23 NuStep L5 x6mins  Walking on beam Side steps on airex standing by steps  Step ups 6" with hand rail  STS on airex with red ball chest press 2x10 Calf raises 2x10  Calf stretch 15s x2 Toe raises 2x10 Walking without AD 2 big laps       Education details: POC, goals Person educated: Patient Education method: Explanation, Demonstration, Tactile cues, Verbal cues, and Handouts Education comprehension: verbalized understanding, returned demonstration, verbal cues required, and tactile cues required  HOME EXERCISE PROGRAM: Access Code: [email or web address] URL: [email or web address] Date: 05/16/2023 Prepared by: Amy Speaks  Exercises - Supine Lower Trunk Rotation  - 1 x daily - 3 x weekly - 3 sets - 10 reps - Hooklying Single Knee to Chest  - 1 x daily - 3 x weekly - 3 sets - 10 reps - Supine Bridge  - 1 x daily - 3 x weekly - 3 sets - 10 reps  ASSESSMENT:  CLINICAL IMPRESSION:  patient reports pain upon leaving her home to get to her car. States the pain is okay if she sits but it comes and goes. Has one instance of  stumbling with side steps but able to recover independently. Some unsteadiness with big steps over obstacles, but does not lose balance.   Continue to progress functional strength with assistance needed.     OBJECTIVE IMPAIRMENTS: decreased activity tolerance, decreased balance, decreased endurance, difficulty walking, decreased strength, impaired perceived functional ability, postural dysfunction, and pain.   ACTIVITY LIMITATIONS: carrying, lifting, bending, standing, squatting, stairs, transfers, bathing, and locomotion level  PARTICIPATION LIMITATIONS: cleaning and laundry  PERSONAL FACTORS: Age, Behavior pattern, Fitness, Past/current experiences, Time since onset of injury/illness/exacerbation, Transportation, and 3+ comorbidities: h/o brain tumor, visual loss , DM  are also affecting patient's functional outcome.   REHAB POTENTIAL: Good  CLINICAL DECISION MAKING: Evolving/moderate complexity  EVALUATION COMPLEXITY: Moderate   GOALS: Goals reviewed with patient? Yes  SHORT TERM GOALS: Target date: 2 weeks   I HEP Baseline:initiated at eval Goal status: 06/16/23 MET   LONG TERM GOALS: Target date: 12/01/23  30 sec sit to stand 11 Baseline: 8 Goal status: 06/23/23  progressing 10 x from mat without UE 07/07/23 10 x mat without UE progressing 07/21/23 10x ,11 x in 33.4 sec 09/12/23 ongoing 8 reps in 30 seconds  10/13/23-- 11 reps MET  2.  TUG improve to 14 or less Baseline: 20.15 Goal status:  06/23/23 10.33 sec MET no AD  3.  2 min walk test, 250' or greater Baseline: walked 90 ' in one min and had to stop limited by pain Goal status: 06/23/23 progressing walked 200 feet in 90 sec with SPC- SOB then walked without AD 100 feet 43 sec then stopped d/t weakness and increased lateral sway  10/10 /24 amb 200 feet without AD slight limp on RT 1 min 28 sec progressing 07/21/23 2 big laps in gym 200 feet no AD 1 min 30 sec- decreased control with rt step,lands hard on RT esp as she  fatigues. SPC 1 min 10 sec with smoother stride and better control of RT LE.  Met 09/12/23 28ft w/ SPC 2.5 laps around the gym  4.  Modified oswestry 36/50 Baseline: 26/50 Goal status: Ongoing 25/50 09/12/23, 29/50 ongoing 10/13/23  5. Patient will improve BERG score to 45/56 or better   Baseline: 39/56  Goal status: 44/56 09/12/23 Progressing   10/13/23- MET 46/56  6. Patient  will demonstrate improved gait and be able to walk 163ft with decreased limp and no cane  Baseline: "I want to walk straighter but I feel like I lean forward because of pain"  Goal status: progressing 10/17/23    PLAN:  PT FREQUENCY: 2x/week  PT DURATION: 8 weeks  PLANNED INTERVENTIONS: Therapeutic exercises, Therapeutic activity, Neuromuscular re-education, Balance training, Gait training, Patient/Family education, Self Care, and Joint mobilization.  PLAN FOR NEXT SESSION: functional strengthening, LE balance and pain management    Donavon Fudge, PT,  11/07/2023, 11:33 AM [facility] [address] [place], [place], [address] Phone: [phone]   Fax:  [phone] [facility] [address] [place], [place], [address] Phone: [phone]   Fax:  [phone]  Patient Details  Name: ZACARA EUN MRN: [id number] Date of Birth: Feb 20, 1956 Referring Provider:  Gearl Keens, MD  Encounter Date: 11/07/2023   Donavon Fudge, PT 11/07/2023, 11:33 AM  [facility] [address] [place], [place], [address] Phone: [phone]   Fax:  [phone]

## 2023-11-10 ENCOUNTER — Ambulatory Visit: Payer: Medicare Other | Admitting: Physical Therapy

## 2023-11-10 DIAGNOSIS — M6281 Muscle weakness (generalized): Secondary | ICD-10-CM

## 2023-11-10 DIAGNOSIS — R262 Difficulty in walking, not elsewhere classified: Secondary | ICD-10-CM

## 2023-11-10 DIAGNOSIS — R2681 Unsteadiness on feet: Secondary | ICD-10-CM

## 2023-11-10 DIAGNOSIS — R278 Other lack of coordination: Secondary | ICD-10-CM

## 2023-11-10 NOTE — Therapy (Signed)
OUTPATIENT PHYSICAL TREATMENT  Patient Name: Olivia Shah MRN: [id number] DOB:[birth year], 68 y.o., female Today's Date: 11/10/2023  END OF SESSION:  PT End of Session - 11/10/23 1657     Visit Number 26    Date for PT Re-Evaluation 12/01/23    PT Start Time 1700    PT Stop Time 1745    PT Time Calculation (min) 45 min                            Past Medical History:  Diagnosis Date   Arthritis    Diabetes mellitus without complication (HCC)    Hypertension    Past Surgical History:  Procedure Laterality Date   APPLICATION OF CRANIAL NAVIGATION N/A 05/18/2021   Procedure: APPLICATION OF CRANIAL NAVIGATION;  Surgeon: Jadene Pierini, MD;  Location: [facility] OR;  Service: Neurosurgery;  Laterality: N/A;   BREAST BIOPSY Right    CHOLECYSTECTOMY     CRANIOTOMY Right 05/18/2021   Procedure: Right craniotomy for tumor resection;  Surgeon: Jadene Pierini, MD;  Location: [facility] OR;  Service: Neurosurgery;  Laterality: Right;   EYE SURGERY     as a child   HERNIA REPAIR     Patient Active Problem List   Diagnosis Date Noted   Abnormal findings on diagnostic imaging of other specified body structures 01/14/2022   Chronic pain 01/14/2022   Colon cancer screening 01/14/2022   Dysphagia 01/14/2022   Family history of malignant neoplasm of digestive organs 01/14/2022   Hyperglycemia due to type 2 diabetes mellitus (HCC) 01/14/2022   Hypertension 01/14/2022   Meralgia paresthetica 01/14/2022   Mixed hyperlipidemia 01/14/2022   Morbid obesity (HCC) 01/14/2022   Overweight 01/14/2022   Pure hypercholesterolemia 01/14/2022   Spinal cord disease (HCC) 01/14/2022   Chronic cough 12/01/2021   Diabetes (HCC) 07/08/2021   Glaucoma 07/08/2021   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   Visual field defect of left eye 07/08/2021   Debility 06/16/2021   Hypokalemia    Seizures (HCC)    Benign essential HTN    Controlled type 2 diabetes mellitus with hyperglycemia, without  long-term current use of insulin (HCC)    Stridor 06/05/2021   Status post tracheostomy (HCC)    Acute respiratory failure with hypoxia (HCC)    Status epilepticus (HCC)    Brain tumor (HCC) 05/18/2021    PCP: Renford Dills, MD  REFERRING PROVIDER: Donalee Citrin, MD  REFERRING DIAG: LS spinal stenosis with neurogenic claudication  Rationale for Evaluation and Treatment: Rehabilitation  THERAPY DIAG:  Other lack of coordination  Muscle weakness (generalized)  Difficulty in walking, not elsewhere classified  Unsteadiness on feet  ONSET DATE: chronic  SUBJECTIVE:  SUBJECTIVE STATEMENT: Saw MD and he is ordering another MRI " look for blood" feels leg pain is from stenosis. MD states continue with PT as it is very helpful  PERTINENT HISTORY:  The patient has history of chronic lower back pain, participated in PT over 4 months ago for similar problem. Reports increased Sx and progression now of pain to ant thighs , was B post thighs and LB.  Referred to PT by neurologist  PAIN:  Are you having pain RT knee/thigh 3/10  PRECAUTIONS: Fall  RED FLAGS: None   WEIGHT BEARING RESTRICTIONS: No  FALLS:  Has patient fallen in last 6 months? No  LIVING ENVIRONMENT: Lives with: lives alone Lives in: House/apartment Stairs:  ramp Has following equipment at home: Single point cane, Environmental consultant - 2 wheeled, Environmental consultant - 4 wheeled, and Ramped entry  OCCUPATION: works in a church 4 days a week  PLOF: Independent with household mobility with device, Needs assistance with ADLs, and Needs assistance with homemaking  PATIENT GOALS: reduce pain for better standing tolerance  NEXT MD VISIT: unclear  OBJECTIVE:   DIAGNOSTIC FINDINGS:  na  PATIENT SURVEYS:  Modified Oswestry 26/50   SCREENING FOR RED  FLAGS: Bowel or bladder incontinence: No Spinal tumors: No Cauda equina syndrome: No Compression fracture: No Abdominal aneurysm: No  COGNITION: Overall cognitive status: Within functional limits for tasks assessed     SENSATION: Light touch intact B LE's today  MUSCLE LENGTH: Hamstrings: Right wfl deg; Left wfl deg Maisie Fus test: Right wfl deg; Left wfl deg  POSTURE:  narrowed stance B, trendelenburg lurch B, with increased sway with gait, increased lumbar lordosis  PALPATION: Non tender with palpation lumbar paraspinals and B post hip musculature  LUMBAR ROM:   AROM eval  Flexion 80%  Extension 50%  Right lateral flexion 50  Left lateral flexion 50  Right rotation   Left rotation    (Blank rows = not tested)  LOWER EXTREMITY ROM:   all LE AAROM WNL B LOWER EXTREMITY MMT:    MMT Right eval Left eval  Hip flexion 3 3-  Hip extension 3+ 3+  Hip abduction 3 3-  Hip adduction    Hip internal rotation    Hip external rotation    Knee flexion    Knee extension 4 4  Ankle dorsiflexion    Ankle plantarflexion 4- 4-  Ankle inversion    Ankle eversion     (Blank rows = not tested)  LUMBAR SPECIAL TESTS:  Straight leg raise test: Negative and FABER test: Negative  FUNCTIONAL TESTS  30 seconds chair stand test 8 reps Timed up and go (TUG): 20.15 2 minute walk test: 90', 1 min 10 sec, stopped due to B thigh pain  GAIT: Distance walked: 5' Assistive device utilized: Single point cane Level of assistance: Modified independence Comments: fatigued quickly, increased trunk sway and decreased hip control with fatigue  TODAY'S TREATMENT:  DATE:  11/10/23 Amb 3 laps no AD about 350 feet 2 min 15 sec. 3 rd lap very SOB and pain in HS. 1 LOB as fatigued did not clear foot Seated rest then 2 laps 225 feet 1 min 30 sec no LOB Green tband seated  trunk flex and ext 20 x Green tband trunk rotation 15 x each 6 inch step ups with BIL UE ( some pull) 10 x each STS with wt ball chest press 10 x NuStep L5x49mins 30# resisted gait 5x fwd, 3 x each side   11/07/23 NuStep L5x54mins Side steps over bands on floor then forward steps trying to focus on big steps  Leg ext 5# 2x12  HS curls 20# 2x12  STS 2x10   Step ups 4" by stairs x10 each side    11/03/23 Nustep L 5 7 min Alt step tap 20 x 6 inch 2 x with 3 # ankle wts 3# HHA marching fwd and backwards 20 feet 2 x each, then side stepping 3# ankle wts HHA alt LE SL hip flex,ext and abd 20 x Black tband trunk flex and ext 20 x each HS curls 25lb 2x10 Leg Ext 5lb 2x10 Worked on amb level surface without AD working on cadeance  and balance with cuing to pick feet up, trendelenburg walking   10/31/23 NuStep L5x10mins Calf raises 2x10 Toe raises 2x10 Calf stretch on slant board  Leg ext 5# 2x10 Green band HS curls 2x10 Walking 2 big laps no breaks  Pball stretches forwards x10  10/27/23 NuStep L5x32mins  5# hip flexion with SPC Seated tapping to the side onto 6" step w/5# 2x10 Supine bridges x10 LTR x10 Supine LE stretching  Feet on pball rotations and knees to chest  STS w/chest press yellow ball 2x8   10/24/23 NuStep L5 x25mins  S2S w/ chest press yellow ball 2x10 Alt 4in box taps, then 6in x 10 each  Alt 6in box taps from airex 2x5 Sides steps over objects  Calf raises 2x10  Shoulder Ext 5lb 2x10 Seated rows blue 2x15 Seated hip abduction bloe 2x15  10/17/23 STS x10, then with chest press 3# x10  Side steps over bands on floor  4 way step overs NuStep L5x42mins  Box taps 6" Calf raises 2x10  Standing march 5# w/SPC  10/13/23 Recheck goals after one month of not being here  -STS -BERG -TUG  NuStep L5x46mins  HS curls 25lb 2x10 Leg Ext 5lb 2x10 Walking laps without AD 2 big laps    09/12/23 CHECKED Goals  Updated POC Berg STS TUG walk test and  oswestry  08/22/23 NuStep L5 x102mins  HS curls 25lb 2x10 Leg Ext 5lb 2x10 Alt 6in box taps form airex w/ SPC 2x10 S2S LE on airex 2x10 Standing on airex row and ext green band 2x10 Side steps over obstacles CGA  08/18/23 NuStep L5 x13mins  Box taps 6"  Calf raises 2x10 Toe raises 2x10 STS holding yellow ball 2x10 LAQ 5# 2x10  HS curls green 25# 2x10 On airex marching using SPC- minA  Standing row and ext green band 2x10  08/15/23 NuStep L5x24mins  Resisted gait 20# 4 way x4 CGA  Alt 4 & 6in box taps x10 each STS on airex 2x10  Side step over WaTE 2x5 each HS curls 25# 2x10 Leg ext 10# 2x10  08/04/23 NuStep L5x42mins  5# seated hip flexion, lateral tap on 4" step  5# standing marches w/SPC  Leg ext 10# 2x10 HS curls 25#  2x10 STS on airex 2x10   07/25/23 NuStep L5 x32mins Resisted gait 20# 4 way x4 CGA  Side steps over obstacles CGA Modified sit ups holding yellow ball 2x10  STS holding yellow ball 2x10 Calf raises 2x12 Toe raises 2x12   07/21/23 Assessed and doc goals for renewal Nustep L 5 20# resisted gait 5 x fwd,5 x backward, 3 x each way BERG 39/56   Standing HHA 5# ankle wts stepping over foam roll 2 sets 5 mod A struggled with LLE. Then lateral step 2 set 5 min A Standing HHA 5# ankle wts    07/11/23 NuStep L5 x3mins  Walking on beam Side steps on airex standing by steps  Step ups 6" with hand rail  STS on airex with red ball chest press 2x10 Calf raises 2x10  Calf stretch 15s x2 Toe raises 2x10 Walking without AD 2 big laps       Education details: POC, goals Person educated: Patient Education method: Explanation, Demonstration, Tactile cues, Verbal cues, and Handouts Education comprehension: verbalized understanding, returned demonstration, verbal cues required, and tactile cues required  HOME EXERCISE PROGRAM: Access Code: [email or web address] URL: [email or web address] Date: 05/16/2023 Prepared by: Amy Speaks  Exercises -  Supine Lower Trunk Rotation  - 1 x daily - 3 x weekly - 3 sets - 10 reps - Hooklying Single Knee to Chest  - 1 x daily - 3 x weekly - 3 sets - 10 reps - Supine Bridge  - 1 x daily - 3 x weekly - 3 sets - 10 reps  ASSESSMENT:  CLINICAL IMPRESSION:  pt arrives stating she saw MD and he wants to repeat MRI to make sure " not blood" feels leg pain is stenosis . Worked on gait for stamina and she was very SOB with 1 LOB as she fatigues starts going faster and does not always clear feet. Step ups are hard for pt and she uses hard to pull with. Goals assessed and documented.    OBJECTIVE IMPAIRMENTS: decreased activity tolerance, decreased balance, decreased endurance, difficulty walking, decreased strength, impaired perceived functional ability, postural dysfunction, and pain.   ACTIVITY LIMITATIONS: carrying, lifting, bending, standing, squatting, stairs, transfers, bathing, and locomotion level  PARTICIPATION LIMITATIONS: cleaning and laundry  PERSONAL FACTORS: Age, Behavior pattern, Fitness, Past/current experiences, Time since onset of injury/illness/exacerbation, Transportation, and 3+ comorbidities: h/o brain tumor, visual loss , DM  are also affecting patient's functional outcome.   REHAB POTENTIAL: Good  CLINICAL DECISION MAKING: Evolving/moderate complexity  EVALUATION COMPLEXITY: Moderate   GOALS: Goals reviewed with patient? Yes  SHORT TERM GOALS: Target date: 2 weeks   I HEP Baseline:initiated at eval Goal status: 06/16/23 MET   LONG TERM GOALS: Target date: 12/01/23  30 sec sit to stand 11 Baseline: 8 Goal status: 06/23/23  progressing 10 x from mat without UE 07/07/23 10 x mat without UE progressing 07/21/23 10x ,11 x in 33.4 sec 09/12/23 ongoing 8 reps in 30 seconds  10/13/23-- 11 reps MET  2.  TUG improve to 14 or less Baseline: 20.15 Goal status:  06/23/23 10.33 sec MET no AD  3.  2 min walk test, 250' or greater Baseline: walked 90 ' in one min and had to stop  limited by pain Goal status: 06/23/23 progressing walked 200 feet in 90 sec with SPC- SOB then walked without AD 100 feet 43 sec then stopped d/t weakness and increased lateral sway  10/10 /24 amb 200 feet without AD slight limp on RT 1 min  28 sec progressing 07/21/23 2 big laps in gym 200 feet no AD 1 min 30 sec- decreased control with rt step,lands hard on RT esp as she fatigues. SPC 1 min 10 sec with smoother stride and better control of RT LE.  Met 09/12/23 237ft w/ SPC 2.5 laps around the gym  4.  Modified oswestry 36/50 Baseline: 26/50 Goal status: Ongoing 25/50 09/12/23, 29/50 ongoing 10/13/23  11/10/23 progresing  5. Patient will improve BERG score to 45/56 or better   Baseline: 39/56  Goal status: 44/56 09/12/23 Progressing   10/13/23- MET 46/56  6. Patient will demonstrate improved gait and be able to walk 139ft with decreased limp and no cane  Baseline: "I want to walk straighter but I feel like I lean forward because of pain"  Goal status: progressing 10/17/23    progressing 11/10/23   PLAN:  PT FREQUENCY: 2x/week  PT DURATION: 8 weeks  PLANNED INTERVENTIONS: Therapeutic exercises, Therapeutic activity, Neuromuscular re-education, Balance training, Gait training, Patient/Family education, Self Care, and Joint mobilization.  PLAN FOR NEXT SESSION: functional strengthening, LE balance and pain management    Jaira Canady,ANGIE, PTA,  11/10/2023, 4:57 PM [place] [facility] at [facility] [address] [facility] [address] [place], [place], [address] Phone: [phone]   Fax:  [phone] [facility] at [facility] [address] [place], [place], [address] Phone: [phone]   Fax:  [phone]  Patient Details  Name: JURNIE GARRITANO MRN: [id number] Date of Birth: Jun 21, 1956 Referring Provider:  Donalee Citrin, MD  Encounter Date: 11/10/2023   Suanne Marker, PTA 11/10/2023, 4:57 PM  [place] [facility] at [facility] [address] [place], [place], [address] Phone: [phone]   Fax:  [phone] at [facility] [address] [place], [place], [address] Phone: [phone]   Fax:  [phone]  Patient Details  Name: JUNI GLAAB MRN: [id number] Date of Birth: 05-Nov-1955 Referring Provider:  Donalee Citrin, MD  Encounter Date: 11/10/2023   Suanne Marker, PTA 11/10/2023, 4:57 PM  [place] [facility] at [facility] [address] [place], [place], [address] Phone: [phone]   Fax:  [phone]

## 2023-11-11 NOTE — Therapy (Signed)
 OUTPATIENT PHYSICAL TREATMENT  Patient Name: WANIA LONGSTRETH MRN: [id number] DOB:1956-02-16, 68 y.o., female Today's Date: 11/14/2023  END OF SESSION:  PT End of Session - 11/14/23 1055     Visit Number 27    Date for PT Re-Evaluation 12/01/23    PT Start Time 1055    PT Stop Time 1140    PT Time Calculation (min) 45 min    Activity Tolerance Patient limited by pain                             Past Medical History:  Diagnosis Date   Arthritis    Diabetes mellitus without complication (HCC)    Hypertension    Past Surgical History:  Procedure Laterality Date   APPLICATION OF CRANIAL NAVIGATION N/A 05/18/2021   Procedure: APPLICATION OF CRANIAL NAVIGATION;  Surgeon: Jadene Pierini, MD;  Location: [facility] OR;  Service: Neurosurgery;  Laterality: N/A;   BREAST BIOPSY Right    CHOLECYSTECTOMY     CRANIOTOMY Right 05/18/2021   Procedure: Right craniotomy for tumor resection;  Surgeon: Jadene Pierini, MD;  Location: [facility] OR;  Service: Neurosurgery;  Laterality: Right;   EYE SURGERY     as a child   HERNIA REPAIR     Patient Active Problem List   Diagnosis Date Noted   Abnormal findings on diagnostic imaging of other specified body structures 01/14/2022   Chronic pain 01/14/2022   Colon cancer screening 01/14/2022   Dysphagia 01/14/2022   Family history of malignant neoplasm of digestive organs 01/14/2022   Hyperglycemia due to type 2 diabetes mellitus (HCC) 01/14/2022   Hypertension 01/14/2022   Meralgia paresthetica 01/14/2022   Mixed hyperlipidemia 01/14/2022   Morbid obesity (HCC) 01/14/2022   Overweight 01/14/2022   Pure hypercholesterolemia 01/14/2022   Spinal cord disease (HCC) 01/14/2022   Chronic cough 12/01/2021   Diabetes (HCC) 07/08/2021   Glaucoma 07/08/2021   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   Visual field defect of left eye 07/08/2021   Debility 06/16/2021   Hypokalemia    Seizures (HCC)    Benign essential HTN    Controlled type 2  diabetes mellitus with hyperglycemia, without long-term current use of insulin (HCC)    Stridor 06/05/2021   Status post tracheostomy (HCC)    Acute respiratory failure with hypoxia (HCC)    Status epilepticus (HCC)    Brain tumor (HCC) 05/18/2021    PCP: Renford Dills, MD  REFERRING PROVIDER: Donalee Citrin, MD  REFERRING DIAG: LS spinal stenosis with neurogenic claudication  Rationale for Evaluation and Treatment: Rehabilitation  THERAPY DIAG:  Other lack of coordination  Difficulty in walking, not elsewhere classified  Muscle weakness (generalized)  Unsteadiness on feet  ONSET DATE: chronic  SUBJECTIVE:  SUBJECTIVE STATEMENT: This leg is not doing good, it was the L one when I left the house now it is the R one. My sister told me to take something for pain before I came. So I took 2 Tylenols. I usually feel better when I leave from here.  8/10 on pain scale   PERTINENT HISTORY:  The patient has history of chronic lower back pain, participated in PT over 4 months ago for similar problem. Reports increased Sx and progression now of pain to ant thighs , was B post thighs and LB.  Referred to PT by neurologist  PAIN:  Are you having pain RT knee/thigh 8/10  PRECAUTIONS: Fall  RED FLAGS: None   WEIGHT BEARING RESTRICTIONS: No  FALLS:  Has patient fallen in last 6 months? No  LIVING ENVIRONMENT: Lives with: lives alone Lives in: House/apartment Stairs:  ramp Has following equipment at home: Single point cane, Environmental consultant - 2 wheeled, Environmental consultant - 4 wheeled, and Ramped entry  OCCUPATION: works in a church 4 days a week  PLOF: Independent with household mobility with device, Needs assistance with ADLs, and Needs assistance with homemaking  PATIENT GOALS: reduce pain for better standing  tolerance  NEXT MD VISIT: unclear  OBJECTIVE:   DIAGNOSTIC FINDINGS:  na  PATIENT SURVEYS:  Modified Oswestry 26/50   SCREENING FOR RED FLAGS: Bowel or bladder incontinence: No Spinal tumors: No Cauda equina syndrome: No Compression fracture: No Abdominal aneurysm: No  COGNITION: Overall cognitive status: Within functional limits for tasks assessed     SENSATION: Light touch intact B LE's today  MUSCLE LENGTH: Hamstrings: Right wfl deg; Left wfl deg Maisie Fus test: Right wfl deg; Left wfl deg  POSTURE:  narrowed stance B, trendelenburg lurch B, with increased sway with gait, increased lumbar lordosis  PALPATION: Non tender with palpation lumbar paraspinals and B post hip musculature  LUMBAR ROM:   AROM eval  Flexion 80%  Extension 50%  Right lateral flexion 50  Left lateral flexion 50  Right rotation   Left rotation    (Blank rows = not tested)  LOWER EXTREMITY ROM:   all LE AAROM WNL B LOWER EXTREMITY MMT:    MMT Right eval Left eval  Hip flexion 3 3-  Hip extension 3+ 3+  Hip abduction 3 3-  Hip adduction    Hip internal rotation    Hip external rotation    Knee flexion    Knee extension 4 4  Ankle dorsiflexion    Ankle plantarflexion 4- 4-  Ankle inversion    Ankle eversion     (Blank rows = not tested)  LUMBAR SPECIAL TESTS:  Straight leg raise test: Negative and FABER test: Negative  FUNCTIONAL TESTS  30 seconds chair stand test 8 reps Timed up and go (TUG): 20.15 2 minute walk test: 90', 1 min 10 sec, stopped due to B thigh pain  GAIT: Distance walked: 84' Assistive device utilized: Single point cane Level of assistance: Modified independence Comments: fatigued quickly, increased trunk sway and decreased hip control with fatigue  TODAY'S TREATMENT:  DATE: 11/14/23 NuStep L5 x40mins  Fitter pushes 1 black and 1  blue band 2x10 each side  Side steps along mat table 3x down and back Walking laps 2 laps with SPC  STS 2x8   11/10/23 Amb 3 laps no AD about 350 feet 2 min 15 sec. 3 rd lap very SOB and pain in HS. 1 LOB as fatigued did not clear foot Seated rest then 2 laps 225 feet 1 min 30 sec no LOB Green tband seated trunk flex and ext 20 x Green tband trunk rotation 15 x each 6 inch step ups with BIL UE ( some pull) 10 x each STS with wt ball chest press 10 x NuStep L5x80mins 30# resisted gait 5x fwd, 3 x each side   11/07/23 NuStep L5x32mins Side steps over bands on floor then forward steps trying to focus on big steps  Leg ext 5# 2x12  HS curls 20# 2x12  STS 2x10   Step ups 4" by stairs x10 each side    11/03/23 Nustep L 5 7 min Alt step tap 20 x 6 inch 2 x with 3 # ankle wts 3# HHA marching fwd and backwards 20 feet 2 x each, then side stepping 3# ankle wts HHA alt LE SL hip flex,ext and abd 20 x Black tband trunk flex and ext 20 x each HS curls 25lb 2x10 Leg Ext 5lb 2x10 Worked on amb level surface without AD working on cadeance  and balance with cuing to pick feet up, trendelenburg walking   10/31/23 NuStep L5x55mins Calf raises 2x10 Toe raises 2x10 Calf stretch on slant board  Leg ext 5# 2x10 Green band HS curls 2x10 Walking 2 big laps no breaks  Pball stretches forwards x10  10/27/23 NuStep L5x26mins  5# hip flexion with SPC Seated tapping to the side onto 6" step w/5# 2x10 Supine bridges x10 LTR x10 Supine LE stretching  Feet on pball rotations and knees to chest  STS w/chest press yellow ball 2x8   10/24/23 NuStep L5 x57mins  S2S w/ chest press yellow ball 2x10 Alt 4in box taps, then 6in x 10 each  Alt 6in box taps from airex 2x5 Sides steps over objects  Calf raises 2x10  Shoulder Ext 5lb 2x10 Seated rows blue 2x15 Seated hip abduction bloe 2x15  10/17/23 STS x10, then with chest press 3# x10  Side steps over bands on floor  4 way step overs NuStep L5x69mins   Box taps 6" Calf raises 2x10  Standing march 5# w/SPC  10/13/23 Recheck goals after one month of not being here  -STS -BERG -TUG  NuStep L5x6mins  HS curls 25lb 2x10 Leg Ext 5lb 2x10 Walking laps without AD 2 big laps    09/12/23 CHECKED Goals  Updated POC Berg STS TUG walk test and oswestry  08/22/23 NuStep L5 x74mins  HS curls 25lb 2x10 Leg Ext 5lb 2x10 Alt 6in box taps form airex w/ SPC 2x10 S2S LE on airex 2x10 Standing on airex row and ext green band 2x10 Side steps over obstacles CGA  08/18/23 NuStep L5 x47mins  Box taps 6"  Calf raises 2x10 Toe raises 2x10 STS holding yellow ball 2x10 LAQ 5# 2x10  HS curls green 25# 2x10 On airex marching using SPC- minA  Standing row and ext green band 2x10  08/15/23 NuStep L5x74mins  Resisted gait 20# 4 way x4 CGA  Alt 4 & 6in box taps x10 each STS on airex 2x10  Side step over WaTE 2x5  each HS curls 25# 2x10 Leg ext 10# 2x10  08/04/23 NuStep L5x52mins  5# seated hip flexion, lateral tap on 4" step  5# standing marches w/SPC  Leg ext 10# 2x10 HS curls 25# 2x10 STS on airex 2x10   07/25/23 NuStep L5 x55mins Resisted gait 20# 4 way x4 CGA  Side steps over obstacles CGA Modified sit ups holding yellow ball 2x10  STS holding yellow ball 2x10 Calf raises 2x12 Toe raises 2x12   07/21/23 Assessed and doc goals for renewal Nustep L 5 20# resisted gait 5 x fwd,5 x backward, 3 x each way BERG 39/56   Standing HHA 5# ankle wts stepping over foam roll 2 sets 5 mod A struggled with LLE. Then lateral step 2 set 5 min A Standing HHA 5# ankle wts    07/11/23 NuStep L5 x39mins  Walking on beam Side steps on airex standing by steps  Step ups 6" with hand rail  STS on airex with red ball chest press 2x10 Calf raises 2x10  Calf stretch 15s x2 Toe raises 2x10 Walking without AD 2 big laps       Education details: POC, goals Person educated: Patient Education method: Explanation, Demonstration,  Tactile cues, Verbal cues, and Handouts Education comprehension: verbalized understanding, returned demonstration, verbal cues required, and tactile cues required  HOME EXERCISE PROGRAM: Access Code: [email or web address] URL: [email or web address] Date: 05/16/2023 Prepared by: Amy Speaks  Exercises - Supine Lower Trunk Rotation  - 1 x daily - 3 x weekly - 3 sets - 10 reps - Hooklying Single Knee to Chest  - 1 x daily - 3 x weekly - 3 sets - 10 reps - Supine Bridge  - 1 x daily - 3 x weekly - 3 sets - 10 reps  ASSESSMENT:  CLINICAL IMPRESSION:  Pt arrives with pain in RLE which she reports started when she left to come to therapy. Worked on some strengthening and endurance training. She seems to be a little more unsteady and flexed at the hips today. Pain was a limiting factor in session. Walking today was harder due to pain so we used SPC to complete. Long break needed during session for restroom break.     OBJECTIVE IMPAIRMENTS: decreased activity tolerance, decreased balance, decreased endurance, difficulty walking, decreased strength, impaired perceived functional ability, postural dysfunction, and pain.   ACTIVITY LIMITATIONS: carrying, lifting, bending, standing, squatting, stairs, transfers, bathing, and locomotion level  PARTICIPATION LIMITATIONS: cleaning and laundry  PERSONAL FACTORS: Age, Behavior pattern, Fitness, Past/current experiences, Time since onset of injury/illness/exacerbation, Transportation, and 3+ comorbidities: h/o brain tumor, visual loss , DM  are also affecting patient's functional outcome.   REHAB POTENTIAL: Good  CLINICAL DECISION MAKING: Evolving/moderate complexity  EVALUATION COMPLEXITY: Moderate   GOALS: Goals reviewed with patient? Yes  SHORT TERM GOALS: Target date: 2 weeks   I HEP Baseline:initiated at eval Goal status: 06/16/23 MET   LONG TERM GOALS: Target date: 12/01/23  30 sec sit to stand 11 Baseline: 8 Goal status: 06/23/23   progressing 10 x from mat without UE 07/07/23 10 x mat without UE progressing 07/21/23 10x ,11 x in 33.4 sec 09/12/23 ongoing 8 reps in 30 seconds  10/13/23-- 11 reps MET  2.  TUG improve to 14 or less Baseline: 20.15 Goal status:  06/23/23 10.33 sec MET no AD  3.  2 min walk test, 250' or greater Baseline: walked 90 ' in one min and had to stop limited by pain Goal status: 06/23/23 progressing walked 200  feet in 90 sec with SPC- SOB then walked without AD 100 feet 43 sec then stopped d/t weakness and increased lateral sway  10/10 /24 amb 200 feet without AD slight limp on RT 1 min 28 sec progressing 07/21/23 2 big laps in gym 200 feet no AD 1 min 30 sec- decreased control with rt step,lands hard on RT esp as she fatigues. SPC 1 min 10 sec with smoother stride and better control of RT LE.  Met 09/12/23 265ft w/ SPC 2.5 laps around the gym  4.  Modified oswestry 36/50 Baseline: 26/50 Goal status: Ongoing 25/50 09/12/23, 29/50 ongoing 10/13/23  11/10/23 progresing  5. Patient will improve BERG score to 45/56 or better   Baseline: 39/56  Goal status: 44/56 09/12/23 Progressing   10/13/23- MET 46/56  6. Patient will demonstrate improved gait and be able to walk 180ft with decreased limp and no cane  Baseline: "I want to walk straighter but I feel like I lean forward because of pain"  Goal status: progressing 10/17/23    progressing 11/10/23   PLAN:  PT FREQUENCY: 2x/week  PT DURATION: 8 weeks  PLANNED INTERVENTIONS: Therapeutic exercises, Therapeutic activity, Neuromuscular re-education, Balance training, Gait training, Patient/Family education, Self Care, and Joint mobilization.  PLAN FOR NEXT SESSION: functional strengthening, LE balance and pain management    Cassie Freer, PT,  11/14/2023, 11:34 AM [facility] Outpatient Rehabilitation at [facility] [address] [facility] [place], [place], [address] Phone: [phone]   Fax:  [phone] [facility]  Outpatient Rehabilitation at [facility] [address] [place], [place], [address] Phone: [phone]   Fax:  [phone]  Patient Details  Name: SHERMAINE RIVET MRN: [id number] Date of Birth: [birth year] Referring Provider:  Donalee Citrin, MD  Encounter Date: 11/14/2023   Cassie Freer, PT 11/14/2023, 11:34 AM  [facility] at [facility] [address] [place], [place], [address] Phone: [phone]   Fax:  [phone] Out[facility] Rehabilitation at [facility] [address] [facility] [address] [place], [place], [address] Phone: [phone]   Fax:  [phone]  Patient Details  Name: JAZZMAN LOUGHMILLER MRN: [id number] Date of Birth: [birth year] Referring Provider:  Donalee Citrin, MD  Encounter Date: 11/14/2023   Cassie Freer, PT 11/14/2023, 11:34 AM    Outpatient Rehabilitation at [facility] [address] [place], [place], [address] Phone: [phone]   Fax:  [phone]

## 2023-11-14 ENCOUNTER — Ambulatory Visit: Payer: Medicare Other

## 2023-11-14 DIAGNOSIS — R278 Other lack of coordination: Secondary | ICD-10-CM

## 2023-11-14 DIAGNOSIS — M6281 Muscle weakness (generalized): Secondary | ICD-10-CM | POA: Diagnosis not present

## 2023-11-14 DIAGNOSIS — R2681 Unsteadiness on feet: Secondary | ICD-10-CM

## 2023-11-14 DIAGNOSIS — R262 Difficulty in walking, not elsewhere classified: Secondary | ICD-10-CM

## 2023-11-17 ENCOUNTER — Ambulatory Visit: Payer: Medicare Other | Admitting: Physical Therapy

## 2023-11-18 NOTE — Therapy (Signed)
 OUTPATIENT PHYSICAL TREATMENT  Patient Name: Olivia Shah MRN: [id number] DOB:05-Jun-1956, 68 y.o., female Today's Date: 11/21/2023  END OF SESSION:  PT End of Session - 11/21/23 1055     Visit Number 28    Date for PT Re-Evaluation 12/01/23    PT Start Time 1055    PT Stop Time 1140    PT Time Calculation (min) 45 min    Activity Tolerance Patient limited by pain                              Past Medical History:  Diagnosis Date   Arthritis    Diabetes mellitus without complication (HCC)    Hypertension    Past Surgical History:  Procedure Laterality Date   APPLICATION OF CRANIAL NAVIGATION N/A 05/18/2021   Procedure: APPLICATION OF CRANIAL NAVIGATION;  Surgeon: Jadene Pierini, MD;  Location: [facility] OR;  Service: Neurosurgery;  Laterality: N/A;   BREAST BIOPSY Right    CHOLECYSTECTOMY     CRANIOTOMY Right 05/18/2021   Procedure: Right craniotomy for tumor resection;  Surgeon: Jadene Pierini, MD;  Location: [facility] OR;  Service: Neurosurgery;  Laterality: Right;   EYE SURGERY     as a child   HERNIA REPAIR     Patient Active Problem List   Diagnosis Date Noted   Abnormal findings on diagnostic imaging of other specified body structures 01/14/2022   Chronic pain 01/14/2022   Colon cancer screening 01/14/2022   Dysphagia 01/14/2022   Family history of malignant neoplasm of digestive organs 01/14/2022   Hyperglycemia due to type 2 diabetes mellitus (HCC) 01/14/2022   Hypertension 01/14/2022   Meralgia paresthetica 01/14/2022   Mixed hyperlipidemia 01/14/2022   Morbid obesity (HCC) 01/14/2022   Overweight 01/14/2022   Pure hypercholesterolemia 01/14/2022   Spinal cord disease (HCC) 01/14/2022   Chronic cough 12/01/2021   Diabetes (HCC) 07/08/2021   Glaucoma 07/08/2021   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   Visual field defect of left eye 07/08/2021   Debility 06/16/2021   Hypokalemia    Seizures (HCC)    Benign essential HTN    Controlled type 2  diabetes mellitus with hyperglycemia, without long-term current use of insulin (HCC)    Stridor 06/05/2021   Status post tracheostomy (HCC)    Acute respiratory failure with hypoxia (HCC)    Status epilepticus (HCC)    Brain tumor (HCC) 05/18/2021    PCP: Renford Dills, MD  REFERRING PROVIDER: Donalee Citrin, MD  REFERRING DIAG: LS spinal stenosis with neurogenic claudication  Rationale for Evaluation and Treatment: Rehabilitation  THERAPY DIAG:  Other lack of coordination  Muscle weakness (generalized)  Difficulty in walking, not elsewhere classified  Unsteadiness on feet  Chronic bilateral low back pain with bilateral sciatica  ONSET DATE: chronic  SUBJECTIVE:  SUBJECTIVE STATEMENT: I was hurting so bad in my legs Thursday and Friday, I had to call the doctor and they sent me in some medicine. It is a steroid (methylprednisolone) that I started Saturday. MRI is scheduled for 12/08/23.   PERTINENT HISTORY:  The patient has history of chronic lower back pain, participated in PT over 4 months ago for similar problem. Reports increased Sx and progression now of pain to ant thighs , was B post thighs and LB.  Referred to PT by neurologist  PAIN:  Are you having pain RT knee/thigh 8/10  PRECAUTIONS: Fall  RED FLAGS: None   WEIGHT BEARING RESTRICTIONS: No  FALLS:  Has patient fallen in last 6 months? No  LIVING ENVIRONMENT: Lives with: lives alone Lives in: House/apartment Stairs:  ramp Has following equipment at home: Single point cane, Environmental consultant - 2 wheeled, Environmental consultant - 4 wheeled, and Ramped entry  OCCUPATION: works in a church 4 days a week  PLOF: Independent with household mobility with device, Needs assistance with ADLs, and Needs assistance with homemaking  PATIENT GOALS: reduce pain  for better standing tolerance  NEXT MD VISIT: unclear  OBJECTIVE:   DIAGNOSTIC FINDINGS:  na  PATIENT SURVEYS:  Modified Oswestry 26/50   SCREENING FOR RED FLAGS: Bowel or bladder incontinence: No Spinal tumors: No Cauda equina syndrome: No Compression fracture: No Abdominal aneurysm: No  COGNITION: Overall cognitive status: Within functional limits for tasks assessed     SENSATION: Light touch intact B LE's today  MUSCLE LENGTH: Hamstrings: Right wfl deg; Left wfl deg Maisie Fus test: Right wfl deg; Left wfl deg  POSTURE:  narrowed stance B, trendelenburg lurch B, with increased sway with gait, increased lumbar lordosis  PALPATION: Non tender with palpation lumbar paraspinals and B post hip musculature  LUMBAR ROM:   AROM eval  Flexion 80%  Extension 50%  Right lateral flexion 50  Left lateral flexion 50  Right rotation   Left rotation    (Blank rows = not tested)  LOWER EXTREMITY ROM:   all LE AAROM WNL B LOWER EXTREMITY MMT:    MMT Right eval Left eval  Hip flexion 3 3-  Hip extension 3+ 3+  Hip abduction 3 3-  Hip adduction    Hip internal rotation    Hip external rotation    Knee flexion    Knee extension 4 4  Ankle dorsiflexion    Ankle plantarflexion 4- 4-  Ankle inversion    Ankle eversion     (Blank rows = not tested)  LUMBAR SPECIAL TESTS:  Straight leg raise test: Negative and FABER test: Negative  FUNCTIONAL TESTS  30 seconds chair stand test 8 reps Timed up and go (TUG): 20.15 2 minute walk test: 90', 1 min 10 sec, stopped due to B thigh pain  GAIT: Distance walked: 69' Assistive device utilized: Single point cane Level of assistance: Modified independence Comments: fatigued quickly, increased trunk sway and decreased hip control with fatigue  TODAY'S TREATMENT:  DATE: 11/21/23 Walking 2 mins w/o AD  Leg  ext 5# 2x10 HS curls 25# 2x10 NuStep L5x14mins Calf raises 2x10 Calf stretch 30s  Standing rows and ext green band 2x10 4" box taps by stairs  Step ups 4" by stairs   11/14/23 NuStep L5 x35mins  Fitter pushes 1 black and 1 blue band 2x10 each side  Side steps along mat table 3x down and back Walking laps 2 laps with SPC  STS 2x8   11/10/23 Amb 3 laps no AD about 350 feet 2 min 15 sec. 3 rd lap very SOB and pain in HS. 1 LOB as fatigued did not clear foot Seated rest then 2 laps 225 feet 1 min 30 sec no LOB Green tband seated trunk flex and ext 20 x Green tband trunk rotation 15 x each 6 inch step ups with BIL UE ( some pull) 10 x each STS with wt ball chest press 10 x NuStep L5x93mins 30# resisted gait 5x fwd, 3 x each side   11/07/23 NuStep L5x83mins Side steps over bands on floor then forward steps trying to focus on big steps  Leg ext 5# 2x12  HS curls 20# 2x12  STS 2x10   Step ups 4" by stairs x10 each side    11/03/23 Nustep L 5 7 min Alt step tap 20 x 6 inch 2 x with 3 # ankle wts 3# HHA marching fwd and backwards 20 feet 2 x each, then side stepping 3# ankle wts HHA alt LE SL hip flex,ext and abd 20 x Black tband trunk flex and ext 20 x each HS curls 25lb 2x10 Leg Ext 5lb 2x10 Worked on amb level surface without AD working on cadeance  and balance with cuing to pick feet up, trendelenburg walking   10/31/23 NuStep L5x80mins Calf raises 2x10 Toe raises 2x10 Calf stretch on slant board  Leg ext 5# 2x10 Green band HS curls 2x10 Walking 2 big laps no breaks  Pball stretches forwards x10  10/27/23 NuStep L5x82mins  5# hip flexion with SPC Seated tapping to the side onto 6" step w/5# 2x10 Supine bridges x10 LTR x10 Supine LE stretching  Feet on pball rotations and knees to chest  STS w/chest press yellow ball 2x8   10/24/23 NuStep L5 x24mins  S2S w/ chest press yellow ball 2x10 Alt 4in box taps, then 6in x 10 each  Alt 6in box taps from airex 2x5 Sides steps  over objects  Calf raises 2x10  Shoulder Ext 5lb 2x10 Seated rows blue 2x15 Seated hip abduction bloe 2x15  10/17/23 STS x10, then with chest press 3# x10  Side steps over bands on floor  4 way step overs NuStep L5x29mins  Box taps 6" Calf raises 2x10  Standing march 5# w/SPC  10/13/23 Recheck goals after one month of not being here  -STS -BERG -TUG  NuStep L5x57mins  HS curls 25lb 2x10 Leg Ext 5lb 2x10 Walking laps without AD 2 big laps    09/12/23 CHECKED Goals  Updated POC Berg STS TUG walk test and oswestry  08/22/23 NuStep L5 x51mins  HS curls 25lb 2x10 Leg Ext 5lb 2x10 Alt 6in box taps form airex w/ SPC 2x10 S2S LE on airex 2x10 Standing on airex row and ext green band 2x10 Side steps over obstacles CGA  08/18/23 NuStep L5 x1mins  Box taps 6"  Calf raises 2x10 Toe raises 2x10 STS holding yellow ball 2x10 LAQ 5# 2x10  HS curls green 25# 2x10 On  airex marching using SPC- minA  Standing row and ext green band 2x10  08/15/23 NuStep L5x43mins  Resisted gait 20# 4 way x4 CGA  Alt 4 & 6in box taps x10 each STS on airex 2x10  Side step over WaTE 2x5 each HS curls 25# 2x10 Leg ext 10# 2x10  08/04/23 NuStep L5x19mins  5# seated hip flexion, lateral tap on 4" step  5# standing marches w/SPC  Leg ext 10# 2x10 HS curls 25# 2x10 STS on airex 2x10   07/25/23 NuStep L5 x48mins Resisted gait 20# 4 way x4 CGA  Side steps over obstacles CGA Modified sit ups holding yellow ball 2x10  STS holding yellow ball 2x10 Calf raises 2x12 Toe raises 2x12   07/21/23 Assessed and doc goals for renewal Nustep L 5 20# resisted gait 5 x fwd,5 x backward, 3 x each way BERG 39/56   Standing HHA 5# ankle wts stepping over foam roll 2 sets 5 mod A struggled with LLE. Then lateral step 2 set 5 min A Standing HHA 5# ankle wts    07/11/23 NuStep L5 x29mins  Walking on beam Side steps on airex standing by steps  Step ups 6" with hand rail  STS on airex with  red ball chest press 2x10 Calf raises 2x10  Calf stretch 15s x2 Toe raises 2x10 Walking without AD 2 big laps       Education details: POC, goals Person educated: Patient Education method: Explanation, Demonstration, Tactile cues, Verbal cues, and Handouts Education comprehension: verbalized understanding, returned demonstration, verbal cues required, and tactile cues required  HOME EXERCISE PROGRAM: Access Code: [email or web address] URL: [email or web address] Date: 05/16/2023 Prepared by: Amy Speaks  Exercises - Supine Lower Trunk Rotation  - 1 x daily - 3 x weekly - 3 sets - 10 reps - Hooklying Single Knee to Chest  - 1 x daily - 3 x weekly - 3 sets - 10 reps - Supine Bridge  - 1 x daily - 3 x weekly - 3 sets - 10 reps  ASSESSMENT:  CLINICAL IMPRESSION:  Pt arrives doing better with pain levels, she is on a new steroid medicine that she called in for due to increased pain levels on Thursday and Friday. She started it Saturday and will be on a taper until Thursday.  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 She was able to tolerate more today due to less pain.    OBJECTIVE IMPAIRMENTS: decreased activity tolerance, decreased balance, decreased endurance, difficulty walking, decreased strength, impaired perceived functional ability, postural dysfunction, and pain.   ACTIVITY LIMITATIONS: carrying, lifting, bending, standing, squatting, stairs, transfers, bathing, and locomotion level  PARTICIPATION LIMITATIONS: cleaning and laundry  PERSONAL FACTORS: Age, Behavior pattern, Fitness, Past/current experiences, Time since onset of injury/illness/exacerbation, Transportation, and 3+ comorbidities: h/o brain tumor, visual loss , DM  are also affecting patient's functional outcome.   REHAB POTENTIAL: Good  CLINICAL DECISION MAKING: Evolving/moderate complexity  EVALUATION COMPLEXITY: Moderate   GOALS: Goals reviewed with patient? Yes  SHORT TERM GOALS:  Target date: 2 weeks   I HEP Baseline:initiated at eval Goal status: 06/16/23 MET   LONG TERM GOALS: Target date: 12/01/23  30 sec sit to stand 11 Baseline: 8 Goal status: 06/23/23  progressing 10 x from mat without UE 07/07/23 10 x mat without UE progressing 07/21/23 10x ,11 x in 33.4 sec 09/12/23 ongoing 8 reps in 30 seconds  10/13/23-- 11 reps MET  2.  TUG improve to 14 or less Baseline: 20.15 Goal  status:  06/23/23 10.33 sec MET no AD  3.  2 min walk test, 250' or greater Baseline: walked 90 ' in one min and had to stop limited by pain Goal status: 06/23/23 progressing walked 200 feet in 90 sec with SPC- SOB then walked without AD 100 feet 43 sec then stopped d/t weakness and increased lateral sway  10/10 /24 amb 200 feet without AD slight limp on RT 1 min 28 sec progressing 07/21/23 2 big laps in gym 200 feet no AD 1 min 30 sec- decreased control with rt step,lands hard on RT esp as she fatigues. SPC 1 min 10 sec with smoother stride and better control of RT LE.  Met 09/12/23 286ft w/ SPC 2.5 laps around the gym  4.  Modified oswestry 36/50 Baseline: 26/50 Goal status: Ongoing 25/50 09/12/23, 29/50 ongoing 10/13/23  11/10/23 progresing  5. Patient will improve BERG score to 45/56 or better   Baseline: 39/56  Goal status: 44/56 09/12/23 Progressing   10/13/23- MET 46/56  6. Patient will demonstrate improved gait and be able to walk 15ft with decreased limp and no cane  Baseline: "I want to walk straighter but I feel like I lean forward because of pain"  Goal status: progressing 10/17/23    progressing 11/10/23   PLAN:  PT FREQUENCY: 2x/week  PT DURATION: 8 weeks  PLANNED INTERVENTIONS: Therapeutic exercises, Therapeutic activity, Neuromuscular re-education, Balance training, Gait training, Patient/Family education, Self Care, and Joint mobilization.  PLAN FOR NEXT SESSION: functional strengthening, LE balance and pain management    Cassie Freer, PT,  11/21/2023, 11:38 AM  [facility] at [facility] [address] [place], [place], [address] Phone: [phone]   Fax:  [phone] [facility] at [place] [address] [place], [place], [address] Phone: [phone]   Fax:  [phone]  Patient Details  Name: CHIDINMA CLITES MRN: [id number] Date of Birth: 31-Jan-1956 Referring Provider:  Donalee Citrin, MD  Encounter Date: 11/21/2023   Cassie Freer, PT 11/21/2023, 11:38 AM  [place] [facility] at [facility] [address] [place], [place], [address] Phone: [phone]   Fax:  [phone] at [facility] [address] [place], [place], [address] Phone: [phone]   Fax:  [phone]  Patient Details  Name: DORIANA MAZURKIEWICZ MRN: [id number] Date of Birth: Jun 09, 1956 Referring Provider:  Donalee Citrin, MD  Encounter Date: 11/21/2023   Cassie Freer, PT 11/21/2023, 11:38 AM  [place] [facility] at [facility] [address] [place], [place], [address] Phone: [phone]   Fax:  [phone]

## 2023-11-21 ENCOUNTER — Ambulatory Visit: Payer: Medicare Other

## 2023-11-21 DIAGNOSIS — G8929 Other chronic pain: Secondary | ICD-10-CM

## 2023-11-21 DIAGNOSIS — M6281 Muscle weakness (generalized): Secondary | ICD-10-CM

## 2023-11-21 DIAGNOSIS — R262 Difficulty in walking, not elsewhere classified: Secondary | ICD-10-CM

## 2023-11-21 DIAGNOSIS — R2681 Unsteadiness on feet: Secondary | ICD-10-CM

## 2023-11-21 DIAGNOSIS — R278 Other lack of coordination: Secondary | ICD-10-CM

## 2023-11-22 ENCOUNTER — Telehealth: Payer: Self-pay | Admitting: Neurology

## 2023-11-22 NOTE — Telephone Encounter (Signed)
 Pt called to verify appointment

## 2023-11-24 ENCOUNTER — Ambulatory Visit: Payer: Medicare Other | Admitting: Physical Therapy

## 2023-11-24 DIAGNOSIS — M6281 Muscle weakness (generalized): Secondary | ICD-10-CM

## 2023-11-24 DIAGNOSIS — R262 Difficulty in walking, not elsewhere classified: Secondary | ICD-10-CM

## 2023-11-24 DIAGNOSIS — R2681 Unsteadiness on feet: Secondary | ICD-10-CM

## 2023-11-24 DIAGNOSIS — G8929 Other chronic pain: Secondary | ICD-10-CM

## 2023-11-24 DIAGNOSIS — R278 Other lack of coordination: Secondary | ICD-10-CM

## 2023-11-24 NOTE — Therapy (Addendum)
 OUTPATIENT PHYSICAL TREATMENT  Patient Name: Olivia Shah MRN: [id number] DOB:1955/12/15, 68 y.o., female Today's Date: 11/24/2023  END OF SESSION:  PT End of Session - 11/24/23 1658     Visit Number 29    Date for PT Re-Evaluation 12/01/23    PT Start Time 1658    PT Stop Time 1745    PT Time Calculation (min) 47 min                              Past Medical History:  Diagnosis Date   Arthritis    Diabetes mellitus without complication (HCC)    Hypertension    Past Surgical History:  Procedure Laterality Date   APPLICATION OF CRANIAL NAVIGATION N/A 05/18/2021   Procedure: APPLICATION OF CRANIAL NAVIGATION;  Surgeon: Jadene Pierini, MD;  Location: [facility] OR;  Service: Neurosurgery;  Laterality: N/A;   BREAST BIOPSY Right    CHOLECYSTECTOMY     CRANIOTOMY Right 05/18/2021   Procedure: Right craniotomy for tumor resection;  Surgeon: Jadene Pierini, MD;  Location: [facility] OR;  Service: Neurosurgery;  Laterality: Right;   EYE SURGERY     as a child   HERNIA REPAIR     Patient Active Problem List   Diagnosis Date Noted   Abnormal findings on diagnostic imaging of other specified body structures 01/14/2022   Chronic pain 01/14/2022   Colon cancer screening 01/14/2022   Dysphagia 01/14/2022   Family history of malignant neoplasm of digestive organs 01/14/2022   Hyperglycemia due to type 2 diabetes mellitus (HCC) 01/14/2022   Hypertension 01/14/2022   Meralgia paresthetica 01/14/2022   Mixed hyperlipidemia 01/14/2022   Morbid obesity (HCC) 01/14/2022   Overweight 01/14/2022   Pure hypercholesterolemia 01/14/2022   Spinal cord disease (HCC) 01/14/2022   Chronic cough 12/01/2021   Diabetes (HCC) 07/08/2021   Glaucoma 07/08/2021   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   Visual field defect of left eye 07/08/2021   Debility 06/16/2021   Hypokalemia    Seizures (HCC)    Benign essential HTN    Controlled type 2 diabetes mellitus with hyperglycemia, without  long-term current use of insulin (HCC)    Stridor 06/05/2021   Status post tracheostomy (HCC)    Acute respiratory failure with hypoxia (HCC)    Status epilepticus (HCC)    Brain tumor (HCC) 05/18/2021    PCP: Renford Dills, MD  REFERRING PROVIDER: Donalee Citrin, MD  REFERRING DIAG: LS spinal stenosis with neurogenic claudication  Rationale for Evaluation and Treatment: Rehabilitation  THERAPY DIAG:  Other lack of coordination  Muscle weakness (generalized)  Difficulty in walking, not elsewhere classified  Unsteadiness on feet  Chronic bilateral low back pain with bilateral sciatica  ONSET DATE: chronic  SUBJECTIVE:  SUBJECTIVE STATEMENT: Steroid helped some, MRI 3/13, then MD 3/27. I am stronger but pain no better PERTINENT HISTORY:  The patient has history of chronic lower back pain, participated in PT over 4 months ago for similar problem. Reports increased Sx and progression now of pain to ant thighs , was B post thighs and LB.  Referred to PT by neurologist  PAIN:  Are you having pain RT knee/thigh 8/10  PRECAUTIONS: Fall  RED FLAGS: None   WEIGHT BEARING RESTRICTIONS: No  FALLS:  Has patient fallen in last 6 months? No  LIVING ENVIRONMENT: Lives with: lives alone Lives in: House/apartment Stairs:  ramp Has following equipment at home: Single point cane, Environmental consultant - 2 wheeled, Environmental consultant - 4 wheeled, and Ramped entry  OCCUPATION: works in a church 4 days a week  PLOF: Independent with household mobility with device, Needs assistance with ADLs, and Needs assistance with homemaking  PATIENT GOALS: reduce pain for better standing tolerance  NEXT MD VISIT: unclear  OBJECTIVE:   DIAGNOSTIC FINDINGS:  na  PATIENT SURVEYS:  Modified Oswestry 26/50   SCREENING FOR RED  FLAGS: Bowel or bladder incontinence: No Spinal tumors: No Cauda equina syndrome: No Compression fracture: No Abdominal aneurysm: No  COGNITION: Overall cognitive status: Within functional limits for tasks assessed     SENSATION: Light touch intact B LE's today  MUSCLE LENGTH: Hamstrings: Right wfl deg; Left wfl deg Olivia Shah test: Right wfl deg; Left wfl deg  POSTURE:  narrowed stance B, trendelenburg lurch B, with increased sway with gait, increased lumbar lordosis  PALPATION: Non tender with palpation lumbar paraspinals and B post hip musculature  LUMBAR ROM:   AROM eval  Flexion 80%  Extension 50%  Right lateral flexion 50  Left lateral flexion 50  Right rotation   Left rotation    (Blank rows = not tested)  LOWER EXTREMITY ROM:   all LE AAROM WNL B LOWER EXTREMITY MMT:    MMT Right eval Left eval  Hip flexion 3 3-  Hip extension 3+ 3+  Hip abduction 3 3-  Hip adduction    Hip internal rotation    Hip external rotation    Knee flexion    Knee extension 4 4  Ankle dorsiflexion    Ankle plantarflexion 4- 4-  Ankle inversion    Ankle eversion     (Blank rows = not tested)  LUMBAR SPECIAL TESTS:  Straight leg raise test: Negative and FABER test: Negative  FUNCTIONAL TESTS  30 seconds chair stand test 8 reps Timed up and go (TUG): 20.15 2 minute walk test: 90', 1 min 10 sec, stopped due to B thigh pain  GAIT: Distance walked: 61' Assistive device utilized: Single point cane Level of assistance: Modified independence Comments: fatigued quickly, increased trunk sway and decreased hip control with fatigue  TODAY'S TREATMENT:  DATE:  11/24/23 Nustep L 5 8 min Walking 2 min w/o AD PRE 5  Pain RT LE 7/10- wanted to stop at 90 sec. Pain is what stops me Trunk flex and Ext 20 x black tband STS with wt ball chest press 10x Leg ext  10# 2x10 HS curls 25# 2x12 Standing rows and ext green band 2x10 4" box taps by stairs  Step ups 4" by stairs     11/21/23 Walking 2 mins w/o AD  Leg ext 5# 2x10 HS curls 25# 2x10 NuStep L5x41mins Calf raises 2x10 Calf stretch 30s  Standing rows and ext green band 2x10 4" box taps by stairs  Step ups 4" by stairs   11/14/23 NuStep L5 x19mins  Fitter pushes 1 black and 1 blue band 2x10 each side  Side steps along mat table 3x down and back Walking laps 2 laps with SPC  STS 2x8   11/10/23 Amb 3 laps no AD about 350 feet 2 min 15 sec. 3 rd lap very SOB and pain in HS. 1 LOB as fatigued did not clear foot Seated rest then 2 laps 225 feet 1 min 30 sec no LOB Green tband seated trunk flex and ext 20 x Green tband trunk rotation 15 x each 6 inch step ups with BIL UE ( some pull) 10 x each STS with wt ball chest press 10 x NuStep L5x67mins 30# resisted gait 5x fwd, 3 x each side   11/07/23 NuStep L5x22mins Side steps over bands on floor then forward steps trying to focus on big steps  Leg ext 5# 2x12  HS curls 20# 2x12  STS 2x10   Step ups 4" by stairs x10 each side    11/03/23 Nustep L 5 7 min Alt step tap 20 x 6 inch 2 x with 3 # ankle wts 3# HHA marching fwd and backwards 20 feet 2 x each, then side stepping 3# ankle wts HHA alt LE SL hip flex,ext and abd 20 x Black tband trunk flex and ext 20 x each HS curls 25lb 2x10 Leg Ext 5lb 2x10 Worked on amb level surface without AD working on cadeance  and balance with cuing to pick feet up, trendelenburg walking   10/31/23 NuStep L5x65mins Calf raises 2x10 Toe raises 2x10 Calf stretch on slant board  Leg ext 5# 2x10 Green band HS curls 2x10 Walking 2 big laps no breaks  Pball stretches forwards x10  10/27/23 NuStep L5x72mins  5# hip flexion with SPC Seated tapping to the side onto 6" step w/5# 2x10 Supine bridges x10 LTR x10 Supine LE stretching  Feet on pball rotations and knees to chest  STS w/chest press yellow  ball 2x8   10/24/23 NuStep L5 x62mins  S2S w/ chest press yellow ball 2x10 Alt 4in box taps, then 6in x 10 each  Alt 6in box taps from airex 2x5 Sides steps over objects  Calf raises 2x10  Shoulder Ext 5lb 2x10 Seated rows blue 2x15 Seated hip abduction bloe 2x15  10/17/23 STS x10, then with chest press 3# x10  Side steps over bands on floor  4 way step overs NuStep L5x58mins  Box taps 6" Calf raises 2x10  Standing march 5# w/SPC  10/13/23 Recheck goals after one month of not being here  -STS -BERG -TUG  NuStep L5x5mins  HS curls 25lb 2x10 Leg Ext 5lb 2x10 Walking laps without AD 2 big laps    09/12/23 CHECKED Goals  Updated POC Berg STS TUG walk test  and oswestry  08/22/23 NuStep L5 x65mins  HS curls 25lb 2x10 Leg Ext 5lb 2x10 Alt 6in box taps form airex w/ SPC 2x10 S2S LE on airex 2x10 Standing on airex row and ext green band 2x10 Side steps over obstacles CGA  08/18/23 NuStep L5 x53mins  Box taps 6"  Calf raises 2x10 Toe raises 2x10 STS holding yellow ball 2x10 LAQ 5# 2x10  HS curls green 25# 2x10 On airex marching using SPC- minA  Standing row and ext green band 2x10  08/15/23 NuStep L5x21mins  Resisted gait 20# 4 way x4 CGA  Alt 4 & 6in box taps x10 each STS on airex 2x10  Side step over WaTE 2x5 each HS curls 25# 2x10 Leg ext 10# 2x10  08/04/23 NuStep L5x19mins  5# seated hip flexion, lateral tap on 4" step  5# standing marches w/SPC  Leg ext 10# 2x10 HS curls 25# 2x10 STS on airex 2x10   07/25/23 NuStep L5 x58mins Resisted gait 20# 4 way x4 CGA  Side steps over obstacles CGA Modified sit ups holding yellow ball 2x10  STS holding yellow ball 2x10 Calf raises 2x12 Toe raises 2x12   07/21/23 Assessed and doc goals for renewal Nustep L 5 20# resisted gait 5 x fwd,5 x backward, 3 x each way BERG 39/56   Standing HHA 5# ankle wts stepping over foam roll 2 sets 5 mod A struggled with LLE. Then lateral step 2 set 5 min  A Standing HHA 5# ankle wts    07/11/23 NuStep L5 x25mins  Walking on beam Side steps on airex standing by steps  Step ups 6" with hand rail  STS on airex with red ball chest press 2x10 Calf raises 2x10  Calf stretch 15s x2 Toe raises 2x10 Walking without AD 2 big laps       Education details: POC, goals Person educated: Patient Education method: Explanation, Demonstration, Tactile cues, Verbal cues, and Handouts Education comprehension: verbalized understanding, returned demonstration, verbal cues required, and tactile cues required  HOME EXERCISE PROGRAM: Access Code: [email or web address] URL: [email or web address] Date: 05/16/2023 Prepared by: Amy Speaks  Exercises - Supine Lower Trunk Rotation  - 1 x daily - 3 x weekly - 3 sets - 10 reps - Hooklying Single Knee to Chest  - 1 x daily - 3 x weekly - 3 sets - 10 reps - Supine Bridge  - 1 x daily - 3 x weekly - 3 sets - 10 reps  ASSESSMENT:  CLINICAL IMPRESSION:  pt arrived stating some better with steroids but feels its just temporary. Pt states PT is helping strength but not pain,states func and gait are limited by pain- leg pain starts in about 2 min with gait and limiting goal progress. Pt sees MD 3/27 after 3/13 MRI- will hold until after theses appts and bring back in on 3/31 and reassess after further testing done.   OBJECTIVE IMPAIRMENTS:   ACTIVITY LIMITATIONS: carrying, lifting, bending, standing, squatting, stairs, transfers, bathing, and locomotion level  PARTICIPATION LIMITATIONS: cleaning and laundry  PERSONAL FACTORS: Age, Behavior pattern, Fitness, Past/current experiences, Time since onset of injury/illness/exacerbation, Transportation, and 3+ comorbidities: h/o brain tumor, visual loss , DM  are also affecting patient's functional outcome.   REHAB POTENTIAL: Good  CLINICAL DECISION MAKING: Evolving/moderate complexity  EVALUATION COMPLEXITY: Moderate   GOALS: Goals reviewed with patient?  Yes  SHORT TERM GOALS: Target date: 2 weeks   I HEP Baseline:initiated at eval Goal status: 06/16/23 MET   LONG TERM GOALS: Target  date: 12/01/23  30 sec sit to stand 11 Baseline: 8 Goal status: 06/23/23  progressing 10 x from mat without UE 07/07/23 10 x mat without UE progressing 07/21/23 10x ,11 x in 33.4 sec 09/12/23 ongoing 8 reps in 30 seconds  10/13/23-- 11 reps MET  2.  TUG improve to 14 or less Baseline: 20.15 Goal status:  06/23/23 10.33 sec MET no AD  3.  2 min walk test, 250' or greater Baseline: walked 90 ' in one min and had to stop limited by pain Goal status: 06/23/23 progressing walked 200 feet in 90 sec with SPC- SOB then walked without AD 100 feet 43 sec then stopped d/t weakness and increased lateral sway  10/10 /24 amb 200 feet without AD slight limp on RT 1 min 28 sec progressing 07/21/23 2 big laps in gym 200 feet no AD 1 min 30 sec- decreased control with rt step,lands hard on RT esp as she fatigues. SPC 1 min 10 sec with smoother stride and better control of RT LE.  Met 09/12/23 247ft w/ SPC 2.5 laps around the gym  4.  Modified oswestry 36/50 Baseline: 26/50 Goal status: Ongoing 25/50 09/12/23, 29/50 ongoing 10/13/23  11/10/23 progresing  11/24/23 progressing  5. Patient will improve BERG score to 45/56 or better   Baseline: 39/56  Goal status: 44/56 09/12/23 Progressing   10/13/23- MET 46/56  6. Patient will demonstrate improved gait and be able to walk 165ft with decreased limp and no cane  Baseline: "I want to walk straighter but I feel like I lean forward because of pain"  Goal status: progressing 10/17/23    progressing 11/10/23 and 11/24/23   PLAN:  PT FREQUENCY: 2x/week  PT DURATION: 8 weeks  PLANNED INTERVENTIONS: Therapeutic exercises, Therapeutic activity, Neuromuscular re-education, Balance training, Gait training, Patient/Family education, Self Care, and Joint mobilization.  PLAN FOR NEXT SESSION: functional strengthening, LE balance and pain  management  12/22/23  Patient called and has elected to be discharged, she reports that she will have a few more MD appointments and then will see about a need to return to PT  [facility], PTA,  11/24/2023, 4:59 PM [facility] at [facility] [address] [facility]. [place], [place], [address] Phone: [phone]   Fax:  [phone] [facility] at [facility] [address] [place], [place], [address] Phone: [phone]   Fax:  [phone]  Patient Details  Name: Olivia Shah MRN: [id number] Date of Birth: 1956/07/06 Referring Provider:  Donalee Citrin, MD  Encounter Date: 11/24/2023

## 2023-12-26 ENCOUNTER — Ambulatory Visit: Payer: Medicare Other | Admitting: Physical Therapy

## 2023-12-29 ENCOUNTER — Ambulatory Visit: Payer: Medicare Other | Admitting: Physical Therapy

## 2024-01-02 ENCOUNTER — Ambulatory Visit: Payer: Medicare Other

## 2024-01-05 ENCOUNTER — Ambulatory Visit: Payer: Medicare Other | Admitting: Physical Therapy

## 2024-01-09 ENCOUNTER — Ambulatory Visit: Payer: Medicare Other

## 2024-01-12 ENCOUNTER — Ambulatory Visit: Payer: Medicare Other | Admitting: Physical Therapy

## 2024-01-17 ENCOUNTER — Encounter: Payer: Self-pay | Admitting: Neurology

## 2024-01-17 ENCOUNTER — Ambulatory Visit (INDEPENDENT_AMBULATORY_CARE_PROVIDER_SITE_OTHER): Payer: Medicare Other | Admitting: Neurology

## 2024-01-17 VITALS — BP 174/102 | HR 62 | Ht 62.0 in | Wt 212.0 lb

## 2024-01-17 DIAGNOSIS — Z9889 Other specified postprocedural states: Secondary | ICD-10-CM

## 2024-01-17 DIAGNOSIS — Z86018 Personal history of other benign neoplasm: Secondary | ICD-10-CM

## 2024-01-17 DIAGNOSIS — G40909 Epilepsy, unspecified, not intractable, without status epilepticus: Secondary | ICD-10-CM | POA: Diagnosis not present

## 2024-01-17 MED ORDER — LACOSAMIDE 50 MG PO TABS: 50.0000 mg | ORAL_TABLET | Freq: Two times a day (BID) | ORAL | 0 refills | Status: AC

## 2024-01-17 NOTE — Progress Notes (Signed)
 [facility]  PATIENT: Olivia Shah DOB: [birth year]  REFERRING CLINICIAN: Merl Star, MD HISTORY FROM: Patient, sister REASON FOR VISIT: New onset seizure s/p meningioma resection    HISTORICAL  CHIEF COMPLAINT:  Chief Complaint  Patient presents with   Follow-up    Rm12, alone, HY:QMVH hospital visit since 2022, doing well regarding sz,  Status post resection of meningioma: pt stated that she has been doing well and the most recent hospital visit was due to fall no sz occurred (falls screening completed)    INTERVAL HISTORY 01/17/2024:  Patient presents today for follow-up, last visit was a year ago, since then she has been doing well, denies any seizure or seizure like activity.  She is compliant with her Vimpat  100 mg twice daily.  No side effect from the medication.  Her main complaint is a back pain related to her cervical stenosis.  Denies any recent falls.   INTERVAL HISTORY 01/17/2023:  Patient presents today for follow-up, last visit was a year ago.  Since then she has been doing well, denies any seizure or seizure-like activity.  She is still on the Vimpat  200 mg twice daily but reports somnolence in the evening.  She does not have any somnolence after taking her morning dose.  Overall she is doing well, continue to work, again no seizure no other events.  INTERVAL HISTORY 01/14/2022 Patient presents today for follow up. She is accompanied by her sister. At last visit, plan was to continue with Keppra  and Vimpat , she could not tolerate Keppra  and was experiencing hallucinations. Therefore Keppra  discontinued and and she continued on Vimpat  200 mg BID. Since discontinuing the Keppra , no additional hallucinations, doing well on Vimpat , no additional seizures, no other side effects.   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  She underwent a total gross resection on August 22 and postop course complicated by focal status epilepticus requiring multiple anti seizure medications.  She was finally extubated, her ASM regimen was simplified to Keppra  1000 mg twice daily and lacosamide  200 mg twice daily.  She did have a follow-up MRI which showed incidental right hemispheric strokes and also in the right caudate.  She did have a long rehab course.  She was finally discharged home October 12.  She was not noted to have any seizures while in rehab.  Per family she continued to have cognitive issues but has improved.  She continues with physical therapy and Occupational Therapy.  She also have improvement in her swallowing status. In terms of seizures she denies any past medical history of seizures, denies any family history of seizures, denies any seizure risk factor, seizure started status post meningioma resection.  She still need assistance from transferring from sitting to standing and from laying down to sitting up.  She also needs assistance with using the bathroom.  Handedness: Left handed   Seizure Type: right focal seizure  Current frequency: None since being in rehab   Any injuries from seizures: None   Seizure risk factors: Meningioma resection   Previous ASMs: Levetiracetam , Lacosamide , Phenytoin , Phenobarb, Propofol    Currenty ASMs: Lacosamide    ASMs side effects: Hallucination on Levetiracetam    Brain Images: Skull based meningioma (04/21/2021)   Previous EEGs: Status epilepticus  (05/21/2021)  OTHER MEDICAL CONDITIONS: Diabetes mellitus type 2, hypertension, congenital right eye blindness.   REVIEW OF SYSTEMS: Full 14 system review of systems performed and negative with exception of: As noted in the HPI  ALLERGIES: Allergies  Allergen Reactions   Atorvastatin      Other Reaction(s): muscle  pain/itching/runny nose   Cashew Nut (Anacardium Occidentale) Skin Test Hives    Hives to multiple nuts   Levetiracetam      Other Reaction(s): Hallucination   Lisinopril Cough   Shellfish Allergy Hives    HOME MEDICATIONS: Outpatient Medications Prior to Visit  Medication Sig Dispense Refill   acetaminophen  (TYLENOL ) 500 MG tablet Take 500 mg by mouth every 6 (six) hours as needed.     Ascorbic Acid  (VITAMIN C) 1000 MG tablet Take 1,000 mg by mouth daily.     aspirin  EC 81 MG tablet Take 81 mg by mouth daily.     cholecalciferol (VITAMIN D3) 25 MCG (1000 UNIT) tablet Take 1,000 Units by mouth daily.     dorzolamide -timolol  (COSOPT ) 22.3-6.8 MG/ML ophthalmic solution Place 1 drop into the left eye 2 (two) times daily.     famotidine  (PEPCID ) 20 MG tablet Take 1 tablet (20 mg total) by mouth 2 (two) times daily. 60 tablet 0   hydrochlorothiazide  (HYDRODIURIL ) 12.5 MG tablet Take 1 tablet (12.5 mg total) by mouth daily. 30 tablet 0   latanoprost  (XALATAN ) 0.005 % ophthalmic solution Place 1 drop into the left eye at bedtime.     losartan  (COZAAR ) 50 MG tablet Take 50 mg by mouth daily.     metFORMIN  (GLUCOPHAGE ) 500 MG tablet Take 2 tablets (1,000 mg total) by mouth daily with breakfast AND 1 tablet (500 mg total) daily with supper. 90 tablet 0   metoprolol  tartrate (LOPRESSOR ) 100 MG tablet Take 2 tablets (200 mg total) by mouth at bedtime. 60 tablet 0   potassium chloride  (KLOR-CON ) 10 MEQ tablet Take 2 tablets (20 mEq total) by mouth in the morning and at supper. 120 tablet 0   vitamin E  180 MG (400 UNITS) capsule Take 1 capsule (400 Units total) by mouth daily. 30 capsule 0   Lacosamide  100 MG TABS Take 1 tablet by mouth twice daily 180 tablet 3   No facility-administered medications prior to visit.    PAST MEDICAL HISTORY: Past Medical History:  Diagnosis Date   Arthritis    Diabetes mellitus without complication (HCC)    Hypertension     PAST SURGICAL HISTORY: Past Surgical  History:  Procedure Laterality Date   APPLICATION OF CRANIAL NAVIGATION N/A 05/18/2021   Procedure: APPLICATION OF CRANIAL NAVIGATION;  Surgeon: Cannon Champion, MD;  Location: [facility] OR;  Service: Neurosurgery;  Laterality: N/A;   BREAST BIOPSY Right    CHOLECYSTECTOMY     CRANIOTOMY Right 05/18/2021   Procedure: Right craniotomy for tumor resection;  Surgeon: Cannon Champion, MD;  Location: [facility] OR;  Service: Neurosurgery;  Laterality: Right;   EYE SURGERY     as a child   HERNIA REPAIR      FAMILY HISTORY: Family History  Problem Relation Age of Onset   Breast cancer Neg Hx     SOCIAL HISTORY: Social History   Socioeconomic History   Marital status: Single    Spouse name: Not on file   Number of children: Not on file   Years of education: Not on file   Highest education level: Not on file  Occupational History   Not on file  Tobacco Use  Smoking status: Never   Smokeless tobacco: Never  Substance and Sexual Activity   Alcohol use: Not on file   Drug use: Not Currently   Sexual activity: Not Currently  Other Topics Concern   Not on file  Social History Narrative   Not on file   Social Drivers of Health   Financial Resource Strain: Not on file  Food Insecurity: Not on file  Transportation Needs: Not on file  Physical Activity: Not on file  Stress: Not on file  Social Connections: Unknown (02/08/2022)   Received from [facility], [facility]   Social Network    Social Network: Not on file  Intimate Partner Violence: Unknown (01/01/2022)   Received from [facility], [facility]   HITS    Physically Hurt: Not on file    Insult or Talk Down To: Not on file    Threaten Physical Harm: Not on file    Scream or Curse: Not on file     PHYSICAL EXAM GENERAL EXAM/CONSTITUTIONAL: Vitals:  Vitals:   01/17/24 1029 01/17/24 1033  BP: (!) 172/98 (!) 174/102  Pulse:  62  Weight:  212 lb (96.2 kg)  Height:  5\' 2"  (1.575 m)    Body mass index is 38.78  kg/m. Wt Readings from Last 3 Encounters:  01/17/24 212 lb (96.2 kg)  01/17/23 199 lb (90.3 kg)  01/14/22 175 lb (79.4 kg)   Patient is in no distress; well developed, nourished and groomed; neck is supple  MUSCULOSKELETAL: Gait, strength, tone, movements noted in Neurologic exam below  NEUROLOGIC: MENTAL STATUS:      No data to display         awake, alert, oriented to person, place   language fluent, normal comprehension and attention  CRANIAL NERVE:   2nd, 3rd, 4th, 6th - left pupil is reactive to light, she has loss of peripheral vision with the left eye and no vision perception with the right.    MOTOR:  At least antigravity.    GAIT/STATION:  Walks with a walker     DIAGNOSTIC DATA (LABS, IMAGING, TESTING) - I reviewed patient records, labs, notes, testing and imaging myself where available.  Lab Results  Component Value Date   WBC 4.5 07/06/2021   HGB 12.1 07/06/2021   HCT 36.7 07/06/2021   MCV 90.0 07/06/2021   PLT 320 07/06/2021      Component Value Date/Time   NA 139 07/06/2021 0646   K 4.3 07/06/2021 0646   CL 110 07/06/2021 0646   CO2 21 (L) 07/06/2021 0646   GLUCOSE 107 (H) 07/06/2021 0646   BUN 9 07/06/2021 0646   CREATININE 0.88 07/06/2021 0646   CALCIUM  9.1 07/06/2021 0646   PROT 5.8 (L) 06/17/2021 0504   ALBUMIN 2.7 (L) 06/17/2021 0504   AST 15 06/17/2021 0504   ALT 36 06/17/2021 0504   ALKPHOS 94 06/17/2021 0504   BILITOT 0.3 06/17/2021 0504   GFRNONAA >60 07/06/2021 0646   Lab Results  Component Value Date   CHOL 201 (H) 07/16/2021   HDL 59 07/16/2021   LDLCALC 124 (H) 07/16/2021   TRIG 101 07/16/2021   Lab Results  Component Value Date   HGBA1C 6.6 (H) 06/05/2021   No results found for: "VITAMINB12" No results found for: "TSH"  MRI 04/22/2021 3.2 x 2.6 x 2.4 cm dural-based mass compatible with meningioma growing along the planum sphenoidale, tuberculum sellae and extending to the paraclinoid regions bilaterally.  Involvement of regional structures, as detailed (including,  but not limited to, significant mass effect upon the optic apparatus). The mass has not significantly changed in size as compared to the prior outside MRI of 03/18/2021. Mild chronic small vessel ischemic changes within the cerebral white matter and pons. Mild generalized parenchymal atrophy.   MRI 05/19/2021 1. Interval right pterional craniotomy and resection of an anterior skull base meningioma. No significant residual masslike enhancement. This study will serve as a new baseline future follow-up imaging. 2. Acute infarcts in the right anterior temporal lobe, inferior frontal lobe, lentiform nucleus and posterior limb of the right internal capsule with mild associated edema. 3. Moderate right greater than left antidependent pneumocephalus (up to 1.8 cm thick) and small volume right-sided extra-axial hemorrhage and/or gelfoam subjacent to the craniotomy (up to 0.9 cm thick) with resulting 3 mm of leftward midline shift and effacement of the right lateral ventricle.   MRI 06/23/2021 1. No new infarct or other acute intracranial abnormality. 2. Evolving subacute infarcts and postoperative changes.   EEG  This study was initially suggestive of cortical dysfunction in right frontotemporal region likely secondary to underlying structural abnormality.  At around 1443 on 05/20/2021, patient started having seizures during which EKG was noted to have left facial twitching and no clinical signs were noted.  Seizures arising from left frontotemporal region, average duration about 2 to 3 minutes.  Patient had around 9 seizures between 40 43-16 49 on 05/20/2021.  Seizure medications were adjusted after which seizures improved.  However seizures recorded on 2332 on 05/20/2021 and patient had average 12 seizures per hour, lasting only 1 minute.  This EEG pattern is consistent with focal status epilepticus.  There is also evidence of moderate diffuse  encephalopathy, nonspecific etiology but likely due to seizures.   ASSESSMENT AND PLAN  68 y.o. year old female  with past medical history of diabetes mellitus type 2, hypertension, congenital blindness of the right eye, no previous history of seizures who was found incidentally to have a skull base meningioma, postresection course complicated by status epilepticus requiring up to 4 antiseizure medications.  Her antiseizure medication has been simplified while in rehab to Keppra  1000 mg twice daily, lacosamide  200 mg twice daily to currently Lacosamide  100 mg BID only.  Denies any seizure or seizure-like activity for the past year.  Will further decrease her lacosamide  to 50 mg twice daily for 3 months and patient can stop the medication.  Advised her to contact me if she does have a seizure or seizure-like activity, at that time we will likely restart lacosamide  at 100 mg twice daily.  Continue follow-up PCP and return as needed.   1. Seizure disorder (HCC)   2. Status post resection of meningioma     Patient Instructions  Decrease lacosamide  to 50 mg twice daily for 3 months then stop the medication Continue your other medications Continue to follow with PCP Call us  if you do have another seizure, at that time we will likely restart lacosamide  at 100 mg twice daily Return as needed.   Per [place] statutes, patients with seizures are not allowed to drive until they have been seizure-free for six months.  Other recommendations include using caution when using heavy equipment or power tools. Avoid working on ladders or at heights. Take showers instead of baths.  Do not swim alone.  Ensure the water temperature is not too high on the home water heater. Do not go swimming alone. Do not lock yourself in a room alone (i.e. bathroom). When  caring for infants or small children, sit down when holding, feeding, or changing them to minimize risk of injury to the child in the event you have a  seizure. Maintain good sleep hygiene. Avoid alcohol.  Also recommend adequate sleep, hydration, good diet and minimize stress.   During the Seizure  - First, ensure adequate ventilation and place patients on the floor on their left side  Loosen clothing around the neck and ensure the airway is patent. If the patient is clenching the teeth, do not force the mouth open with any object as this can cause severe damage - Remove all items from the surrounding that can be hazardous. The patient may be oblivious to what's happening and may not even know what he or she is doing. If the patient is confused and wandering, either gently guide him/her away and block access to outside areas - Reassure the individual and be comforting - Call 911. In most cases, the seizure ends before EMS arrives. However, there are cases when seizures may last over 3 to 5 minutes. Or the individual may have developed breathing difficulties or severe injuries. If a pregnant patient or a person with diabetes develops a seizure, it is prudent to call an ambulance. - Finally, if the patient does not regain full consciousness, then call EMS. Most patients will remain confused for about 45 to 90 minutes after a seizure, so you must use judgment in calling for help. - Avoid restraints but make sure the patient is in a bed with padded side rails - Place the individual in a lateral position with the neck slightly flexed; this will help the saliva drain from the mouth and prevent the tongue from falling backward - Remove all nearby furniture and other hazards from the area - Provide verbal assurance as the individual is regaining consciousness - Provide the patient with privacy if possible - Call for help and start treatment as ordered by the caregiver   After the Seizure (Postictal Stage)  After a seizure, most patients experience confusion, fatigue, muscle pain and/or a headache. Thus, one should permit the individual to sleep. For  the next few days, reassurance is essential. Being calm and helping reorient the person is also of importance.  Most seizures are painless and end spontaneously. Seizures are not harmful to others but can lead to complications such as stress on the lungs, brain and the heart. Individuals with prior lung problems may develop labored breathing and respiratory distress.     No orders of the defined types were placed in this encounter.    Meds ordered this encounter  Medications   lacosamide  (VIMPAT ) 50 MG TABS tablet    Sig: Take 1 tablet (50 mg total) by mouth 2 (two) times daily.    Dispense:  180 tablet    Refill:  0     Return if symptoms worsen or fail to improve.    Cassandra Cleveland, MD 01/17/2024, 1:19 PM  [facility] [address], [address] [place], [place] [address] [phone]

## 2024-01-17 NOTE — Patient Instructions (Signed)
 Decrease lacosamide  to 50 mg twice daily for 3 months then stop the medication Continue your other medications Continue to follow with PCP Call us  if you do have another seizure, at that time we will likely restart lacosamide  at 100 mg twice daily Return as needed.

## 2024-02-01 ENCOUNTER — Encounter: Payer: Self-pay | Admitting: Podiatrist

## 2024-02-01 ENCOUNTER — Ambulatory Visit (INDEPENDENT_AMBULATORY_CARE_PROVIDER_SITE_OTHER): Admitting: Podiatrist

## 2024-02-01 DIAGNOSIS — M79675 Pain in left toe(s): Secondary | ICD-10-CM

## 2024-02-01 DIAGNOSIS — B351 Tinea unguium: Secondary | ICD-10-CM | POA: Diagnosis not present

## 2024-02-01 DIAGNOSIS — M79674 Pain in right toe(s): Secondary | ICD-10-CM | POA: Diagnosis not present

## 2024-02-01 NOTE — Progress Notes (Signed)
   Chief Complaint  Patient presents with   [facility]    DFC, thick fungal nails. NIDDM A1C 6.9.    SUBJECTIVE Patient with a history of diabetes mellitus presents to office today complaining of elongated, thickened nails that cause pain while ambulating in shoes.  Patient is unable to trim their own nails. Patient is here for further evaluation and treatment.  Past Medical History:  Diagnosis Date   Arthritis    Diabetes mellitus without complication (HCC)    Hypertension     Allergies  Allergen Reactions   Atorvastatin      Other Reaction(s): muscle pain/itching/runny nose   Cashew Nut (Anacardium Occidentale) Skin Test Hives    Hives to multiple nuts   Levetiracetam      Other Reaction(s): Hallucination   Lisinopril Cough   Shellfish Allergy Hives     OBJECTIVE General Patient is awake, alert, and oriented x 3 and in no acute distress. Derm Skin is dry and supple bilateral. Negative open lesions or macerations. Remaining integument unremarkable. Nails are tender, long, thickened and dystrophic with subungual debris, consistent with onychomycosis, 1-5 bilateral. No signs of infection noted. Vasc  DP and PT pedal pulses palpable bilaterally. Temperature gradient within normal limits.  Neuro Epicritic and protective threshold sensation diminished bilaterally.  Musculoskeletal Exam No symptomatic pedal deformities noted bilateral. Muscular strength within normal limits.  ASSESSMENT 1. Diabetes Mellitus w/ peripheral neuropathy 2.  Pain due to onychomycosis of toenails bilateral  PLAN OF CARE 1. Patient evaluated today.  2. Instructed to maintain good pedal hygiene and foot care. Stressed importance of controlling blood sugar.  3. Mechanical debridement of nails 1-5 bilaterally performed using a nail nipper. Filed with dremel without incident.  4. Return to clinic in 3 mos.

## 2024-02-13 ENCOUNTER — Ambulatory Visit

## 2024-02-24 NOTE — Therapy (Incomplete)
 OUTPATIENT PHYSICAL THERAPY THORACOLUMBAR EVALUATION   Patient Name: Olivia Shah MRN: [id number] DOB:Mar 14, 1956, 68 y.o., female Today's Date: 02/24/2024  END OF SESSION:   Past Medical History:  Diagnosis Date   Arthritis    Diabetes mellitus without complication (HCC)    Hypertension    Past Surgical History:  Procedure Laterality Date   APPLICATION OF CRANIAL NAVIGATION N/A 05/18/2021   Procedure: APPLICATION OF CRANIAL NAVIGATION;  Surgeon: Cannon Champion, MD;  Location: [facility] OR;  Service: Neurosurgery;  Laterality: N/A;   BREAST BIOPSY Right    CHOLECYSTECTOMY     CRANIOTOMY Right 05/18/2021   Procedure: Right craniotomy for tumor resection;  Surgeon: Cannon Champion, MD;  Location: [facility] OR;  Service: Neurosurgery;  Laterality: Right;   EYE SURGERY     as a child   HERNIA REPAIR     Patient Active Problem List   Diagnosis Date Noted   Abnormal findings on diagnostic imaging of other specified body structures 01/14/2022   Chronic pain 01/14/2022   Colon cancer screening 01/14/2022   Dysphagia 01/14/2022   Family history of malignant neoplasm of digestive organs 01/14/2022   Hyperglycemia due to type 2 diabetes mellitus (HCC) 01/14/2022   Hypertension 01/14/2022   Meralgia paresthetica 01/14/2022   Mixed hyperlipidemia 01/14/2022   Morbid obesity (HCC) 01/14/2022   Overweight 01/14/2022   Pure hypercholesterolemia 01/14/2022   Spinal cord disease (HCC) 01/14/2022   Chronic cough 12/01/2021   Diabetes (HCC) 07/08/2021   Glaucoma 07/08/2021   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   Visual field defect of left eye 07/08/2021   Debility 06/16/2021   Hypokalemia    Seizures (HCC)    Benign essential HTN    Controlled type 2 diabetes mellitus with hyperglycemia, without long-term current use of insulin  (HCC)    Stridor 06/05/2021   Status post tracheostomy (HCC)    Acute respiratory failure with hypoxia (HCC)    Status epilepticus (HCC)    Brain tumor (HCC)  05/18/2021    PCP: Merl Star  REFERRING PROVIDER: Gearl Keens  REFERRING DIAG:  [phone] (ICD-10-CM) - Spinal stenosis, lumbar region with neurogenic claudication    Rationale for Evaluation and Treatment: Rehabilitation  THERAPY DIAG:  No diagnosis found.  ONSET DATE: ***  SUBJECTIVE:                                                                                                                                                                                           SUBJECTIVE STATEMENT: ***  PERTINENT HISTORY:  HTN, HLD, Obesity, A1C controlled  Had a fall on 01/13/24 at a restaurant- hit her head and fell  on R knee. Small eyebrow and lip laceration    PAIN:  Are you having pain? {OPRCPAIN:27236}  PRECAUTIONS: {Therapy precautions:24002}  RED FLAGS: {PT Red Flags:29287}   WEIGHT BEARING RESTRICTIONS: {Yes ***/No:24003}  FALLS:  Has patient fallen in last 6 months? {fallsyesno:27318}  LIVING ENVIRONMENT: Lives with: {OPRC lives with:25569::"lives with their family"} Lives in: {Lives in:25570} Stairs: {opstairs:27293} Has following equipment at home: {Assistive devices:23999}  OCCUPATION: ***  PLOF: {PLOF:24004}  PATIENT GOALS: ***  NEXT MD VISIT: ***  OBJECTIVE:  Note: Objective measures were completed at Evaluation unless otherwise noted.  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 with stenosis at L3-4  Transitional anatomy with rudimentary disc at L5-S1  PATIENT SURVEYS:  {rehab surveys:24030}  COGNITION: Overall cognitive status: {cognition:24006}     SENSATION: {sensation:27233}  MUSCLE LENGTH: Hamstrings: Right *** deg; Left *** deg Andy Bannister test: Right *** deg; Left *** deg  POSTURE: {posture:25561}  PALPATION: ***  LUMBAR ROM:   AROM eval  Flexion   Extension   Right lateral flexion   Left lateral flexion   Right rotation   Left rotation    (Blank rows = not  tested)  LOWER EXTREMITY ROM:     {AROM/PROM:27142}  Right eval Left eval  Hip flexion    Hip extension    Hip abduction    Hip adduction    Hip internal rotation    Hip external rotation    Knee flexion    Knee extension    Ankle dorsiflexion    Ankle plantarflexion    Ankle inversion    Ankle eversion     (Blank rows = not tested)  LOWER EXTREMITY MMT:    MMT Right eval Left eval  Hip flexion    Hip extension    Hip abduction    Hip adduction    Hip internal rotation    Hip external rotation    Knee flexion    Knee extension    Ankle dorsiflexion    Ankle plantarflexion    Ankle inversion    Ankle eversion     (Blank rows = not tested)  LUMBAR SPECIAL TESTS:  {lumbar special test:25242}  FUNCTIONAL TESTS:  {Functional tests:24029}  GAIT: Distance walked: *** Assistive device utilized: {Assistive devices:23999} Level of assistance: {Levels of assistance:24026} Comments: ***  TREATMENT DATE: ***                                                                                                                                 PATIENT EDUCATION:  Education details: *** Person educated: {Person educated:25204} Education method: {Education Method:25205} Education comprehension: {Education Comprehension:25206}  HOME EXERCISE PROGRAM: ***  ASSESSMENT:  CLINICAL IMPRESSION: Patient is a *** y.o. *** who was seen today for physical therapy evaluation and treatment for ***.   OBJECTIVE IMPAIRMENTS: {opptimpairments:25111}.   ACTIVITY LIMITATIONS: {activitylimitations:27494}  PARTICIPATION LIMITATIONS: {participationrestrictions:25113}  PERSONAL FACTORS: {Personal factors:25162} are also  affecting patient's functional outcome.   REHAB POTENTIAL: {rehabpotential:25112}  CLINICAL DECISION MAKING: {clinical decision making:25114}  EVALUATION COMPLEXITY: {Evaluation complexity:25115}   GOALS: Goals reviewed with patient? {yes/no:20286}  SHORT TERM  GOALS: Target date: ***  *** Baseline: Goal status: INITIAL  2.  *** Baseline:  Goal status: INITIAL  3.  *** Baseline:  Goal status: INITIAL  4.  *** Baseline:  Goal status: INITIAL  5.  *** Baseline:  Goal status: INITIAL  6.  *** Baseline:  Goal status: INITIAL  LONG TERM GOALS: Target date: ***  *** Baseline:  Goal status: INITIAL  2.  *** Baseline:  Goal status: INITIAL  3.  *** Baseline:  Goal status: INITIAL  4.  *** Baseline:  Goal status: INITIAL  5.  *** Baseline:  Goal status: INITIAL  6.  *** Baseline:  Goal status: INITIAL  PLAN:  PT FREQUENCY: {rehab frequency:25116}  PT DURATION: {rehab duration:25117}  PLANNED INTERVENTIONS: {rehab planned interventions:25118::"97110-Therapeutic exercises","97530- Therapeutic [phone]- Neuromuscular re-education","97535- Self IONG","29528- Manual therapy"}.  PLAN FOR NEXT SESSION: ***   Donavon Fudge, PT 02/24/2024, 8:29 AM

## 2024-02-27 ENCOUNTER — Ambulatory Visit

## 2024-03-01 ENCOUNTER — Ambulatory Visit: Admitting: Physical Therapy

## 2024-03-08 ENCOUNTER — Encounter (INDEPENDENT_AMBULATORY_CARE_PROVIDER_SITE_OTHER): Payer: Self-pay

## 2024-03-12 ENCOUNTER — Ambulatory Visit

## 2024-03-23 ENCOUNTER — Encounter (INDEPENDENT_AMBULATORY_CARE_PROVIDER_SITE_OTHER): Payer: Self-pay

## 2024-04-02 ENCOUNTER — Ambulatory Visit

## 2024-05-02 ENCOUNTER — Ambulatory Visit: Admitting: Podiatry

## 2024-05-07 ENCOUNTER — Other Ambulatory Visit: Payer: Self-pay | Admitting: Internal Medicine

## 2024-05-07 DIAGNOSIS — Z1231 Encounter for screening mammogram for malignant neoplasm of breast: Secondary | ICD-10-CM

## 2024-05-09 ENCOUNTER — Other Ambulatory Visit (HOSPITAL_COMMUNITY): Payer: Self-pay

## 2024-06-01 NOTE — Therapy (Signed)
 OUTPATIENT PHYSICAL THERAPY THORACOLUMBAR EVALUATION   Patient Name: Olivia Shah MRN: [id number] DOB:[birth year], 68 y.o., female Today's Date: 06/04/2024  END OF SESSION:  PT End of Session - 06/04/24 1450     Visit Number 1    Date for PT Re-Evaluation 08/27/24    Authorization Type Medicare    PT Start Time 1450    PT Stop Time 1530    PT Time Calculation (min) 40 min          Past Medical History:  Diagnosis Date   Arthritis    Diabetes mellitus without complication (HCC)    Hypertension    Past Surgical History:  Procedure Laterality Date   APPLICATION OF CRANIAL NAVIGATION N/A 05/18/2021   Procedure: APPLICATION OF CRANIAL NAVIGATION;  Surgeon: Cheryle Debby LABOR, MD;  Location: [facility] OR;  Service: Neurosurgery;  Laterality: N/A;   BREAST BIOPSY Right    CHOLECYSTECTOMY     CRANIOTOMY Right 05/18/2021   Procedure: Right craniotomy for tumor resection;  Surgeon: Cheryle Debby LABOR, MD;  Location: [facility] OR;  Service: Neurosurgery;  Laterality: Right;   EYE SURGERY     as a child   HERNIA REPAIR     Patient Active Problem List   Diagnosis Date Noted   Abnormal findings on diagnostic imaging of other specified body structures 01/14/2022   Chronic pain 01/14/2022   Colon cancer screening 01/14/2022   Dysphagia 01/14/2022   Family history of malignant neoplasm of digestive organs 01/14/2022   Hyperglycemia due to type 2 diabetes mellitus (HCC) 01/14/2022   Hypertension 01/14/2022   Meralgia paresthetica 01/14/2022   Mixed hyperlipidemia 01/14/2022   Morbid obesity (HCC) 01/14/2022   Overweight 01/14/2022   Pure hypercholesterolemia 01/14/2022   Spinal cord disease (HCC) 01/14/2022   Chronic cough 12/01/2021   Diabetes (HCC) 07/08/2021   Glaucoma 07/08/2021   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   Visual field defect of left eye 07/08/2021   Debility 06/16/2021   Hypokalemia    Seizures (HCC)    Benign essential HTN    Controlled type 2 diabetes mellitus with  hyperglycemia, without long-term current use of insulin  (HCC)    Stridor 06/05/2021   Status post tracheostomy (HCC)    Acute respiratory failure with hypoxia (HCC)    Status epilepticus (HCC)    Brain tumor (HCC) 05/18/2021    PCP: Tanda Bame  REFERRING PROVIDER: Arley Helling  REFERRING DIAG: spinal stenosis, lumbar region with neurogenic claudication   Rationale for Evaluation and Treatment: Rehabilitation  THERAPY DIAG:  Other lack of coordination  Muscle weakness (generalized)  Difficulty in walking, not elsewhere classified  Other low back pain  Chronic bilateral low back pain with bilateral sciatica  Unsteadiness on feet  ONSET DATE: chronic  SUBJECTIVE:  SUBJECTIVE STATEMENT: I got a shot August 18th and it lasted about a week but I still have pain in the legs. I am using the rollator outside the house and inside the work building. But inside the house using the cane and try to walk on her own. The rollator supports my back pretty good.   PERTINENT HISTORY:  See above  PAIN:  Are you having pain? Yes: NPRS scale: 5/10 Pain location: in my legs  Pain description: achy, dull, nagging  Aggravating factors: cold weather, standing and walking long periods  Relieving factors: Tylenol   PRECAUTIONS: Fall  RED FLAGS: None   WEIGHT BEARING RESTRICTIONS: No  FALLS:  Has patient fallen in last 6 months? 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  DIAGNOSTIC FINDINGS:  N/A  COGNITION: Overall cognitive status: Within functional limits for tasks assessed     SENSATION: WFL  POSTURE: rounded shoulders, forward head, and flexed trunk    LUMBAR ROM: *all the pain is in her legs*  AROM eval  Flexion Can touch floor but pain in legs  Extension 25% with pain in leg  Right lateral flexion WFL  Left lateral flexion WFL  Right rotation 50% with pain  Left rotation 50% with pain   (Blank rows = not tested)  LOWER EXTREMITY ROM:  WFL   LOWER EXTREMITY MMT:  4+/5 BLE   FUNCTIONAL TESTS:  5 times sit to stand: 23s Timed up and go (TUG): 24s  GAIT: Distance walked: in clinic distances Assistive device utilized: Environmental consultant - 4 wheeled Level of assistance: Complete Independence Comments: able to walk without AD but unsteady, antalgic, and slowed cadence and speed  TREATMENT DATE: 06/04/24- EVAL                                                                                                                                 PATIENT EDUCATION:  Education details: POC Person educated: Patient Education method: Explanation Education comprehension: verbalized understanding  HOME EXERCISE PROGRAM: TBD  ASSESSMENT:  CLINICAL IMPRESSION: Patient is a 68 y.o. female who was seen today for physical therapy evaluation and treatment for lumbar spondylosis and stenosis. She also has recurrence of bilateral leg pain mostly L3 distribution. She is ambulating with a rollator when outside her house and using her cane or no AD inside. Patient was able to complete TUG without an AD device at eval. She will benefit from PT to address her leg pain and increase her functional independence to be able to get around with more  ease.   OBJECTIVE IMPAIRMENTS: Abnormal gait, decreased activity tolerance, decreased balance, difficulty walking, improper body  mechanics, postural dysfunction, and pain.   ACTIVITY LIMITATIONS: carrying, lifting, bending, squatting, stairs, and locomotion level  PARTICIPATION LIMITATIONS: cleaning, laundry, shopping, community activity, occupation, and yard work  PERSONAL FACTORS: Age, Fitness, Past/current experiences, Time since onset of injury/illness/exacerbation, and Transportation are also affecting patient's functional outcome.   REHAB POTENTIAL: Good  CLINICAL DECISION MAKING: Stable/uncomplicated  EVALUATION COMPLEXITY: Low   GOALS: Goals reviewed with patient? Yes  SHORT TERM GOALS: Target date: 07/16/24  Patient will be independent with initial HEP. Baseline:  Goal status: INITIAL  2.  Patient will demonstrate decreased fall risk by scoring <15 sec on TUG. Baseline: 24s Goal status: INITIAL   LONG TERM GOALS: Target date: 08/27/24  Patient will be independent with advanced/ongoing HEP to improve outcomes and carryover.  Baseline:  Goal status: INITIAL  2.  Patient will be able to ambulate 500' with LRAD with good safety to access community.  Baseline: using rollator  Goal status: INITIAL  3.  Patient will reports 50% improvement in leg pain Baseline:  Goal status: INITIAL   4.  Patient will demonstrate improved functional LE strength as demonstrated by 5xSTS <15s. Baseline: 23s Goal status: INITIAL  5.  Patient will complete 2 min walk test without assistive device in <1 min 30s Baseline:  Goal status: INITIAL   PLAN:  PT FREQUENCY: 2x/week  PT DURATION: 12 weeks  PLANNED INTERVENTIONS: 97110-Therapeutic exercises, 97530- Therapeutic activity, 97112- Neuromuscular re-education, 97535- Self Care, 02859- Manual therapy, [id number]- Gait training, [phone]- Electrical stimulation (manual), [phone] (1-2 muscles), 20561 (3+ muscles)- Dry Needling, Patient/Family education, Balance training, Stair training, Taping, Joint mobilization, Spinal mobilization, Cryotherapy, and Moist  heat.  PLAN FOR NEXT SESSION: update HEP, functional tasks   [place], PT 06/04/2024, 3:17 PM

## 2024-06-04 ENCOUNTER — Ambulatory Visit
Admission: RE | Admit: 2024-06-04 | Discharge: 2024-06-04 | Disposition: A | Discharge status: Home / Self Care | Source: Ambulatory Visit | Attending: Internal Medicine | Admitting: Internal Medicine

## 2024-06-04 ENCOUNTER — Ambulatory Visit: Attending: Neurosurgery

## 2024-06-04 DIAGNOSIS — M5441 Lumbago with sciatica, right side: Secondary | ICD-10-CM | POA: Diagnosis present

## 2024-06-04 DIAGNOSIS — M5442 Lumbago with sciatica, left side: Secondary | ICD-10-CM | POA: Insufficient documentation

## 2024-06-04 DIAGNOSIS — M5459 Other low back pain: Secondary | ICD-10-CM | POA: Insufficient documentation

## 2024-06-04 DIAGNOSIS — R2681 Unsteadiness on feet: Secondary | ICD-10-CM | POA: Diagnosis present

## 2024-06-04 DIAGNOSIS — G8929 Other chronic pain: Secondary | ICD-10-CM | POA: Insufficient documentation

## 2024-06-04 DIAGNOSIS — R252 Cramp and spasm: Secondary | ICD-10-CM | POA: Insufficient documentation

## 2024-06-04 DIAGNOSIS — Z1231 Encounter for screening mammogram for malignant neoplasm of breast: Secondary | ICD-10-CM

## 2024-06-04 DIAGNOSIS — R278 Other lack of coordination: Secondary | ICD-10-CM | POA: Diagnosis present

## 2024-06-04 DIAGNOSIS — R262 Difficulty in walking, not elsewhere classified: Secondary | ICD-10-CM | POA: Diagnosis present

## 2024-06-04 DIAGNOSIS — M6281 Muscle weakness (generalized): Secondary | ICD-10-CM | POA: Diagnosis present

## 2024-06-25 ENCOUNTER — Encounter: Payer: Self-pay | Admitting: Physical Therapy

## 2024-06-25 ENCOUNTER — Ambulatory Visit: Admitting: Physical Therapy

## 2024-06-25 DIAGNOSIS — M6281 Muscle weakness (generalized): Secondary | ICD-10-CM

## 2024-06-25 DIAGNOSIS — R262 Difficulty in walking, not elsewhere classified: Secondary | ICD-10-CM

## 2024-06-25 DIAGNOSIS — R278 Other lack of coordination: Secondary | ICD-10-CM | POA: Diagnosis not present

## 2024-06-25 DIAGNOSIS — R252 Cramp and spasm: Secondary | ICD-10-CM

## 2024-06-25 DIAGNOSIS — M5459 Other low back pain: Secondary | ICD-10-CM

## 2024-06-25 DIAGNOSIS — R2681 Unsteadiness on feet: Secondary | ICD-10-CM

## 2024-06-25 NOTE — Therapy (Signed)
 OUTPATIENT PHYSICAL THERAPY THORACOLUMBAR EVALUATION   Patient Name: Olivia Shah MRN: [id number] DOB:1956-06-29, 68 y.o., female Today's Date: 06/25/2024  END OF SESSION:  PT End of Session - 06/25/24 1056     Visit Number 2    Date for Recertification  08/27/24    PT Start Time 1100    PT Stop Time 1145    PT Time Calculation (min) 45 min    Activity Tolerance Patient tolerated treatment well    Behavior During Therapy [facility] for tasks assessed/performed          Past Medical History:  Diagnosis Date   Arthritis    Diabetes mellitus without complication (HCC)    Hypertension    Past Surgical History:  Procedure Laterality Date   APPLICATION OF CRANIAL NAVIGATION N/A 05/18/2021   Procedure: APPLICATION OF CRANIAL NAVIGATION;  Surgeon: Cheryle Debby LABOR, MD;  Location: [facility] OR;  Service: Neurosurgery;  Laterality: N/A;   BREAST BIOPSY Right    CHOLECYSTECTOMY     CRANIOTOMY Right 05/18/2021   Procedure: Right craniotomy for tumor resection;  Surgeon: Cheryle Debby LABOR, MD;  Location: [facility] OR;  Service: Neurosurgery;  Laterality: Right;   EYE SURGERY     as a child   HERNIA REPAIR     Patient Active Problem List   Diagnosis Date Noted   Abnormal findings on diagnostic imaging of other specified body structures 01/14/2022   Chronic pain 01/14/2022   Colon cancer screening 01/14/2022   Dysphagia 01/14/2022   Family history of malignant neoplasm of digestive organs 01/14/2022   Hyperglycemia due to type 2 diabetes mellitus (HCC) 01/14/2022   Hypertension 01/14/2022   Meralgia paresthetica 01/14/2022   Mixed hyperlipidemia 01/14/2022   Morbid obesity (HCC) 01/14/2022   Overweight 01/14/2022   Pure hypercholesterolemia 01/14/2022   Spinal cord disease (HCC) 01/14/2022   Chronic cough 12/01/2021   Diabetes (HCC) 07/08/2021   Glaucoma 07/08/2021   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   Visual field defect of left eye 07/08/2021   Debility 06/16/2021   Hypokalemia     Seizures (HCC)    Benign essential HTN    Controlled type 2 diabetes mellitus with hyperglycemia, without long-term current use of insulin  (HCC)    Stridor 06/05/2021   Status post tracheostomy (HCC)    Acute respiratory failure with hypoxia (HCC)    Status epilepticus (HCC)    Brain tumor (HCC) 05/18/2021    PCP: Tanda Bame  REFERRING PROVIDER: Arley Helling  REFERRING DIAG: spinal stenosis, lumbar region with neurogenic claudication   Rationale for Evaluation and Treatment: Rehabilitation  THERAPY DIAG:  Difficulty in walking, not elsewhere classified  Muscle weakness (generalized)  Unsteadiness on feet  Cramp and spasm  Other low back pain  ONSET DATE: chronic  SUBJECTIVE:  SUBJECTIVE STATEMENT:  Will be receiving another injection in November. Pain in the R leg.    I got a shot August 18th and it lasted about a week but I still have pain in the legs. I am using the rollator outside the house and inside the work building. But inside the house using the cane and try to walk on her own. The rollator supports my back pretty good.   PERTINENT HISTORY:  See above  PAIN:  Are you having pain? Yes: NPRS scale: 3/10 Pain location: in my legs  Pain description: achy, dull, nagging  Aggravating factors: cold weather, standing and walking long periods  Relieving factors: Tylenol   PRECAUTIONS: Fall  RED FLAGS: None   WEIGHT BEARING RESTRICTIONS: No  FALLS:  Has patient fallen in last 6 months? 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  DIAGNOSTIC FINDINGS:  N/A  COGNITION: Overall cognitive status: Within functional limits for tasks assessed     SENSATION: WFL  POSTURE: rounded shoulders, forward head, and flexed trunk    LUMBAR ROM: *all the pain is in her legs*  AROM eval  Flexion Can touch floor but pain in legs  Extension 25% with pain in leg  Right lateral flexion WFL  Left lateral flexion WFL  Right rotation 50% with pain  Left rotation 50% with pain   (Blank rows = not tested)  LOWER EXTREMITY ROM:  WFL   LOWER EXTREMITY MMT:  4+/5 BLE   FUNCTIONAL TESTS:  5 times sit to stand: 23s Timed up and go (TUG): 24s  GAIT: Distance walked: in clinic distances Assistive device utilized: Environmental consultant - 4 wheeled Level of assistance: Complete Independence Comments: able to walk without AD but unsteady, antalgic, and slowed cadence and speed  TREATMENT DATE:  06/25/28 NuStep L 5 x 6 min Shoulder Ext red 2x10 Rows red 2x10 Seated HS curls green 2x10 each LAQ 2lb 2x10  Seated  march 2lb 2x10  06/04/24- EVAL                                                                                                                                 PATIENT EDUCATION:  Education details: POC Person educated: Patient Education method: Explanation Education comprehension: verbalized understanding  HOME EXERCISE PROGRAM: Access Code: [email or web address] URL: [email or web address] Date: 06/25/2024 Prepared by: Tanda Sorrow  Exercises - Supine Bridge  - 1 x daily - 7 x weekly - 3 sets - 10 reps - Standing Shoulder Extension with Resistance  - 1 x daily - 7  x weekly - 3 sets - 10 reps - Standing Row with Resistance  - 1 x daily - 7 x weekly - 3 sets - 10 reps  ASSESSMENT:  CLINICAL IMPRESSION: Patient is a 68 y.o. female who  was seen today for physical therapy treatment for lumbar spondylosis and stenosis. She also has recurrence of bilateral leg pain mostly L3 distribution. She is ambulating with a rollator when outside her house and using her cane or no AD inside. Session consisted of an introduction to postural and LE strengthening. Cues for posture and core engagement needed with rows and extensions. No reported of increase pain during session.  Core weakness with seated march. She will benefit from PT to address her leg pain and increase her functional independence to be able to get around with more ease.   OBJECTIVE IMPAIRMENTS: Abnormal gait, decreased activity tolerance, decreased balance, difficulty walking, improper body mechanics, postural dysfunction, and pain.   ACTIVITY LIMITATIONS: carrying, lifting, bending, squatting, stairs, and locomotion level  PARTICIPATION LIMITATIONS: cleaning, laundry, shopping, community activity, occupation, and yard work  PERSONAL FACTORS: Age, Fitness, Past/current experiences, Time since onset of injury/illness/exacerbation, and Transportation are also affecting patient's functional outcome.   REHAB POTENTIAL: Good  CLINICAL DECISION MAKING: Stable/uncomplicated  EVALUATION COMPLEXITY: Low   GOALS: Goals reviewed with patient? Yes  SHORT TERM GOALS: Target date: 07/16/24  Patient will be independent with initial HEP. Baseline:  Goal status: INITIAL  2.  Patient will demonstrate decreased fall risk by scoring <15 sec on TUG. Baseline: 24s Goal status: INITIAL   LONG TERM GOALS: Target date: 08/27/24  Patient will be independent with advanced/ongoing HEP to improve outcomes and carryover.  Baseline:  Goal status: INITIAL  2.  Patient will be able to ambulate 500' with LRAD with good safety to access community.  Baseline: using rollator  Goal status: INITIAL  3.  Patient will reports 50% improvement in leg pain Baseline:  Goal status: INITIAL   4.   Patient will demonstrate improved functional LE strength as demonstrated by 5xSTS <15s. Baseline: 23s Goal status: INITIAL  5.  Patient will complete 2 min walk test without assistive device in <1 min 30s Baseline:  Goal status: INITIAL   PLAN:  PT FREQUENCY: 2x/week  PT DURATION: 12 weeks  PLANNED INTERVENTIONS: 97110-Therapeutic exercises, 97530- Therapeutic activity, 97112- Neuromuscular re-education, 97535- Self Care, 02859- Manual therapy, [id number]- Gait training, [phone]- Electrical stimulation (manual), [phone] (1-2 muscles), 20561 (3+ muscles)- Dry Needling, Patient/Family education, Balance training, Stair training, Taping, Joint mobilization, Spinal mobilization, Cryotherapy, and Moist heat.  PLAN FOR NEXT SESSION: update HEP, functional tasks   Tanda KANDICE Sorrow, PTA 06/25/2024, 10:57 AM

## 2024-06-28 ENCOUNTER — Ambulatory Visit: Attending: Neurosurgery

## 2024-06-28 DIAGNOSIS — M6281 Muscle weakness (generalized): Secondary | ICD-10-CM | POA: Insufficient documentation

## 2024-06-28 DIAGNOSIS — R2681 Unsteadiness on feet: Secondary | ICD-10-CM | POA: Insufficient documentation

## 2024-06-28 DIAGNOSIS — M5441 Lumbago with sciatica, right side: Secondary | ICD-10-CM | POA: Insufficient documentation

## 2024-06-28 DIAGNOSIS — R278 Other lack of coordination: Secondary | ICD-10-CM | POA: Insufficient documentation

## 2024-06-28 DIAGNOSIS — M5442 Lumbago with sciatica, left side: Secondary | ICD-10-CM | POA: Insufficient documentation

## 2024-06-28 DIAGNOSIS — R262 Difficulty in walking, not elsewhere classified: Secondary | ICD-10-CM | POA: Insufficient documentation

## 2024-06-28 DIAGNOSIS — R252 Cramp and spasm: Secondary | ICD-10-CM | POA: Diagnosis present

## 2024-06-28 DIAGNOSIS — G8929 Other chronic pain: Secondary | ICD-10-CM | POA: Insufficient documentation

## 2024-06-28 DIAGNOSIS — M5459 Other low back pain: Secondary | ICD-10-CM | POA: Insufficient documentation

## 2024-06-28 NOTE — Therapy (Signed)
 OUTPATIENT PHYSICAL THERAPY THORACOLUMBAR TREATMENT   Patient Name: Olivia Shah MRN: [id number] DOB:Aug 28, 1956, 68 y.o., female Today's Date: 06/28/2024  END OF SESSION:  PT End of Session - 06/28/24 1710     Visit Number 3    Date for Recertification  08/27/24    PT Start Time 1710    PT Stop Time 1755    PT Time Calculation (min) 45 min    Activity Tolerance Patient tolerated treatment well    Behavior During Therapy [facility] for tasks assessed/performed           Past Medical History:  Diagnosis Date   Arthritis    Diabetes mellitus without complication (HCC)    Hypertension    Past Surgical History:  Procedure Laterality Date   APPLICATION OF CRANIAL NAVIGATION N/A 05/18/2021   Procedure: APPLICATION OF CRANIAL NAVIGATION;  Surgeon: Cheryle Debby LABOR, MD;  Location: [facility] OR;  Service: Neurosurgery;  Laterality: N/A;   BREAST BIOPSY Right    CHOLECYSTECTOMY     CRANIOTOMY Right 05/18/2021   Procedure: Right craniotomy for tumor resection;  Surgeon: Cheryle Debby LABOR, MD;  Location: [facility] OR;  Service: Neurosurgery;  Laterality: Right;   EYE SURGERY     as a child   HERNIA REPAIR     Patient Active Problem List   Diagnosis Date Noted   Abnormal findings on diagnostic imaging of other specified body structures 01/14/2022   Chronic pain 01/14/2022   Colon cancer screening 01/14/2022   Dysphagia 01/14/2022   Family history of malignant neoplasm of digestive organs 01/14/2022   Hyperglycemia due to type 2 diabetes mellitus (HCC) 01/14/2022   Hypertension 01/14/2022   Meralgia paresthetica 01/14/2022   Mixed hyperlipidemia 01/14/2022   Morbid obesity (HCC) 01/14/2022   Overweight 01/14/2022   Pure hypercholesterolemia 01/14/2022   Spinal cord disease (HCC) 01/14/2022   Chronic cough 12/01/2021   Diabetes (HCC) 07/08/2021   Glaucoma 07/08/2021   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   Visual field defect of left eye 07/08/2021   Debility 06/16/2021   Hypokalemia     Seizures (HCC)    Benign essential HTN    Controlled type 2 diabetes mellitus with hyperglycemia, without long-term current use of insulin  (HCC)    Stridor 06/05/2021   Status post tracheostomy (HCC)    Acute respiratory failure with hypoxia (HCC)    Status epilepticus (HCC)    Brain tumor (HCC) 05/18/2021    PCP: Tanda Bame  REFERRING PROVIDER: Arley Helling  REFERRING DIAG: spinal stenosis, lumbar region with neurogenic claudication   Rationale for Evaluation and Treatment: Rehabilitation  THERAPY DIAG:  Difficulty in walking, not elsewhere classified  Muscle weakness (generalized)  Unsteadiness on feet  Chronic bilateral low back pain with bilateral sciatica  ONSET DATE: chronic  SUBJECTIVE:  SUBJECTIVE STATEMENT: The legs are still hurting, mostly the R but sometimes it is the L. The front of the R leg hurts all the time. Earlier today I felt like a cramp coming on in the back of my L leg.   I got a shot August 18th and it lasted about a week but I still have pain in the legs. I am using the rollator outside the house and inside the work building. But inside the house using the cane and try to walk on her own. The rollator supports my back pretty good.   PERTINENT HISTORY:  See above  PAIN:  Are you having pain? Yes: NPRS scale: 3/10 Pain location: in my legs  Pain description: achy, dull, nagging  Aggravating factors: cold weather, standing and walking long periods  Relieving factors: Tylenol   PRECAUTIONS: Fall  RED FLAGS: None   WEIGHT BEARING RESTRICTIONS: No  FALLS:  Has patient fallen in last 6 months? 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  DIAGNOSTIC FINDINGS:  N/A  COGNITION: Overall cognitive status: Within functional limits for tasks assessed     SENSATION: WFL  POSTURE: rounded shoulders, forward head, and flexed trunk    LUMBAR ROM: *all the pain is in her legs*  AROM eval  Flexion Can touch floor but pain in legs  Extension 25% with pain in leg  Right lateral flexion WFL  Left lateral flexion WFL  Right rotation 50% with pain  Left rotation 50% with pain   (Blank rows = not tested)  LOWER EXTREMITY ROM:  WFL   LOWER EXTREMITY MMT:  4+/5 BLE   FUNCTIONAL TESTS:  5 times sit to stand: 23s Timed up and go (TUG): 24s  GAIT: Distance walked: in clinic distances Assistive device utilized: Environmental consultant - 4 wheeled Level of assistance: Complete Independence Comments: able to walk without AD but unsteady, antalgic, and slowed cadence and speed  TREATMENT DATE:  06/28/24 NuStep L5x20mins  Rotations with green band x10 STS 2x8 Standing march Supine feet on pball rotations and knees to chest LTR with band x10 Ball squeeze 2x10 Shoulder ext red 2x12   06/25/28 NuStep L 5 x 6 min Shoulder Ext red 2x10 Rows red 2x10 Seated HS curls green 2x10 each LAQ 2lb 2x10  Seated  march 2lb 2x10  06/04/24- EVAL                                                                                                                                 PATIENT EDUCATION:  Education details: POC Person educated: Patient Education method: Explanation Education comprehension: verbalized understanding  HOME EXERCISE PROGRAM: Access Code: [email or web address] URL: [email or web address] Date: 06/25/2024 Prepared by: Tanda Sorrow  Exercises - Supine Bridge  - 1 x daily - 7 x weekly - 3 sets - 10 reps - Standing Shoulder Extension with Resistance  - 1 x daily - 7 x weekly - 3 sets - 10 reps - Standing Row with Resistance  - 1 x daily - 7 x weekly - 3 sets - 10 reps  ASSESSMENT:  CLINICAL IMPRESSION: Patient is a 68 y.o. female who was seen today for physical therapy treatment for lumbar spondylosis and stenosis. She also c/o ongoing pain in her legs. Session consisted of some postural and LE strengthening. Cues for posture and core engagement needed for interventions. She is flexed over and states this is due to the pain in her back and legs, and she is having to rely more on the walker. Needs cues to try to get up taller and push hips forward with STS. Was able to get up more upright with shoulder extensions. She has increased pain in the R leg so had more difficulty with standing exercises today. She will benefit from PT to address her leg pain and increase her functional independence to be able to get around with more ease.   OBJECTIVE IMPAIRMENTS: Abnormal gait, decreased activity tolerance, decreased balance, difficulty walking, improper body mechanics, postural dysfunction, and pain.   ACTIVITY LIMITATIONS: carrying, lifting, bending, squatting, stairs, and locomotion level  PARTICIPATION LIMITATIONS: cleaning, laundry, shopping, community activity, occupation, and yard work  PERSONAL FACTORS: Age, Fitness, Past/current experiences, Time since onset of injury/illness/exacerbation, and Transportation are also affecting patient's functional outcome.   REHAB POTENTIAL: Good  CLINICAL DECISION MAKING: Stable/uncomplicated  EVALUATION COMPLEXITY: Low   GOALS: Goals reviewed with patient? Yes  SHORT TERM GOALS: Target date: 07/16/24  Patient will be independent with initial HEP. Baseline:  Goal status: INITIAL  2.  Patient will demonstrate decreased fall risk by scoring <15 sec on TUG. Baseline:  24s Goal status: INITIAL   LONG TERM GOALS: Target date: 08/27/24  Patient will be independent with advanced/ongoing HEP to improve outcomes and carryover.  Baseline:  Goal status: INITIAL  2.  Patient will be able to ambulate 500' with LRAD with good safety to access community.  Baseline: using rollator  Goal status: INITIAL  3.  Patient will reports 50% improvement in leg pain Baseline:  Goal status: INITIAL   4.  Patient will demonstrate improved functional LE strength as demonstrated by 5xSTS <15s. Baseline: 23s Goal status: INITIAL  5.  Patient will complete 2 min walk test without assistive device in <1 min 30s Baseline:  Goal status: INITIAL   PLAN:  PT FREQUENCY: 2x/week  PT DURATION: 12 weeks  PLANNED INTERVENTIONS: 97110-Therapeutic exercises, 97530- Therapeutic activity, 97112- Neuromuscular re-education, 97535- Self Care, [id number]- Manual therapy, [id number]- Gait training, [phone]- Electrical stimulation (manual), [phone] (1-2 muscles), [id number] (3+ muscles)- Dry Needling, Patient/Family education, Balance training, Stair training, Taping, Joint mobilization, Spinal mobilization, Cryotherapy, and Moist heat.  PLAN FOR NEXT SESSION: update HEP, functional tasks   [place], PT 06/28/2024, 5:51 PM

## 2024-06-29 NOTE — Therapy (Signed)
 OUTPATIENT PHYSICAL THERAPY THORACOLUMBAR TREATMENT   Patient Name: Olivia Shah MRN: [id number] DOB:05/29/1956, 68 y.o., female Today's Date: 07/02/2024  END OF SESSION:  PT End of Session - 07/02/24 1103     Visit Number 4    Date for Recertification  08/27/24    PT Start Time 1103    PT Stop Time 1145    PT Time Calculation (min) 42 min    Activity Tolerance Patient tolerated treatment well    Behavior During Therapy [facility] for tasks assessed/performed            Past Medical History:  Diagnosis Date   Arthritis    Diabetes mellitus without complication (HCC)    Hypertension    Past Surgical History:  Procedure Laterality Date   APPLICATION OF CRANIAL NAVIGATION N/A 05/18/2021   Procedure: APPLICATION OF CRANIAL NAVIGATION;  Surgeon: Cheryle Debby LABOR, MD;  Location: [facility] OR;  Service: Neurosurgery;  Laterality: N/A;   BREAST BIOPSY Right    CHOLECYSTECTOMY     CRANIOTOMY Right 05/18/2021   Procedure: Right craniotomy for tumor resection;  Surgeon: Cheryle Debby LABOR, MD;  Location: [facility] OR;  Service: Neurosurgery;  Laterality: Right;   EYE SURGERY     as a child   HERNIA REPAIR     Patient Active Problem List   Diagnosis Date Noted   Abnormal findings on diagnostic imaging of other specified body structures 01/14/2022   Chronic pain 01/14/2022   Colon cancer screening 01/14/2022   Dysphagia 01/14/2022   Family history of malignant neoplasm of digestive organs 01/14/2022   Hyperglycemia due to type 2 diabetes mellitus (HCC) 01/14/2022   Hypertension 01/14/2022   Meralgia paresthetica 01/14/2022   Mixed hyperlipidemia 01/14/2022   Morbid obesity (HCC) 01/14/2022   Overweight 01/14/2022   Pure hypercholesterolemia 01/14/2022   Spinal cord disease (HCC) 01/14/2022   Chronic cough 12/01/2021   Diabetes (HCC) 07/08/2021   Glaucoma 07/08/2021   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   Visual field defect of left eye 07/08/2021   Debility 06/16/2021   Hypokalemia     Seizures (HCC)    Benign essential HTN    Controlled type 2 diabetes mellitus with hyperglycemia, without long-term current use of insulin  (HCC)    Stridor 06/05/2021   Status post tracheostomy (HCC)    Acute respiratory failure with hypoxia (HCC)    Status epilepticus (HCC)    Brain tumor (HCC) 05/18/2021    PCP: Tanda Bame  REFERRING PROVIDER: Arley Helling  REFERRING DIAG: spinal stenosis, lumbar region with neurogenic claudication   Rationale for Evaluation and Treatment: Rehabilitation  THERAPY DIAG:  Difficulty in walking, not elsewhere classified  Muscle weakness (generalized)  Other lack of coordination  Unsteadiness on feet  Chronic bilateral low back pain with bilateral sciatica  Cramp and spasm  ONSET DATE: chronic  SUBJECTIVE:  SUBJECTIVE STATEMENT: The legs are still hurting, mostly the R. Not as bad as it was last time.   I got a shot August 18th and it lasted about a week but I still have pain in the legs. I am using the rollator outside the house and inside the work building. But inside the house using the cane and try to walk on her own. The rollator supports my back pretty good.   PERTINENT HISTORY:  See above  PAIN:  Are you having pain? Yes: NPRS scale: 3/10 Pain location: in my legs  Pain description: achy, dull, nagging  Aggravating factors: cold weather, standing and walking long periods  Relieving factors: Tylenol   PRECAUTIONS: Fall  RED FLAGS: None   WEIGHT BEARING RESTRICTIONS: No  FALLS:  Has patient fallen in last 6 months? 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  DIAGNOSTIC FINDINGS:  N/A  COGNITION: Overall cognitive status: Within functional limits for tasks assessed     SENSATION: WFL  POSTURE: rounded shoulders, forward head, and flexed trunk    LUMBAR ROM: *all the pain is in her legs*  AROM eval  Flexion Can touch floor but pain in legs  Extension 25% with pain in leg  Right lateral flexion WFL  Left lateral flexion WFL  Right rotation 50% with pain  Left rotation 50% with pain   (Blank rows = not tested)  LOWER EXTREMITY ROM:  WFL   LOWER EXTREMITY MMT:  4+/5 BLE   FUNCTIONAL TESTS:  5 times sit to stand: 23s Timed up and go (TUG): 24s  GAIT: Distance walked: in clinic distances Assistive device utilized: Environmental consultant - 4 wheeled Level of assistance: Complete Independence Comments: able to walk without AD but unsteady, antalgic, and slowed cadence and speed  TREATMENT DATE:  07/02/24 LAQ 3# 2x12 Standing march and hip abd 3# 2x10 HS curls green 2x10 Shoulder ext red 2x10 Standing rows red 2x10 NuStep L5x8mins    06/28/24 NuStep L5x5mins  Rotations with green band x10 STS 2x8 Standing march Supine feet on pball rotations and knees to chest LTR with band x10 Ball squeeze 2x10 Shoulder ext red 2x12   06/25/28 NuStep L 5 x 6 min Shoulder Ext red 2x10 Rows red 2x10 Seated HS curls green 2x10 each LAQ 2lb 2x10  Seated  march 2lb 2x10  06/04/24- EVAL  PATIENT EDUCATION:  Education details: POC Person educated: Patient Education method: Explanation Education comprehension: verbalized understanding  HOME EXERCISE PROGRAM: Access Code:  [email or web address] URL: [email or web address] Date: 06/25/2024 Prepared by: Tanda Sorrow  Exercises - Supine Bridge  - 1 x daily - 7 x weekly - 3 sets - 10 reps - Standing Shoulder Extension with Resistance  - 1 x daily - 7 x weekly - 3 sets - 10 reps - Standing Row with Resistance  - 1 x daily - 7 x weekly - 3 sets - 10 reps  ASSESSMENT:  CLINICAL IMPRESSION: Patient is a 68 y.o. female who was seen today for physical therapy treatment for lumbar spondylosis and stenosis. She also c/o ongoing pain in her legs, however it is better than what it was last time. Session consisted of LE and postural strengthening. Cues needed with standing banded exercise to stay upright. Needed breaks with standing exercises due to the pain in her R leg. She will benefit from PT to address her leg pain and increase her functional independence to be able to get around with more ease.   OBJECTIVE IMPAIRMENTS: Abnormal gait, decreased activity tolerance, decreased balance, difficulty walking, improper body mechanics, postural dysfunction, and pain.   ACTIVITY LIMITATIONS: carrying, lifting, bending, squatting, stairs, and locomotion level  PARTICIPATION LIMITATIONS: cleaning, laundry, shopping, community activity, occupation, and yard work  PERSONAL FACTORS: Age, Fitness, Past/current experiences, Time since onset of injury/illness/exacerbation, and Transportation are also affecting patient's functional outcome.   REHAB POTENTIAL: Good  CLINICAL DECISION MAKING: Stable/uncomplicated  EVALUATION COMPLEXITY: Low   GOALS: Goals reviewed with patient? Yes  SHORT TERM GOALS: Target date: 07/16/24  Patient will be independent with initial HEP. Baseline:  Goal status: INITIAL  2.  Patient will demonstrate decreased fall risk by scoring <15 sec on TUG. Baseline: 24s Goal status: INITIAL   LONG TERM GOALS: Target date: 08/27/24  Patient will be independent with advanced/ongoing HEP to improve  outcomes and carryover.  Baseline:  Goal status: INITIAL  2.  Patient will be able to ambulate 500' with LRAD with good safety to access community.  Baseline: using rollator  Goal status: INITIAL  3.  Patient will reports 50% improvement in leg pain Baseline:  Goal status: INITIAL   4.  Patient will demonstrate improved functional LE strength as demonstrated by 5xSTS <15s. Baseline: 23s Goal status: INITIAL  5.  Patient will complete 2 min walk test without assistive device in <1 min 30s Baseline:  Goal status: INITIAL   PLAN:  PT FREQUENCY: 2x/week  PT DURATION: 12 weeks  PLANNED INTERVENTIONS: 97110-Therapeutic exercises, 97530- Therapeutic activity, 97112- Neuromuscular re-education, 97535- Self Care, 02859- Manual therapy, [id number]- Gait training, [phone]- Electrical stimulation (manual), [phone] (1-2 muscles), 20561 (3+ muscles)- Dry Needling, Patient/Family education, Balance training, Stair training, Taping, Joint mobilization, Spinal mobilization, Cryotherapy, and Moist heat.  PLAN FOR NEXT SESSION: update HEP, functional tasks   [place], PT 07/02/2024, 11:39 AM

## 2024-07-02 ENCOUNTER — Ambulatory Visit

## 2024-07-02 DIAGNOSIS — R262 Difficulty in walking, not elsewhere classified: Secondary | ICD-10-CM

## 2024-07-02 DIAGNOSIS — R2681 Unsteadiness on feet: Secondary | ICD-10-CM

## 2024-07-02 DIAGNOSIS — M6281 Muscle weakness (generalized): Secondary | ICD-10-CM

## 2024-07-02 DIAGNOSIS — R252 Cramp and spasm: Secondary | ICD-10-CM

## 2024-07-02 DIAGNOSIS — R278 Other lack of coordination: Secondary | ICD-10-CM

## 2024-07-02 DIAGNOSIS — G8929 Other chronic pain: Secondary | ICD-10-CM

## 2024-07-05 ENCOUNTER — Ambulatory Visit

## 2024-07-05 DIAGNOSIS — G8929 Other chronic pain: Secondary | ICD-10-CM

## 2024-07-05 DIAGNOSIS — R252 Cramp and spasm: Secondary | ICD-10-CM

## 2024-07-05 DIAGNOSIS — R262 Difficulty in walking, not elsewhere classified: Secondary | ICD-10-CM

## 2024-07-05 DIAGNOSIS — M5459 Other low back pain: Secondary | ICD-10-CM

## 2024-07-05 DIAGNOSIS — R2681 Unsteadiness on feet: Secondary | ICD-10-CM

## 2024-07-05 DIAGNOSIS — M6281 Muscle weakness (generalized): Secondary | ICD-10-CM

## 2024-07-05 DIAGNOSIS — R278 Other lack of coordination: Secondary | ICD-10-CM

## 2024-07-05 NOTE — Therapy (Signed)
 OUTPATIENT PHYSICAL THERAPY THORACOLUMBAR TREATMENT   Patient Name: Olivia Shah MRN: [id number] DOB:1955-10-06, 68 y.o., female Today's Date: 07/05/2024  END OF SESSION:  PT End of Session - 07/05/24 1709     Visit Number 5    Date for Recertification  08/27/24    PT Start Time 1710    PT Stop Time 1755    PT Time Calculation (min) 45 min    Activity Tolerance Patient tolerated treatment well    Behavior During Therapy [facility] for tasks assessed/performed             Past Medical History:  Diagnosis Date   Arthritis    Diabetes mellitus without complication (HCC)    Hypertension    Past Surgical History:  Procedure Laterality Date   APPLICATION OF CRANIAL NAVIGATION N/A 05/18/2021   Procedure: APPLICATION OF CRANIAL NAVIGATION;  Surgeon: Cheryle Debby LABOR, MD;  Location: [facility] OR;  Service: Neurosurgery;  Laterality: N/A;   BREAST BIOPSY Right    CHOLECYSTECTOMY     CRANIOTOMY Right 05/18/2021   Procedure: Right craniotomy for tumor resection;  Surgeon: Cheryle Debby LABOR, MD;  Location: [facility] OR;  Service: Neurosurgery;  Laterality: Right;   EYE SURGERY     as a child   HERNIA REPAIR     Patient Active Problem List   Diagnosis Date Noted   Abnormal findings on diagnostic imaging of other specified body structures 01/14/2022   Chronic pain 01/14/2022   Colon cancer screening 01/14/2022   Dysphagia 01/14/2022   Family history of malignant neoplasm of digestive organs 01/14/2022   Hyperglycemia due to type 2 diabetes mellitus (HCC) 01/14/2022   Hypertension 01/14/2022   Meralgia paresthetica 01/14/2022   Mixed hyperlipidemia 01/14/2022   Morbid obesity (HCC) 01/14/2022   Overweight 01/14/2022   Pure hypercholesterolemia 01/14/2022   Spinal cord disease (HCC) 01/14/2022   Chronic cough 12/01/2021   Diabetes (HCC) 07/08/2021   Glaucoma 07/08/2021   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   Visual field defect of left eye 07/08/2021   Debility 06/16/2021   Hypokalemia     Seizures (HCC)    Benign essential HTN    Controlled type 2 diabetes mellitus with hyperglycemia, without long-term current use of insulin  (HCC)    Stridor 06/05/2021   Status post tracheostomy (HCC)    Acute respiratory failure with hypoxia (HCC)    Status epilepticus (HCC)    Brain tumor (HCC) 05/18/2021    PCP: Tanda Bame  REFERRING PROVIDER: Arley Helling  REFERRING DIAG: spinal stenosis, lumbar region with neurogenic claudication   Rationale for Evaluation and Treatment: Rehabilitation  THERAPY DIAG:  Difficulty in walking, not elsewhere classified  Other lack of coordination  Muscle weakness (generalized)  Unsteadiness on feet  Other low back pain  Cramp and spasm  Chronic bilateral low back pain with bilateral sciatica  ONSET DATE: chronic  SUBJECTIVE:  SUBJECTIVE STATEMENT: Hurting pretty bad. I don't know what it is coming from unless it is the cold in the work building.    I got a shot August 18th and it lasted about a week but I still have pain in the legs. I am using the rollator outside the house and inside the work building. But inside the house using the cane and try to walk on her own. The rollator supports my back pretty good.   PERTINENT HISTORY:  See above  PAIN:  Are you having pain? Yes: NPRS scale: 3/10 Pain location: in my legs  Pain description: achy, dull, nagging  Aggravating factors: cold weather, standing and walking long periods  Relieving factors: Tylenol   PRECAUTIONS: Fall  RED FLAGS: None   WEIGHT BEARING RESTRICTIONS: No  FALLS:  Has patient fallen in last 6 months? 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  DIAGNOSTIC FINDINGS:  N/A  COGNITION: Overall cognitive status: Within functional limits for tasks assessed     SENSATION: WFL  POSTURE: rounded shoulders, forward head, and flexed trunk    LUMBAR ROM: *all the pain is in her legs*  AROM eval  Flexion Can touch floor but pain in legs  Extension 25% with pain in leg  Right lateral flexion WFL  Left lateral flexion WFL  Right rotation 50% with pain  Left rotation 50% with pain   (Blank rows = not tested)  LOWER EXTREMITY ROM:  WFL   LOWER EXTREMITY MMT:  4+/5 BLE   FUNCTIONAL TESTS:  5 times sit to stand: 23s Timed up and go (TUG): 24s  GAIT: Distance walked: in clinic distances Assistive device utilized: Environmental consultant - 4 wheeled Level of assistance: Complete Independence Comments: able to walk without AD but unsteady, antalgic, and slowed cadence and speed  TREATMENT DATE:  07/05/24 NuStep L5x15mins Leg ext 5# 2x10 HS curls 20# 2x10 Walking without rollator ~3ft x2 STS with yellow ball x10 5xSTS TUG   07/02/24 LAQ 3# 2x12 Standing march and hip abd 3# 2x10 HS curls green 2x10 Shoulder ext red 2x10 Standing rows red 2x10 NuStep L5x25mins    06/28/24 NuStep L5x46mins  Rotations with green band x10 STS 2x8 Standing march Supine feet on pball rotations and knees to chest LTR with band x10 Ball squeeze 2x10 Shoulder ext red 2x12   06/25/28 NuStep L 5 x 6 min Shoulder Ext red 2x10 Rows red 2x10 Seated HS curls green 2x10 each LAQ 2lb 2x10  Seated  march 2lb 2x10  06/04/24- EVAL  PATIENT EDUCATION:  Education details: POC Person educated: Patient Education method: Explanation Education comprehension: verbalized understanding  HOME EXERCISE PROGRAM: Access Code: [email or web address] URL: [email or web address] Date: 06/25/2024 Prepared by: Tanda Sorrow  Exercises - Supine Bridge  - 1 x daily - 7 x weekly - 3 sets - 10 reps - Standing Shoulder Extension with Resistance  - 1 x daily - 7 x weekly - 3 sets - 10 reps - Standing Row with Resistance  - 1 x daily - 7 x weekly - 3 sets - 10 reps  ASSESSMENT:  CLINICAL IMPRESSION: Patient is a 68 y.o. female who was seen today for physical therapy treatment for lumbar spondylosis and stenosis. She returns with high levels of pain in her legs today. She reports her back does not really hurt it is mostly just the legs. Usually it is in the front of her legs but  today she feels it in the hamstrings. Patient was able to do exercises today and even walked a few feet without the rollator which she did not think she could do. With STS she has a hard time standing full upright. She will benefit from PT to address her leg pain and increase her functional independence to be able to get around with more ease.   OBJECTIVE IMPAIRMENTS: Abnormal gait, decreased activity tolerance, decreased balance, difficulty walking, improper body mechanics, postural dysfunction, and pain.   ACTIVITY LIMITATIONS: carrying, lifting, bending, squatting, stairs, and locomotion level  PARTICIPATION LIMITATIONS: cleaning, laundry, shopping, community activity, occupation, and yard work  PERSONAL FACTORS: Age, Fitness, Past/current experiences, Time since onset of injury/illness/exacerbation, and Transportation are also affecting patient's functional outcome.   REHAB POTENTIAL: Good  CLINICAL DECISION MAKING: Stable/uncomplicated  EVALUATION COMPLEXITY: Low   GOALS: Goals reviewed with patient? Yes  SHORT TERM GOALS: Target date:  07/16/24  Patient will be independent with initial HEP. Baseline:  Goal status: IN PROGRESS 07/05/24  2.  Patient will demonstrate decreased fall risk by scoring <15 sec on TUG. Baseline: 24s Goal status: IN PROGRESS 15s w/rollator, 18s w/o    LONG TERM GOALS: Target date: 08/27/24  Patient will be independent with advanced/ongoing HEP to improve outcomes and carryover.  Baseline:  Goal status: INITIAL  2.  Patient will be able to ambulate 500' with LRAD with good safety to access community.  Baseline: using rollator  Goal status: INITIAL  3.  Patient will reports 50% improvement in leg pain Baseline:  Goal status: INITIAL   4.  Patient will demonstrate improved functional LE strength as demonstrated by 5xSTS <15s. Baseline: 23s Goal status: IN PROGRESS 21s 07/05/24  5.  Patient will complete 2 min walk test without assistive device in <1 min 30s Baseline:  Goal status: INITIAL   PLAN:  PT FREQUENCY: 2x/week  PT DURATION: 12 weeks  PLANNED INTERVENTIONS: 97110-Therapeutic exercises, 97530- Therapeutic activity, 97112- Neuromuscular re-education, 97535- Self Care, [id number]- Manual therapy, [id number]- Gait training, [phone]- Electrical stimulation (manual), [phone] (1-2 muscles), [id number] (3+ muscles)- Dry Needling, Patient/Family education, Balance training, Stair training, Taping, Joint mobilization, Spinal mobilization, Cryotherapy, and Moist heat.  PLAN FOR NEXT SESSION: update HEP, functional tasks   [place], PT 07/05/2024, 5:53 PM

## 2024-07-09 ENCOUNTER — Ambulatory Visit

## 2024-07-09 DIAGNOSIS — R262 Difficulty in walking, not elsewhere classified: Secondary | ICD-10-CM

## 2024-07-09 DIAGNOSIS — M6281 Muscle weakness (generalized): Secondary | ICD-10-CM

## 2024-07-09 DIAGNOSIS — R2681 Unsteadiness on feet: Secondary | ICD-10-CM

## 2024-07-09 DIAGNOSIS — M5459 Other low back pain: Secondary | ICD-10-CM

## 2024-07-09 DIAGNOSIS — R278 Other lack of coordination: Secondary | ICD-10-CM

## 2024-07-09 NOTE — Therapy (Signed)
 OUTPATIENT PHYSICAL THERAPY THORACOLUMBAR TREATMENT   Patient Name: DEVIN FOSKEY MRN: [id number] DOB:19-Jul-1956, 68 y.o., female Today's Date: 07/09/2024  END OF SESSION:  PT End of Session - 07/09/24 1053     Visit Number 6    Date for Recertification  08/27/24    PT Start Time 1050    PT Stop Time 1135    PT Time Calculation (min) 45 min    Activity Tolerance Patient tolerated treatment well    Behavior During Therapy [facility] for tasks assessed/performed              Past Medical History:  Diagnosis Date   Arthritis    Diabetes mellitus without complication (HCC)    Hypertension    Past Surgical History:  Procedure Laterality Date   APPLICATION OF CRANIAL NAVIGATION N/A 05/18/2021   Procedure: APPLICATION OF CRANIAL NAVIGATION;  Surgeon: Cheryle Debby LABOR, MD;  Location: [facility] OR;  Service: Neurosurgery;  Laterality: N/A;   BREAST BIOPSY Right    CHOLECYSTECTOMY     CRANIOTOMY Right 05/18/2021   Procedure: Right craniotomy for tumor resection;  Surgeon: Cheryle Debby LABOR, MD;  Location: [facility] OR;  Service: Neurosurgery;  Laterality: Right;   EYE SURGERY     as a child   HERNIA REPAIR     Patient Active Problem List   Diagnosis Date Noted   Abnormal findings on diagnostic imaging of other specified body structures 01/14/2022   Chronic pain 01/14/2022   Colon cancer screening 01/14/2022   Dysphagia 01/14/2022   Family history of malignant neoplasm of digestive organs 01/14/2022   Hyperglycemia due to type 2 diabetes mellitus (HCC) 01/14/2022   Hypertension 01/14/2022   Meralgia paresthetica 01/14/2022   Mixed hyperlipidemia 01/14/2022   Morbid obesity (HCC) 01/14/2022   Overweight 01/14/2022   Pure hypercholesterolemia 01/14/2022   Spinal cord disease (HCC) 01/14/2022   Chronic cough 12/01/2021   Diabetes (HCC) 07/08/2021   Glaucoma 07/08/2021   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   Visual field defect of left eye 07/08/2021   Debility 06/16/2021   Hypokalemia     Seizures (HCC)    Benign essential HTN    Controlled type 2 diabetes mellitus with hyperglycemia, without long-term current use of insulin  (HCC)    Stridor 06/05/2021   Status post tracheostomy (HCC)    Acute respiratory failure with hypoxia (HCC)    Status epilepticus (HCC)    Brain tumor (HCC) 05/18/2021    PCP: Tanda Bame  REFERRING PROVIDER: Arley Helling  REFERRING DIAG: spinal stenosis, lumbar region with neurogenic claudication   Rationale for Evaluation and Treatment: Rehabilitation  THERAPY DIAG:  Difficulty in walking, not elsewhere classified  Other lack of coordination  Muscle weakness (generalized)  Other low back pain  Unsteadiness on feet  ONSET DATE: chronic  SUBJECTIVE:  SUBJECTIVE STATEMENT: The R leg is hurting but not as bad as it was. The back is okay, it hurts a when I bend a little.    I got a shot August 18th and it lasted about a week but I still have pain in the legs. I am using the rollator outside the house and inside the work building. But inside the house using the cane and try to walk on her own. The rollator supports my back pretty good.   PERTINENT HISTORY:  See above  PAIN:  Are you having pain? Yes: NPRS scale: 3/10 Pain location: in my legs  Pain description: achy, dull, nagging  Aggravating factors: cold weather, standing and walking long periods  Relieving factors: Tylenol   PRECAUTIONS: Fall  RED FLAGS: None   WEIGHT BEARING RESTRICTIONS: No  FALLS:  Has patient fallen in last 6 months? 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  DIAGNOSTIC FINDINGS:  N/A  COGNITION: Overall cognitive status: Within functional limits for tasks assessed     SENSATION: WFL  POSTURE: rounded shoulders, forward head, and flexed trunk    LUMBAR ROM: *all the pain is in her legs*  AROM eval  Flexion Can touch floor but pain in legs  Extension 25% with pain in leg  Right lateral flexion WFL  Left lateral flexion WFL  Right rotation 50% with pain  Left rotation 50% with pain   (Blank rows = not tested)  LOWER EXTREMITY ROM:  WFL   LOWER EXTREMITY MMT:  4+/5 BLE   FUNCTIONAL TESTS:  5 times sit to stand: 23s Timed up and go (TUG): 24s  GAIT: Distance walked: in clinic distances Assistive device utilized: Environmental consultant - 4 wheeled Level of assistance: Complete Independence Comments: able to walk without AD but unsteady, antalgic, and slowed cadence and speed  TREATMENT DATE:  07/09/24 NuStep L5x20mins  Step taps 4 4 step ups Side steps on beam STS with OHP red press 2x10 Standing march 5# to 6  Seated taps to 6 with 5# to the side   07/05/24 NuStep L5x49mins Leg ext 5# 2x10 HS curls 20# 2x10 Walking without rollator ~30ft x2 STS with yellow ball x10 5xSTS TUG   07/02/24 LAQ 3# 2x12 Standing march and hip abd 3# 2x10 HS curls green 2x10 Shoulder ext red 2x10 Standing rows red 2x10 NuStep L5x79mins    06/28/24 NuStep L5x78mins  Rotations with green band x10 STS 2x8 Standing march Supine feet on pball rotations and knees to chest LTR with band x10 Ball squeeze 2x10 Shoulder ext red 2x12   06/25/28 NuStep L 5 x 6 min Shoulder Ext red 2x10 Rows red 2x10 Seated HS curls green 2x10 each LAQ 2lb 2x10  Seated  march 2lb 2x10  06/04/24- EVAL  PATIENT EDUCATION:  Education details: POC Person educated: Patient Education method: Explanation Education comprehension: verbalized understanding  HOME EXERCISE PROGRAM: Access Code: [email or web address] URL: [email or web address] Date: 06/25/2024 Prepared by: Tanda Sorrow  Exercises - Supine Bridge  - 1 x daily - 7 x weekly - 3 sets - 10 reps - Standing Shoulder Extension with Resistance  - 1 x daily - 7 x weekly - 3 sets - 10 reps - Standing Row with Resistance  - 1 x daily - 7 x weekly - 3 sets - 10 reps  ASSESSMENT:  CLINICAL IMPRESSION: Patient is a 68 y.o. female who was seen today for physical therapy treatment for lumbar spondylosis and stenosis. She returns with ongoing pain in her R leg. She does state it is a little better today. She is wheezing a little today and reports she did a albuterol breathing treatment over the weekend. Pt had some pain in R leg with side steps on beam. Has trouble lifting the LLE with 5#, states it feel heavy. Might be due to pain and weight shifting on the R side. Does well lifting RLE. She will benefit from PT to address her leg pain and increase her functional independence to be able to get around with more ease.   OBJECTIVE IMPAIRMENTS: Abnormal gait, decreased activity tolerance, decreased balance, difficulty walking, improper body mechanics, postural dysfunction, and pain.   ACTIVITY LIMITATIONS: carrying, lifting, bending, squatting, stairs, and locomotion level  PARTICIPATION LIMITATIONS: cleaning, laundry, shopping, community activity, occupation, and yard work  PERSONAL FACTORS: Age, Fitness, Past/current experiences, Time since onset of injury/illness/exacerbation, and Transportation are also affecting patient's functional outcome.   REHAB POTENTIAL: Good  CLINICAL DECISION MAKING: Stable/uncomplicated  EVALUATION COMPLEXITY:  Low   GOALS: Goals reviewed with patient? Yes  SHORT TERM GOALS: Target date: 07/16/24  Patient will be independent with initial HEP. Baseline:  Goal status: IN PROGRESS 07/05/24  2.  Patient will demonstrate decreased fall risk by scoring <15 sec on TUG. Baseline: 24s Goal status: IN PROGRESS 15s w/rollator, 18s w/o    LONG TERM GOALS: Target date: 08/27/24  Patient will be independent with advanced/ongoing HEP to improve outcomes and carryover.  Baseline:  Goal status: INITIAL  2.  Patient will be able to ambulate 500' with LRAD with good safety to access community.  Baseline: using rollator  Goal status: INITIAL  3.  Patient will reports 50% improvement in leg pain Baseline:  Goal status: INITIAL   4.  Patient will demonstrate improved functional LE strength as demonstrated by 5xSTS <15s. Baseline: 23s Goal status: IN PROGRESS 21s 07/05/24  5.  Patient will complete 2 min walk test without assistive device in <1 min 30s Baseline:  Goal status: INITIAL   PLAN:  PT FREQUENCY: 2x/week  PT DURATION: 12 weeks  PLANNED INTERVENTIONS: 97110-Therapeutic exercises, 97530- Therapeutic activity, 97112- Neuromuscular re-education, 97535- Self Care, 02859- Manual therapy, [id number]- Gait training, [phone]- Electrical stimulation (manual), [phone] (1-2 muscles), 20561 (3+ muscles)- Dry Needling, Patient/Family education, Balance training, Stair training, Taping, Joint mobilization, Spinal mobilization, Cryotherapy, and Moist heat.  PLAN FOR NEXT SESSION: update HEP, functional tasks   [place], PT 07/09/2024, 11:36 AM

## 2024-07-12 ENCOUNTER — Ambulatory Visit

## 2024-07-12 DIAGNOSIS — M5459 Other low back pain: Secondary | ICD-10-CM

## 2024-07-12 DIAGNOSIS — R252 Cramp and spasm: Secondary | ICD-10-CM

## 2024-07-12 DIAGNOSIS — R2681 Unsteadiness on feet: Secondary | ICD-10-CM

## 2024-07-12 DIAGNOSIS — R262 Difficulty in walking, not elsewhere classified: Secondary | ICD-10-CM

## 2024-07-12 DIAGNOSIS — G8929 Other chronic pain: Secondary | ICD-10-CM

## 2024-07-12 DIAGNOSIS — M6281 Muscle weakness (generalized): Secondary | ICD-10-CM

## 2024-07-12 DIAGNOSIS — R278 Other lack of coordination: Secondary | ICD-10-CM

## 2024-07-12 NOTE — Therapy (Signed)
 OUTPATIENT PHYSICAL THERAPY THORACOLUMBAR TREATMENT   Patient Name: Olivia Shah MRN: [id number] DOB:[birth year], 68 y.o., female Today's Date: 07/12/2024  END OF SESSION:  PT End of Session - 07/12/24 1713     Visit Number 7    Date for Recertification  08/27/24    PT Start Time 1712    PT Stop Time 1755    PT Time Calculation (min) 43 min    Activity Tolerance Patient tolerated treatment well    Behavior During Therapy [facility] for tasks assessed/performed               Past Medical History:  Diagnosis Date   Arthritis    Diabetes mellitus without complication (HCC)    Hypertension    Past Surgical History:  Procedure Laterality Date   APPLICATION OF CRANIAL NAVIGATION N/A 05/18/2021   Procedure: APPLICATION OF CRANIAL NAVIGATION;  Surgeon: Cheryle Debby LABOR, MD;  Location: [facility] OR;  Service: Neurosurgery;  Laterality: N/A;   BREAST BIOPSY Right    CHOLECYSTECTOMY     CRANIOTOMY Right 05/18/2021   Procedure: Right craniotomy for tumor resection;  Surgeon: Cheryle Debby LABOR, MD;  Location: [facility] OR;  Service: Neurosurgery;  Laterality: Right;   EYE SURGERY     as a child   HERNIA REPAIR     Patient Active Problem List   Diagnosis Date Noted   Abnormal findings on diagnostic imaging of other specified body structures 01/14/2022   Chronic pain 01/14/2022   Colon cancer screening 01/14/2022   Dysphagia 01/14/2022   Family history of malignant neoplasm of digestive organs 01/14/2022   Hyperglycemia due to type 2 diabetes mellitus (HCC) 01/14/2022   Hypertension 01/14/2022   Meralgia paresthetica 01/14/2022   Mixed hyperlipidemia 01/14/2022   Morbid obesity (HCC) 01/14/2022   Overweight 01/14/2022   Pure hypercholesterolemia 01/14/2022   Spinal cord disease (HCC) 01/14/2022   Chronic cough 12/01/2021   Diabetes (HCC) 07/08/2021   Glaucoma 07/08/2021   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   Visual field defect of left eye 07/08/2021   Debility 06/16/2021   Hypokalemia     Seizures (HCC)    Benign essential HTN    Controlled type 2 diabetes mellitus with hyperglycemia, without long-term current use of insulin  (HCC)    Stridor 06/05/2021   Status post tracheostomy (HCC)    Acute respiratory failure with hypoxia (HCC)    Status epilepticus (HCC)    Brain tumor (HCC) 05/18/2021    PCP: Tanda Bame  REFERRING PROVIDER: Arley Helling  REFERRING DIAG: spinal stenosis, lumbar region with neurogenic claudication   Rationale for Evaluation and Treatment: Rehabilitation  THERAPY DIAG:  Difficulty in walking, not elsewhere classified  Other lack of coordination  Muscle weakness (generalized)  Other low back pain  Chronic bilateral low back pain with bilateral sciatica  Cramp and spasm  Unsteadiness on feet  ONSET DATE: chronic  SUBJECTIVE:  SUBJECTIVE STATEMENT: I was in pain so I took Tylenol , not hurting that much right now.    I got a shot August 18th and it lasted about a week but I still have pain in the legs. I am using the rollator outside the house and inside the work building. But inside the house using the cane and try to walk on her own. The rollator supports my back pretty good.   PERTINENT HISTORY:  See above  PAIN:  Are you having pain? Yes: NPRS scale: 3/10 Pain location: in my legs  Pain description: achy, dull, nagging  Aggravating factors: cold weather, standing and walking long periods  Relieving factors: Tylenol   PRECAUTIONS: Fall  RED FLAGS: None   WEIGHT BEARING RESTRICTIONS: No  FALLS:  Has patient fallen in last 6 months? 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  DIAGNOSTIC FINDINGS:  N/A  COGNITION: Overall cognitive status: Within functional limits for tasks assessed     SENSATION: WFL  POSTURE: rounded shoulders, forward head, and flexed trunk    LUMBAR ROM: *all the pain is in her legs*  AROM eval  Flexion Can touch floor but pain in legs  Extension 25% with pain in leg  Right lateral flexion WFL  Left lateral flexion WFL  Right rotation 50% with pain  Left rotation 50% with pain   (Blank rows = not tested)  LOWER EXTREMITY ROM:  WFL   LOWER EXTREMITY MMT:  4+/5 BLE   FUNCTIONAL TESTS:  5 times sit to stand: 23s Timed up and go (TUG): 24s  GAIT: Distance walked: in clinic distances Assistive device utilized: Environmental consultant - 4 wheeled Level of assistance: Complete Independence Comments: able to walk without AD but unsteady, antalgic, and slowed cadence and speed  TREATMENT DATE:  07/12/24 NuStep L5x30mins Calf stretch on bar 20s x2  Calf raises 2x10 Toe raises 2x10 2HHA walking for speed 58ft x2 Step ups 4 Supine LE stretching Feet on pball rotations and knees to chest x10 Supine hip add with ball x10, then tried with bridge but unable to do   07/09/24 NuStep L5x54mins  Step taps 4 4 step ups Side steps on beam STS with OHP red press 2x10 Standing march 5# to 6  Seated taps to 6 with 5# to the side   07/05/24 NuStep L5x53mins Leg ext 5# 2x10 HS curls 20# 2x10 Walking without rollator ~74ft x2 STS with yellow ball x10 5xSTS TUG   07/02/24 LAQ 3# 2x12 Standing march and hip abd 3# 2x10 HS curls green 2x10 Shoulder ext red 2x10 Standing rows red 2x10 NuStep L5x76mins    06/28/24 NuStep L5x79mins  Rotations with green band  x10 STS 2x8 Standing march Supine feet on pball rotations and knees to chest LTR with band x10 Ball squeeze 2x10 Shoulder ext red 2x12   06/25/28 NuStep L 5 x 6 min Shoulder Ext red 2x10 Rows red 2x10 Seated HS curls green 2x10 each LAQ 2lb 2x10  Seated  march 2lb 2x10  06/04/24- EVAL  PATIENT EDUCATION:  Education details: POC Person educated: Patient Education method: Explanation Education comprehension: verbalized understanding  HOME EXERCISE PROGRAM: Access Code: [email or web address] URL: [email or web address] Date: 06/25/2024 Prepared by: Tanda Sorrow  Exercises - Supine Bridge  - 1 x daily - 7 x weekly - 3 sets - 10 reps - Standing Shoulder Extension with Resistance  - 1 x daily - 7 x weekly - 3 sets - 10 reps - Standing Row with Resistance  - 1 x daily - 7 x weekly - 3 sets - 10 reps  ASSESSMENT:  CLINICAL IMPRESSION: Patient is a 68 y.o. female who was seen today for physical therapy treatment for lumbar spondylosis and stenosis. Her pain is some better today, she reports taking a Tylenol  before coming because she was have a lot of pain earlier in the day. Does well with fast walking with PT assistance. Reports some stinging in the back of her legs with knee flexion using pball. Unable to do bridges today due to pain in back. She will benefit from PT to address her leg pain and increase her functional independence to be able to get around with more ease.   OBJECTIVE IMPAIRMENTS: Abnormal gait, decreased activity tolerance, decreased balance, difficulty walking, improper body mechanics, postural dysfunction, and pain.   ACTIVITY LIMITATIONS: carrying, lifting, bending, squatting, stairs, and locomotion level  PARTICIPATION LIMITATIONS: cleaning, laundry, shopping, community activity, occupation, and yard work  PERSONAL FACTORS: Age,  Fitness, Past/current experiences, Time since onset of injury/illness/exacerbation, and Transportation are also affecting patient's functional outcome.   REHAB POTENTIAL: Good  CLINICAL DECISION MAKING: Stable/uncomplicated  EVALUATION COMPLEXITY: Low   GOALS: Goals reviewed with patient? Yes  SHORT TERM GOALS: Target date: 07/16/24  Patient will be independent with initial HEP. Baseline:  Goal status: IN PROGRESS 07/05/24  2.  Patient will demonstrate decreased fall risk by scoring <15 sec on TUG. Baseline: 24s Goal status: IN PROGRESS 15s w/rollator, 18s w/o 07/12/24   LONG TERM GOALS: Target date: 08/27/24  Patient will be independent with advanced/ongoing HEP to improve outcomes and carryover.  Baseline:  Goal status: INITIAL  2.  Patient will be able to ambulate 500' with LRAD with good safety to access community.  Baseline: using rollator  Goal status: INITIAL  3.  Patient will reports 50% improvement in leg pain Baseline:  Goal status: INITIAL   4.  Patient will demonstrate improved functional LE strength as demonstrated by 5xSTS <15s. Baseline: 23s Goal status: IN PROGRESS 21s 07/05/24  5.  Patient will complete 2 min walk test without assistive device in <1 min 30s Baseline:  Goal status: INITIAL   PLAN:  PT FREQUENCY: 2x/week  PT DURATION: 12 weeks  PLANNED INTERVENTIONS: 97110-Therapeutic exercises, 97530- Therapeutic activity, 97112- Neuromuscular re-education, 97535- Self Care, 02859- Manual therapy, [id number]- Gait training, [phone]- Electrical stimulation (manual), [phone] (1-2 muscles), 20561 (3+ muscles)- Dry Needling, Patient/Family education, Balance training, Stair training, Taping, Joint mobilization, Spinal mobilization, Cryotherapy, and Moist heat.  PLAN FOR NEXT SESSION: update HEP, functional tasks   [place], PT 07/12/2024, 5:55 PM

## 2024-07-16 ENCOUNTER — Ambulatory Visit: Admitting: Physical Therapy

## 2024-07-19 ENCOUNTER — Ambulatory Visit

## 2024-07-19 DIAGNOSIS — R262 Difficulty in walking, not elsewhere classified: Secondary | ICD-10-CM

## 2024-07-19 DIAGNOSIS — R278 Other lack of coordination: Secondary | ICD-10-CM

## 2024-07-19 DIAGNOSIS — R252 Cramp and spasm: Secondary | ICD-10-CM

## 2024-07-19 DIAGNOSIS — M6281 Muscle weakness (generalized): Secondary | ICD-10-CM

## 2024-07-19 NOTE — Therapy (Signed)
 OUTPATIENT PHYSICAL THERAPY THORACOLUMBAR TREATMENT   Patient Name: Olivia Shah MRN: [id number] DOB:Jul 01, 1956, 68 y.o., female Today's Date: 07/19/2024  END OF SESSION:  PT End of Session - 07/19/24 1715     Visit Number 8    Date for Recertification  08/27/24    PT Start Time 1715    PT Stop Time 1800    PT Time Calculation (min) 45 min    Activity Tolerance Patient tolerated treatment well    Behavior During Therapy [facility] for tasks assessed/performed                Past Medical History:  Diagnosis Date   Arthritis    Diabetes mellitus without complication (HCC)    Hypertension    Past Surgical History:  Procedure Laterality Date   APPLICATION OF CRANIAL NAVIGATION N/A 05/18/2021   Procedure: APPLICATION OF CRANIAL NAVIGATION;  Surgeon: Cheryle Debby LABOR, MD;  Location: [facility] OR;  Service: Neurosurgery;  Laterality: N/A;   BREAST BIOPSY Right    CHOLECYSTECTOMY     CRANIOTOMY Right 05/18/2021   Procedure: Right craniotomy for tumor resection;  Surgeon: Cheryle Debby LABOR, MD;  Location: [facility] OR;  Service: Neurosurgery;  Laterality: Right;   EYE SURGERY     as a child   HERNIA REPAIR     Patient Active Problem List   Diagnosis Date Noted   Abnormal findings on diagnostic imaging of other specified body structures 01/14/2022   Chronic pain 01/14/2022   Colon cancer screening 01/14/2022   Dysphagia 01/14/2022   Family history of malignant neoplasm of digestive organs 01/14/2022   Hyperglycemia due to type 2 diabetes mellitus (HCC) 01/14/2022   Hypertension 01/14/2022   Meralgia paresthetica 01/14/2022   Mixed hyperlipidemia 01/14/2022   Morbid obesity (HCC) 01/14/2022   Overweight 01/14/2022   Pure hypercholesterolemia 01/14/2022   Spinal cord disease (HCC) 01/14/2022   Chronic cough 12/01/2021   Diabetes (HCC) 07/08/2021   Glaucoma 07/08/2021   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   Visual field defect of left eye 07/08/2021   Debility 06/16/2021   Hypokalemia     Seizures (HCC)    Benign essential HTN    Controlled type 2 diabetes mellitus with hyperglycemia, without long-term current use of insulin  (HCC)    Stridor 06/05/2021   Status post tracheostomy (HCC)    Acute respiratory failure with hypoxia (HCC)    Status epilepticus (HCC)    Brain tumor (HCC) 05/18/2021    PCP: Tanda Bame  REFERRING PROVIDER: Arley Helling  REFERRING DIAG: spinal stenosis, lumbar region with neurogenic claudication   Rationale for Evaluation and Treatment: Rehabilitation  THERAPY DIAG:  Difficulty in walking, not elsewhere classified  Other lack of coordination  Cramp and spasm  Muscle weakness (generalized)  ONSET DATE: chronic  SUBJECTIVE:  SUBJECTIVE STATEMENT: The R leg is hurting.   I got a shot August 18th and it lasted about a week but I still have pain in the legs. I am using the rollator outside the house and inside the work building. But inside the house using the cane and try to walk on her own. The rollator supports my back pretty good.   PERTINENT HISTORY:  See above  PAIN:  Are you having pain? Yes: NPRS scale: 3/10 Pain location: in my legs  Pain description: achy, dull, nagging  Aggravating factors: cold weather, standing and walking long periods  Relieving factors: Tylenol   PRECAUTIONS: Fall  RED FLAGS: None   WEIGHT BEARING RESTRICTIONS: No  FALLS:  Has patient fallen in last 6 months? 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  DIAGNOSTIC FINDINGS:  N/A  COGNITION: Overall cognitive status: Within functional limits for tasks assessed     SENSATION: WFL  POSTURE: rounded shoulders, forward head, and flexed trunk    LUMBAR ROM: *all the pain is in her legs*  AROM eval  Flexion Can touch floor but pain in legs  Extension 25% with pain in leg  Right lateral flexion WFL  Left lateral flexion WFL  Right rotation 50% with pain  Left rotation 50% with pain   (Blank rows = not tested)  LOWER EXTREMITY ROM:  WFL   LOWER EXTREMITY MMT:  4+/5 BLE   FUNCTIONAL TESTS:  5 times sit to stand: 23s Timed up and go (TUG): 24s  GAIT: Distance walked: in clinic distances Assistive device utilized: Environmental consultant - 4 wheeled Level of assistance: Complete Independence Comments: able to walk without AD but unsteady, antalgic, and slowed cadence and speed  TREATMENT DATE:  07/19/24 Side steps over obstacle with HHA  3# walking marches forwards and backwards  NuStep L5x82mins Leg ext 10# 2x10 HS curls 25# 2x10 STS holding yellow ball 2x10  07/12/24 NuStep L5x38mins Calf stretch on bar 20s x2  Calf raises 2x10 Toe raises 2x10 2HHA walking for speed 45ft x2 Step ups 4 Supine LE stretching Feet on pball rotations and knees to chest x10 Supine hip add with ball x10, then tried with bridge but unable to do   07/09/24 NuStep L5x67mins  Step taps 4 4 step ups Side steps on beam STS with OHP red press 2x10 Standing march 5# to 6  Seated taps to 6 with 5# to the side   07/05/24 NuStep L5x33mins Leg ext 5# 2x10 HS curls 20# 2x10 Walking without rollator ~39ft x2 STS with yellow ball x10 5xSTS TUG   07/02/24 LAQ 3# 2x12 Standing march and hip abd 3# 2x10 HS curls green 2x10 Shoulder ext red 2x10 Standing rows red 2x10 NuStep L5x54mins    06/28/24 NuStep L5x63mins  Rotations with  green band x10 STS 2x8 Standing march Supine feet on pball rotations and knees to chest LTR with band x10 Ball squeeze 2x10 Shoulder ext red 2x12   06/25/28 NuStep L 5 x 6 min Shoulder Ext red 2x10 Rows red 2x10 Seated HS curls green 2x10 each LAQ 2lb 2x10  Seated  march 2lb 2x10  06/04/24- EVAL                                                                                                                                 PATIENT EDUCATION:  Education details: POC Person educated: Patient Education method: Explanation Education comprehension: verbalized understanding  HOME EXERCISE PROGRAM: Access Code: [email or web address] URL: [email or web address] Date: 06/25/2024 Prepared by: Tanda Sorrow  Exercises - Supine Bridge  - 1 x daily - 7 x weekly - 3 sets - 10 reps - Standing Shoulder Extension with Resistance  - 1 x daily - 7 x weekly - 3 sets - 10 reps - Standing Row with Resistance  - 1 x daily - 7 x weekly - 3 sets - 10 reps  ASSESSMENT:  CLINICAL IMPRESSION: Patient is a 68 y.o. female who was seen today for physical therapy treatment for lumbar spondylosis and stenosis. She reports R leg pain today. With walking marches she has difficulty get up the LLE due to pain in the R side when weight bearing on it. Patient reports having a harder time today with exercises, but is unsure why. She will benefit from PT to address her leg pain and increase her functional independence to be able to get around with more ease.     OBJECTIVE IMPAIRMENTS: Abnormal gait, decreased activity tolerance, decreased balance, difficulty walking, improper body mechanics, postural dysfunction, and pain.   ACTIVITY LIMITATIONS: carrying, lifting, bending, squatting, stairs, and locomotion level  PARTICIPATION LIMITATIONS: cleaning, laundry, shopping, community activity, occupation, and yard work  PERSONAL FACTORS: Age, Fitness, Past/current experiences, Time since onset of  injury/illness/exacerbation, and Transportation are also affecting patient's functional outcome.   REHAB POTENTIAL: Good  CLINICAL DECISION MAKING: Stable/uncomplicated  EVALUATION COMPLEXITY: Low   GOALS: Goals reviewed with patient? Yes  SHORT TERM GOALS: Target date: 07/16/24  Patient will be independent with initial HEP. Baseline:  Goal status: IN PROGRESS 07/05/24  2.  Patient will demonstrate decreased fall risk by scoring <15 sec on TUG. Baseline: 24s Goal status: IN PROGRESS 15s w/rollator, 18s w/o 07/12/24   LONG TERM GOALS: Target date: 08/27/24  Patient will be independent with advanced/ongoing HEP to improve outcomes and carryover.  Baseline:  Goal status: INITIAL  2.  Patient will be able to ambulate 500' with LRAD with good safety to access community.  Baseline: using rollator  Goal status: INITIAL  3.  Patient will reports 50% improvement in leg pain Baseline:  Goal status: INITIAL   4.  Patient will demonstrate improved functional LE strength as demonstrated by 5xSTS <15s. Baseline: 23s Goal status: IN PROGRESS 21s 07/05/24  5.  Patient will complete 2 min walk test without assistive device in <1 min 30s Baseline:  Goal status: INITIAL   PLAN:  PT FREQUENCY: 2x/week  PT DURATION: 12 weeks  PLANNED INTERVENTIONS: 97110-Therapeutic exercises, 97530- Therapeutic activity, [id number]- Neuromuscular re-education, 97535- Self Care, 02859- Manual therapy, [id number]- Gait  training, 02967- Electrical stimulation (manual), [phone] (1-2 muscles), [id number] (3+ muscles)- Dry Needling, Patient/Family education, Balance training, Stair training, Taping, Joint mobilization, Spinal mobilization, Cryotherapy, and Moist heat.  PLAN FOR NEXT SESSION: update HEP, functional tasks   [place], PT 07/19/2024, 5:59 PM

## 2024-08-02 ENCOUNTER — Ambulatory Visit: Attending: Neurosurgery | Admitting: Physical Therapy

## 2024-08-02 DIAGNOSIS — R2681 Unsteadiness on feet: Secondary | ICD-10-CM | POA: Diagnosis present

## 2024-08-02 DIAGNOSIS — R278 Other lack of coordination: Secondary | ICD-10-CM | POA: Insufficient documentation

## 2024-08-02 DIAGNOSIS — M5442 Lumbago with sciatica, left side: Secondary | ICD-10-CM | POA: Diagnosis present

## 2024-08-02 DIAGNOSIS — G8929 Other chronic pain: Secondary | ICD-10-CM | POA: Diagnosis present

## 2024-08-02 DIAGNOSIS — M6281 Muscle weakness (generalized): Secondary | ICD-10-CM | POA: Diagnosis present

## 2024-08-02 DIAGNOSIS — R262 Difficulty in walking, not elsewhere classified: Secondary | ICD-10-CM | POA: Diagnosis present

## 2024-08-02 DIAGNOSIS — R252 Cramp and spasm: Secondary | ICD-10-CM | POA: Diagnosis present

## 2024-08-02 DIAGNOSIS — M5459 Other low back pain: Secondary | ICD-10-CM | POA: Diagnosis present

## 2024-08-02 DIAGNOSIS — M5441 Lumbago with sciatica, right side: Secondary | ICD-10-CM | POA: Diagnosis present

## 2024-08-02 NOTE — Therapy (Addendum)
 OUTPATIENT PHYSICAL THERAPY THORACOLUMBAR TREATMENT   Patient Name: Olivia Shah MRN: [id number] DOB:12/31/1955, 68 y.o., female Today's Date: 08/02/2024  END OF SESSION:  PT End of Session - 08/02/24 1703     Visit Number 9    Date for Recertification  08/27/24    Authorization Type Medicare    PT Start Time 1700    PT Stop Time 1745    PT Time Calculation (min) 45 min                Past Medical History:  Diagnosis Date   Arthritis    Diabetes mellitus without complication (HCC)    Hypertension    Past Surgical History:  Procedure Laterality Date   APPLICATION OF CRANIAL NAVIGATION N/A 05/18/2021   Procedure: APPLICATION OF CRANIAL NAVIGATION;  Surgeon: Cheryle Debby LABOR, MD;  Location: [facility] OR;  Service: Neurosurgery;  Laterality: N/A;   BREAST BIOPSY Right    CHOLECYSTECTOMY     CRANIOTOMY Right 05/18/2021   Procedure: Right craniotomy for tumor resection;  Surgeon: Cheryle Debby LABOR, MD;  Location: [facility] OR;  Service: Neurosurgery;  Laterality: Right;   EYE SURGERY     as a child   HERNIA REPAIR     Patient Active Problem List   Diagnosis Date Noted   Abnormal findings on diagnostic imaging of other specified body structures 01/14/2022   Chronic pain 01/14/2022   Colon cancer screening 01/14/2022   Dysphagia 01/14/2022   Family history of malignant neoplasm of digestive organs 01/14/2022   Hyperglycemia due to type 2 diabetes mellitus (HCC) 01/14/2022   Hypertension 01/14/2022   Meralgia paresthetica 01/14/2022   Mixed hyperlipidemia 01/14/2022   Morbid obesity (HCC) 01/14/2022   Overweight 01/14/2022   Pure hypercholesterolemia 01/14/2022   Spinal cord disease (HCC) 01/14/2022   Chronic cough 12/01/2021   Diabetes (HCC) 07/08/2021   Glaucoma 07/08/2021   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   Visual field defect of left eye 07/08/2021   Debility 06/16/2021   Hypokalemia    Seizures (HCC)    Benign essential HTN    Controlled type 2 diabetes mellitus  with hyperglycemia, without long-term current use of insulin  (HCC)    Stridor 06/05/2021   Status post tracheostomy (HCC)    Acute respiratory failure with hypoxia (HCC)    Status epilepticus (HCC)    Brain tumor (HCC) 05/18/2021    PCP: Tanda Bame  REFERRING PROVIDER: Arley Helling  REFERRING DIAG: spinal stenosis, lumbar region with neurogenic claudication   Rationale for Evaluation and Treatment: Rehabilitation  THERAPY DIAG:  Difficulty in walking, not elsewhere classified  Other lack of coordination  Cramp and spasm  Muscle weakness (generalized)  Other low back pain  Chronic bilateral low back pain with bilateral sciatica  ONSET DATE: chronic  SUBJECTIVE:  SUBJECTIVE STATEMENT: BIL LE pain and if I stand up straight increases pain and LBP  I got a shot August 18th and it lasted about a week but I still have pain in the legs. I am using the rollator outside the house and inside the work building. But inside the house using the cane and try to walk on her own. The rollator supports my back pretty good.   PERTINENT HISTORY:  See above  PAIN:  Are you having pain? Yes: NPRS scale: 5/10 Pain location: in my legs  Pain description: achy, dull, nagging  Aggravating factors: cold weather, standing and walking long periods  Relieving factors: Tylenol   PRECAUTIONS: Fall  RED FLAGS: None   WEIGHT BEARING RESTRICTIONS: No  FALLS:  Has patient fallen in last 6 months? 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  DIAGNOSTIC FINDINGS:  N/A  COGNITION: Overall cognitive status: Within functional limits for tasks assessed     SENSATION: WFL  POSTURE: rounded shoulders, forward head, and flexed trunk    LUMBAR ROM: *all the pain is in her legs*  AROM eval  Flexion Can touch floor but pain in legs  Extension 25% with pain in leg  Right lateral flexion WFL  Left lateral flexion WFL  Right rotation 50% with pain  Left rotation 50% with pain   (Blank rows = not tested)  LOWER EXTREMITY ROM:  WFL   LOWER EXTREMITY MMT:  4+/5 BLE   FUNCTIONAL TESTS:  5 times sit to stand: 23s Timed up and go (TUG): 24s  GAIT: Distance walked: in clinic distances Assistive device utilized: Environmental Consultant - 4 wheeled Level of assistance: Complete Independence Comments: able to walk without AD but unsteady, antalgic, and slowed cadence and speed  TREATMENT DATE:   08/02/24 Nustep L 5 Assessed goals Performed and issued ACCESS CODE; [id number] Standing red tband shld ext and row 2 sets 10- cued to stand up tall Wt ball seated core ex- OH and rotation   07/19/24 Side steps over obstacle with HHA  3# walking marches forwards and backwards  NuStep L5x54mins Leg ext 10# 2x10 HS curls 25# 2x10 STS holding yellow ball 2x10  07/12/24 NuStep L5x12mins Calf stretch on bar 20s x2  Calf raises 2x10 Toe raises 2x10 2HHA walking for speed 49ft x2 Step ups 4 Supine LE stretching Feet on pball rotations and knees to chest x10 Supine hip add with ball x10, then tried with bridge but unable to do   07/09/24 NuStep L5x67mins  Step taps 4 4 step ups Side steps on beam STS with OHP red press 2x10 Standing march 5# to 6  Seated taps to 6 with 5# to the side   07/05/24 NuStep L5x22mins Leg ext 5# 2x10 HS curls 20# 2x10 Walking without  rollator ~60ft x2 STS with yellow ball x10 5xSTS TUG   07/02/24 LAQ 3# 2x12 Standing march and hip abd 3# 2x10 HS curls green 2x10 Shoulder ext red 2x10 Standing rows red 2x10 NuStep L5x72mins    06/28/24 NuStep L5x59mins  Rotations with green band x10 STS 2x8 Standing  march Supine feet on pball rotations and knees to chest LTR with band x10 Ball squeeze 2x10 Shoulder ext red 2x12   06/25/28 NuStep L 5 x 6 min Shoulder Ext red 2x10 Rows red 2x10 Seated HS curls green 2x10 each LAQ 2lb 2x10  Seated  march 2lb 2x10  06/04/24- EVAL                                                                                                                                 PATIENT EDUCATION:  Education details: POC Person educated: Patient Education method: Explanation Education comprehension: verbalized understanding  HOME EXERCISE PROGRAM: Access Code: [email or web address] URL: [email or web address] Date: 06/25/2024 Prepared by: Tanda Sorrow  Exercises - Supine Bridge  - 1 x daily - 7 x weekly - 3 sets - 10 reps - Standing Shoulder Extension with Resistance  - 1 x daily - 7 x weekly - 3 sets - 10 reps - Standing Row with Resistance  - 1 x daily - 7 x weekly - 3 sets - 10 reps  ASSESSMENT:  CLINICAL IMPRESSION: Assessed and documented all goals- see goals below for specifics. Preformed and issued HEP. Issued handout and needed moderate cuing. Stressed importance of compliance and she VU.Struggled with RT thigh pain limited standing ex and walking without AD.    OBJECTIVE IMPAIRMENTS: Abnormal gait, decreased activity tolerance, decreased balance, difficulty walking, improper body mechanics, postural dysfunction, and pain.   ACTIVITY LIMITATIONS: carrying, lifting, bending, squatting, stairs, and locomotion level  PARTICIPATION LIMITATIONS: cleaning, laundry, shopping, community activity, occupation, and yard work  PERSONAL FACTORS: Age, Fitness, Past/current  experiences, Time since onset of injury/illness/exacerbation, and Transportation are also affecting patient's functional outcome.   REHAB POTENTIAL: Good  CLINICAL DECISION MAKING: Stable/uncomplicated  EVALUATION COMPLEXITY: Low   GOALS: Goals reviewed with patient? Yes  SHORT TERM GOALS: Target date: 07/16/24  Patient will be independent with initial HEP. Baseline:  Goal status: IN PROGRESS 07/05/24  MET 08/02/24  2.  Patient will demonstrate decreased fall risk by scoring <15 sec on TUG. Baseline: 24s Goal status: IN PROGRESS 15s w/rollator, 18s w/o 07/12/24 08/02/24 with 4WW  12.93sec   without  17.28  MET   LONG TERM GOALS: Target date: 08/27/24  Patient will be independent with advanced/ongoing HEP to improve outcomes and carryover.  Baseline:  Goal status: INITIAL  2.  Patient will be able to ambulate 500' with LRAD with good safety to access community.  Baseline: using rollator  Goal status: INITIAL  3.  Patient will reports 50% improvement in leg pain Baseline:  Goal status: INITIAL   4.  Patient will demonstrate improved functional LE strength as demonstrated by 5xSTS <15s. Baseline: 23s Goal status: IN PROGRESS 21s 07/05/24  08/02/24  16.8 sec  5.  Patient will complete 2 min walk test without assistive device in <1 min 30s Baseline:  Goal status: 08/02/24 without AD 1 min 9 sec CGA with instability as RT  thigh pian increased and increased fwd flexed trunk   PLAN:  PT FREQUENCY: 2x/week  PT DURATION: 12 weeks  PLANNED INTERVENTIONS: 97110-Therapeutic exercises, 97530- Therapeutic activity, [id number]- Neuromuscular re-education, 97535- Self Care, [id number]- Manual therapy, [id number]- Gait training, [phone]- Electrical stimulation (manual), [phone] (1-2 muscles), [phone] (3+ muscles)- Dry Needling, Patient/Family education, Balance training, Stair training, Taping, Joint mobilization, Spinal mobilization, Cryotherapy, and Moist heat.  PLAN FOR NEXT SESSION: check HEP  compliance. Will HOLD after next week until after spinal inj 11/24 and MD follow .   Jamiyah Dingley,ANGIE, PTA 08/02/2024, 5:04 PM

## 2024-08-06 ENCOUNTER — Ambulatory Visit

## 2024-08-06 DIAGNOSIS — R262 Difficulty in walking, not elsewhere classified: Secondary | ICD-10-CM | POA: Diagnosis not present

## 2024-08-06 DIAGNOSIS — M6281 Muscle weakness (generalized): Secondary | ICD-10-CM

## 2024-08-06 DIAGNOSIS — R2681 Unsteadiness on feet: Secondary | ICD-10-CM

## 2024-08-06 DIAGNOSIS — R278 Other lack of coordination: Secondary | ICD-10-CM

## 2024-08-06 DIAGNOSIS — R252 Cramp and spasm: Secondary | ICD-10-CM

## 2024-08-06 NOTE — Therapy (Signed)
 OUTPATIENT PHYSICAL THERAPY THORACOLUMBAR TREATMENT  Progress Note Reporting Period 06/04/24 to 08/06/24  See note below for Objective Data and Assessment of Progress/Goals.     Patient Name: Olivia Shah MRN: [id number] DOB:05-Jun-1956, 68 y.o., female Today's Date: 08/06/2024  END OF SESSION:  PT End of Session - 08/06/24 1050     Visit Number 10    Date for Recertification  08/27/24    Authorization Type Medicare    PT Start Time 1050    PT Stop Time 1135    PT Time Calculation (min) 45 min                 Past Medical History:  Diagnosis Date   Arthritis    Diabetes mellitus without complication (HCC)    Hypertension    Past Surgical History:  Procedure Laterality Date   APPLICATION OF CRANIAL NAVIGATION N/A 05/18/2021   Procedure: APPLICATION OF CRANIAL NAVIGATION;  Surgeon: Cheryle Debby LABOR, MD;  Location: [facility] OR;  Service: Neurosurgery;  Laterality: N/A;   BREAST BIOPSY Right    CHOLECYSTECTOMY     CRANIOTOMY Right 05/18/2021   Procedure: Right craniotomy for tumor resection;  Surgeon: Cheryle Debby LABOR, MD;  Location: [facility] OR;  Service: Neurosurgery;  Laterality: Right;   EYE SURGERY     as a child   HERNIA REPAIR     Patient Active Problem List   Diagnosis Date Noted   Abnormal findings on diagnostic imaging of other specified body structures 01/14/2022   Chronic pain 01/14/2022   Colon cancer screening 01/14/2022   Dysphagia 01/14/2022   Family history of malignant neoplasm of digestive organs 01/14/2022   Hyperglycemia due to type 2 diabetes mellitus (HCC) 01/14/2022   Hypertension 01/14/2022   Meralgia paresthetica 01/14/2022   Mixed hyperlipidemia 01/14/2022   Morbid obesity (HCC) 01/14/2022   Overweight 01/14/2022   Pure hypercholesterolemia 01/14/2022   Spinal cord disease (HCC) 01/14/2022   Chronic cough 12/01/2021   Diabetes (HCC) 07/08/2021   Glaucoma 07/08/2021   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   Visual field defect of left eye  07/08/2021   Debility 06/16/2021   Hypokalemia    Seizures (HCC)    Benign essential HTN    Controlled type 2 diabetes mellitus with hyperglycemia, without long-term current use of insulin  (HCC)    Stridor 06/05/2021   Status post tracheostomy (HCC)    Acute respiratory failure with hypoxia (HCC)    Status epilepticus (HCC)    Brain tumor (HCC) 05/18/2021    PCP: Tanda Bame  REFERRING PROVIDER: Arley Helling  REFERRING DIAG: spinal stenosis, lumbar region with neurogenic claudication   Rationale for Evaluation and Treatment: Rehabilitation  THERAPY DIAG:  Difficulty in walking, not elsewhere classified  Other lack of coordination  Cramp and spasm  Muscle weakness (generalized)  Unsteadiness on feet  ONSET DATE: chronic  SUBJECTIVE:  SUBJECTIVE STATEMENT: Just alright, still having pain the R leg. Today it is just the right one.   I got a shot August 18th and it lasted about a week but I still have pain in the legs. I am using the rollator outside the house and inside the work building. But inside the house using the cane and try to walk on her own. The rollator supports my back pretty good.   PERTINENT HISTORY:  See above  PAIN:  Are you having pain? Yes: NPRS scale: 5/10 Pain location: in my legs  Pain description: achy, dull, nagging  Aggravating factors: cold weather, standing and walking long periods  Relieving factors: Tylenol   PRECAUTIONS: Fall  RED FLAGS: None   WEIGHT BEARING RESTRICTIONS: No  FALLS:  Has patient fallen in last 6 months? 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  DIAGNOSTIC FINDINGS:  N/A  COGNITION: Overall cognitive status: Within functional limits for tasks assessed     SENSATION: WFL  POSTURE: rounded shoulders, forward head, and flexed trunk    LUMBAR ROM: *all the pain is in her legs*  AROM eval  Flexion Can touch floor but pain in legs  Extension 25% with pain in leg  Right lateral flexion WFL  Left lateral flexion WFL  Right rotation 50% with pain  Left rotation 50% with pain   (Blank rows = not tested)  LOWER EXTREMITY ROM:  WFL   LOWER EXTREMITY MMT:  4+/5 BLE   FUNCTIONAL TESTS:  5 times sit to stand: 23s Timed up and go (TUG): 24s  GAIT: Distance walked: in clinic distances Assistive device utilized: Environmental Consultant - 4 wheeled Level of assistance: Complete Independence Comments: able to walk without AD but unsteady, antalgic, and slowed cadence and speed  TREATMENT DATE:  08/06/24 NuStep L5x15mins  Standing march 3# 2x10 Standing hip abd 3# 2x10 5xSTS 16s  STS with yellow ball 2x10  Walking with weight on rollator 15# 349ft  Standing shoulder ext and rows 2x10  08/02/24 Nustep L 5 Assessed goals Performed and issued ACCESS CODE; RZQVFQDR Standing red tband shld ext and row 2 sets 10- cued to stand up tall Wt ball seated core ex- OH and rotation   07/19/24 Side steps over obstacle with HHA  3# walking marches forwards and backwards  NuStep L5x75mins Leg ext 10# 2x10 HS curls 25# 2x10 STS holding yellow ball 2x10  07/12/24 NuStep L5x31mins Calf stretch on bar 20s x2  Calf raises 2x10 Toe raises 2x10 2HHA walking for speed 97ft x2 Step ups 4 Supine LE stretching Feet on pball rotations and knees to chest x10 Supine hip add with ball x10, then tried with  bridge but unable to do   07/09/24 NuStep L5x45mins  Step taps 4 4 step ups Side steps on beam STS with OHP red press 2x10 Standing march 5# to 6  Seated taps to 6 with 5# to the side   07/05/24 NuStep L5x25mins Leg ext 5# 2x10 HS curls 20# 2x10 Walking without rollator ~32ft x2 STS with yellow ball x10 5xSTS TUG   07/02/24  LAQ 3# 2x12 Standing march and hip abd 3# 2x10 HS curls green 2x10 Shoulder ext red 2x10 Standing rows red 2x10 NuStep L5x73mins    06/28/24 NuStep L5x29mins  Rotations with green band x10 STS 2x8 Standing march Supine feet on pball rotations and knees to chest LTR with band x10 Ball squeeze 2x10 Shoulder ext red 2x12   06/25/28 NuStep L 5 x 6 min Shoulder Ext red 2x10 Rows red 2x10 Seated HS curls green 2x10 each LAQ 2lb 2x10  Seated  march 2lb 2x10  06/04/24- EVAL                                                                                                                                 PATIENT EDUCATION:  Education details: POC Person educated: Patient Education method: Explanation Education comprehension: verbalized understanding  HOME EXERCISE PROGRAM: Access Code: [email or web address] URL: [email or web address] Date: 06/25/2024 Prepared by: Tanda Sorrow  Exercises - Supine Bridge  - 1 x daily - 7 x weekly - 3 sets - 10 reps - Standing Shoulder Extension with Resistance  - 1 x daily - 7 x weekly - 3 sets - 10 reps - Standing Row with Resistance  - 1 x daily - 7 x weekly - 3 sets - 10 reps  ASSESSMENT:  CLINICAL IMPRESSION: Assessed and documented goals for progress note. She reports ongoing pain in the RLE, with no change since starting PT. She reports her back pain is fine as long as she is not standing up straight. The pain in the legs is mostly when she is walking. Sometimes she feels it when sitting, but if she sits on a hard surface she is okay. Pt reports some compliance with HEP over the weekend and will try to  do them more often this week.    OBJECTIVE IMPAIRMENTS: Abnormal gait, decreased activity tolerance, decreased balance, difficulty walking, improper body mechanics, postural dysfunction, and pain.   ACTIVITY LIMITATIONS: carrying, lifting, bending, squatting, stairs, and locomotion level  PARTICIPATION LIMITATIONS: cleaning, laundry, shopping, community activity, occupation, and yard work  PERSONAL FACTORS: Age, Fitness, Past/current experiences, Time since onset of injury/illness/exacerbation, and Transportation are also affecting patient's functional outcome.   REHAB POTENTIAL: Good  CLINICAL DECISION MAKING: Stable/uncomplicated  EVALUATION COMPLEXITY: Low   GOALS: Goals reviewed with patient? Yes  SHORT TERM GOALS: Target date: 07/16/24  Patient will be independent with initial HEP. Baseline:  Goal status: IN PROGRESS 07/05/24  MET 08/02/24  2.  Patient will demonstrate decreased fall risk by scoring <15 sec on TUG. Baseline: 24s Goal status: IN PROGRESS 15s w/rollator, 18s w/o 07/12/24 08/02/24 with 4WW  12.93sec   without  17.28  MET   LONG TERM GOALS: Target date: 08/27/24  Patient will be independent with advanced/ongoing HEP to improve outcomes and carryover.  Baseline:  Goal status: INITIAL  2.  Patient will be able to ambulate 500' with LRAD with good safety to access community.  Baseline:  using rollator  Goal status: IN PROGRESS 08/06/24  3.  Patient will reports 50% improvement in leg pain Baseline:  Goal status: ongoing no change in pain 08/06/24   4.  Patient will demonstrate improved functional LE strength as demonstrated by 5xSTS <15s. Baseline: 23s Goal status: IN PROGRESS 21s 07/05/24  08/02/24  16.8 sec, 16s 08/06/24  5.  Patient will complete 2 min walk test without assistive device  Baseline:  Goal status: 08/02/24 without AD 1 min 9 sec CGA with instability as RT thigh pian increased and increased fwd flexed trunk ONGOING    PLAN:  PT  FREQUENCY: 2x/week  PT DURATION: 12 weeks  PLANNED INTERVENTIONS: 97110-Therapeutic exercises, 97530- Therapeutic activity, 97112- Neuromuscular re-education, 97535- Self Care, 02859- Manual therapy, [id number]- Gait training, [phone]- Electrical stimulation (manual), [phone] (1-2 muscles), [phone] (3+ muscles)- Dry Needling, Patient/Family education, Balance training, Stair training, Taping, Joint mobilization, Spinal mobilization, Cryotherapy, and Moist heat.  PLAN FOR NEXT SESSION: Will hold after next week until after spinal injection on 11/24 and MD follow up   Almetta Fam, PT 08/06/2024, 11:35 AM

## 2024-08-09 ENCOUNTER — Ambulatory Visit: Admitting: Physical Therapy

## 2024-08-09 DIAGNOSIS — R262 Difficulty in walking, not elsewhere classified: Secondary | ICD-10-CM | POA: Diagnosis not present

## 2024-08-09 DIAGNOSIS — R252 Cramp and spasm: Secondary | ICD-10-CM

## 2024-08-09 DIAGNOSIS — M6281 Muscle weakness (generalized): Secondary | ICD-10-CM

## 2024-08-09 DIAGNOSIS — R278 Other lack of coordination: Secondary | ICD-10-CM

## 2024-08-09 NOTE — Therapy (Signed)
 OUTPATIENT PHYSICAL THERAPY THORACOLUMBAR TREATMENT     Patient Name: Olivia Shah MRN: [id number] DOB:23-Jul-1956, 68 y.o., female Today's Date: 08/09/2024  END OF SESSION:  PT End of Session - 08/09/24 1700     Visit Number 11    Date for Recertification  08/27/24    Authorization Type Medicare    PT Start Time 1700    PT Stop Time 1745    PT Time Calculation (min) 45 min                 Past Medical History:  Diagnosis Date   Arthritis    Diabetes mellitus without complication (HCC)    Hypertension    Past Surgical History:  Procedure Laterality Date   APPLICATION OF CRANIAL NAVIGATION N/A 05/18/2021   Procedure: APPLICATION OF CRANIAL NAVIGATION;  Surgeon: Cheryle Debby LABOR, MD;  Location: [facility] OR;  Service: Neurosurgery;  Laterality: N/A;   BREAST BIOPSY Right    CHOLECYSTECTOMY     CRANIOTOMY Right 05/18/2021   Procedure: Right craniotomy for tumor resection;  Surgeon: Cheryle Debby LABOR, MD;  Location: [facility] OR;  Service: Neurosurgery;  Laterality: Right;   EYE SURGERY     as a child   HERNIA REPAIR     Patient Active Problem List   Diagnosis Date Noted   Abnormal findings on diagnostic imaging of other specified body structures 01/14/2022   Chronic pain 01/14/2022   Colon cancer screening 01/14/2022   Dysphagia 01/14/2022   Family history of malignant neoplasm of digestive organs 01/14/2022   Hyperglycemia due to type 2 diabetes mellitus (HCC) 01/14/2022   Hypertension 01/14/2022   Meralgia paresthetica 01/14/2022   Mixed hyperlipidemia 01/14/2022   Morbid obesity (HCC) 01/14/2022   Overweight 01/14/2022   Pure hypercholesterolemia 01/14/2022   Spinal cord disease (HCC) 01/14/2022   Chronic cough 12/01/2021   Diabetes (HCC) 07/08/2021   Glaucoma 07/08/2021   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   Visual field defect of left eye 07/08/2021   Debility 06/16/2021   Hypokalemia    Seizures (HCC)    Benign essential HTN    Controlled type 2 diabetes  mellitus with hyperglycemia, without long-term current use of insulin  (HCC)    Stridor 06/05/2021   Status post tracheostomy (HCC)    Acute respiratory failure with hypoxia (HCC)    Status epilepticus (HCC)    Brain tumor (HCC) 05/18/2021    PCP: Tanda Bame  REFERRING PROVIDER: Arley Helling  REFERRING DIAG: spinal stenosis, lumbar region with neurogenic claudication   Rationale for Evaluation and Treatment: Rehabilitation  THERAPY DIAG:  Difficulty in walking, not elsewhere classified  Other lack of coordination  Cramp and spasm  Muscle weakness (generalized)  ONSET DATE: chronic  SUBJECTIVE:  SUBJECTIVE STATEMENT: Legs are hurting- took some advil I got a shot August 18th and it lasted about a week but I still have pain in the legs. I am using the rollator outside the house and inside the work building. But inside the house using the cane and try to walk on her own. The rollator supports my back pretty good.   PERTINENT HISTORY:  See above  PAIN:  Are you having pain? Yes: NPRS scale: 5/10 Pain location: in my legs  Pain description: achy, dull, nagging  Aggravating factors: cold weather, standing and walking long periods  Relieving factors: Tylenol   PRECAUTIONS: Fall  RED FLAGS: None   WEIGHT BEARING RESTRICTIONS: No  FALLS:  Has patient fallen in last 6 months? 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  DIAGNOSTIC FINDINGS:  N/A  COGNITION: Overall cognitive status: Within functional limits for tasks assessed     SENSATION: WFL  POSTURE: rounded shoulders, forward head, and flexed trunk    LUMBAR ROM: *all the pain is in her legs*  AROM eval  Flexion Can touch floor but pain in legs  Extension 25% with pain in leg  Right lateral flexion WFL  Left lateral flexion WFL  Right rotation 50% with pain  Left rotation 50% with pain   (Blank rows = not tested)  LOWER EXTREMITY ROM:  WFL   LOWER EXTREMITY MMT:  4+/5 BLE   FUNCTIONAL TESTS:  5 times sit to stand: 23s Timed up and go (TUG): 24s  GAIT: Distance walked: in clinic distances Assistive device utilized: Environmental Consultant - 4 wheeled Level of assistance: Complete Independence Comments: able to walk without AD but unsteady, antalgic, and slowed cadence and speed  TREATMENT DATE:   08/09/24 Nustep L 5 3# LAQ 2 set 12 3# hip flex into  abd lifting onto 6 inch box 2 sets 10 BIL Green tband HS curls, hip flex and clams 2 sets 10 STS with yellow ball 2x10  Seated resisted trunk flex and ext 20x Standing shoulder ext and rows 2x10 tband     08/06/24 NuStep L5x43mins  Standing march 3# 2x10 Standing hip abd 3# 2x10 5xSTS 16s  STS with yellow ball 2x10  Walking with weight on rollator 15# 337ft  Standing shoulder ext and rows 2x10  08/02/24 Nustep L 5 Assessed goals Performed and issued ACCESS CODE; [id number] Standing red tband shld ext and row 2 sets 10- cued to stand up tall Wt ball seated core ex- OH and rotation   07/19/24 Side steps over obstacle with HHA  3# walking marches forwards and backwards  NuStep L5x53mins Leg ext 10# 2x10 HS curls 25# 2x10 STS holding yellow ball 2x10  07/12/24 NuStep L5x31mins Calf stretch on bar 20s x2  Calf raises 2x10 Toe raises 2x10 2HHA walking for speed 64ft x2 Step ups 4 Supine LE stretching Feet on  pball rotations and knees to chest x10 Supine hip add with ball x10, then tried with bridge but unable to do   07/09/24 NuStep L5x43mins  Step taps 4 4 step ups Side steps on  beam STS with OHP red press 2x10 Standing march 5# to 6  Seated taps to 6 with 5# to the side   07/05/24 NuStep L5x19mins Leg ext 5# 2x10 HS curls 20# 2x10 Walking without rollator ~68ft x2 STS with yellow ball x10 5xSTS TUG   07/02/24 LAQ 3# 2x12 Standing march and hip abd 3# 2x10 HS curls green 2x10 Shoulder ext red 2x10 Standing rows red 2x10 NuStep L5x41mins    06/28/24 NuStep L5x65mins  Rotations with green band x10 STS 2x8 Standing march Supine feet on pball rotations and knees to chest LTR with band x10 Ball squeeze 2x10 Shoulder ext red 2x12   06/25/28 NuStep L 5 x 6 min Shoulder Ext red 2x10 Rows red 2x10 Seated HS curls green 2x10 each LAQ 2lb 2x10  Seated  march 2lb 2x10  06/04/24- EVAL                                                                                                                                 PATIENT EDUCATION:  Education details: POC Person educated: Patient Education method: Explanation Education comprehension: verbalized understanding  HOME EXERCISE PROGRAM: Access Code: [email or web address] URL: [email or web address] Date: 06/25/2024 Prepared by: Tanda Sorrow  Exercises - Supine Bridge  - 1 x daily - 7 x weekly - 3 sets - 10 reps - Standing Shoulder Extension with Resistance  - 1 x daily - 7 x weekly - 3 sets - 10 reps - Standing Row with Resistance  - 1 x daily - 7 x weekly - 3 sets - 10 reps  ASSESSMENT:  CLINICAL IMPRESSION: Arrives with increased lag pain so focused more ex in sitting, pnt states pain moves from leg to leg and varies each day. Will HOLD until after spinal inj and MD f/u   OBJECTIVE IMPAIRMENTS: Abnormal gait, decreased activity tolerance, decreased balance, difficulty walking, improper body mechanics, postural  dysfunction, and pain.   ACTIVITY LIMITATIONS: carrying, lifting, bending, squatting, stairs, and locomotion level  PARTICIPATION LIMITATIONS: cleaning, laundry, shopping, community activity, occupation, and yard work  PERSONAL FACTORS: Age, Fitness, Past/current experiences, Time since onset of injury/illness/exacerbation, and Transportation are also affecting patient's functional outcome.   REHAB POTENTIAL: Good  CLINICAL DECISION MAKING: Stable/uncomplicated  EVALUATION COMPLEXITY: Low   GOALS: Goals reviewed with patient? Yes  SHORT TERM GOALS: Target date: 07/16/24  Patient will be independent with initial HEP. Baseline:  Goal status: IN PROGRESS 07/05/24  MET 08/02/24  2.  Patient will demonstrate decreased fall risk by scoring <15 sec on TUG. Baseline: 24s Goal status: IN PROGRESS 15s w/rollator, 18s w/o 07/12/24 08/02/24 with 4WW  12.93sec   without  17.28  MET   LONG TERM GOALS: Target date: 08/27/24  Patient will be independent with advanced/ongoing HEP to improve outcomes and carryover.  Baseline:  Goal status: INITIAL  2.  Patient will be able to ambulate 500' with LRAD with good safety to access community.  Baseline: using rollator  Goal status: IN PROGRESS 08/06/24  3.  Patient will reports 50% improvement in leg pain Baseline:  Goal status: ongoing no change in pain 08/06/24   4.  Patient will demonstrate improved functional LE strength as demonstrated by 5xSTS <15s. Baseline: 23s Goal status: IN PROGRESS 21s 07/05/24  08/02/24  16.8 sec, 16s 08/06/24  5.  Patient will complete 2 min walk test without assistive device  Baseline:  Goal status: 08/02/24 without AD 1 min 9 sec CGA with instability as RT thigh pian increased and increased fwd flexed trunk ONGOING    PLAN:  PT FREQUENCY: 2x/week  PT DURATION: 12 weeks  PLANNED INTERVENTIONS: 97110-Therapeutic exercises, 97530- Therapeutic activity, 97112- Neuromuscular re-education, 97535- Self Care,  [id number]- Manual therapy, [id number]- Gait training, [id number]- Electrical stimulation (manual), [phone] (1-2 muscles), [id number] (3+ muscles)- Dry Needling, Patient/Family education, Balance training, Stair training, Taping, Joint mobilization, Spinal mobilization, Cryotherapy, and Moist heat.  PLAN FOR NEXT SESSION: HOLD until after spinal injection on 11/24 and MD follow up   Tyriek Hofman,ANGIE, PTA 08/09/2024, 5:01 PM

## 2024-10-29 ENCOUNTER — Ambulatory Visit

## 2024-11-12 ENCOUNTER — Ambulatory Visit
